# Patient Record
Sex: Female | Born: 1955 | Race: White | Hispanic: No | Marital: Married | Smoking: Current every day smoker
Health system: Southern US, Community
[De-identification: ages and names within clinical notes are randomized; demographics above are authoritative.]

## PROBLEM LIST (undated history)

## (undated) DIAGNOSIS — Z5189 Encounter for other specified aftercare: Secondary | ICD-10-CM

## (undated) DIAGNOSIS — F17200 Nicotine dependence, unspecified, uncomplicated: Secondary | ICD-10-CM

## (undated) DIAGNOSIS — M519 Unspecified thoracic, thoracolumbar and lumbosacral intervertebral disc disorder: Secondary | ICD-10-CM

## (undated) DIAGNOSIS — M5090 Cervical disc disorder, unspecified, unspecified cervical region: Secondary | ICD-10-CM

## (undated) DIAGNOSIS — G43909 Migraine, unspecified, not intractable, without status migrainosus: Secondary | ICD-10-CM

## (undated) DIAGNOSIS — J449 Chronic obstructive pulmonary disease, unspecified: Secondary | ICD-10-CM

## (undated) DIAGNOSIS — I509 Heart failure, unspecified: Secondary | ICD-10-CM

## (undated) DIAGNOSIS — N390 Urinary tract infection, site not specified: Secondary | ICD-10-CM

## (undated) DIAGNOSIS — C801 Malignant (primary) neoplasm, unspecified: Secondary | ICD-10-CM

## (undated) DIAGNOSIS — M1990 Unspecified osteoarthritis, unspecified site: Secondary | ICD-10-CM

## (undated) DIAGNOSIS — D649 Anemia, unspecified: Secondary | ICD-10-CM

## (undated) HISTORY — DX: Cervical disc disorder, unspecified, unspecified cervical region: M50.90

## (undated) HISTORY — DX: Unspecified thoracic, thoracolumbar and lumbosacral intervertebral disc disorder: M51.9

## (undated) HISTORY — DX: Urinary tract infection, site not specified: N39.0

## (undated) HISTORY — DX: Malignant (primary) neoplasm, unspecified: C80.1

## (undated) HISTORY — DX: Nicotine dependence, unspecified, uncomplicated: F17.200

## (undated) HISTORY — PX: TUBAL LIGATION: SHX77

## (undated) HISTORY — DX: Unspecified osteoarthritis, unspecified site: M19.90

## (undated) HISTORY — PX: OTHER SURGICAL HISTORY: SHX169

## (undated) HISTORY — DX: Chronic obstructive pulmonary disease, unspecified: J44.9

## (undated) HISTORY — PX: COLONOSCOPY: SHX174

## (undated) HISTORY — DX: Migraine, unspecified, not intractable, without status migrainosus: G43.909

## (undated) HISTORY — DX: Encounter for other specified aftercare: Z51.89

## (undated) HISTORY — DX: Anemia, unspecified: D64.9

---
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
## 1984-11-26 HISTORY — PX: ABDOMINAL HYSTERECTOMY: SHX81

## 1991-11-27 HISTORY — PX: CERVICAL DISCECTOMY: SHX98

## 2009-09-21 ENCOUNTER — Ambulatory Visit: Payer: Self-pay | Admitting: Physician Assistant

## 2012-01-04 DIAGNOSIS — Z5181 Encounter for therapeutic drug level monitoring: Secondary | ICD-10-CM | POA: Diagnosis not present

## 2012-01-04 DIAGNOSIS — M961 Postlaminectomy syndrome, not elsewhere classified: Secondary | ICD-10-CM | POA: Diagnosis not present

## 2012-01-04 DIAGNOSIS — IMO0002 Reserved for concepts with insufficient information to code with codable children: Secondary | ICD-10-CM | POA: Diagnosis not present

## 2012-01-04 DIAGNOSIS — Z79899 Other long term (current) drug therapy: Secondary | ICD-10-CM | POA: Diagnosis not present

## 2012-02-29 DIAGNOSIS — M961 Postlaminectomy syndrome, not elsewhere classified: Secondary | ICD-10-CM | POA: Diagnosis not present

## 2012-04-30 DIAGNOSIS — M961 Postlaminectomy syndrome, not elsewhere classified: Secondary | ICD-10-CM | POA: Diagnosis not present

## 2012-04-30 DIAGNOSIS — IMO0002 Reserved for concepts with insufficient information to code with codable children: Secondary | ICD-10-CM | POA: Diagnosis not present

## 2012-06-02 DIAGNOSIS — G43719 Chronic migraine without aura, intractable, without status migrainosus: Secondary | ICD-10-CM | POA: Diagnosis not present

## 2012-06-02 DIAGNOSIS — R259 Unspecified abnormal involuntary movements: Secondary | ICD-10-CM | POA: Diagnosis not present

## 2012-06-02 DIAGNOSIS — M5412 Radiculopathy, cervical region: Secondary | ICD-10-CM | POA: Diagnosis not present

## 2012-06-02 DIAGNOSIS — R209 Unspecified disturbances of skin sensation: Secondary | ICD-10-CM | POA: Diagnosis not present

## 2012-06-06 DIAGNOSIS — M5020 Other cervical disc displacement, unspecified cervical region: Secondary | ICD-10-CM | POA: Diagnosis not present

## 2012-06-24 DIAGNOSIS — M961 Postlaminectomy syndrome, not elsewhere classified: Secondary | ICD-10-CM | POA: Diagnosis not present

## 2012-06-24 DIAGNOSIS — Z79899 Other long term (current) drug therapy: Secondary | ICD-10-CM | POA: Diagnosis not present

## 2012-06-24 DIAGNOSIS — Z5181 Encounter for therapeutic drug level monitoring: Secondary | ICD-10-CM | POA: Diagnosis not present

## 2012-06-24 DIAGNOSIS — IMO0002 Reserved for concepts with insufficient information to code with codable children: Secondary | ICD-10-CM | POA: Diagnosis not present

## 2012-07-01 DIAGNOSIS — R209 Unspecified disturbances of skin sensation: Secondary | ICD-10-CM | POA: Diagnosis not present

## 2012-07-01 DIAGNOSIS — M5412 Radiculopathy, cervical region: Secondary | ICD-10-CM | POA: Diagnosis not present

## 2012-07-01 DIAGNOSIS — R259 Unspecified abnormal involuntary movements: Secondary | ICD-10-CM | POA: Diagnosis not present

## 2012-07-01 DIAGNOSIS — G43719 Chronic migraine without aura, intractable, without status migrainosus: Secondary | ICD-10-CM | POA: Diagnosis not present

## 2012-08-22 DIAGNOSIS — M67919 Unspecified disorder of synovium and tendon, unspecified shoulder: Secondary | ICD-10-CM | POA: Diagnosis not present

## 2012-08-22 DIAGNOSIS — R894 Abnormal immunological findings in specimens from other organs, systems and tissues: Secondary | ICD-10-CM | POA: Diagnosis not present

## 2012-08-22 DIAGNOSIS — M719 Bursopathy, unspecified: Secondary | ICD-10-CM | POA: Diagnosis not present

## 2012-08-22 DIAGNOSIS — R209 Unspecified disturbances of skin sensation: Secondary | ICD-10-CM | POA: Diagnosis not present

## 2012-08-22 DIAGNOSIS — Z23 Encounter for immunization: Secondary | ICD-10-CM | POA: Diagnosis not present

## 2012-08-22 DIAGNOSIS — R259 Unspecified abnormal involuntary movements: Secondary | ICD-10-CM | POA: Diagnosis not present

## 2012-08-25 DIAGNOSIS — M961 Postlaminectomy syndrome, not elsewhere classified: Secondary | ICD-10-CM | POA: Diagnosis not present

## 2012-08-25 DIAGNOSIS — IMO0002 Reserved for concepts with insufficient information to code with codable children: Secondary | ICD-10-CM | POA: Diagnosis not present

## 2012-10-01 DIAGNOSIS — G250 Essential tremor: Secondary | ICD-10-CM | POA: Diagnosis not present

## 2012-10-01 DIAGNOSIS — G252 Other specified forms of tremor: Secondary | ICD-10-CM | POA: Diagnosis not present

## 2012-10-20 DIAGNOSIS — M961 Postlaminectomy syndrome, not elsewhere classified: Secondary | ICD-10-CM | POA: Diagnosis not present

## 2012-10-20 DIAGNOSIS — IMO0002 Reserved for concepts with insufficient information to code with codable children: Secondary | ICD-10-CM | POA: Diagnosis not present

## 2012-10-29 DIAGNOSIS — IMO0002 Reserved for concepts with insufficient information to code with codable children: Secondary | ICD-10-CM | POA: Diagnosis not present

## 2012-10-29 DIAGNOSIS — Z885 Allergy status to narcotic agent status: Secondary | ICD-10-CM | POA: Diagnosis not present

## 2012-10-29 DIAGNOSIS — Z79899 Other long term (current) drug therapy: Secondary | ICD-10-CM | POA: Diagnosis not present

## 2012-10-29 DIAGNOSIS — Z888 Allergy status to other drugs, medicaments and biological substances status: Secondary | ICD-10-CM | POA: Diagnosis not present

## 2012-10-29 DIAGNOSIS — I10 Essential (primary) hypertension: Secondary | ICD-10-CM | POA: Diagnosis not present

## 2012-10-29 DIAGNOSIS — Z880 Allergy status to penicillin: Secondary | ICD-10-CM | POA: Diagnosis not present

## 2012-10-29 DIAGNOSIS — M5137 Other intervertebral disc degeneration, lumbosacral region: Secondary | ICD-10-CM | POA: Diagnosis not present

## 2012-12-15 DIAGNOSIS — M545 Low back pain: Secondary | ICD-10-CM | POA: Diagnosis not present

## 2012-12-15 DIAGNOSIS — M961 Postlaminectomy syndrome, not elsewhere classified: Secondary | ICD-10-CM | POA: Diagnosis not present

## 2012-12-15 DIAGNOSIS — IMO0002 Reserved for concepts with insufficient information to code with codable children: Secondary | ICD-10-CM | POA: Diagnosis not present

## 2013-02-09 DIAGNOSIS — Z79899 Other long term (current) drug therapy: Secondary | ICD-10-CM | POA: Diagnosis not present

## 2013-02-09 DIAGNOSIS — M961 Postlaminectomy syndrome, not elsewhere classified: Secondary | ICD-10-CM | POA: Diagnosis not present

## 2013-04-06 DIAGNOSIS — R209 Unspecified disturbances of skin sensation: Secondary | ICD-10-CM | POA: Diagnosis not present

## 2013-04-06 DIAGNOSIS — R259 Unspecified abnormal involuntary movements: Secondary | ICD-10-CM | POA: Diagnosis not present

## 2013-04-06 DIAGNOSIS — M5412 Radiculopathy, cervical region: Secondary | ICD-10-CM | POA: Diagnosis not present

## 2013-04-07 DIAGNOSIS — IMO0002 Reserved for concepts with insufficient information to code with codable children: Secondary | ICD-10-CM | POA: Diagnosis not present

## 2013-04-07 DIAGNOSIS — M5030 Other cervical disc degeneration, unspecified cervical region: Secondary | ICD-10-CM | POA: Diagnosis not present

## 2013-04-07 DIAGNOSIS — M961 Postlaminectomy syndrome, not elsewhere classified: Secondary | ICD-10-CM | POA: Diagnosis not present

## 2013-04-07 DIAGNOSIS — M545 Low back pain: Secondary | ICD-10-CM | POA: Diagnosis not present

## 2013-04-27 DIAGNOSIS — M5412 Radiculopathy, cervical region: Secondary | ICD-10-CM | POA: Diagnosis not present

## 2013-04-30 DIAGNOSIS — Z888 Allergy status to other drugs, medicaments and biological substances status: Secondary | ICD-10-CM | POA: Diagnosis not present

## 2013-04-30 DIAGNOSIS — Z880 Allergy status to penicillin: Secondary | ICD-10-CM | POA: Diagnosis not present

## 2013-04-30 DIAGNOSIS — IMO0002 Reserved for concepts with insufficient information to code with codable children: Secondary | ICD-10-CM | POA: Diagnosis not present

## 2013-04-30 DIAGNOSIS — M5030 Other cervical disc degeneration, unspecified cervical region: Secondary | ICD-10-CM | POA: Diagnosis not present

## 2013-04-30 DIAGNOSIS — M961 Postlaminectomy syndrome, not elsewhere classified: Secondary | ICD-10-CM | POA: Diagnosis not present

## 2013-06-02 DIAGNOSIS — M961 Postlaminectomy syndrome, not elsewhere classified: Secondary | ICD-10-CM | POA: Diagnosis not present

## 2013-06-02 DIAGNOSIS — M545 Low back pain: Secondary | ICD-10-CM | POA: Diagnosis not present

## 2013-06-02 DIAGNOSIS — M5030 Other cervical disc degeneration, unspecified cervical region: Secondary | ICD-10-CM | POA: Diagnosis not present

## 2013-06-02 DIAGNOSIS — E039 Hypothyroidism, unspecified: Secondary | ICD-10-CM | POA: Diagnosis not present

## 2013-06-12 DIAGNOSIS — R259 Unspecified abnormal involuntary movements: Secondary | ICD-10-CM | POA: Diagnosis not present

## 2013-06-12 DIAGNOSIS — E079 Disorder of thyroid, unspecified: Secondary | ICD-10-CM | POA: Diagnosis not present

## 2013-06-12 DIAGNOSIS — M5412 Radiculopathy, cervical region: Secondary | ICD-10-CM | POA: Diagnosis not present

## 2013-06-12 DIAGNOSIS — R209 Unspecified disturbances of skin sensation: Secondary | ICD-10-CM | POA: Diagnosis not present

## 2013-06-12 DIAGNOSIS — G43719 Chronic migraine without aura, intractable, without status migrainosus: Secondary | ICD-10-CM | POA: Diagnosis not present

## 2013-07-28 DIAGNOSIS — M5030 Other cervical disc degeneration, unspecified cervical region: Secondary | ICD-10-CM | POA: Diagnosis not present

## 2013-07-28 DIAGNOSIS — E039 Hypothyroidism, unspecified: Secondary | ICD-10-CM | POA: Diagnosis not present

## 2013-07-28 DIAGNOSIS — M961 Postlaminectomy syndrome, not elsewhere classified: Secondary | ICD-10-CM | POA: Diagnosis not present

## 2013-07-28 DIAGNOSIS — M545 Low back pain: Secondary | ICD-10-CM | POA: Diagnosis not present

## 2013-08-04 ENCOUNTER — Encounter: Payer: Self-pay | Admitting: Internal Medicine

## 2013-08-04 ENCOUNTER — Ambulatory Visit (INDEPENDENT_AMBULATORY_CARE_PROVIDER_SITE_OTHER): Payer: Medicare Other | Admitting: Internal Medicine

## 2013-08-04 VITALS — BP 120/88 | HR 111 | Temp 98.1°F | Ht 63.0 in | Wt 94.0 lb

## 2013-08-04 DIAGNOSIS — Z23 Encounter for immunization: Secondary | ICD-10-CM | POA: Diagnosis not present

## 2013-08-04 DIAGNOSIS — M5090 Cervical disc disorder, unspecified, unspecified cervical region: Secondary | ICD-10-CM

## 2013-08-04 DIAGNOSIS — F17200 Nicotine dependence, unspecified, uncomplicated: Secondary | ICD-10-CM

## 2013-08-04 DIAGNOSIS — G43909 Migraine, unspecified, not intractable, without status migrainosus: Secondary | ICD-10-CM | POA: Insufficient documentation

## 2013-08-04 DIAGNOSIS — M519 Unspecified thoracic, thoracolumbar and lumbosacral intervertebral disc disorder: Secondary | ICD-10-CM | POA: Insufficient documentation

## 2013-08-04 DIAGNOSIS — R634 Abnormal weight loss: Secondary | ICD-10-CM | POA: Diagnosis not present

## 2013-08-04 NOTE — Addendum Note (Signed)
Addended by: Sueanne Margarita on: 08/04/2013 02:23 PM   Modules accepted: Orders

## 2013-08-04 NOTE — Progress Notes (Signed)
Subjective:    Patient ID: Rachel Butler, female    DOB: October 08, 1956, 57 y.o.   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 Does other personal care  Is a smoker Is considering stopping Has feeling of swelling in feet Discussed stopping--husband also smokes  Weight has been going down Had been 115-125# in the past Down 8# in past month Thyroid has been checked 2 months ago Does have some GI symptoms--- history of gluten sensitivity in her family  History of migraines Diminished from the past Neurologist does follow her for this  No current outpatient prescriptions on file prior to visit.   No current facility-administered medications on file prior to visit.    Allergies  Allergen Reactions  . Endoxcin [Lidocaine-Menthol]   . Nubain [Nalbuphine Hcl]   . Penicillins   . Robaxin [Methocarbamol]     Past Medical History  Diagnosis Date  . Nicotine dependence   . Cervical disc disease   . Lumbar disc disease   . Migraines     Past Surgical History  Procedure Laterality Date  . Abdominal hysterectomy  1986    right tube and ovary removed--then laparoscopy after for ?retained cyst?  . Tonsillectomy and adenoidectomy  1970  . Cesarean section  [id number]  . Cervical discectomy  1993    C5-6    Family History  Problem Relation Age of Onset  . Cancer Mother   . COPD Father   . Heart disease Father   . Hypertension Father   . Thyroid disease Other   . Cancer Other     mother's side  . Heart disease Other     Dad's side    History   Social History  . Marital Status: Married    Spouse Name: N/A     Number of Children: 2  . Years of Education: N/A   Occupational History  . Company secretary ---retired   . 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 Smoking status: Current Every Day Smoker  . Smokeless tobacco: Never Used  . Alcohol Use: No  . Drug Use: No  . Sexual Activity: Not on file   Other Topics Concern  . Not on file   Social History Narrative  . No narrative on file   Review of Systems  Constitutional: Positive for unexpected weight change. Negative for fatigue.  HENT: Positive for congestion. Negative for hearing loss.        Full dentures  Respiratory: Negative for cough and shortness of breath.        Gets congested feeling in her chest--uses humidifier  Cardiovascular: Positive for palpitations. Negative for chest pain.       Notes racing heart ~4-5 PM. Brief and resolves on its own  Gastrointestinal: Positive for abdominal pain. Negative for constipation and blood in stool.       Gets bloating at times  Genitourinary: Negative for dysuria and difficulty urinating.       Does do breast exam---doesn't want mammogram Frequent UTIs in the past  Musculoskeletal: Positive for back pain and arthralgias.  Neurological: Positive for dizziness, weakness  and headaches. Negative for syncope.       Leg weakness if stands for a long time  Psychiatric/Behavioral: Negative for sleep disturbance and dysphoric mood. The patient is not nervous/anxious.        Objective:   Physical Exam  Constitutional: She is oriented to person, place, and time. She appears well-developed.  slim  HENT:  Mouth/Throat: Oropharynx is clear and moist. No oropharyngeal exudate.  Eyes: Conjunctivae and EOM are normal. Pupils are equal, round, and reactive to light.  Neck: Normal range of motion. Neck supple. No thyromegaly present.  Cardiovascular: Normal rate, regular rhythm, normal heart sounds and intact distal pulses.  Exam reveals no gallop.   No murmur  heard. Pulmonary/Chest: Effort normal and breath sounds normal. No respiratory distress. She has no wheezes. She has no rales.  Abdominal: Soft. There is no tenderness.  Genitourinary:  No breast masses or tenderness  Musculoskeletal: She exhibits no edema and no tenderness.  Lymphadenopathy:    She has no cervical adenopathy.    She has no axillary adenopathy.       Right: No inguinal adenopathy present.       Left: No inguinal adenopathy present.  Neurological: She is alert and oriented to person, place, and time.  Skin: No rash noted. No erythema.  Psychiatric: She has a normal mood and affect. Her behavior is normal.          Assessment & Plan:

## 2013-08-04 NOTE — Assessment & Plan Note (Signed)
More disabled after recent fall Seeing neurologist

## 2013-08-04 NOTE — Patient Instructions (Addendum)
Please call 1-800-QUIT NOW to get help with stopping smoking. You can try a nicotine patch every day when you stop---start with 21mg  and if you are doing well, you can slowly reduce the dose over 3-4 months. If you get any urges to smoke, DON"T!! Instead try a nicotine lozenge or gum.  Please try daily supplements with boost or ensure. You can add ice cream to improve the taste if needed. I recommend 2 cans a day to see if your weight will go up.  Please get past 1 year of records from Drs [place] and [address]

## 2013-08-04 NOTE — Assessment & Plan Note (Signed)
Counseled about 5 minutes Discussed plan

## 2013-08-04 NOTE — Assessment & Plan Note (Signed)
Feels okay No HSM or nodes Is a smoker but no change ?topiramate affecting appetite  Recent labs by other doctors---will try to get those records Check for sprue since GI symptoms and FH Discussed adding supplements Recheck soon Consider referral for colonoscopy, consider mammo, etc

## 2013-08-04 NOTE — Assessment & Plan Note (Signed)
Reasonable pain control Sees specialist

## 2013-08-05 LAB — CELIAC DISEASE ANTIBODY SCREEN
Antigliadin Abs, IgA: 2 units (ref 0–19)
IgA/Immunoglobulin A, Serum: 144 mg/dL (ref 91–414)

## 2013-08-17 DIAGNOSIS — G43719 Chronic migraine without aura, intractable, without status migrainosus: Secondary | ICD-10-CM | POA: Diagnosis not present

## 2013-08-17 DIAGNOSIS — IMO0002 Reserved for concepts with insufficient information to code with codable children: Secondary | ICD-10-CM | POA: Diagnosis not present

## 2013-08-17 DIAGNOSIS — R209 Unspecified disturbances of skin sensation: Secondary | ICD-10-CM | POA: Diagnosis not present

## 2013-08-17 DIAGNOSIS — E538 Deficiency of other specified B group vitamins: Secondary | ICD-10-CM | POA: Diagnosis not present

## 2013-08-17 DIAGNOSIS — G589 Mononeuropathy, unspecified: Secondary | ICD-10-CM | POA: Diagnosis not present

## 2013-08-17 DIAGNOSIS — R259 Unspecified abnormal involuntary movements: Secondary | ICD-10-CM | POA: Diagnosis not present

## 2013-08-21 ENCOUNTER — Encounter: Payer: Self-pay | Admitting: Internal Medicine

## 2013-09-10 ENCOUNTER — Ambulatory Visit: Payer: Medicare Other | Admitting: Internal Medicine

## 2013-09-11 ENCOUNTER — Ambulatory Visit (INDEPENDENT_AMBULATORY_CARE_PROVIDER_SITE_OTHER): Payer: Medicare Other | Admitting: Internal Medicine

## 2013-09-11 ENCOUNTER — Encounter: Payer: Self-pay | Admitting: Internal Medicine

## 2013-09-11 VITALS — BP 138/80 | HR 91 | Wt 93.0 lb

## 2013-09-11 DIAGNOSIS — R634 Abnormal weight loss: Secondary | ICD-10-CM | POA: Diagnosis not present

## 2013-09-11 MED ORDER — PROMETHAZINE HCL 25 MG PO TABS: 12.5000 mg | ORAL_TABLET | Freq: Three times a day (TID) | ORAL | Status: DC | PRN

## 2013-09-11 NOTE — Patient Instructions (Signed)
Please try chocolate boost or ensure mixed with regular chocolate ice cream---take in between meals or at evening meal if your appetite is down then. Please consider having your main meal at lunchtime

## 2013-09-11 NOTE — Progress Notes (Signed)
  Subjective:    Patient ID: Rachel Butler, female    DOB: Sep 10, 1956, 57 y.o.   MRN: [id number]  HPI Here with husband again  Has lost another pound Has nerve conduction test and low back ultrasound scheduled soon (?coccygeal contusion causing the pain since fall?)  Is eating "normally" Will get nausea 1-2 days per week and gets anorexia or vomiting Not clearly related to exacerbations of her pain--tends to be in evening  Has been on the topiramate for over a year She has cut the dose down Not clear this is the reason for the weight loss  Current Outpatient Prescriptions on File Prior to Visit  Medication Sig Dispense Refill  . fentaNYL (DURAGESIC - DOSED MCG/HR) 75 MCG/HR Place 1 patch onto the skin every 3 (three) days.      Marland Kitchen gabapentin (NEURONTIN) 800 MG tablet Take 800 mg by mouth 4 (four) times daily as needed.      Marland Kitchen HYDROcodone-acetaminophen (NORCO/VICODIN) 5-325 MG per tablet Take 1 tablet by mouth 3 (three) times daily as needed for pain.      Marland Kitchen tiZANidine (ZANAFLEX) 4 MG tablet Take 4 mg by mouth 3 (three) times daily. & 3 tablets at bedtime      . topiramate (TOPAMAX) 25 MG capsule Take 50 mg by mouth 2 (two) times daily.       No current facility-administered medications on file prior to visit.    Allergies  Allergen Reactions  . Endoxcin [Lidocaine-Menthol]   . Nubain [Nalbuphine Hcl]   . Penicillins   . Robaxin [Methocarbamol]     Past Medical History  Diagnosis Date  . Nicotine dependence   . Cervical disc disease   . Lumbar disc disease   . Migraines     Past Surgical History  Procedure Laterality Date  . Abdominal hysterectomy  1986    right tube and ovary removed--then laparoscopy after for ?retained cyst?  . Tonsillectomy and adenoidectomy  1970  . Cesarean section  [id number]  . Cervical discectomy  1993    C5-6    Family History  Problem Relation Age of Onset  . Cancer Mother   . COPD Father   . Heart disease Father   . Hypertension  Father   . Thyroid disease Other   . Cancer Other     mother's side  . Heart disease Other     Dad's side    History   Social History  . Marital Status: Married    Spouse Name: N/A    Number of Children: 2  . Years of Education: N/A   Occupational History  . Company secretary ---retired   . 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 Smoking status: Current Every Day Smoker  . Smokeless tobacco: Never Used  . Alcohol Use: No  . Drug Use: No  . Sexual Activity: Not on file   Other Topics Concern  . Not on file   Social History Narrative   Wendi Maya daughter   Review of Systems Sleeps okay Doesn't feel depressed    Objective:   Physical Exam  Constitutional: She appears well-developed. No distress.  Psychiatric: She has a normal mood and affect. Her behavior is normal.          Assessment & Plan:

## 2013-09-11 NOTE — Assessment & Plan Note (Addendum)
Fairly stable Discussed going out to eat--- she tends to eat better May want to transfer biggest meal to lunch time Not clear that the topiramate is causing anorexia Discussed stopping smoking---she isn't ready Rx for phenergan given to settle stomach prn  Could consider mirtazapine Will hold off since no other indication for use Add ensure--she has but hasn't used them (add with ice cream)

## 2013-09-15 DIAGNOSIS — M79609 Pain in unspecified limb: Secondary | ICD-10-CM | POA: Diagnosis not present

## 2013-09-15 DIAGNOSIS — IMO0002 Reserved for concepts with insufficient information to code with codable children: Secondary | ICD-10-CM | POA: Diagnosis not present

## 2013-09-15 DIAGNOSIS — G589 Mononeuropathy, unspecified: Secondary | ICD-10-CM | POA: Diagnosis not present

## 2013-09-15 DIAGNOSIS — M533 Sacrococcygeal disorders, not elsewhere classified: Secondary | ICD-10-CM | POA: Diagnosis not present

## 2013-09-22 DIAGNOSIS — M5030 Other cervical disc degeneration, unspecified cervical region: Secondary | ICD-10-CM | POA: Diagnosis not present

## 2013-09-22 DIAGNOSIS — Z79899 Other long term (current) drug therapy: Secondary | ICD-10-CM | POA: Diagnosis not present

## 2013-09-22 DIAGNOSIS — M961 Postlaminectomy syndrome, not elsewhere classified: Secondary | ICD-10-CM | POA: Diagnosis not present

## 2013-09-22 DIAGNOSIS — M545 Low back pain: Secondary | ICD-10-CM | POA: Diagnosis not present

## 2013-09-22 DIAGNOSIS — Z5181 Encounter for therapeutic drug level monitoring: Secondary | ICD-10-CM | POA: Diagnosis not present

## 2013-09-22 DIAGNOSIS — E039 Hypothyroidism, unspecified: Secondary | ICD-10-CM | POA: Diagnosis not present

## 2013-09-23 DIAGNOSIS — K8020 Calculus of gallbladder without cholecystitis without obstruction: Secondary | ICD-10-CM | POA: Diagnosis not present

## 2013-09-23 DIAGNOSIS — M5126 Other intervertebral disc displacement, lumbar region: Secondary | ICD-10-CM | POA: Diagnosis not present

## 2013-10-29 DIAGNOSIS — M533 Sacrococcygeal disorders, not elsewhere classified: Secondary | ICD-10-CM | POA: Diagnosis not present

## 2013-10-29 DIAGNOSIS — I700 Atherosclerosis of aorta: Secondary | ICD-10-CM | POA: Diagnosis not present

## 2013-10-29 DIAGNOSIS — M899 Disorder of bone, unspecified: Secondary | ICD-10-CM | POA: Diagnosis not present

## 2013-11-17 DIAGNOSIS — IMO0002 Reserved for concepts with insufficient information to code with codable children: Secondary | ICD-10-CM | POA: Diagnosis not present

## 2013-11-17 DIAGNOSIS — M961 Postlaminectomy syndrome, not elsewhere classified: Secondary | ICD-10-CM | POA: Diagnosis not present

## 2013-11-17 DIAGNOSIS — M5030 Other cervical disc degeneration, unspecified cervical region: Secondary | ICD-10-CM | POA: Diagnosis not present

## 2013-11-17 DIAGNOSIS — M810 Age-related osteoporosis without current pathological fracture: Secondary | ICD-10-CM | POA: Diagnosis not present

## 2013-11-17 DIAGNOSIS — E039 Hypothyroidism, unspecified: Secondary | ICD-10-CM | POA: Diagnosis not present

## 2013-12-11 DIAGNOSIS — M5412 Radiculopathy, cervical region: Secondary | ICD-10-CM | POA: Diagnosis not present

## 2013-12-11 DIAGNOSIS — G43719 Chronic migraine without aura, intractable, without status migrainosus: Secondary | ICD-10-CM | POA: Diagnosis not present

## 2013-12-11 DIAGNOSIS — M533 Sacrococcygeal disorders, not elsewhere classified: Secondary | ICD-10-CM | POA: Diagnosis not present

## 2013-12-11 DIAGNOSIS — G589 Mononeuropathy, unspecified: Secondary | ICD-10-CM | POA: Diagnosis not present

## 2013-12-11 DIAGNOSIS — R259 Unspecified abnormal involuntary movements: Secondary | ICD-10-CM | POA: Diagnosis not present

## 2014-01-14 DIAGNOSIS — IMO0002 Reserved for concepts with insufficient information to code with codable children: Secondary | ICD-10-CM | POA: Diagnosis not present

## 2014-01-14 DIAGNOSIS — E039 Hypothyroidism, unspecified: Secondary | ICD-10-CM | POA: Diagnosis not present

## 2014-01-14 DIAGNOSIS — M5030 Other cervical disc degeneration, unspecified cervical region: Secondary | ICD-10-CM | POA: Diagnosis not present

## 2014-01-14 DIAGNOSIS — M961 Postlaminectomy syndrome, not elsewhere classified: Secondary | ICD-10-CM | POA: Diagnosis not present

## 2014-03-09 ENCOUNTER — Encounter: Payer: Self-pay | Admitting: Internal Medicine

## 2014-03-09 ENCOUNTER — Ambulatory Visit (INDEPENDENT_AMBULATORY_CARE_PROVIDER_SITE_OTHER): Payer: Medicare Other | Admitting: Internal Medicine

## 2014-03-09 ENCOUNTER — Telehealth: Payer: Self-pay | Admitting: Internal Medicine

## 2014-03-09 VITALS — BP 130/80 | HR 96 | Temp 98.2°F | Wt 90.0 lb

## 2014-03-09 DIAGNOSIS — M5090 Cervical disc disorder, unspecified, unspecified cervical region: Secondary | ICD-10-CM

## 2014-03-09 DIAGNOSIS — R634 Abnormal weight loss: Secondary | ICD-10-CM | POA: Diagnosis not present

## 2014-03-09 MED ORDER — AMITRIPTYLINE HCL 25 MG PO TABS: 25.0000 mg | ORAL_TABLET | Freq: Every day | ORAL | Status: DC

## 2014-03-09 NOTE — Assessment & Plan Note (Signed)
Weight down even more Discussed meds for this---has taken amitriptylline in past so will try this instead of mirtazapine Referral to nutritionist

## 2014-03-09 NOTE — Progress Notes (Signed)
Subjective:    Patient ID: Rachel Butler, female    DOB: April 30, 1956, 58 y.o.   MRN: [id number]  HPI Here with husband  [address] twice--hit head Feels her balance is off Has seen the neurologist and pain specialist since then but still no answers Legs are better---able to lift them some better  Eats, feels okay--then will just vomit without warning (only occasionally) Has used a few of the phenergan  No chest pain  No SOB  Still not ready to stop smoking  Current Outpatient Prescriptions on File Prior to Visit  Medication Sig Dispense Refill  . fentaNYL (DURAGESIC - DOSED MCG/HR) 75 MCG/HR Place 1 patch onto the skin every 3 (three) days.      Marland Kitchen gabapentin (NEURONTIN) 800 MG tablet Take 800 mg by mouth 4 (four) times daily as needed.      Marland Kitchen HYDROcodone-acetaminophen (NORCO/VICODIN) 5-325 MG per tablet Take 1 tablet by mouth 3 (three) times daily as needed for pain.      . promethazine (PHENERGAN) 25 MG tablet Take 0.5-1 tablets (12.5-25 mg total) by mouth 3 (three) times daily as needed for nausea.  30 tablet  0  . tiZANidine (ZANAFLEX) 4 MG tablet Take 4 mg by mouth 3 (three) times daily. & 3 tablets at bedtime      . topiramate (TOPAMAX) 25 MG capsule Take 25 mg by mouth at bedtime.        No current facility-administered medications on file prior to visit.    Allergies  Allergen Reactions  . Endoxcin [Lidocaine-Menthol]   . Nubain [Nalbuphine Hcl]   . Penicillins   . Robaxin [Methocarbamol]     Past Medical History  Diagnosis Date  . Nicotine dependence   . Cervical disc disease   . Lumbar disc disease   . Migraines     Past Surgical History  Procedure Laterality Date  . Abdominal hysterectomy  1986    right tube and ovary removed--then laparoscopy after for ?retained cyst?  . Tonsillectomy and adenoidectomy  1970  . Cesarean section  [id number]  . Cervical discectomy  1993    C5-6    Family History  Problem Relation Age of Onset  . Cancer Mother   . COPD  Father   . Heart disease Father   . Hypertension Father   . Thyroid disease Other   . Cancer Other     mother's side  . Heart disease Other     Dad's side    History   Social History  . Marital Status: Married    Spouse Name: N/A    Number of Children: 2  . Years of Education: N/A   Occupational History  . Social research officer, government ---retired   . 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 Smoking status: Current Every Day Smoker  . Smokeless tobacco: Never Used  . Alcohol Use: No  . Drug Use: No  . Sexual Activity: Not on file   Other Topics Concern  . Not on file   Social History Narrative   [name] daughter   Review of Systems Sleeps okay with all the meds    Objective:   Physical Exam  Constitutional: She appears well-developed. No distress.  Neck: Normal range of motion. Neck supple. No thyromegaly present.  Cardiovascular: Normal rate, regular rhythm and normal heart sounds.  Exam reveals no gallop.   No murmur heard. Pulmonary/Chest: Effort normal and breath sounds normal. No respiratory distress.  She has no wheezes. She has no rales.  Abdominal: Soft. Bowel sounds are normal. She exhibits no distension. There is no tenderness. There is no rebound and no guarding.  Musculoskeletal: She exhibits no edema and no tenderness.  Lymphadenopathy:    She has no cervical adenopathy.          Assessment & Plan:

## 2014-03-09 NOTE — Progress Notes (Signed)
Pre visit review using our clinic review tool, if applicable. No additional management support is needed unless otherwise documented below in the visit note. 

## 2014-03-09 NOTE — Patient Instructions (Signed)
Please start the amitriptylline at 25mg  at bedtime. If your appetite isn't better after 1 week, increase to 50mg  (2 tabs). If you are tired in the morning on this, cut back on the tizanidine at night

## 2014-03-09 NOTE — Telephone Encounter (Signed)
Relevant patient education assigned to patient using Emmi. ° °

## 2014-03-09 NOTE — Assessment & Plan Note (Signed)
Ongoing care by neurologist and pain doctor

## 2014-03-12 ENCOUNTER — Ambulatory Visit: Payer: Medicare Other | Admitting: Internal Medicine

## 2014-03-12 DIAGNOSIS — M545 Low back pain, unspecified: Secondary | ICD-10-CM | POA: Diagnosis not present

## 2014-03-12 DIAGNOSIS — Z5181 Encounter for therapeutic drug level monitoring: Secondary | ICD-10-CM | POA: Diagnosis not present

## 2014-03-12 DIAGNOSIS — M961 Postlaminectomy syndrome, not elsewhere classified: Secondary | ICD-10-CM | POA: Diagnosis not present

## 2014-03-12 DIAGNOSIS — M5030 Other cervical disc degeneration, unspecified cervical region: Secondary | ICD-10-CM | POA: Diagnosis not present

## 2014-03-12 DIAGNOSIS — Z79899 Other long term (current) drug therapy: Secondary | ICD-10-CM | POA: Diagnosis not present

## 2014-03-12 DIAGNOSIS — IMO0002 Reserved for concepts with insufficient information to code with codable children: Secondary | ICD-10-CM | POA: Diagnosis not present

## 2014-03-25 ENCOUNTER — Ambulatory Visit: Payer: Self-pay | Admitting: Internal Medicine

## 2014-03-25 DIAGNOSIS — Z713 Dietary counseling and surveillance: Secondary | ICD-10-CM | POA: Diagnosis not present

## 2014-03-25 DIAGNOSIS — R627 Adult failure to thrive: Secondary | ICD-10-CM | POA: Diagnosis not present

## 2014-03-26 ENCOUNTER — Ambulatory Visit: Payer: Self-pay | Admitting: Internal Medicine

## 2014-03-26 DIAGNOSIS — Z713 Dietary counseling and surveillance: Secondary | ICD-10-CM | POA: Diagnosis not present

## 2014-03-26 DIAGNOSIS — R627 Adult failure to thrive: Secondary | ICD-10-CM | POA: Diagnosis not present

## 2014-04-26 ENCOUNTER — Ambulatory Visit: Payer: Self-pay | Admitting: Internal Medicine

## 2014-05-14 DIAGNOSIS — M5030 Other cervical disc degeneration, unspecified cervical region: Secondary | ICD-10-CM | POA: Diagnosis not present

## 2014-05-14 DIAGNOSIS — Z79899 Other long term (current) drug therapy: Secondary | ICD-10-CM | POA: Diagnosis not present

## 2014-05-14 DIAGNOSIS — IMO0002 Reserved for concepts with insufficient information to code with codable children: Secondary | ICD-10-CM | POA: Diagnosis not present

## 2014-05-14 DIAGNOSIS — M810 Age-related osteoporosis without current pathological fracture: Secondary | ICD-10-CM | POA: Diagnosis not present

## 2014-05-14 DIAGNOSIS — M961 Postlaminectomy syndrome, not elsewhere classified: Secondary | ICD-10-CM | POA: Diagnosis not present

## 2014-06-08 ENCOUNTER — Encounter: Payer: Self-pay | Admitting: Internal Medicine

## 2014-06-08 ENCOUNTER — Ambulatory Visit (INDEPENDENT_AMBULATORY_CARE_PROVIDER_SITE_OTHER): Payer: Medicare Other | Admitting: Internal Medicine

## 2014-06-08 VITALS — BP 110/78 | HR 97 | Temp 97.5°F | Ht 63.0 in | Wt 90.0 lb

## 2014-06-08 DIAGNOSIS — R634 Abnormal weight loss: Secondary | ICD-10-CM | POA: Diagnosis not present

## 2014-06-08 MED ORDER — PROMETHAZINE HCL 25 MG PO TABS: 12.5000 mg | ORAL_TABLET | Freq: Three times a day (TID) | ORAL | Status: DC | PRN

## 2014-06-08 MED ORDER — AMITRIPTYLINE HCL 50 MG PO TABS: 50.0000 mg | ORAL_TABLET | Freq: Every day | ORAL | Status: DC

## 2014-06-08 NOTE — Progress Notes (Signed)
Pre visit review using our clinic review tool, if applicable. No additional management support is needed unless otherwise documented below in the visit note. 

## 2014-06-08 NOTE — Progress Notes (Signed)
   Subjective:    Patient ID: Rachel Butler, female    DOB: Nov 26, 1956, 58 y.o.   MRN: [id number]  HPI Here with husband  Still on the amitriptylline Did go for the medical nutrition consultation---got some good tips (change to whole milk, act immediately if hungry to get snack)  Weight is stable---no further weight loss  Current Outpatient Prescriptions on File Prior to Visit  Medication Sig Dispense Refill  . fentaNYL (DURAGESIC - DOSED MCG/HR) 75 MCG/HR Place 1 patch onto the skin every 3 (three) days.      Marland Kitchen gabapentin (NEURONTIN) 800 MG tablet Take 800 mg by mouth 4 (four) times daily as needed.      Marland Kitchen HYDROcodone-acetaminophen (NORCO/VICODIN) 5-325 MG per tablet Take 1 tablet by mouth 3 (three) times daily as needed for pain.      . promethazine (PHENERGAN) 25 MG tablet Take 0.5-1 tablets (12.5-25 mg total) by mouth 3 (three) times daily as needed for nausea.  30 tablet  0  . tiZANidine (ZANAFLEX) 4 MG tablet Take 4 mg by mouth 3 (three) times daily. & 3 tablets at bedtime       No current facility-administered medications on file prior to visit.    Allergies  Allergen Reactions  . Endoxcin [Lidocaine-Menthol]   . Nubain [Nalbuphine Hcl]   . Penicillins   . Robaxin [Methocarbamol]     Past Medical History  Diagnosis Date  . Nicotine dependence   . Cervical disc disease   . Lumbar disc disease   . Migraines     Past Surgical History  Procedure Laterality Date  . Abdominal hysterectomy  1986    right tube and ovary removed--then laparoscopy after for ?retained cyst?  . Tonsillectomy and adenoidectomy  1970  . Cesarean section  [id number]  . Cervical discectomy  1993    C5-6    Family History  Problem Relation Age of Onset  . Cancer Mother   . COPD Father   . Heart disease Father   . Hypertension Father   . Thyroid disease Other   . Cancer Other     mother's side  . Heart disease Other     Dad's side    History   Social History  . Marital Status:  Married    Spouse Name: N/A    Number of Children: 2  . Years of Education: N/A   Occupational History  . Social research officer, government ---retired   . 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 Smoking status: Current Every Day Smoker  . Smokeless tobacco: Never Used  . Alcohol Use: No  . Drug Use: No  . Sexual Activity: Not on file   Other Topics Concern  . Not on file   Social History Narrative   [name] daughter   Review of Systems Still feels a little distant with some of her pain meds Satisfied with the pain control Still some unsteadiness--husband supervises showers, etc    Objective:   Physical Exam  Constitutional: No distress.  Psychiatric: She has a normal mood and affect. Her behavior is normal.          Assessment & Plan:

## 2014-06-08 NOTE — Assessment & Plan Note (Signed)
No further loss Will continue the amitriptylline Consider marinol--would want the pain clinic to approve or prescribe for her Occasionally needs the phenergan--will refill

## 2014-06-09 ENCOUNTER — Telehealth: Payer: Self-pay | Admitting: Internal Medicine

## 2014-06-09 NOTE — Telephone Encounter (Signed)
Relevant patient education assigned to patient using Emmi. ° °

## 2014-06-21 ENCOUNTER — Other Ambulatory Visit: Payer: Self-pay | Admitting: Internal Medicine

## 2014-07-12 DIAGNOSIS — Z79899 Other long term (current) drug therapy: Secondary | ICD-10-CM | POA: Diagnosis not present

## 2014-07-12 DIAGNOSIS — IMO0002 Reserved for concepts with insufficient information to code with codable children: Secondary | ICD-10-CM | POA: Diagnosis not present

## 2014-07-12 DIAGNOSIS — M961 Postlaminectomy syndrome, not elsewhere classified: Secondary | ICD-10-CM | POA: Diagnosis not present

## 2014-07-12 DIAGNOSIS — M5030 Other cervical disc degeneration, unspecified cervical region: Secondary | ICD-10-CM | POA: Diagnosis not present

## 2014-07-28 ENCOUNTER — Telehealth: Payer: Self-pay | Admitting: Internal Medicine

## 2014-07-28 NOTE — Telephone Encounter (Signed)
Pts husband stopped by to drop off form to request a prescription for a chairlift for wife. They are having a chair lift installed and company said that if they can get a rx then they would get a significant discount. Left for in inbox.

## 2014-07-28 NOTE — Telephone Encounter (Signed)
Form on your desk  

## 2014-07-29 ENCOUNTER — Other Ambulatory Visit: Payer: Self-pay | Admitting: Internal Medicine

## 2014-07-29 NOTE — Telephone Encounter (Signed)
I notified patient's husband that prescription is ready and he'll pick it up this afternoon.  Patient's husband was aware it's for the sales tax.

## 2014-07-29 NOTE — Telephone Encounter (Signed)
Rx written They are only eligible for exemption from sales tax

## 2014-07-29 NOTE — Telephone Encounter (Signed)
Okay #30 x 1 

## 2014-07-29 NOTE — Telephone Encounter (Signed)
rx sent to pharmacy by e-script  

## 2014-07-29 NOTE — Telephone Encounter (Signed)
Ok to fill 

## 2014-08-20 ENCOUNTER — Other Ambulatory Visit: Payer: Self-pay | Admitting: Internal Medicine

## 2014-08-25 NOTE — Telephone Encounter (Signed)
Spoke with Rachel Butler.  She states she does not need a refill on the phenergan, that she has plenty.  This was an automatic refill request sent from the pharmacy.  Refill denied.

## 2014-08-25 NOTE — Telephone Encounter (Signed)
Find out what is going on that she is using it so often--it was not expected for her to be taking it multiple times every day

## 2014-08-25 NOTE — Telephone Encounter (Signed)
#  30 x 1 rx last written on 07/29/14, next appt is 12/21/13 for a cpx.

## 2014-08-26 NOTE — Telephone Encounter (Signed)
Okay, thanks

## 2014-10-08 ENCOUNTER — Other Ambulatory Visit: Payer: Self-pay | Admitting: Internal Medicine

## 2014-10-08 DIAGNOSIS — M5417 Radiculopathy, lumbosacral region: Secondary | ICD-10-CM | POA: Diagnosis not present

## 2014-10-08 DIAGNOSIS — M961 Postlaminectomy syndrome, not elsewhere classified: Secondary | ICD-10-CM | POA: Diagnosis not present

## 2014-10-08 DIAGNOSIS — Z79899 Other long term (current) drug therapy: Secondary | ICD-10-CM | POA: Diagnosis not present

## 2014-10-08 DIAGNOSIS — M5030 Other cervical disc degeneration, unspecified cervical region: Secondary | ICD-10-CM | POA: Diagnosis not present

## 2014-10-08 DIAGNOSIS — Z5181 Encounter for therapeutic drug level monitoring: Secondary | ICD-10-CM | POA: Diagnosis not present

## 2014-10-08 DIAGNOSIS — E039 Hypothyroidism, unspecified: Secondary | ICD-10-CM | POA: Diagnosis not present

## 2014-12-05 ENCOUNTER — Encounter: Payer: Self-pay | Admitting: Internal Medicine

## 2014-12-05 ENCOUNTER — Emergency Department (HOSPITAL_COMMUNITY): Payer: Medicare Other

## 2014-12-05 ENCOUNTER — Encounter (HOSPITAL_COMMUNITY): Payer: Self-pay | Admitting: *Deleted

## 2014-12-05 ENCOUNTER — Inpatient Hospital Stay (HOSPITAL_COMMUNITY)
Admission: EM | Admit: 2014-12-05 | Discharge: 2014-12-08 | DRG: 871 | Disposition: A | Discharge status: Home / Self Care | Payer: Medicare Other | Attending: Internal Medicine | Admitting: Internal Medicine

## 2014-12-05 DIAGNOSIS — N390 Urinary tract infection, site not specified: Secondary | ICD-10-CM | POA: Diagnosis present

## 2014-12-05 DIAGNOSIS — R6520 Severe sepsis without septic shock: Secondary | ICD-10-CM | POA: Diagnosis present

## 2014-12-05 DIAGNOSIS — R401 Stupor: Secondary | ICD-10-CM | POA: Diagnosis not present

## 2014-12-05 DIAGNOSIS — E0781 Sick-euthyroid syndrome: Secondary | ICD-10-CM | POA: Diagnosis present

## 2014-12-05 DIAGNOSIS — E162 Hypoglycemia, unspecified: Secondary | ICD-10-CM | POA: Diagnosis present

## 2014-12-05 DIAGNOSIS — R4182 Altered mental status, unspecified: Secondary | ICD-10-CM | POA: Diagnosis not present

## 2014-12-05 DIAGNOSIS — E43 Unspecified severe protein-calorie malnutrition: Secondary | ICD-10-CM | POA: Diagnosis present

## 2014-12-05 DIAGNOSIS — G43909 Migraine, unspecified, not intractable, without status migrainosus: Secondary | ICD-10-CM | POA: Diagnosis present

## 2014-12-05 DIAGNOSIS — M549 Dorsalgia, unspecified: Secondary | ICD-10-CM | POA: Diagnosis present

## 2014-12-05 DIAGNOSIS — E441 Mild protein-calorie malnutrition: Secondary | ICD-10-CM | POA: Insufficient documentation

## 2014-12-05 DIAGNOSIS — Z681 Body mass index (BMI) 19 or less, adult: Secondary | ICD-10-CM

## 2014-12-05 DIAGNOSIS — Z880 Allergy status to penicillin: Secondary | ICD-10-CM

## 2014-12-05 DIAGNOSIS — R7989 Other specified abnormal findings of blood chemistry: Secondary | ICD-10-CM

## 2014-12-05 DIAGNOSIS — R634 Abnormal weight loss: Secondary | ICD-10-CM | POA: Diagnosis not present

## 2014-12-05 DIAGNOSIS — Z79891 Long term (current) use of opiate analgesic: Secondary | ICD-10-CM | POA: Diagnosis not present

## 2014-12-05 DIAGNOSIS — Z886 Allergy status to analgesic agent status: Secondary | ICD-10-CM

## 2014-12-05 DIAGNOSIS — G894 Chronic pain syndrome: Secondary | ICD-10-CM | POA: Diagnosis present

## 2014-12-05 DIAGNOSIS — G9340 Encephalopathy, unspecified: Secondary | ICD-10-CM | POA: Diagnosis present

## 2014-12-05 DIAGNOSIS — R40243 Glasgow coma scale score 3-8: Secondary | ICD-10-CM | POA: Diagnosis not present

## 2014-12-05 DIAGNOSIS — E872 Acidosis: Secondary | ICD-10-CM | POA: Diagnosis present

## 2014-12-05 DIAGNOSIS — R778 Other specified abnormalities of plasma proteins: Secondary | ICD-10-CM

## 2014-12-05 DIAGNOSIS — G8929 Other chronic pain: Secondary | ICD-10-CM | POA: Diagnosis not present

## 2014-12-05 DIAGNOSIS — Z881 Allergy status to other antibiotic agents status: Secondary | ICD-10-CM

## 2014-12-05 DIAGNOSIS — F17210 Nicotine dependence, cigarettes, uncomplicated: Secondary | ICD-10-CM | POA: Diagnosis present

## 2014-12-05 DIAGNOSIS — Z7982 Long term (current) use of aspirin: Secondary | ICD-10-CM

## 2014-12-05 DIAGNOSIS — J9602 Acute respiratory failure with hypercapnia: Secondary | ICD-10-CM | POA: Diagnosis present

## 2014-12-05 DIAGNOSIS — I059 Rheumatic mitral valve disease, unspecified: Secondary | ICD-10-CM | POA: Diagnosis not present

## 2014-12-05 DIAGNOSIS — F17200 Nicotine dependence, unspecified, uncomplicated: Secondary | ICD-10-CM | POA: Diagnosis present

## 2014-12-05 DIAGNOSIS — A419 Sepsis, unspecified organism: Principal | ICD-10-CM | POA: Diagnosis present

## 2014-12-05 LAB — I-STAT TROPONIN, ED: Troponin i, poc: 0.04 ng/mL (ref 0.00–0.08)

## 2014-12-05 LAB — URINE MICROSCOPIC-ADD ON

## 2014-12-05 LAB — GLUCOSE, CAPILLARY
Glucose-Capillary: 60 mg/dL — ABNORMAL LOW (ref 70–99)
Glucose-Capillary: 213 mg/dL — ABNORMAL HIGH (ref 70–99)

## 2014-12-05 LAB — CBC WITH DIFFERENTIAL/PLATELET
Basophils Absolute: 0 10*3/uL (ref 0.0–0.1)
Basophils Relative: 0 % (ref 0–1)
Eosinophils Absolute: 0 10*3/uL (ref 0.0–0.7)
Eosinophils Relative: 0 % (ref 0–5)
HCT: 45 % (ref 36.0–46.0)
Hemoglobin: 14.7 g/dL (ref 12.0–15.0)
LYMPHS ABS: 1.1 10*3/uL (ref 0.7–4.0)
Lymphocytes Relative: 6 % — ABNORMAL LOW (ref 12–46)
MCH: 29.3 pg (ref 26.0–34.0)
MCHC: 32.7 g/dL (ref 30.0–36.0)
MCV: 89.8 fL (ref 78.0–100.0)
Monocytes Absolute: 2 10*3/uL — ABNORMAL HIGH (ref 0.1–1.0)
Monocytes Relative: 11 % (ref 3–12)
NEUTROS ABS: 15.4 10*3/uL — AB (ref 1.7–7.7)
Neutrophils Relative %: 83 % — ABNORMAL HIGH (ref 43–77)
Platelets: 260 10*3/uL (ref 150–400)
RBC: 5.01 MIL/uL (ref 3.87–5.11)
RDW: 13.9 % (ref 11.5–15.5)
WBC: 18.5 10*3/uL — ABNORMAL HIGH (ref 4.0–10.5)

## 2014-12-05 LAB — I-STAT VENOUS BLOOD GAS, ED
ACID-BASE EXCESS: 4 mmol/L — AB (ref 0.0–2.0)
BICARBONATE: 35.2 meq/L — AB (ref 20.0–24.0)
O2 Saturation: 73 %
PH VEN: 7.227 — AB (ref 7.250–7.300)
PO2 VEN: 48 mmHg — AB (ref 30.0–45.0)
TCO2: 38 mmol/L (ref 0–100)
pCO2, Ven: 84.6 mmHg (ref 45.0–50.0)

## 2014-12-05 LAB — COMPREHENSIVE METABOLIC PANEL
ALT: 21 U/L (ref 0–35)
AST: 54 U/L — ABNORMAL HIGH (ref 0–37)
Albumin: 3.7 g/dL (ref 3.5–5.2)
Alkaline Phosphatase: 99 U/L (ref 39–117)
Anion gap: 14 (ref 5–15)
BILIRUBIN TOTAL: 0.6 mg/dL (ref 0.3–1.2)
BUN: 7 mg/dL (ref 6–23)
CHLORIDE: 97 meq/L (ref 96–112)
CO2: 29 mmol/L (ref 19–32)
Calcium: 9.3 mg/dL (ref 8.4–10.5)
Creatinine, Ser: 0.93 mg/dL (ref 0.50–1.10)
GFR calc Af Amer: 78 mL/min — ABNORMAL LOW (ref >90)
GFR calc non Af Amer: 67 mL/min — ABNORMAL LOW (ref >90)
GLUCOSE: 51 mg/dL — AB (ref 70–99)
Potassium: 3.5 mmol/L (ref 3.5–5.1)
Sodium: 140 mmol/L (ref 135–145)
TOTAL PROTEIN: 6.6 g/dL (ref 6.0–8.3)

## 2014-12-05 LAB — CBG MONITORING, ED
GLUCOSE-CAPILLARY: 65 mg/dL — AB (ref 70–99)
GLUCOSE-CAPILLARY: 191 mg/dL — AB (ref 70–99)
Glucose-Capillary: 62 mg/dL — ABNORMAL LOW (ref 70–99)

## 2014-12-05 LAB — URINALYSIS, ROUTINE W REFLEX MICROSCOPIC
GLUCOSE, UA: NEGATIVE mg/dL
Hgb urine dipstick: NEGATIVE
Ketones, ur: 15 mg/dL — AB
Nitrite: POSITIVE — AB
PROTEIN: 30 mg/dL — AB
Specific Gravity, Urine: 1.026 (ref 1.005–1.030)
UROBILINOGEN UA: 1 mg/dL (ref 0.0–1.0)
pH: 5 (ref 5.0–8.0)

## 2014-12-05 LAB — RAPID URINE DRUG SCREEN, HOSP PERFORMED
Amphetamines: NOT DETECTED
Barbiturates: NOT DETECTED
Benzodiazepines: NOT DETECTED
Cocaine: NOT DETECTED
Opiates: POSITIVE — AB
Tetrahydrocannabinol: NOT DETECTED

## 2014-12-05 LAB — I-STAT CG4 LACTIC ACID, ED: LACTIC ACID, VENOUS: 3.85 mmol/L — AB (ref 0.5–2.2)

## 2014-12-05 LAB — TROPONIN I
Troponin I: 0.06 ng/mL — ABNORMAL HIGH (ref <0.031)
Troponin I: 0.29 ng/mL — ABNORMAL HIGH (ref <0.031)

## 2014-12-05 LAB — TSH: TSH: 0.22 u[IU]/mL — ABNORMAL LOW (ref 0.350–4.500)

## 2014-12-05 MED ORDER — DEXTROSE 50 % IV SOLN
50.0000 mL | Freq: Once | INTRAVENOUS | Status: AC
  Administered 2014-12-05: 50 mL via INTRAVENOUS
  Filled 2014-12-05: qty 50

## 2014-12-05 MED ORDER — ENOXAPARIN SODIUM 40 MG/0.4ML ~~LOC~~ SOLN
40.0000 mg | SUBCUTANEOUS | Status: DC
  Administered 2014-12-05 – 2014-12-07 (×3): 40 mg via SUBCUTANEOUS
  Filled 2014-12-05 (×4): qty 0.4

## 2014-12-05 MED ORDER — ACETAMINOPHEN 650 MG RE SUPP: 650.0000 mg | Freq: Four times a day (QID) | RECTAL | Status: DC | PRN

## 2014-12-05 MED ORDER — ACETAMINOPHEN 325 MG PO TABS: 650.0000 mg | ORAL_TABLET | Freq: Four times a day (QID) | ORAL | Status: DC | PRN

## 2014-12-05 MED ORDER — HYDROCODONE-ACETAMINOPHEN 5-325 MG PO TABS
1.0000 | ORAL_TABLET | Freq: Three times a day (TID) | ORAL | Status: DC | PRN
  Administered 2014-12-06 – 2014-12-08 (×5): 1 via ORAL
  Filled 2014-12-05 (×6): qty 1

## 2014-12-05 MED ORDER — ONDANSETRON HCL 4 MG PO TABS: 4.0000 mg | ORAL_TABLET | Freq: Four times a day (QID) | ORAL | Status: DC | PRN

## 2014-12-05 MED ORDER — DEXTROSE 50 % IV SOLN
INTRAVENOUS | Status: AC
  Administered 2014-12-05: 21:00:00
  Filled 2014-12-05: qty 50

## 2014-12-05 MED ORDER — ONDANSETRON HCL 4 MG/2ML IJ SOLN: 4.0000 mg | Freq: Four times a day (QID) | INTRAMUSCULAR | Status: DC | PRN

## 2014-12-05 MED ORDER — CEFTRIAXONE SODIUM IN DEXTROSE 20 MG/ML IV SOLN
1.0000 g | INTRAVENOUS | Status: DC
  Administered 2014-12-06 – 2014-12-07 (×2): 1 g via INTRAVENOUS
  Filled 2014-12-05 (×3): qty 50

## 2014-12-05 MED ORDER — CEFTRIAXONE SODIUM 1 G IJ SOLR
1.0000 g | Freq: Once | INTRAMUSCULAR | Status: AC
  Administered 2014-12-05: 1 g via INTRAVENOUS
  Filled 2014-12-05: qty 10

## 2014-12-05 MED ORDER — KCL IN DEXTROSE-NACL 20-5-0.9 MEQ/L-%-% IV SOLN
INTRAVENOUS | Status: DC
  Administered 2014-12-05 – 2014-12-07 (×4): via INTRAVENOUS
  Filled 2014-12-05 (×7): qty 1000

## 2014-12-05 MED ORDER — POTASSIUM CHLORIDE IN NACL 20-0.9 MEQ/L-% IV SOLN
INTRAVENOUS | Status: DC
  Filled 2014-12-05 (×2): qty 1000

## 2014-12-05 MED ORDER — MULTI-VITAMIN/MINERALS PO TABS: 1.0000 | ORAL_TABLET | Freq: Every day | ORAL | Status: DC

## 2014-12-05 MED ORDER — SODIUM CHLORIDE 0.9 % IV BOLUS (SEPSIS)
1000.0000 mL | Freq: Once | INTRAVENOUS | Status: AC
  Administered 2014-12-05: 1000 mL via INTRAVENOUS

## 2014-12-05 MED ORDER — ADULT MULTIVITAMIN W/MINERALS CH
1.0000 | ORAL_TABLET | Freq: Every day | ORAL | Status: DC
  Administered 2014-12-06 – 2014-12-08 (×3): 1 via ORAL
  Filled 2014-12-05 (×3): qty 1

## 2014-12-05 MED ORDER — TIZANIDINE HCL 4 MG PO TABS
4.0000 mg | ORAL_TABLET | Freq: Three times a day (TID) | ORAL | Status: DC
  Administered 2014-12-05 – 2014-12-08 (×8): 4 mg via ORAL
  Filled 2014-12-05 (×10): qty 1

## 2014-12-05 NOTE — ED Notes (Signed)
CBG 65 

## 2014-12-05 NOTE — Progress Notes (Signed)
Pt taken off Bipap at this time. She is awake and stating she is not short of breath. Much more responsive at this point. Placed on 2L [place]. Family at bedside. RN aware. SATs 98%

## 2014-12-05 NOTE — ED Notes (Signed)
Attempted report 

## 2014-12-05 NOTE — ED Notes (Signed)
Rogue Bussing, NP aware that pt was taken off Bipap. States that it is ok and she can be placed back on Bipap if needed

## 2014-12-05 NOTE — ED Notes (Signed)
fentanyl patch removed from left upper back. MD aware

## 2014-12-05 NOTE — H&P (Signed)
[facility] History and Physical  Rachel Butler OMV:[id number] DOB: 05/29/1956 DOA: 12/05/2014  Referring physician: er PCP: Viviana Simpler, MD   Chief Complaint: found down by husband  HPI: Rachel Butler is a 59 y.o. female  Whose husband was unable to wake her up this morning. Per husband patient was at baseline last night. She is fairly chronically ill with a 50 pound weight loss in the last year. She walks with a cane. She has had multiple falls. Patient follows with a pain doctor.  Wears a fentanyl patch as well as takes Norco when necessary for her chronic pain. Upon arrival to the ER patient was arousable but sleepy. Patient is not up-to-date on colonoscopy were normal mammogram. Patient has had a hysterectomy so she states she does not have a uterus. In the ER patient was found to be in severe sepsis due to UTI. Her lactic acid was elevated at 3.85. UA was positive. ABG was also done that showed a decrease in her pH as well as an increase in her PCO2. Patient  continue to smoke. Patient denies chest pain but was found to have a mildly elevated troponin at 0.04. Head CT and chest x-ray were negative for an acute process. Blood cultures were done as well as urine culture. Patient was started on IV antibiotics and given IV fluid resuscitation. Patient is being admitted to the stepdown unit for close monitoring as well as BiPAP. Patient has shown improvement in the ER  Review of Systems:  All systems reviewed, negative unless stated above   Past Medical History  Diagnosis Date  . Nicotine dependence   . Cervical disc disease   . Lumbar disc disease   . Migraines    Past Surgical History  Procedure Laterality Date  . Abdominal hysterectomy  1986    right tube and ovary removed--then laparoscopy after for ?retained cyst?  . Tonsillectomy and adenoidectomy  1970  . Cesarean section  [id number]  . Cervical discectomy  1993    C5-6   Social History:  reports that she has  been smoking.  She has never used smokeless tobacco. She reports that she does not drink alcohol or use illicit drugs.  Allergies  Allergen Reactions  . Endoxcin [Lidocaine-Menthol]   . Nubain [Nalbuphine Hcl]   . Penicillins   . Robaxin [Methocarbamol]     Family History  Problem Relation Age of Onset  . Cancer Mother   . COPD Father   . Heart disease Father   . Hypertension Father   . Thyroid disease Other   . Cancer Other     mother's side  . Heart disease Other     Dad's side     Prior to Admission medications   Medication Sig Start Date End Date Taking? Authorizing Provider  amitriptyline (ELAVIL) 50 MG tablet Take 1 tablet (50 mg total) by mouth at bedtime. 06/08/14  Yes Venia Carbon, MD  Aspirin-Salicylamide-Caffeine [facility] HEADACHE POWDER PO) Take 1 Package by mouth 2 (two) times daily. Pain   Yes Historical Provider, MD  fentaNYL (DURAGESIC - DOSED MCG/HR) 75 MCG/HR Place 1 patch onto the skin every 3 (three) days.   Yes Historical Provider, MD  gabapentin (NEURONTIN) 800 MG tablet Take 800 mg by mouth 4 (four) times daily as needed.   Yes Historical Provider, MD  HYDROcodone-acetaminophen (NORCO/VICODIN) 5-325 MG per tablet Take 1 tablet by mouth 3 (three) times daily as needed for pain.   Yes Historical Provider, MD  Multiple Vitamins-Minerals (MULTIVITAMIN WITH MINERALS) tablet Take 1 tablet by mouth daily.   Yes Historical Provider, MD  promethazine (PHENERGAN) 25 MG tablet TAKE 0.5-1 TABLETS (12.5-25 MG TOTAL) BY MOUTH 3 (THREE) TIMES DAILY AS NEEDED FOR NAUSEA. 07/29/14  Yes Venia Carbon, MD  tiZANidine (ZANAFLEX) 4 MG tablet Take 4 mg by mouth 3 (three) times daily. & 3 tablets at bedtime   Yes Historical Provider, MD  amitriptyline (ELAVIL) 25 MG tablet TAKE 1 TO 2 TABLETS BY MOUTH AT BEDTIME Patient not taking: Reported on 12/05/2014 06/21/14   Venia Carbon, MD  amitriptyline (ELAVIL) 25 MG tablet TAKE 1 TO 2 TABLETS BY MOUTH AT BEDTIME Patient not taking:  Reported on 12/05/2014 10/08/14   Venia Carbon, MD   Physical Exam: Filed Vitals:   12/05/14 1322 12/05/14 1330 12/05/14 1400 12/05/14 1430  BP: 108/68 114/69 134/82 126/73  Pulse: 100 100 97 104  Temp:      TempSrc:      Resp: 9 11 17 19   SpO2: 100% 100% 100% 100%    Wt Readings from Last 3 Encounters:  06/08/14 40.824 kg (90 lb)  03/09/14 40.824 kg (90 lb)  09/11/13 42.185 kg (93 lb)    General:  Chronically ill, frail appearing white female Eyes: PERRL, normal lids, irises & conjunctiva ENT: grossly normal hearing, lips & tongue Neck: no LAD, masses or thyromegaly Cardiovascular: RRR, no m/r/g. No LE edema. Respiratory: dereased b/l Abdomen: soft, ntnd Skin: no rash or induration seen on limited exam Musculoskeletal: grossly normal tone BUE/BLE Psychiatric: grossly normal mood and affect, speech fluent and appropriate Neurologic: grossly non-focal.          Labs on Admission:  Basic Metabolic Panel:  Recent Labs Lab 12/05/14 1100  NA 140  K 3.5  CL 97  CO2 29  GLUCOSE 51*  BUN 7  CREATININE 0.93  CALCIUM 9.3   Liver Function Tests:  Recent Labs Lab 12/05/14 1100  AST 54*  ALT 21  ALKPHOS 99  BILITOT 0.6  PROT 6.6  ALBUMIN 3.7   No results for input(s): LIPASE, AMYLASE in the last 168 hours. No results for input(s): AMMONIA in the last 168 hours. CBC:  Recent Labs Lab 12/05/14 1100  WBC 18.5*  NEUTROABS 15.4*  HGB 14.7  HCT 45.0  MCV 89.8  PLT 260   Cardiac Enzymes:  Recent Labs Lab 12/05/14 1100  TROPONINI 0.06*    BNP (last 3 results) No results for input(s): PROBNP in the last 8760 hours. CBG:  Recent Labs Lab 12/05/14 1123  GLUCAP 62*    Radiological Exams on Admission: Dg Chest 2 View  12/05/2014   CLINICAL DATA:  Unresponsive, found at home.  EXAM: CHEST  2 VIEW  COMPARISON:  None.  FINDINGS: Normal cardiac and mediastinal contours. The lungs are hyperinflated. No large consolidative pulmonary opacity. No pleural  effusion or pneumothorax. Regional skeleton is unremarkable.  IMPRESSION: No acute cardiopulmonary process.   Electronically Signed   By: Lovey Newcomer M.D.   On: 12/05/2014 14:23   Ct Head Wo Contrast  12/05/2014   CLINICAL DATA:  Unresponsive, found by husband around 10 a.m., left-sided weakness  EXAM: CT HEAD WITHOUT CONTRAST  TECHNIQUE: Contiguous axial images were obtained from the base of the skull through the vertex without intravenous contrast.  COMPARISON:  None.  FINDINGS: There is no evidence of mass effect, midline shift, or extra-axial fluid collections. There is no evidence of a space-occupying lesion or intracranial hemorrhage.  There is no evidence of a cortical-based area of acute infarction.  The ventricles and sulci are appropriate for the patient's age. The basal cisterns are patent.  Visualized portions of the orbits are unremarkable. The visualized portions of the paranasal sinuses and mastoid air cells are unremarkable.  The osseous structures are unremarkable.  IMPRESSION: No acute intracranial pathology.   Electronically Signed   By: Kathreen Devoid   On: 12/05/2014 13:39    EKG: Independently reviewed. Sinus tachy  Assessment/Plan Active Problems:   Nicotine dependence   Abnormal weight loss   Altered mental status   UTI (lower urinary tract infection)   Severe sepsis without septic shock   Chronic pain  AMS- several sources possible: infection, hypoglycemia, CO2 narcosis  Severe sepsis due to urinary tract infection: Blood cultures 2, IV fluids, IV antibiotics, repeat lactic acid.  Patient will be monitored closely overnight in the stepdown unit  Tobacco abuse: Encouraged cessation  UTI: Culture pending, IV antibiotics next line  Acute respiratory failure with hypercapnia: BiPAP  50 pound weight loss: Patient is not up-to-date on colonoscopy nor mammogram. PCP has patient on antidepressants to help with weight gain. Once patient is more stable patient may need CT scan  of chest to look for malignancy.  Mild elevation in troponin will cycle cardiac enzymes  Hypoglycemia- D5 NS for now until eating  Leukocytosis- trend WBCs   Code Status: full DVT Prophylaxis: Family Communication: patient Disposition Plan: SDU overnight  Time spent: 65 min  Eulogio Bear [facility] Pager [phone]

## 2014-12-05 NOTE — ED Notes (Signed)
Patient transported to CT 

## 2014-12-05 NOTE — Progress Notes (Signed)
Pt was not on BIPAP at this time. She is on 2L [place] with sat's of 98%. Pt tolerating well at this time. RT will continue to monitor

## 2014-12-05 NOTE — ED Notes (Signed)
Dr. Vann at bedside 

## 2014-12-05 NOTE — ED Notes (Signed)
Per EMS- pt was found at home to be unresponsive by husband approx an hour ago. Pt was seen normal last night. Pt has possible weakness on the left side. Pt was noted to have bowel incontinence. Pt responses to voice and pain. Pt states "im tired".

## 2014-12-05 NOTE — ED Provider Notes (Signed)
CSN: [id number]     Arrival date & time 12/05/14  1046 History   First MD Initiated Contact with Patient 12/05/14 1050     Chief Complaint  Patient presents with  . Altered Mental Status     (Consider location/radiation/quality/duration/timing/severity/associated sxs/prior Treatment) Patient is a 59 y.o. female presenting with altered mental status.  Altered Mental Status Presenting symptoms: partial responsiveness   Severity:  Severe Most recent episode:  Yesterday Episode history:  Continuous Timing:  Constant Progression:  Unchanged Chronicity:  Recurrent Context comment:  Chronic narcotics for back pain, migraine   Past Medical History  Diagnosis Date  . Nicotine dependence   . Cervical disc disease   . Lumbar disc disease   . Migraines    Past Surgical History  Procedure Laterality Date  . Abdominal hysterectomy  1986    right tube and ovary removed--then laparoscopy after for ?retained cyst?  . Tonsillectomy and adenoidectomy  1970  . Cesarean section  [id number]  . Cervical discectomy  1993    C5-6   Family History  Problem Relation Age of Onset  . Cancer Mother   . COPD Father   . Heart disease Father   . Hypertension Father   . Thyroid disease Other   . Cancer Other     mother's side  . Heart disease Other     Dad's side   History  Substance Use Topics  . Smoking status: Current Every Day Smoker  . Smokeless tobacco: Never Used  . Alcohol Use: No   OB History    No data available     Review of Systems  Unable to perform ROS: Mental status change      Allergies  Endoxcin; Nubain; Penicillins; and Robaxin  Home Medications   Prior to Admission medications   Medication Sig Start Date End Date Taking? Authorizing Provider  amitriptyline (ELAVIL) 50 MG tablet Take 1 tablet (50 mg total) by mouth at bedtime. 06/08/14  Yes Venia Carbon, MD  Aspirin-Salicylamide-Caffeine [facility] HEADACHE POWDER PO) Take 1 Package by mouth 2 (two) times daily.  Pain   Yes Historical Provider, MD  fentaNYL (DURAGESIC - DOSED MCG/HR) 75 MCG/HR Place 1 patch onto the skin every 3 (three) days.   Yes Historical Provider, MD  gabapentin (NEURONTIN) 800 MG tablet Take 800 mg by mouth 4 (four) times daily as needed.   Yes Historical Provider, MD  HYDROcodone-acetaminophen (NORCO/VICODIN) 5-325 MG per tablet Take 1 tablet by mouth 3 (three) times daily as needed for pain.   Yes Historical Provider, MD  Multiple Vitamins-Minerals (MULTIVITAMIN WITH MINERALS) tablet Take 1 tablet by mouth daily.   Yes Historical Provider, MD  promethazine (PHENERGAN) 25 MG tablet TAKE 0.5-1 TABLETS (12.5-25 MG TOTAL) BY MOUTH 3 (THREE) TIMES DAILY AS NEEDED FOR NAUSEA. 07/29/14  Yes Venia Carbon, MD  tiZANidine (ZANAFLEX) 4 MG tablet Take 4 mg by mouth 3 (three) times daily. & 3 tablets at bedtime   Yes Historical Provider, MD  amitriptyline (ELAVIL) 25 MG tablet TAKE 1 TO 2 TABLETS BY MOUTH AT BEDTIME Patient not taking: Reported on 12/05/2014 06/21/14   Venia Carbon, MD  amitriptyline (ELAVIL) 25 MG tablet TAKE 1 TO 2 TABLETS BY MOUTH AT BEDTIME Patient not taking: Reported on 12/05/2014 10/08/14   Venia Carbon, MD   BP 159/88 mmHg  Pulse 94  Temp(Src) 99 F (37.2 C) (Oral)  Resp 15  Ht 5\' 3"  (1.6 m)  Wt 97 lb 10.6 oz (44.3 kg)  BMI 17.30 kg/m2  SpO2 96% Physical Exam  Constitutional: She appears well-developed and well-nourished.  HENT:  Head: Normocephalic and atraumatic.  Right Ear: External ear normal.  Left Ear: External ear normal.  Eyes: Conjunctivae and EOM are normal. Pupils are equal, round, and reactive to light.  Neck: Normal range of motion. Neck supple.  Cardiovascular: Normal rate, regular rhythm, normal heart sounds and intact distal pulses.   Pulmonary/Chest: Effort normal and breath sounds normal.  Abdominal: Soft. Bowel sounds are normal. There is no tenderness.  Musculoskeletal: Normal range of motion.  Neurological: She is alert. GCS eye  subscore is 4. GCS verbal subscore is 4. GCS motor subscore is 6.  MAE, no focal neuro deficits in context of limited exam  Skin: Skin is warm and dry.  Vitals reviewed.   ED Course  Procedures (including critical care time) Labs Review Labs Reviewed  CBC WITH DIFFERENTIAL - Abnormal; Notable for the following:    WBC 18.5 (*)    Neutrophils Relative % 83 (*)    Neutro Abs 15.4 (*)    Lymphocytes Relative 6 (*)    Monocytes Absolute 2.0 (*)    All other components within normal limits  COMPREHENSIVE METABOLIC PANEL - Abnormal; Notable for the following:    Glucose, Bld 51 (*)    AST 54 (*)    GFR calc non Af Amer 67 (*)    GFR calc Af Amer 78 (*)    All other components within normal limits  URINALYSIS, ROUTINE W REFLEX MICROSCOPIC - Abnormal; Notable for the following:    Color, Urine RED (*)    APPearance CLOUDY (*)    Bilirubin Urine MODERATE (*)    Ketones, ur 15 (*)    Protein, ur 30 (*)    Nitrite POSITIVE (*)    Leukocytes, UA SMALL (*)    All other components within normal limits  TROPONIN I - Abnormal; Notable for the following:    Troponin I 0.06 (*)    All other components within normal limits  URINE MICROSCOPIC-ADD ON - Abnormal; Notable for the following:    Bacteria, UA MANY (*)    Casts HYALINE CASTS (*)    All other components within normal limits  URINE RAPID DRUG SCREEN (HOSP PERFORMED) - Abnormal; Notable for the following:    Opiates POSITIVE (*)    All other components within normal limits  GLUCOSE, CAPILLARY - Abnormal; Notable for the following:    Glucose-Capillary 60 (*)    All other components within normal limits  GLUCOSE, CAPILLARY - Abnormal; Notable for the following:    Glucose-Capillary 213 (*)    All other components within normal limits  BASIC METABOLIC PANEL - Abnormal; Notable for the following:    Sodium 134 (*)    Potassium 3.4 (*)    BUN <5 (*)    Creatinine, Ser 0.40 (*)    Calcium 8.1 (*)    All other components within  normal limits  CBC - Abnormal; Notable for the following:    WBC 12.9 (*)    Hemoglobin 11.9 (*)    HCT 35.8 (*)    All other components within normal limits  TROPONIN I - Abnormal; Notable for the following:    Troponin I 0.29 (*)    All other components within normal limits  TROPONIN I - Abnormal; Notable for the following:    Troponin I 0.19 (*)    All other components within normal limits  TSH - Abnormal; Notable  for the following:    TSH 0.220 (*)    All other components within normal limits  GLUCOSE, CAPILLARY - Abnormal; Notable for the following:    Glucose-Capillary 154 (*)    All other components within normal limits  GLUCOSE, CAPILLARY - Abnormal; Notable for the following:    Glucose-Capillary 147 (*)    All other components within normal limits  IRON AND TIBC - Abnormal; Notable for the following:    TIBC 226 (*)    All other components within normal limits  TROPONIN I - Abnormal; Notable for the following:    Troponin I 0.15 (*)    All other components within normal limits  GLUCOSE, CAPILLARY - Abnormal; Notable for the following:    Glucose-Capillary 112 (*)    All other components within normal limits  GLUCOSE, CAPILLARY - Abnormal; Notable for the following:    Glucose-Capillary 102 (*)    All other components within normal limits  COMPREHENSIVE METABOLIC PANEL - Abnormal; Notable for the following:    Sodium 132 (*)    Potassium 3.2 (*)    Glucose, Bld 107 (*)    BUN <5 (*)    Creatinine, Ser 0.40 (*)    Calcium 8.3 (*)    Albumin 3.3 (*)    AST 50 (*)    Anion gap 4 (*)    All other components within normal limits  CBG MONITORING, ED - Abnormal; Notable for the following:    Glucose-Capillary 62 (*)    All other components within normal limits  I-STAT CG4 LACTIC ACID, ED - Abnormal; Notable for the following:    Lactic Acid, Venous 3.85 (*)    All other components within normal limits  I-STAT VENOUS BLOOD GAS, ED - Abnormal; Notable for the  following:    pH, Ven 7.227 (*)    pCO2, Ven 84.6 (*)    pO2, Ven 48.0 (*)    Bicarbonate 35.2 (*)    Acid-Base Excess 4.0 (*)    All other components within normal limits  CBG MONITORING, ED - Abnormal; Notable for the following:    Glucose-Capillary 65 (*)    All other components within normal limits  CBG MONITORING, ED - Abnormal; Notable for the following:    Glucose-Capillary 191 (*)    All other components within normal limits  CULTURE, BLOOD (ROUTINE X 2)  CULTURE, BLOOD (ROUTINE X 2)  MRSA PCR SCREENING  CLOSTRIDIUM DIFFICILE BY PCR  URINE CULTURE  T4, FREE  VITAMIN B12  FOLATE  VITAMIN D 25 HYDROXY  FERRITIN  HIV ANTIBODY (ROUTINE TESTING)  RETICULOCYTES  CBC  VITAMIN D 1,25 DIHYDROXY  CORTISOL  I-STAT TROPOININ, ED    Imaging Review Dg Chest 2 View  12/05/2014   CLINICAL DATA:  Unresponsive, found at home.  EXAM: CHEST  2 VIEW  COMPARISON:  None.  FINDINGS: Normal cardiac and mediastinal contours. The lungs are hyperinflated. No large consolidative pulmonary opacity. No pleural effusion or pneumothorax. Regional skeleton is unremarkable.  IMPRESSION: No acute cardiopulmonary process.   Electronically Signed   By: Lovey Newcomer M.D.   On: 12/05/2014 14:23   Ct Head Wo Contrast  12/05/2014   CLINICAL DATA:  Unresponsive, found by husband around 10 a.m., left-sided weakness  EXAM: CT HEAD WITHOUT CONTRAST  TECHNIQUE: Contiguous axial images were obtained from the base of the skull through the vertex without intravenous contrast.  COMPARISON:  None.  FINDINGS: There is no evidence of mass effect, midline shift, or extra-axial  fluid collections. There is no evidence of a space-occupying lesion or intracranial hemorrhage. There is no evidence of a cortical-based area of acute infarction.  The ventricles and sulci are appropriate for the patient's age. The basal cisterns are patent.  Visualized portions of the orbits are unremarkable. The visualized portions of the paranasal  sinuses and mastoid air cells are unremarkable.  The osseous structures are unremarkable.  IMPRESSION: No acute intracranial pathology.   Electronically Signed   By: Kathreen Devoid   On: 12/05/2014 13:39     EKG Interpretation   Date/Time:  Sunday December 05 2014 10:53:34 EST Ventricular Rate:  103 PR Interval:  123 QRS Duration: 77 QT Interval:  379 QTC Calculation: 496 R Axis:   55 Text Interpretation:  Sinus tachycardia Probable left atrial enlargement  Probable anteroseptal infarct, old Minimal ST depression, inferior leads  No old tracing to compare Confirmed by Debby Freiberg [phone]) on  12/05/2014 2:15:24 PM      CRITICAL CARE Performed by: Debby Freiberg   Total critical care time: 30 min  Critical care time was exclusive of separately billable procedures and treating other patients.  Critical care was necessary to treat or prevent imminent or life-threatening deterioration.  Critical care was time spent personally by me on the following activities: development of treatment plan with patient and/or surrogate as well as nursing, discussions with consultants, evaluation of patient's response to treatment, examination of patient, obtaining history from patient or surrogate, ordering and performing treatments and interventions, ordering and review of laboratory studies, ordering and review of radiographic studies, pulse oximetry and re-evaluation of patient's condition.  MDM   Final diagnoses:  Altered mental status    59 y.o. female with pertinent PMH of migraines, chronic back pain on narcotics presents with altered mental status from home.  No recent illness, per pt husband, pt was in normal state of health yesterday.  On arrival today, physical exam as above.  Pt drowsy, but arousable, with GCS 14.  She follows commands minimally, but moves all extremities.   Wu as above with likely multifactorical etiology.  Pt with SIRS criteria after leukocytosis resulted.  Bolus  given. Treated with rocephin for UTI and blood cultures drawn.  Admitted in stable condition to stepdown.  I have reviewed all laboratory and imaging studies if ordered as above  1. Altered mental status, unspecified altered mental status type   2. Altered mental status         Debby Freiberg, MD 12/07/14 1122

## 2014-12-06 DIAGNOSIS — G8929 Other chronic pain: Secondary | ICD-10-CM

## 2014-12-06 LAB — GLUCOSE, CAPILLARY
GLUCOSE-CAPILLARY: 154 mg/dL — AB (ref 70–99)
Glucose-Capillary: 147 mg/dL — ABNORMAL HIGH (ref 70–99)
Glucose-Capillary: 112 mg/dL — ABNORMAL HIGH (ref 70–99)
Glucose-Capillary: 102 mg/dL — ABNORMAL HIGH (ref 70–99)

## 2014-12-06 LAB — CBC
HCT: 35.8 % — ABNORMAL LOW (ref 36.0–46.0)
Hemoglobin: 11.9 g/dL — ABNORMAL LOW (ref 12.0–15.0)
MCH: 30 pg (ref 26.0–34.0)
MCHC: 33.2 g/dL (ref 30.0–36.0)
MCV: 90.2 fL (ref 78.0–100.0)
Platelets: 178 10*3/uL (ref 150–400)
RBC: 3.97 MIL/uL (ref 3.87–5.11)
RDW: 13.9 % (ref 11.5–15.5)
WBC: 12.9 10*3/uL — ABNORMAL HIGH (ref 4.0–10.5)

## 2014-12-06 LAB — BASIC METABOLIC PANEL
ANION GAP: 7 (ref 5–15)
BUN: <5 mg/dL — ABNORMAL LOW (ref 6–23)
CO2: 26 mmol/L (ref 19–32)
CREATININE: 0.4 mg/dL — AB (ref 0.50–1.10)
Calcium: 8.1 mg/dL — ABNORMAL LOW (ref 8.4–10.5)
Chloride: 101 mEq/L (ref 96–112)
GFR calc non Af Amer: >90 mL/min (ref >90)
Glucose, Bld: 93 mg/dL (ref 70–99)
Potassium: 3.4 mmol/L — ABNORMAL LOW (ref 3.5–5.1)
SODIUM: 134 mmol/L — AB (ref 135–145)

## 2014-12-06 LAB — VITAMIN B12: VITAMIN B 12: 550 pg/mL (ref 211–911)

## 2014-12-06 LAB — RETICULOCYTES
RBC.: 4.08 MIL/uL (ref 3.87–5.11)
RETIC COUNT ABSOLUTE: 73.4 10*3/uL (ref 19.0–186.0)
Retic Ct Pct: 1.8 % (ref 0.4–3.1)

## 2014-12-06 LAB — IRON AND TIBC
IRON: 47 ug/dL (ref 42–145)
Saturation Ratios: 21 % (ref 20–55)
TIBC: 226 ug/dL — ABNORMAL LOW (ref 250–470)
UIBC: 179 ug/dL (ref 125–400)

## 2014-12-06 LAB — CLOSTRIDIUM DIFFICILE BY PCR: Toxigenic C. Difficile by PCR: NEGATIVE

## 2014-12-06 LAB — TROPONIN I
TROPONIN I: 0.19 ng/mL — AB (ref <0.031)
Troponin I: 0.15 ng/mL — ABNORMAL HIGH (ref <0.031)

## 2014-12-06 LAB — FERRITIN: Ferritin: 50 ng/mL (ref 10–291)

## 2014-12-06 LAB — T4, FREE: Free T4: 0.86 ng/dL (ref 0.80–1.80)

## 2014-12-06 LAB — MRSA PCR SCREENING: MRSA BY PCR: NEGATIVE

## 2014-12-06 LAB — FOLATE: Folate: >20 ng/mL

## 2014-12-06 MED ORDER — BOOST / RESOURCE BREEZE PO LIQD
1.0000 | Freq: Three times a day (TID) | ORAL | Status: DC
  Administered 2014-12-06 – 2014-12-07 (×3): 1 via ORAL

## 2014-12-06 NOTE — Progress Notes (Signed)
OT Cancellation Note  Patient Details Name: Rachel Butler MRN: [id number] DOB: 04-09-1956   Cancelled Treatment:    Reason Eval/Treat Not Completed: Other (comment) Pt about to leave to transfer to 5w.    Benito Mccreedy OTR/L [phone] 12/06/2014, 3:35 PM

## 2014-12-06 NOTE — Progress Notes (Signed)
Patient trasfered from 3S to [phone] via wheelchair; alert and oriented x 4; no complaints of pain; IV in RH  running D5 0.5NS and KCl20@60cc /hr; skin intact, with discoloration on sacral area, blanchable. Orient patient to room and unit;  instructed how to use the call bell and  fall risk precautions. Will continue to monitor the patient.

## 2014-12-06 NOTE — Progress Notes (Signed)
Utilization review completed.  

## 2014-12-06 NOTE — Progress Notes (Addendum)
[place] TEAM 1 - Stepdown/ICU TEAM Progress Note  Rachel Butler GGE:[id number] DOB: 1956/10/04 DOA: 12/05/2014 PCP: Viviana Simpler, MD  Admit HPI / Brief Narrative: 59 y.o. female whose husband was unable to wake her up the morning of her presenation. Per husband patient was at baseline the preceding night. She is chronically ill with a 50 pound weight loss in the last year. She walks with a cane. She has had multiple falls. Patient follows with a pain doctor and wears a fentanyl patch as well as takes Norco when necessary for her chronic pain.   Upon arrival to the ER patient was arousable but sleepy.  She was felt to be in severe sepsis due to UTI. Her lactic acid was elevated at 3.85. UA was positive. ABG was also showed a decrease in her pH as well as an increase in her PCO2.  Head CT and chest x-ray were negative for an acute process. Blood cultures were done as well as urine culture. Patient was started on IV antibiotics and given IV fluid resuscitation.   HPI/Subjective: Pt is alert and conversant and walking w/ PT.  She denies focal complaints.  She reports "spine pain" but denies localized pain, stating it is "all up and down my spine."  She reports living a very sessile life since her coccyx fracture ~2years ago.  She denies taking extra doses of meds.    Assessment/Plan:  Acute hypercarbic resp failure Likely due to sedation > hypoventilation - now liberated from BIPAP and alert - no resp distress at this time whatsoever   UTI w/ sepsis  UA equivocal - order urine culture - follow - cont empiric abx  Altered mental status Appears to be due pain meds + TME related to UTI - minimize narcotics - complete metabolic w/u   Mild troponin elevation  No sx to suggests ACS - trend to normalization   FTT - 50 pound weight loss - underweight Body mass index is 17.3 kg/(m^2). not up-to-date on colonoscopy nor mammogram - check B12/folic acid - check adrenal axis - check TSH - PT/OT  consults - nutrition consult   Hypoglycemia  Likely due to infection + narcotics w/ sedation in setting of very poor intake - check adrenal axis  Chronic pain on chronic narcotics - cervical disk disease  As pain is diffuse, likely little utility to MRI imaging - follow and keep in mind   Tobacco abuse  CXR w/o findings - with constitutional sx so concerning for CA, will obtain CT of chest to r/o occult CA if thyroid w/u does not prove to be the etiology of her weight loss   Low TSH FT4 pending   Code Status: FULL Family Communication: no family present at time of exam Disposition Plan: transfer to med bed - PT/OT - complete metabolic w/u - follow mental status   Consultants: none  Procedures: none  Antibiotics: Ceftriaxone 1/10 >  DVT prophylaxis: lovenox  Objective: Blood pressure 153/92, pulse 98, temperature 98.4 F (36.9 C), temperature source Oral, resp. rate 19, height 5\' 3"  (1.6 m), weight 44.3 kg (97 lb 10.6 oz), SpO2 97 %.  Intake/Output Summary (Last 24 hours) at 12/06/14 1350 Last data filed at 12/06/14 1300  Gross per 24 hour  Intake 2241.25 ml  Output    550 ml  Net 1691.25 ml   Exam: General: No acute respiratory distress - cachectic  Lungs: Clear to auscultation bilaterally without wheezes or crackles Cardiovascular: tachycardic but regular - no gallup, rub,  or M Abdomen: Nontender, nondistended, soft, bowel sounds positive, no rebound, no ascites, no appreciable mass Extremities: No significant cyanosis, clubbing, or edema bilateral lower extremities  Data Reviewed: Basic Metabolic Panel:  Recent Labs Lab 12/05/14 1100 12/06/14 0234  NA 140 134*  K 3.5 3.4*  CL 97 101  CO2 29 26  GLUCOSE 51* 93  BUN 7 <5*  CREATININE 0.93 0.40*  CALCIUM 9.3 8.1*    Liver Function Tests:  Recent Labs Lab 12/05/14 1100  AST 54*  ALT 21  ALKPHOS 99  BILITOT 0.6  PROT 6.6  ALBUMIN 3.7   No results for input(s): LIPASE, AMYLASE in the last 168  hours. No results for input(s): AMMONIA in the last 168 hours.  Coags: No results for input(s): INR in the last 168 hours.  Invalid input(s): PT No results for input(s): APTT in the last 168 hours.  CBC:  Recent Labs Lab 12/05/14 1100 12/06/14 0234  WBC 18.5* 12.9*  NEUTROABS 15.4*  --   HGB 14.7 11.9*  HCT 45.0 35.8*  MCV 89.8 90.2  PLT 260 178    Cardiac Enzymes:  Recent Labs Lab 12/05/14 1100 12/05/14 2137 12/06/14 0234 12/06/14 0939  TROPONINI 0.06* 0.29* 0.19* 0.15*   CBG:  Recent Labs Lab 12/05/14 2045 12/06/14 12/06/14 0325 12/06/14 0732 12/06/14 1216  GLUCAP 213* 154* 147* 112* 102*    Recent Results (from the past 240 hour(s))  Culture, blood (routine x 2)     Status: None (Preliminary result)   Collection Time: 12/05/14  3:16 PM  Result Value Ref Range Status   Specimen Description BLOOD LEFT FOREARM  Final   Special Requests BOTTLES DRAWN AEROBIC ONLY 4CC  Final   Culture   Final           BLOOD CULTURE RECEIVED NO GROWTH TO DATE CULTURE WILL BE HELD FOR 5 DAYS BEFORE ISSUING A FINAL NEGATIVE REPORT Performed at [facility]    Report Status PENDING  Incomplete  Culture, blood (routine x 2)     Status: None (Preliminary result)   Collection Time: 12/05/14  3:22 PM  Result Value Ref Range Status   Specimen Description BLOOD LEFT HAND  Final   Special Requests BOTTLES DRAWN AEROBIC ONLY 4 CC  Final   Culture   Final           BLOOD CULTURE RECEIVED NO GROWTH TO DATE CULTURE WILL BE HELD FOR 5 DAYS BEFORE ISSUING A FINAL NEGATIVE REPORT Performed at [facility]    Report Status PENDING  Incomplete  MRSA PCR Screening     Status: None   Collection Time: 12/06/14  5:58 AM  Result Value Ref Range Status   MRSA by PCR NEGATIVE NEGATIVE Final    Comment:        The GeneXpert MRSA Assay (FDA approved for NASAL specimens only), is one component of a comprehensive MRSA colonization surveillance program. It is not intended to  diagnose MRSA infection nor to guide or monitor treatment for MRSA infections.      Studies:  Recent x-ray studies have been reviewed in detail by the Attending Physician  Scheduled Meds:  Scheduled Meds: . cefTRIAXone (ROCEPHIN)  IV  1 g Intravenous Q24H  . enoxaparin (LOVENOX) injection  40 mg Subcutaneous Q24H  . multivitamin with minerals  1 tablet Oral Daily  . tiZANidine  4 mg Oral TID    Time spent on care of this patient: 35 mins   MCCLUNG,JEFFREY T ,  MD   [facility] Office  [phone] Pager - Text Page per Shea Evans as per below:  On-Call/Text Page:      [email or web address]      password [email or web address]  If 7PM-7AM, please contact night-coverage [email or web address] Password [email or web address] 12/06/2014, 1:50 PM   LOS: 1 day

## 2014-12-06 NOTE — Progress Notes (Signed)
INITIAL NUTRITION ASSESSMENT  DOCUMENTATION CODES Per approved criteria  -Severe malnutrition in the context of chronic illness -Underweight   INTERVENTION:  Resource Breeze PO TID, each supplement provides 250 kcal and 9 grams of protein  NUTRITION DIAGNOSIS: Malnutrition related to inadequate oral intake as evidenced by severe depletion of muscle and subcutaneous fat mass.   Goal: Intake to meet >90% of estimated nutrition needs.  Monitor:  PO intake, labs, weight trend.  Reason for Assessment: MD Consult for poor PO intake  59 y.o. female  Admitting Dx: Severe sepsis due to UTI  ASSESSMENT: Patient admitted on 1/10 when husband unable to wake her up that morning. Found to have UTI in the ED.  Patient reports that she has seen a dietitian who taught her how to increase her intake of calories and protein throughout the day. She does not like Ensure or Boost supplements. She likes to drink [employer] if she does not eat well at meal time. Currently on clear liquids; will order Resource Breeze PO TID between meals to increase intake while on clear liquids. Once diet advanced can change supplement to [employer] mixed with whole milk.  Nutrition Focused Physical Exam:  Subcutaneous Fat:  Orbital Region: WNL Upper Arm Region: moderate-severe depletion Thoracic and Lumbar Region: NA  Muscle:  Temple Region: moderate depletion Clavicle Bone Region: severe depletion Clavicle and Acromion Bone Region: severe depletion Scapular Bone Region: NA Dorsal Hand: severe depletion Patellar Region: WNL Anterior Thigh Region: WNL Posterior Calf Region: WNL  Edema: none   Height: Ht Readings from Last 1 Encounters:  12/06/14 5\' 3"  (1.6 m)    Weight: Wt Readings from Last 1 Encounters:  12/06/14 97 lb 10.6 oz (44.3 kg)    Ideal Body Weight: 52.3 kg  % Ideal Body Weight: 85%  Wt Readings from Last 10 Encounters:  12/06/14 97 lb 10.6  oz (44.3 kg)  06/08/14 90 lb (40.824 kg)  03/09/14 90 lb (40.824 kg)  09/11/13 93 lb (42.185 kg)  08/04/13 94 lb (42.638 kg)    Usual Body Weight: 90 lb  % Usual Body Weight: 108%  BMI:  Body mass index is 17.3 kg/(m^2). Underweight  Estimated Nutritional Needs: Kcal: 1350-1550 Protein: 70-85 gm  Fluid: 1.5 L  Skin: intact  Diet Order: Diet clear liquid  EDUCATION NEEDS: -Education needs addressed   Intake/Output Summary (Last 24 hours) at 12/06/14 0939 Last data filed at 12/06/14 0600  Gross per 24 hour  Intake 1716.25 ml  Output    550 ml  Net 1166.25 ml    Last BM: 1/10   Labs:   Recent Labs Lab 12/05/14 1100 12/06/14 0234  NA 140 134*  K 3.5 3.4*  CL 97 101  CO2 29 26  BUN 7 <5*  CREATININE 0.93 0.40*  CALCIUM 9.3 8.1*  GLUCOSE 51* 93    CBG (last 3)   Recent Labs  12/06/14 12/06/14 0325 12/06/14 0732  GLUCAP 154* 147* 112*    Scheduled Meds: . cefTRIAXone (ROCEPHIN)  IV  1 g Intravenous Q24H  . enoxaparin (LOVENOX) injection  40 mg Subcutaneous Q24H  . multivitamin with minerals  1 tablet Oral Daily  . tiZANidine  4 mg Oral TID    Continuous Infusions: . dextrose 5 % and 0.9 % NaCl with KCl 20 mEq/L 75 mL/hr at 12/06/14 0600    Past Medical History  Diagnosis Date  . Nicotine dependence   . Cervical disc disease   . Lumbar disc disease   .  Migraines     Past Surgical History  Procedure Laterality Date  . Abdominal hysterectomy  1986    right tube and ovary removed--then laparoscopy after for ?retained cyst?  . Tonsillectomy and adenoidectomy  1970  . Cesarean section  [id number]  . Cervical discectomy  1993    C5-6    Molli Barrows, [place], LDN, [place] Pager [phone] After Hours Pager [phone]

## 2014-12-06 NOTE — Evaluation (Signed)
Physical Therapy Evaluation Patient Details Name: Rachel Butler MRN: [id number] DOB: 1956/04/30 Today's Date: 12/06/2014   History of Present Illness  59 y.o. female  Whose husband was unable to wake her up this morning. Per husband patient was at baseline last night. She is fairly chronically ill with a 50 pound weight loss in the last year. Pt has chronic pain and was found to be septic with UTI  Clinical Impression  Pt is a pleasant but frail appearing women who reportedly has been declining with all aspects of health for 2 years. Pt goes downstairs in the AM then spends the day in a recliner, bathes 1x/week and relies on spouse for everything else. Pt demonstrates lack of motivation in mobility and function but does state she wants to be able to travel 4 hrs to granddaughter's graduation this summer. Pt educated for need to increase activity, gait and HEP with encouragement to perform daily. Pt will benefit from acute therapy as well as HHPT to maximize strength, function and gait to decrease burden of care and increase quality of life.     Follow Up Recommendations Home health PT;Supervision for mobility/OOB    Equipment Recommendations  3in1 (PT)    Recommendations for Other Services       Precautions / Restrictions Precautions Precautions: Fall Precaution Comments: 2 falls in last year      Mobility  Bed Mobility Overal bed mobility: Modified Independent                Transfers Overall transfer level: Needs assistance   Transfers: Sit to/from Stand Sit to Stand: Supervision         General transfer comment: for safety secondary to frequent falls  Ambulation/Gait Ambulation/Gait assistance: Supervision Ambulation Distance (Feet): 300 Feet Assistive device: Rolling walker (2 wheeled) Gait Pattern/deviations: Step-through pattern;Decreased stride length   Gait velocity interpretation: Below normal speed for age/gender General Gait Details: cues for posture  and RW use. pt intiated gait with hand held assist but reaching out for environmental supports with other hand and switched to RW use  Stairs            Wheelchair Mobility    Modified Rankin (Stroke Patients Only)       Balance Overall balance assessment: Needs assistance   Sitting balance-Leahy Scale: Good       Standing balance-Leahy Scale: Poor Standing balance comment: 5 min static standing with hand held assist                             Pertinent Vitals/Pain Pain Assessment: 0-10 Pain Score: 5  Pain Location: entire back Pain Descriptors / Indicators: Constant Pain Intervention(s): Repositioned  HR 102-114 with gait 92-96% on RA with activity    Home Living Family/patient expects to be discharged to:: Private residence Living Arrangements: Spouse/significant other Available Help at Discharge: Family;Available 24 hours/day Type of Home: House Home Access: Stairs to enter   [employer] of Steps: 3 Home Layout: Two level Home Equipment: Cane - single point;Shower seat      Prior Function Level of Independence: Needs assistance   Gait / Transfers Assistance Needed: supervision for mobility with cane  ADL's / Homemaking Assistance Needed: supervision for bathing standing in shower, mod I with dressing sitting. Spouse and housekeeper due all homemaking        Hand Dominance        Extremity/Trunk Assessment   Upper Extremity Assessment: Generalized  weakness           Lower Extremity Assessment: Generalized weakness;RLE deficits/detail;LLE deficits/detail RLE Deficits / Details: hip flexion 3/5, knee flexion 4/5, knee extension 3/5 LLE Deficits / Details: hip flexion 3/5, knee flexion/extension 3/5  Cervical / Trunk Assessment: Normal (forward head)  Communication   Communication: No difficulties  Cognition Arousal/Alertness: Awake/alert Behavior During Therapy: Flat affect Overall Cognitive Status: Within  Functional Limits for tasks assessed                      General Comments      Exercises General Exercises - Lower Extremity Long Arc Quad: AROM;Seated;Both;15 reps Hip ABduction/ADduction: AROM;Seated;Both;15 reps Hip Flexion/Marching: AROM;Seated;Both;15 reps      Assessment/Plan    PT Assessment Patient needs continued PT services  PT Diagnosis Difficulty walking;Generalized weakness   PT Problem List Decreased strength;Decreased activity tolerance;Decreased balance;Decreased knowledge of use of DME;Pain;Decreased mobility  PT Treatment Interventions Gait training;DME instruction;Stair training;Functional mobility training;Therapeutic activities;Therapeutic exercise;Balance training;Patient/family education   PT Goals (Current goals can be found in the Care Plan section) Acute Rehab PT Goals Patient Stated Goal: make it to my granddgtr's high school graduation PT Goal Formulation: With patient/family Time For Goal Achievement: 12/20/14 Potential to Achieve Goals: Fair    Frequency Min 3X/week   Barriers to discharge        Co-evaluation               End of Session   Activity Tolerance: Patient tolerated treatment well Patient left: in chair;with call bell/phone within reach;with family/visitor present Nurse Communication: Mobility status;Precautions         Time: 9450-3888 PT Time Calculation (min) (ACUTE ONLY): 29 min   Charges:   PT Evaluation $Initial PT Evaluation Tier I: 1 Procedure PT Treatments $Gait Training: 8-22 mins $Therapeutic Exercise: 8-22 mins   PT G Codes:        Melford Aase 12/06/2014, 2:37 PM Elwyn Reach, [place]

## 2014-12-07 ENCOUNTER — Other Ambulatory Visit: Payer: Self-pay

## 2014-12-07 DIAGNOSIS — E441 Mild protein-calorie malnutrition: Secondary | ICD-10-CM | POA: Insufficient documentation

## 2014-12-07 DIAGNOSIS — G9340 Encephalopathy, unspecified: Secondary | ICD-10-CM

## 2014-12-07 DIAGNOSIS — R778 Other specified abnormalities of plasma proteins: Secondary | ICD-10-CM

## 2014-12-07 DIAGNOSIS — I059 Rheumatic mitral valve disease, unspecified: Secondary | ICD-10-CM

## 2014-12-07 DIAGNOSIS — R7989 Other specified abnormal findings of blood chemistry: Secondary | ICD-10-CM

## 2014-12-07 LAB — CBC
HCT: 39.3 % (ref 36.0–46.0)
HEMOGLOBIN: 13.1 g/dL (ref 12.0–15.0)
MCH: 29.6 pg (ref 26.0–34.0)
MCHC: 33.3 g/dL (ref 30.0–36.0)
MCV: 88.7 fL (ref 78.0–100.0)
Platelets: 192 10*3/uL (ref 150–400)
RBC: 4.43 MIL/uL (ref 3.87–5.11)
RDW: 13.7 % (ref 11.5–15.5)
WBC: 9.7 10*3/uL (ref 4.0–10.5)

## 2014-12-07 LAB — COMPREHENSIVE METABOLIC PANEL
ALBUMIN: 3.3 g/dL — AB (ref 3.5–5.2)
ALK PHOS: 76 U/L (ref 39–117)
ALT: 25 U/L (ref 0–35)
AST: 50 U/L — AB (ref 0–37)
Anion gap: 4 — ABNORMAL LOW (ref 5–15)
BILIRUBIN TOTAL: 0.4 mg/dL (ref 0.3–1.2)
CHLORIDE: 100 meq/L (ref 96–112)
CO2: 28 mmol/L (ref 19–32)
Calcium: 8.3 mg/dL — ABNORMAL LOW (ref 8.4–10.5)
Creatinine, Ser: 0.4 mg/dL — ABNORMAL LOW (ref 0.50–1.10)
GFR calc Af Amer: >90 mL/min (ref >90)
Glucose, Bld: 107 mg/dL — ABNORMAL HIGH (ref 70–99)
POTASSIUM: 3.2 mmol/L — AB (ref 3.5–5.1)
Sodium: 132 mmol/L — ABNORMAL LOW (ref 135–145)
Total Protein: 6.1 g/dL (ref 6.0–8.3)

## 2014-12-07 LAB — CK TOTAL AND CKMB (NOT AT ARMC)
CK TOTAL: 384 U/L — AB (ref 7–177)
CK, MB: 4 ng/mL (ref 0.3–4.0)
Relative Index: 1 (ref 0.0–2.5)

## 2014-12-07 LAB — URINE CULTURE
CULTURE: NO GROWTH
Colony Count: NO GROWTH

## 2014-12-07 LAB — HIV ANTIBODY (ROUTINE TESTING W REFLEX)
HIV 1/O/2 Abs-Index Value: <1 (ref <1.00)
HIV-1/HIV-2 Ab: NONREACTIVE

## 2014-12-07 LAB — VITAMIN D 25 HYDROXY (VIT D DEFICIENCY, FRACTURES): Vit D, 25-Hydroxy: 40.5 ng/mL (ref 30.0–100.0)

## 2014-12-07 LAB — CORTISOL: CORTISOL PLASMA: 23.4 ug/dL

## 2014-12-07 MED ORDER — POTASSIUM CHLORIDE CRYS ER 20 MEQ PO TBCR
40.0000 meq | EXTENDED_RELEASE_TABLET | Freq: Once | ORAL | Status: AC
  Administered 2014-12-07: 40 meq via ORAL
  Filled 2014-12-07: qty 2

## 2014-12-07 NOTE — Evaluation (Signed)
Occupational Therapy Evaluation Patient Details Name: Rachel Butler MRN: [id number] DOB: 05-30-1956 Today's Date: 12/07/2014    History of Present Illness 59 y.o. female  Whose husband was unable to wake her up this morning. Per husband patient was at baseline last night. She is fairly chronically ill with a 50 pound weight loss in the last year. Pt has chronic pain and was found to be septic with UTI   Clinical Impression   Pt presents w/ dx as above. She demonstrates generalized weakness, decreased functional mobility as well as h/o falls. She should benefit from acute OT to address deficits as outlined below, rec HHOT and supervision at d/c.     Follow Up Recommendations  Home health OT;Supervision/Assistance - 24 hour    Equipment Recommendations  Other (comment) (cont to assess)    Recommendations for Other Services       Precautions / Restrictions Precautions Precautions: Fall Precaution Comments: 2 falls in last year Restrictions Weight Bearing Restrictions: No      Mobility Bed Mobility Overal bed mobility:  (Pt up in chair )                Transfers Overall transfer level: Needs assistance Equipment used: Rolling walker (2 wheeled) Transfers: Sit to/from [facility] Sit to Stand: Min guard Stand pivot transfers: Min guard       General transfer comment: for safety secondary to frequent falls    Balance Overall balance assessment: Needs assistance Sitting-balance support: Feet supported;No upper extremity supported Sitting balance-Leahy Scale: Good     Standing balance support: Bilateral upper extremity supported Standing balance-Leahy Scale: Fair Standing balance comment: Pt performed static standing then functional mobility in room w/ RW use for increased stability. Pt required consistent vc's for safety and sequencing w/ RW as well as hand placement.                            ADL Overall ADL's : Needs  assistance/impaired Eating/Feeding: Sitting;Modified independent;Set up   Grooming: Wash/dry hands;Wash/dry face;Sitting;Modified independent   Upper Body Bathing: Set up;Sitting   Lower Body Bathing: Supervison/ safety;Sit to/from stand   Upper Body Dressing : Sitting;Set up   Lower Body Dressing: Supervision/safety;Sit to/from stand   Toilet Transfer: Minimal assistance;RW;Comfort height toilet;Ambulation   Toileting- Clothing Manipulation and Hygiene: Sit to/from stand;Min guard       Functional mobility during ADLs: Min guard;Rolling walker General ADL Comments: Pt and her husband were educated in role of OT. She was agreeable to ADL retraining session after assessment today and performed functional mobility w/ RW in room and bathroom today. Followed by gorrming while seated. Discussed HHOT at d/c and pt/husband are agreeable to this.      Vision   Wears glasses, no changes from baseline.                  Perception     Praxis      Pertinent Vitals/Pain Pain Assessment: 0-10 Pain Score: 7  Pain Location: Entire back Pain Descriptors / Indicators: Constant Pain Intervention(s): Limited activity within patient's tolerance;Monitored during session;Repositioned;Patient requesting pain meds-RN notified     Hand Dominance Right   Extremity/Trunk Assessment Upper Extremity Assessment Upper Extremity Assessment: Generalized weakness   Lower Extremity Assessment Lower Extremity Assessment: Defer to PT evaluation   Cervical / Trunk Assessment Cervical / Trunk Assessment: Normal   Communication Communication Communication: No difficulties   Cognition Arousal/Alertness: Awake/alert Behavior During  Therapy: Flat affect Overall Cognitive Status: Within Functional Limits for tasks assessed                     General Comments       Exercises       Shoulder Instructions      Home Living Family/patient expects to be discharged to:: Private  residence Living Arrangements: Spouse/significant other Available Help at Discharge: Family;Available 24 hours/day Type of Home: House Home Access: Stairs to enter [employer] of Steps: 3   Home Layout: Two level Alternate Level Stairs-Number of Steps: stair lift   Bathroom Shower/Tub: Walk-in shower;Door   [employer] Toilet: Standard     Home Equipment: Kasandra Knudsen - single point;Shower seat          Prior Functioning/Environment Level of Independence: Needs assistance  Gait / Transfers Assistance Needed: supervision for mobility with cane ADL's / Homemaking Assistance Needed: supervision for bathing standing in shower, mod I with dressing sitting. Spouse and housekeeper due all homemaking        OT Diagnosis: Generalized weakness;Other (comment) (Back pain)   OT Problem List: Decreased strength;Decreased activity tolerance;Impaired balance (sitting and/or standing);Decreased knowledge of use of DME or AE;Pain;Other (comment) (h/o falls)   OT Treatment/Interventions: Self-care/ADL training;DME and/or AE instruction;Patient/family education;Therapeutic activities;Therapeutic exercise    OT Goals(Current goals can be found in the care plan section) Acute Rehab OT Goals Patient Stated Goal: "I wanted to go home today" Time For Goal Achievement: 12/21/14 Potential to Achieve Goals: Good  OT Frequency: Min 2X/week   Barriers to D/C:            Co-evaluation              End of Session Equipment Utilized During Treatment: Gait belt;Rolling walker Nurse Communication: Mobility status  Activity Tolerance: Patient tolerated treatment well Patient left: in chair;with call bell/phone within reach;with family/visitor present   Time: 1217-1236 OT Time Calculation (min): 19 min Charges:  OT General Charges $OT Visit: 1 Procedure OT Evaluation $Initial OT Evaluation Tier I: 1 Procedure OT Treatments $Self Care/Home Management : 8-22 mins G-Codes:    Josephine Igo Dixon, OTR/L 12/07/2014, 1:15 PM

## 2014-12-07 NOTE — Consult Note (Addendum)
CONSULT NOTE  Date: 12/07/2014               Patient Name:  Rachel Butler MRN: [id number]  DOB: [birth year] Age / Sex: 59 y.o., female        PCP: Viviana Simpler Primary Cardiologist: Reola Calkins Rashaan Wyles            Referring Physician: Tat              Reason for Consult: Elevated Troponin           History of Present Illness: Patient is a 59 y.o. female with a PMHx of chonic pain , , who was admitted to [facility] on 12/05/2014 for evaluation of mental status changes.    huysband found her in the Am 2 days ago in bed.  Was not able to wake her up    Does not work, no regular exercise  Has not had any urinary issues recently   Never have any chest  Pain + 50 lb weight loss over the past year Eats ok accordig to her   She has chronic pain syndrome. She wears been no patch. Her gait is very unsteady if she walks with the assistance of a walker.  Smokes 1-2 ppd.  .    Medications: Outpatient medications: Prescriptions prior to admission  Medication Sig Dispense Refill Last Dose  . amitriptyline (ELAVIL) 50 MG tablet Take 1 tablet (50 mg total) by mouth at bedtime. 30 tablet 11 12/04/2014 at Unknown time  . Aspirin-Salicylamide-Caffeine (BC HEADACHE POWDER PO) Take 1 Package by mouth 2 (two) times daily. Pain   12/04/2014 at Unknown time  . fentaNYL (DURAGESIC - DOSED MCG/HR) 75 MCG/HR Place 1 patch onto the skin every 3 (three) days.   12/03/2014  . gabapentin (NEURONTIN) 800 MG tablet Take 800 mg by mouth 4 (four) times daily as needed.   12/04/2014 at Unknown time  . HYDROcodone-acetaminophen (NORCO/VICODIN) 5-325 MG per tablet Take 1 tablet by mouth 3 (three) times daily as needed for pain.   Past Week at Unknown time  . Multiple Vitamins-Minerals (MULTIVITAMIN WITH MINERALS) tablet Take 1 tablet by mouth daily.   12/04/2014 at Unknown time  . promethazine (PHENERGAN) 25 MG tablet TAKE 0.5-1 TABLETS (12.5-25 MG TOTAL) BY MOUTH 3 (THREE) TIMES DAILY AS NEEDED FOR NAUSEA. 30 tablet 1  12/04/2014 at Unknown time  . tiZANidine (ZANAFLEX) 4 MG tablet Take 4 mg by mouth 3 (three) times daily. & 3 tablets at bedtime   12/04/2014 at Unknown time  . amitriptyline (ELAVIL) 25 MG tablet TAKE 1 TO 2 TABLETS BY MOUTH AT BEDTIME (Patient not taking: Reported on 12/05/2014) 60 tablet 3 Not Taking at Unknown time  . amitriptyline (ELAVIL) 25 MG tablet TAKE 1 TO 2 TABLETS BY MOUTH AT BEDTIME (Patient not taking: Reported on 12/05/2014) 60 tablet 3 Not Taking at Unknown time    Current medications: Current Facility-Administered Medications  Medication Dose Route Frequency Provider Last Rate Last Dose  . acetaminophen (TYLENOL) tablet 650 mg  650 mg Oral Q6H PRN Geradine Girt, DO       Or  . acetaminophen (TYLENOL) suppository 650 mg  650 mg Rectal Q6H PRN Geradine Girt, DO      . cefTRIAXone (ROCEPHIN) 1 g in dextrose 5 % 50 mL IVPB - Premix  1 g Intravenous Q24H Geradine Girt, DO   1 g at 12/06/14 1404  . dextrose 5 % and 0.9 % NaCl with  KCl 20 mEq/L infusion   Intravenous Continuous Cherene Altes, MD 60 mL/hr at 12/07/14 0308    . enoxaparin (LOVENOX) injection 40 mg  40 mg Subcutaneous Q24H Geradine Girt, DO   40 mg at 12/06/14 2151  . feeding supplement (RESOURCE BREEZE) (RESOURCE BREEZE) liquid 1 Container  1 Container Oral TID BM Dalene Carrow, RD   1 Container at 12/07/14 1042  . HYDROcodone-acetaminophen (NORCO/VICODIN) 5-325 MG per tablet 1 tablet  1 tablet Oral TID PRN Geradine Girt, DO   1 tablet at 12/07/14 0508  . multivitamin with minerals tablet 1 tablet  1 tablet Oral Daily Geradine Girt, DO   1 tablet at 12/07/14 1041  . ondansetron (ZOFRAN) tablet 4 mg  4 mg Oral Q6H PRN Geradine Girt, DO       Or  . ondansetron (ZOFRAN) injection 4 mg  4 mg Intravenous Q6H PRN Geradine Girt, DO      . tiZANidine (ZANAFLEX) tablet 4 mg  4 mg Oral TID Geradine Girt, DO   4 mg at 12/07/14 1041     Allergies  Allergen Reactions  . Endoxcin [Lidocaine-Menthol]   . Nubain  [Nalbuphine Hcl]   . Penicillins   . Robaxin [Methocarbamol]      Past Medical History  Diagnosis Date  . Nicotine dependence   . Cervical disc disease   . Lumbar disc disease   . Migraines     Past Surgical History  Procedure Laterality Date  . Abdominal hysterectomy  1986    right tube and ovary removed--then laparoscopy after for ?retained cyst?  . Tonsillectomy and adenoidectomy  1970  . Cesarean section  [id number]  . Cervical discectomy  1993    C5-6    Family History  Problem Relation Age of Onset  . Cancer Mother   . COPD Father   . Heart disease Father   . Hypertension Father   . Thyroid disease Other   . Cancer Other     mother's side  . Heart disease Other     Dad's side    Social History:  reports that she has been smoking.  She has never used smokeless tobacco. She reports that she does not drink alcohol or use illicit drugs.   Review of Systems: Constitutional:  denies fever, chills, diaphoresis, appetite change and fatigue.  HEENT: denies photophobia, eye pain, redness, hearing loss, ear pain, congestion, sore throat, rhinorrhea, sneezing, neck pain, neck stiffness and tinnitus.  Respiratory: denies SOB, DOE, cough, chest tightness, and wheezing.  Does have dyspnea at night when she lies down   Cardiovascular: denies chest pain, palpitations and leg swelling.  Gastrointestinal: admits to nausea, vomiting, abdominal pain,   constipation, blood in stool.  Genitourinary: denies dysuria, urgency, frequency, hematuria, flank pain and difficulty urinating.  Musculoskeletal: admits to  myalgias, back pain, joint swelling, arthralgias and gait problem.   Skin: denies pallor, rash and wound.  Neurological: denies dizziness, seizures, syncope, weakness, light-headedness, numbness and headaches.   Hematological: denies adenopathy, easy bruising, personal or family bleeding history.  Psychiatric/ Behavioral: denies suicidal ideation, mood changes, confusion,  nervousness, sleep disturbance and agitation.    Physical Exam: BP 159/88 mmHg  Pulse 94  Temp(Src) 99 F (37.2 C) (Oral)  Resp 15  Ht 5\' 3"  (1.6 m)  Wt 97 lb 10.6 oz (44.3 kg)  BMI 17.30 kg/m2  SpO2 96%  Wt Readings from Last 3 Encounters:  12/06/14 97 lb 10.6 oz (44.3  kg)  06/08/14 90 lb (40.824 kg)  03/09/14 90 lb (40.824 kg)    General: Vital signs reviewed and noted. She is very thin. She appears to be chronically ill.   Head: Normocephalic, atraumatic, sclera anicteric,   Neck: Supple. Negative for carotid bruits. No JVD   Lungs:  Clear bilaterally, no  wheezes, rales, or rhonchi. Breathing is normal   Heart: RRR with S1 S2. No murmurs, rubs, or gallops   Abdomen:  Soft, non-tender, non-distended with normoactive bowel sounds. No hepatomegaly. No rebound/guarding. No obvious abdominal masses   MSK: Strength and the appear normal for age.   Extremities: No clubbing or cyanosis. No edema.  Distal pedal pulses are 2+ and equal .  Legs are quite thin.   Neurologic: Alert and oriented X 3. Moves all extremities spontaneously.  Psych: Responds to questions appropriately with a normal affect.     Lab results: Basic Metabolic Panel:  Recent Labs Lab 12/05/14 1100 12/06/14 0234 12/07/14 0647  NA 140 134* 132*  K 3.5 3.4* 3.2*  CL 97 101 100  CO2 29 26 28   GLUCOSE 51* 93 107*  BUN 7 <5* <5*  CREATININE 0.93 0.40* 0.40*  CALCIUM 9.3 8.1* 8.3*    Liver Function Tests:  Recent Labs Lab 12/05/14 1100 12/07/14 0647  AST 54* 50*  ALT 21 25  ALKPHOS 99 76  BILITOT 0.6 0.4  PROT 6.6 6.1  ALBUMIN 3.7 3.3*   No results for input(s): LIPASE, AMYLASE in the last 168 hours. No results for input(s): AMMONIA in the last 168 hours.  CBC:  Recent Labs Lab 12/05/14 1100 12/06/14 0234 12/07/14 0647  WBC 18.5* 12.9* 9.7  NEUTROABS 15.4*  --   --   HGB 14.7 11.9* 13.1  HCT 45.0 35.8* 39.3  MCV 89.8 90.2 88.7  PLT 260 178 192    Cardiac Enzymes:  Recent  Labs Lab 12/05/14 1100 12/05/14 2137 12/06/14 0234 12/06/14 0939  TROPONINI 0.06* 0.29* 0.19* 0.15*    BNP: Invalid input(s): POCBNP  CBG:  Recent Labs Lab 12/05/14 2045 12/06/14 12/06/14 0325 12/06/14 0732 12/06/14 1216  GLUCAP 213* 154* 147* 112* 102*    Coagulation Studies: No results for input(s): LABPROT, INR in the last 72 hours.   Other results:  EKG:     Imaging: Dg Chest 2 View  12/05/2014   CLINICAL DATA:  Unresponsive, found at home.  EXAM: CHEST  2 VIEW  COMPARISON:  None.  FINDINGS: Normal cardiac and mediastinal contours. The lungs are hyperinflated. No large consolidative pulmonary opacity. No pleural effusion or pneumothorax. Regional skeleton is unremarkable.  IMPRESSION: No acute cardiopulmonary process.   Electronically Signed   By: Lovey Newcomer M.D.   On: 12/05/2014 14:23        Assessment & Plan:  1. Elevated troponin level: The patient presented with mental status changes and was found to have a urosepsis. She denies ever having any episodes of chest pain although she does have risk factors for coronary artery disease. She's  smokes rather heavily.  An echocardiogram  has been ordered and will be done today.  If her echocardiogram is unremarkable with no segmental wall motion abnormalities then I think that the likelihood of her having coronary artery disease is low .  We frequently find troponin elevations in the setting of sepsis and I suspect that this troponin elevation is due to her urosepsis.   She was admitted with profound sepsis with mental status changes, elevated white count, and an  abnormal urinalysis.  I've ordered a total CPK and CPK-MB to see if there is any evidence of rhabdomyolysis.   She's never had any episodes of chest discomfort.  I'll be happy to see her in the office if needed. She is to follow-up with her general medical doctor.   If the echo is normal , we will sign off tomorrow.  2. Weight loss: This is  probably   a more critical issue in her. She's lost about 50 pounds of the past year to year and a half. So far we do not have an explanation.    Further eval per primary care.    Thayer Headings, Brooke Bonito., MD, [facility] 12/07/2014, 1:08 PM Office - [phone] Pager [phone]

## 2014-12-07 NOTE — Progress Notes (Signed)
PROGRESS NOTE  Rachel Butler BLT:[id number] DOB: 01-09-1956 DOA: 12/05/2014 PCP: Viviana Simpler, MD  Brief history 59 y.o. female whose husband was unable to wake her up the morning of her presenation. Per husband patient was at baseline the preceding night. She is chronically ill with a 50 pound weight loss in the last year. She walks with a cane. She has had multiple falls. Patient follows with a pain doctor and wears a fentanyl patch as well as takes Norco when necessary for her chronic pain. Upon arrival to the emergency department, the patient was felt to be in sepsis due to UTI with elevated lactic acid, tachycardia, and leukocytosis. The patient was started on intravenous ceftriaxone. VBG suggested elevated PCO2. The patient was started on intravenous fluids and IV antibiotics. Since admission, the patient has returned to her baseline mental status.   Assessment/Plan: Acute encephalopathy -Multifactorial including hypercarbia, UTI, and possible cardiac etiology -Back to baseline Bacteriuria--?UTI/Sepsis -present at time of admission -The patient did not have significant pyuria, but in the setting of her encephalopathy, leukocytosis, and lactic acidosis, with continued clinical improvement continue intravenous ceftriaxone Elevated troponin with abnormal EKG -Certainly, patient may have had demand ischemia -Repeat EKG today -However in the setting of abnormal EKG with ST depression and CV risk factors, consult cardiology -Echocardiogram--suspicious the patient may have underlying cardiomyopathy Severe protein calorie malnutrition -Continue nutritional supplementation Euthyroid sick syndrome -Other TSH was mildly suppressed, free T4 was WNL -recheck thyroid functions in 4-6 weeks Tobacco abuse -CXR w/o findings - with constitutional sx so concerning for CA -Tobacco cessation discussed FTT - 50 pound weight loss - underweight Body mass index is 17.3 kg/(m^2). -not  up-to-date on colonoscopy nor mammogram - check B12/folic acid - check adrenal axis - check TSH - PT/OT consults - nutrition consult  Chronic pain on chronic narcotics - cervical disk disease  -As pain is diffuse, likely little utility to MRI imaging -Pain is actually relatively controlled with only norco at this time    Family Communication:   Husband updated at beside Disposition Plan:   Home when medically stable       Procedures/Studies: Dg Chest 2 View  12/05/2014   CLINICAL DATA:  Unresponsive, found at home.  EXAM: CHEST  2 VIEW  COMPARISON:  None.  FINDINGS: Normal cardiac and mediastinal contours. The lungs are hyperinflated. No large consolidative pulmonary opacity. No pleural effusion or pneumothorax. Regional skeleton is unremarkable.  IMPRESSION: No acute cardiopulmonary process.   Electronically Signed   By: Lovey Newcomer M.D.   On: 12/05/2014 14:23   Ct Head Wo Contrast  12/05/2014   CLINICAL DATA:  Unresponsive, found by husband around 10 a.m., left-sided weakness  EXAM: CT HEAD WITHOUT CONTRAST  TECHNIQUE: Contiguous axial images were obtained from the base of the skull through the vertex without intravenous contrast.  COMPARISON:  None.  FINDINGS: There is no evidence of mass effect, midline shift, or extra-axial fluid collections. There is no evidence of a space-occupying lesion or intracranial hemorrhage. There is no evidence of a cortical-based area of acute infarction.  The ventricles and sulci are appropriate for the patient's age. The basal cisterns are patent.  Visualized portions of the orbits are unremarkable. The visualized portions of the paranasal sinuses and mastoid air cells are unremarkable.  The osseous structures are unremarkable.  IMPRESSION: No acute intracranial pathology.   Electronically Signed   By: Kathreen Devoid   On: 12/05/2014 13:39  Subjective: Patient continues to complain of some back pain. She denies any fevers, chills, chest pain,  nausea, vomiting, diarrhea, abdominal pain, dysuria patient has some shortness of breath with exertion.  Objective: Filed Vitals:   12/06/14 2250 12/06/14 2251 12/06/14 2252 12/07/14 0615  BP: 161/84 157/78 152/72 159/88  Pulse: 106 98  94  Temp: 98.5 F (36.9 C)   99 F (37.2 C)  TempSrc: Oral   Oral  Resp: 16   15  Height:      Weight:      SpO2: 97% 97%  96%    Intake/Output Summary (Last 24 hours) at 12/07/14 1202 Last data filed at 12/07/14 1133  Gross per 24 hour  Intake 1889.25 ml  Output   3250 ml  Net -1360.75 ml   Weight change:  Exam:   General:  Pt is alert, follows commands appropriately, not in acute distress  HEENT: No icterus, No thrush,  [place]/AT  Cardiovascular: RRR, S1/S2, no rubs, no gallops  Respiratory: Diminished breath sounds without any wheezing.  Abdomen: Soft/+BS, non tender, non distended, no guarding  Extremities: No edema, No lymphangitis, No petechiae, No rashes, no synovitis  Data Reviewed: Basic Metabolic Panel:  Recent Labs Lab 12/05/14 1100 12/06/14 0234 12/07/14 0647  NA 140 134* 132*  K 3.5 3.4* 3.2*  CL 97 101 100  CO2 29 26 28   GLUCOSE 51* 93 107*  BUN 7 <5* <5*  CREATININE 0.93 0.40* 0.40*  CALCIUM 9.3 8.1* 8.3*   Liver Function Tests:  Recent Labs Lab 12/05/14 1100 12/07/14 0647  AST 54* 50*  ALT 21 25  ALKPHOS 99 76  BILITOT 0.6 0.4  PROT 6.6 6.1  ALBUMIN 3.7 3.3*   No results for input(s): LIPASE, AMYLASE in the last 168 hours. No results for input(s): AMMONIA in the last 168 hours. CBC:  Recent Labs Lab 12/05/14 1100 12/06/14 0234 12/07/14 0647  WBC 18.5* 12.9* 9.7  NEUTROABS 15.4*  --   --   HGB 14.7 11.9* 13.1  HCT 45.0 35.8* 39.3  MCV 89.8 90.2 88.7  PLT 260 178 192   Cardiac Enzymes:  Recent Labs Lab 12/05/14 1100 12/05/14 2137 12/06/14 0234 12/06/14 0939  TROPONINI 0.06* 0.29* 0.19* 0.15*   BNP: Invalid input(s): POCBNP CBG:  Recent Labs Lab 12/05/14 2045 12/06/14  12/06/14 0325 12/06/14 0732 12/06/14 1216  GLUCAP 213* 154* 147* 112* 102*    Recent Results (from the past 240 hour(s))  Culture, blood (routine x 2)     Status: None (Preliminary result)   Collection Time: 12/05/14  3:16 PM  Result Value Ref Range Status   Specimen Description BLOOD LEFT FOREARM  Final   Special Requests BOTTLES DRAWN AEROBIC ONLY 4CC  Final   Culture   Final           BLOOD CULTURE RECEIVED NO GROWTH TO DATE CULTURE WILL BE HELD FOR 5 DAYS BEFORE ISSUING A FINAL NEGATIVE REPORT Performed at [facility]    Report Status PENDING  Incomplete  Culture, blood (routine x 2)     Status: None (Preliminary result)   Collection Time: 12/05/14  3:22 PM  Result Value Ref Range Status   Specimen Description BLOOD LEFT HAND  Final   Special Requests BOTTLES DRAWN AEROBIC ONLY 4 CC  Final   Culture   Final           BLOOD CULTURE RECEIVED NO GROWTH TO DATE CULTURE WILL BE HELD FOR 5 DAYS BEFORE ISSUING A  FINAL NEGATIVE REPORT Performed at [facility]    Report Status PENDING  Incomplete  MRSA PCR Screening     Status: None   Collection Time: 12/06/14  5:58 AM  Result Value Ref Range Status   MRSA by PCR NEGATIVE NEGATIVE Final    Comment:        The GeneXpert MRSA Assay (FDA approved for NASAL specimens only), is one component of a comprehensive MRSA colonization surveillance program. It is not intended to diagnose MRSA infection nor to guide or monitor treatment for MRSA infections.   Clostridium Difficile by PCR     Status: None   Collection Time: 12/06/14  3:40 PM  Result Value Ref Range Status   C difficile by pcr NEGATIVE NEGATIVE Final     Scheduled Meds: . cefTRIAXone (ROCEPHIN)  IV  1 g Intravenous Q24H  . enoxaparin (LOVENOX) injection  40 mg Subcutaneous Q24H  . feeding supplement (RESOURCE BREEZE)  1 Container Oral TID BM  . multivitamin with minerals  1 tablet Oral Daily  . tiZANidine  4 mg Oral TID   Continuous Infusions: .  dextrose 5 % and 0.9 % NaCl with KCl 20 mEq/L 60 mL/hr at 12/07/14 0308     Alayja Armas, DO  [facility] Pager [phone]  If 7PM-7AM, please contact night-coverage [email or web address] Password [email or web address] 12/07/2014, 12:02 PM   LOS: 2 days

## 2014-12-07 NOTE — Progress Notes (Signed)
  Echocardiogram 2D Echocardiogram has been performed.  Rachel Butler 12/07/2014, 4:30 PM

## 2014-12-08 MED ORDER — BOOST / RESOURCE BREEZE PO LIQD: 1.0000 | Freq: Three times a day (TID) | ORAL | Status: DC

## 2014-12-08 MED ORDER — CIPROFLOXACIN HCL 500 MG PO TABS: 500.0000 mg | ORAL_TABLET | Freq: Two times a day (BID) | ORAL | Status: DC

## 2014-12-08 NOTE — Discharge Summary (Signed)
PATIENT DETAILS Name: Rachel Butler Age: 59 y.o. Sex: female Date of Birth: 1956-09-28 MRN: [id number]. Admitting Physician: Geradine Girt, DO SJG:[id number] Silvio Pate, MD  Admit Date: 12/05/2014 Discharge date: 12/08/2014  Recommendations for Outpatient Follow-up:  1. Has significant weight loss, please refer to gastroenterology for colonoscopy, will need mammography and other age-appropriate cancer screening. This is being deferred to the outpatient setting as per patient's request 2. Admitted with encephalopathy-suspected secondary to narcotics. Fentanyl patch discontinued for now. Pain seems to be well controlled with as needed narco. Please reevaluate at next visit  PRIMARY DISCHARGE DIAGNOSIS:  Active Problems:   Nicotine dependence   Abnormal weight loss   Altered mental status   UTI (lower urinary tract infection)   Severe sepsis without septic shock   Chronic pain   Acute encephalopathy   Elevated troponin level   Protein-calorie malnutrition, severe      PAST MEDICAL HISTORY: Past Medical History  Diagnosis Date  . Nicotine dependence   . Cervical disc disease   . Lumbar disc disease   . Migraines     DISCHARGE MEDICATIONS: Current Discharge Medication List    START taking these medications   Details  ciprofloxacin (CIPRO) 500 MG tablet Take 1 tablet (500 mg total) by mouth 2 (two) times daily. Qty: 6 tablet, Refills: 0    feeding supplement, RESOURCE BREEZE, (RESOURCE BREEZE) LIQD Take 1 Container by mouth 3 (three) times daily between meals. Qty: 90 Container, Refills: 0      CONTINUE these medications which have NOT CHANGED   Details  amitriptyline (ELAVIL) 50 MG tablet Take 1 tablet (50 mg total) by mouth at bedtime. Qty: 30 tablet, Refills: 11    Aspirin-Salicylamide-Caffeine (BC HEADACHE POWDER PO) Take 1 Package by mouth 2 (two) times daily. Pain    gabapentin (NEURONTIN) 800 MG tablet Take 800 mg by mouth 4 (four) times daily as needed.      HYDROcodone-acetaminophen (NORCO/VICODIN) 5-325 MG per tablet Take 1 tablet by mouth 3 (three) times daily as needed for pain.    Multiple Vitamins-Minerals (MULTIVITAMIN WITH MINERALS) tablet Take 1 tablet by mouth daily.    promethazine (PHENERGAN) 25 MG tablet TAKE 0.5-1 TABLETS (12.5-25 MG TOTAL) BY MOUTH 3 (THREE) TIMES DAILY AS NEEDED FOR NAUSEA. Qty: 30 tablet, Refills: 1    tiZANidine (ZANAFLEX) 4 MG tablet Take 4 mg by mouth 3 (three) times daily. & 3 tablets at bedtime      STOP taking these medications     fentaNYL (DURAGESIC - DOSED MCG/HR) 75 MCG/HR         ALLERGIES:   Allergies  Allergen Reactions  . Endoxcin [Lidocaine-Menthol]   . Nubain [Nalbuphine Hcl]   . Penicillins   . Robaxin [Methocarbamol]     BRIEF HPI:  See H&P, Labs, Consult and Test reports for all details in brief, patient is a 59 year old white female on chronic narcotics who presented with altered mental status. She was then admitted for further evaluation and treatment  CONSULTATIONS:   cardiology  PERTINENT RADIOLOGIC STUDIES: Dg Chest 2 View  12/05/2014   CLINICAL DATA:  Unresponsive, found at home.  EXAM: CHEST  2 VIEW  COMPARISON:  None.  FINDINGS: Normal cardiac and mediastinal contours. The lungs are hyperinflated. No large consolidative pulmonary opacity. No pleural effusion or pneumothorax. Regional skeleton is unremarkable.  IMPRESSION: No acute cardiopulmonary process.   Electronically Signed   By: Lovey Newcomer M.D.   On: 12/05/2014 14:23   Ct Head Wo  Contrast  12/05/2014   CLINICAL DATA:  Unresponsive, found by husband around 10 a.m., left-sided weakness  EXAM: CT HEAD WITHOUT CONTRAST  TECHNIQUE: Contiguous axial images were obtained from the base of the skull through the vertex without intravenous contrast.  COMPARISON:  None.  FINDINGS: There is no evidence of mass effect, midline shift, or extra-axial fluid collections. There is no evidence of a space-occupying lesion or  intracranial hemorrhage. There is no evidence of a cortical-based area of acute infarction.  The ventricles and sulci are appropriate for the patient's age. The basal cisterns are patent.  Visualized portions of the orbits are unremarkable. The visualized portions of the paranasal sinuses and mastoid air cells are unremarkable.  The osseous structures are unremarkable.  IMPRESSION: No acute intracranial pathology.   Electronically Signed   By: Kathreen Devoid   On: 12/05/2014 13:39     PERTINENT LAB RESULTS: CBC:  Recent Labs  12/06/14 0234 12/07/14 0647  WBC 12.9* 9.7  HGB 11.9* 13.1  HCT 35.8* 39.3  PLT 178 192   CMET CMP     Component Value Date/Time   NA 132* 12/07/2014 0647   K 3.2* 12/07/2014 0647   CL 100 12/07/2014 0647   CO2 28 12/07/2014 0647   GLUCOSE 107* 12/07/2014 0647   BUN <5* 12/07/2014 0647   CREATININE 0.40* 12/07/2014 0647   CALCIUM 8.3* 12/07/2014 0647   PROT 6.1 12/07/2014 0647   ALBUMIN 3.3* 12/07/2014 0647   AST 50* 12/07/2014 0647   ALT 25 12/07/2014 0647   ALKPHOS 76 12/07/2014 0647   BILITOT 0.4 12/07/2014 0647   GFRNONAA >90 12/07/2014 0647   GFRAA >90 12/07/2014 0647    GFR Estimated Creatinine Clearance: 54.9 mL/min (by C-G formula based on Cr of 0.4). No results for input(s): LIPASE, AMYLASE in the last 72 hours.  Recent Labs  12/05/14 2137 12/06/14 0234 12/06/14 0939 12/07/14 1554  CKTOTAL  --   --   --  384*  CKMB  --   --   --  4.0  TROPONINI 0.29* 0.19* 0.15*  --    Invalid input(s): POCBNP No results for input(s): DDIMER in the last 72 hours. No results for input(s): HGBA1C in the last 72 hours. No results for input(s): CHOL, HDL, LDLCALC, TRIG, CHOLHDL, LDLDIRECT in the last 72 hours.  Recent Labs  12/05/14 2137  TSH 0.220*    Recent Labs  12/06/14 0939  VITAMINB12 550  FOLATE >20.0  FERRITIN 50  TIBC 226*  IRON 47  RETICCTPCT 1.8   Coags: No results for input(s): INR in the last 72 hours.  Invalid input(s):  PT Microbiology: Recent Results (from the past 240 hour(s))  Culture, blood (routine x 2)     Status: None (Preliminary result)   Collection Time: 12/05/14  3:16 PM  Result Value Ref Range Status   Specimen Description BLOOD LEFT FOREARM  Final   Special Requests BOTTLES DRAWN AEROBIC ONLY 4CC  Final   Culture   Final           BLOOD CULTURE RECEIVED NO GROWTH TO DATE CULTURE WILL BE HELD FOR 5 DAYS BEFORE ISSUING A FINAL NEGATIVE REPORT Performed at [facility]    Report Status PENDING  Incomplete  Culture, blood (routine x 2)     Status: None (Preliminary result)   Collection Time: 12/05/14  3:22 PM  Result Value Ref Range Status   Specimen Description BLOOD LEFT HAND  Final   Special Requests BOTTLES DRAWN  AEROBIC ONLY 4 CC  Final   Culture   Final           BLOOD CULTURE RECEIVED NO GROWTH TO DATE CULTURE WILL BE HELD FOR 5 DAYS BEFORE ISSUING A FINAL NEGATIVE REPORT Performed at [facility]    Report Status PENDING  Incomplete  MRSA PCR Screening     Status: None   Collection Time: 12/06/14  5:58 AM  Result Value Ref Range Status   MRSA by PCR NEGATIVE NEGATIVE Final    Comment:        The GeneXpert MRSA Assay (FDA approved for NASAL specimens only), is one component of a comprehensive MRSA colonization surveillance program. It is not intended to diagnose MRSA infection nor to guide or monitor treatment for MRSA infections.   Clostridium Difficile by PCR     Status: None   Collection Time: 12/06/14  3:40 PM  Result Value Ref Range Status   C difficile by pcr NEGATIVE NEGATIVE Final  Culture, Urine     Status: None   Collection Time: 12/06/14  5:54 PM  Result Value Ref Range Status   Specimen Description URINE, RANDOM  Final   Special Requests NONE  Final   Colony Count NO GROWTH Performed at [facility]   Final   Culture NO GROWTH Performed at [facility]   Final   Report Status 12/07/2014 FINAL  Final     BRIEF  HOSPITAL COURSE:   Active Problems:   Acute encephalopathy: Multifactorial, likely secondary to narcotic use and resultant hypercarbia, with some contribution from UTI. Completely resolved, patient back to baseline at discharge. CT head on admission was negative for acute intracranial pathology. See below for further details.  Nicotine dependence   UTI with sepsis: UA with bacteriuria, did have leukocytosis and low-grade fever on admission. Empirically started on Rocephin. Patient was admitted on 1/10, however urine cultures were obtained on 1/11. Urine cultures negative however not sure if patient already had antibiotics prior to collection of the sample. Because of low-grade fever, leukocytosis, we will continue to treat for 3 more with ciprofloxacin days to complete a 5 day course.    Chronic pain syndrome: Secondary to back pain and degenerative disc disease. Nonfocal exam. Given her presentation with acute encephalopathy, fentanyl transdermally has been discontinued. Pain seems to be reasonably well controlled with as needed Norco. She is aware that she will need to follow-up with her pain management M.D. in the next few weeks for further continued care. She is also aware, that she is not to continue fentanyl transdermally on discharge till seen by her pain management M.D.    Elevated troponin: Likely secondary to Demand ischemia. Seen by cardiology, recommended that patient undergo a transthoracic echocardiogram, if negative for abnormal wall motion and no further inpatient workup recommended. Echocardiogram on 1/12 showed an EF around 45-50%. Deferred further workup to the outpatient setting at this time. Please note, patient was asymptomatic without chest pain or shortness of breath.     Abnormal weight loss: Apparently has had significant weight loss (more than 50 pounds) over the past few years. Patient claimed to this M.D. that her primary care practitioner is in the process of evaluating her  and is well aware of this issue. Patient prefers further workup to be done at the discretion of her primary care practitioner in the outpatient setting. I have recommended that she undergo age-appropriate cancer screening with at least a colonoscopy and a mammography. She prefers this  to be done in the outpatient setting, as a result of this is being deferred to her primary care practitioner. I have also given the patient number for [facility], and have recommended that she call and make an appointment with an M.D. of her choosing. Both patient and husband were, that undiagnosed malignancy can result in weight loss. Please also note, TSH was slightly low, however free T4 was normal, random plasma cortisol was around 23.4.   Severe protein calorie malnutrition: Continue with supplements. Patient needs age-appropriate cancer screening as recommended above. See above.  TODAY-DAY OF DISCHARGE:  Subjective:   Rachel Butler today has no headache,no chest abdominal pain,no new weakness tingling or numbness, feels much better wants to go home today.   Objective:   Blood pressure 125/109, pulse 101, temperature 98.4 F (36.9 C), temperature source Oral, resp. rate 20, height 5\' 3"  (1.6 m), weight 44.3 kg (97 lb 10.6 oz), SpO2 95 %.  Intake/Output Summary (Last 24 hours) at 12/08/14 1116 Last data filed at 12/08/14 0925  Gross per 24 hour  Intake    720 ml  Output   2430 ml  Net  -1710 ml   Filed Weights   12/06/14 0327  Weight: 44.3 kg (97 lb 10.6 oz)    Exam Awake Alert, Oriented *3, No new F.N deficits, Normal affect [place].AT,PERRAL Supple Neck,No JVD, No cervical lymphadenopathy appriciated.  Symmetrical Chest wall movement, Good air movement bilaterally, CTAB RRR,No Gallops,Rubs or new Murmurs, No Parasternal Heave +ve B.Sounds, Abd Soft, Non tender, No organomegaly appriciated, No rebound -guarding or rigidity. No Cyanosis, Clubbing or edema, No new Rash or  bruise  DISCHARGE CONDITION: Stable  DISPOSITION: Home  DISCHARGE INSTRUCTIONS:    Activity:  As tolerated   Diet recommendation: Regular Diet  Discharge Instructions    Call MD for:  persistant nausea and vomiting    Complete by:  As directed      Call MD for:  severe uncontrolled pain    Complete by:  As directed      Call MD for:  temperature >100.4    Complete by:  As directed      Diet general    Complete by:  As directed      Increase activity slowly    Complete by:  As directed            Follow-up Information    Follow up with Viviana Simpler, MD. Schedule an appointment as soon as possible for a visit in 1 week.   Specialties:  Internal Medicine, Pediatrics   Why:  please ask Dr Silvio Pate for GI referral for Colonoscopy and Mammogram   Contact information:   [place] [address] [phone]       Follow up with Nahser, Wonda Cheng, MD. Schedule an appointment as soon as possible for a visit in 2 weeks.   Specialty:  Cardiology   Contact information:   [address] [place] [address] [phone]       Follow up with [facility].   Specialty:  Gastroenterology   Why:  PLEASE MAKE AN APPOINTMENT IN THE NEXT 2-3 WEEKS WITH A MD OF YOUR CHOOSING FOR A COLONOSCOPY   Contact information:   [address]  [place] [address] [phone]      Schedule an appointment as soon as possible for a visit in 2 weeks to follow up.   Contact information:   Pain Management MD that you  regularly see        Total Time spent on discharge equals 45 minutes.  SignedOren Binet 12/08/2014 11:16 AM

## 2014-12-08 NOTE — Progress Notes (Signed)
Physical Therapy Treatment Patient Details Name: Rachel Butler MRN: [id number] DOB: 12-Jun-1956 Today's Date: 12/08/2014    History of Present Illness 59 y.o. female  Whose husband was unable to wake her up this morning. Per husband patient was at baseline last night. She is fairly chronically ill with a 50 pound weight loss in the last year. Pt has chronic pain and was found to be septic with UTI    PT Comments    Pt progressing well with all mobility.  Pt requesting RW for use at home for increased safety.  PT agrees for increased support.  Provided pt with HEP for continued strengthening at home.   Follow Up Recommendations  Home health PT;Supervision for mobility/OOB     Equipment Recommendations  3in1 (PT);Rolling walker with 5" wheels    Recommendations for Other Services       Precautions / Restrictions Precautions Precautions: Fall Precaution Comments: 2 falls in last year Restrictions Weight Bearing Restrictions: No    Mobility  Bed Mobility Overal bed mobility:  (Pt in recliner when PT arrived. )                Transfers Overall transfer level: Needs assistance Equipment used: Rolling walker (2 wheeled) Transfers: Sit to/from Stand Sit to Stand: Supervision Stand pivot transfers: Supervision       General transfer comment: S for safety with min cues for hand placement.   Ambulation/Gait Ambulation/Gait assistance: Supervision Ambulation Distance (Feet): 300 Feet Assistive device: Rolling walker (2 wheeled) Gait Pattern/deviations: Step-through pattern;Decreased stride length     General Gait Details: Min cues for posture throughout.  Feel she could use either RW or cane at home.  Discussed ordering her a RW for increased safety.    Stairs Stairs: Yes Stairs assistance: Min assist Stair Management: One rail Left;Alternating pattern;Forwards Number of Stairs: 5 General stair comments: Husband provided assist up/down stairs to simulate how  they perform at home.  Did so safely with min cuing for stepping sequence.   Wheelchair Mobility    Modified Rankin (Stroke Patients Only)       Balance Overall balance assessment: Needs assistance Sitting-balance support: Feet supported Sitting balance-Leahy Scale: Good     Standing balance support: Bilateral upper extremity supported;During functional activity Standing balance-Leahy Scale: Good                      Cognition Arousal/Alertness: Awake/alert Behavior During Therapy: Flat affect Overall Cognitive Status: Within Functional Limits for tasks assessed                      Exercises Other Exercises Other Exercises: LAQ's x 10 reps BLE, standing hip abd x 10 reps BLE, standing knee flex x 10 reps BLE, mini squats x 10 reps, heel/toe raises x 10 reps    General Comments General comments (skin integrity, edema, etc.): Provided handout for OTAGO HEP, see below.       Pertinent Vitals/Pain Pain Assessment: No/denies pain    Home Living                      Prior Function            PT Goals (current goals can now be found in the care plan section) Acute Rehab PT Goals Patient Stated Goal: "I wanted to go home today" PT Goal Formulation: With patient/family Time For Goal Achievement: 12/20/14 Potential to Achieve Goals: Good Progress towards PT goals:  Progressing toward goals    Frequency  Min 3X/week    PT Plan Current plan remains appropriate    Co-evaluation             End of Session   Activity Tolerance: Patient tolerated treatment well Patient left: in chair;with call bell/phone within reach;with family/visitor present     Time: 5449-2010 PT Time Calculation (min) (ACUTE ONLY): 30 min  Charges:  $Gait Training: 8-22 mins $Therapeutic Exercise: 8-22 mins                    G Codes:      [name] 12/08/2014, 12:16 PM

## 2014-12-09 LAB — VITAMIN D 1,25 DIHYDROXY
VITAMIN D3 1, 25 (OH): 62 pg/mL
Vitamin D 1, 25 (OH)2 Total: 62 pg/mL (ref 18–72)

## 2014-12-11 LAB — CULTURE, BLOOD (ROUTINE X 2)
CULTURE: NO GROWTH
Culture: NO GROWTH

## 2014-12-16 DIAGNOSIS — M5030 Other cervical disc degeneration, unspecified cervical region: Secondary | ICD-10-CM | POA: Diagnosis not present

## 2014-12-16 DIAGNOSIS — E039 Hypothyroidism, unspecified: Secondary | ICD-10-CM | POA: Diagnosis not present

## 2014-12-16 DIAGNOSIS — M961 Postlaminectomy syndrome, not elsewhere classified: Secondary | ICD-10-CM | POA: Diagnosis not present

## 2014-12-16 DIAGNOSIS — M5417 Radiculopathy, lumbosacral region: Secondary | ICD-10-CM | POA: Diagnosis not present

## 2014-12-20 ENCOUNTER — Encounter: Payer: Self-pay | Admitting: Internal Medicine

## 2014-12-20 ENCOUNTER — Ambulatory Visit (INDEPENDENT_AMBULATORY_CARE_PROVIDER_SITE_OTHER): Payer: Medicare Other | Admitting: Internal Medicine

## 2014-12-20 VITALS — BP 140/80 | HR 102 | Temp 97.8°F | Ht 63.0 in | Wt 95.0 lb

## 2014-12-20 DIAGNOSIS — Z7189 Other specified counseling: Secondary | ICD-10-CM

## 2014-12-20 DIAGNOSIS — Z0000 Encounter for general adult medical examination without abnormal findings: Secondary | ICD-10-CM

## 2014-12-20 DIAGNOSIS — G8929 Other chronic pain: Secondary | ICD-10-CM | POA: Diagnosis not present

## 2014-12-20 DIAGNOSIS — Z23 Encounter for immunization: Secondary | ICD-10-CM | POA: Diagnosis not present

## 2014-12-20 DIAGNOSIS — Z1211 Encounter for screening for malignant neoplasm of colon: Secondary | ICD-10-CM

## 2014-12-20 DIAGNOSIS — F17200 Nicotine dependence, unspecified, uncomplicated: Secondary | ICD-10-CM

## 2014-12-20 NOTE — Assessment & Plan Note (Signed)
I have personally reviewed the Medicare Annual Wellness questionnaire and have noted 1. The patient's medical and social history 2. Their use of alcohol, tobacco or illicit drugs 3. Their current medications and supplements 4. The patient's functional ability including ADL's, fall risks, home safety risks and hearing or visual             impairment. 5. Diet and physical activities 6. Evidence for depression or mood disorders  The patients weight, height, BMI and visual acuity have been recorded in the chart I have made referrals, counseling and provided education to the patient based review of the above and I have provided the pt with a written personalized care plan for preventive services.  I have provided you with a copy of your personalized plan for preventive services. Please take the time to review along with your updated medication list.  No pap due to hyster Asked her to schedule mammogram Will set up screening colonoscopy Will take flu shot
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
## 2014-12-20 NOTE — Assessment & Plan Note (Signed)
Counseled  Has been cutting down

## 2014-12-20 NOTE — Patient Instructions (Addendum)
Please set up your screening mammogram. Please call [phone] QUIT NOW for help in stopping smoking.

## 2014-12-20 NOTE — Assessment & Plan Note (Signed)
See social history 

## 2014-12-20 NOTE — Addendum Note (Signed)
Addended by: Despina Hidden on: 12/20/2014 03:55 PM   Modules accepted: Orders

## 2014-12-20 NOTE — Assessment & Plan Note (Signed)
Continues to see the pain specialists Narcotics were decreased Added senna S for constipation

## 2014-12-20 NOTE — Progress Notes (Signed)
Pre visit review using our clinic review tool, if applicable. No additional management support is needed unless otherwise documented below in the visit note. 

## 2015-01-14 DIAGNOSIS — R202 Paresthesia of skin: Secondary | ICD-10-CM | POA: Diagnosis not present

## 2015-01-14 DIAGNOSIS — R251 Tremor, unspecified: Secondary | ICD-10-CM | POA: Diagnosis not present

## 2015-01-14 DIAGNOSIS — G43719 Chronic migraine without aura, intractable, without status migrainosus: Secondary | ICD-10-CM | POA: Diagnosis not present

## 2015-01-14 DIAGNOSIS — G629 Polyneuropathy, unspecified: Secondary | ICD-10-CM | POA: Diagnosis not present

## 2015-01-14 DIAGNOSIS — R51 Headache: Secondary | ICD-10-CM | POA: Diagnosis not present

## 2015-02-02 ENCOUNTER — Ambulatory Visit (AMBULATORY_SURGERY_CENTER): Payer: Self-pay

## 2015-02-02 VITALS — Ht 63.0 in | Wt 95.0 lb

## 2015-02-02 DIAGNOSIS — Z1211 Encounter for screening for malignant neoplasm of colon: Secondary | ICD-10-CM

## 2015-02-02 MED ORDER — MOVIPREP 100 G PO SOLR: 1.0000 | Freq: Once | ORAL | Status: DC

## 2015-02-02 NOTE — Progress Notes (Signed)
No allergies to eggs or soy No home oxygen No diet/weight loss meds No past problems with anesthesia  Has email  Emmi instructions given for colonoscopy 

## 2015-02-04 DIAGNOSIS — M545 Low back pain: Secondary | ICD-10-CM | POA: Diagnosis not present

## 2015-02-04 DIAGNOSIS — M961 Postlaminectomy syndrome, not elsewhere classified: Secondary | ICD-10-CM | POA: Diagnosis not present

## 2015-02-04 DIAGNOSIS — M5030 Other cervical disc degeneration, unspecified cervical region: Secondary | ICD-10-CM | POA: Diagnosis not present

## 2015-02-04 DIAGNOSIS — M5417 Radiculopathy, lumbosacral region: Secondary | ICD-10-CM | POA: Diagnosis not present

## 2015-02-14 ENCOUNTER — Telehealth: Payer: Self-pay | Admitting: Internal Medicine

## 2015-02-14 NOTE — Telephone Encounter (Signed)
Advised pt to put prep in refrigerator in air tight container (which came with prep). Levada Dy PV

## 2015-02-16 ENCOUNTER — Encounter: Payer: Self-pay | Admitting: Internal Medicine

## 2015-02-16 ENCOUNTER — Ambulatory Visit (AMBULATORY_SURGERY_CENTER): Payer: Medicare Other | Admitting: Internal Medicine

## 2015-02-16 VITALS — BP 174/101 | HR 86 | Temp 96.8°F | Resp 24 | Ht 63.0 in | Wt 95.0 lb

## 2015-02-16 DIAGNOSIS — Z1211 Encounter for screening for malignant neoplasm of colon: Secondary | ICD-10-CM

## 2015-02-16 DIAGNOSIS — G8929 Other chronic pain: Secondary | ICD-10-CM | POA: Diagnosis not present

## 2015-02-16 MED ORDER — SODIUM CHLORIDE 0.9 % IV SOLN: 500.0000 mL | INTRAVENOUS | Status: DC

## 2015-02-16 NOTE — Patient Instructions (Signed)
YOU HAD AN ENDOSCOPIC PROCEDURE TODAY AT [facility]:   Refer to the procedure report that was given to you for any specific questions about what was found during the examination.  If the procedure report does not answer your questions, please call your gastroenterologist to clarify.  If you requested that your care partner not be given the details of your procedure findings, then the procedure report has been included in a sealed envelope for you to review at your convenience later.  YOU SHOULD EXPECT: Some feelings of bloating in the abdomen. Passage of more gas than usual.  Walking can help get rid of the air that was put into your GI tract during the procedure and reduce the bloating. If you had a lower endoscopy (such as a colonoscopy or flexible sigmoidoscopy) you may notice spotting of blood in your stool or on the toilet paper. If you underwent a bowel prep for your procedure, you may not have a normal bowel movement for a few days.  Please Note:  You might notice some irritation and congestion in your nose or some drainage.  This is from the oxygen used during your procedure.  There is no need for concern and it should clear up in a day or so.  SYMPTOMS TO REPORT IMMEDIATELY:   Following lower endoscopy (colonoscopy or flexible sigmoidoscopy):  Excessive amounts of blood in the stool  Significant tenderness or worsening of abdominal pains  Swelling of the abdomen that is new, acute  Fever of 100F or higher   For urgent or emergent issues, a gastroenterologist can be reached at any hour by calling [phone].   DIET: Your first meal following the procedure should be a small meal and then it is ok to progress to your normal diet. Heavy or fried foods are harder to digest and may make you feel nauseous or bloated.  Likewise, meals heavy in dairy and vegetables can increase bloating.  Drink plenty of fluids but you should avoid alcoholic beverages for 24  hours.  ACTIVITY:  You should plan to take it easy for the rest of today and you should NOT DRIVE or use heavy machinery until tomorrow (because of the sedation medicines used during the test).    FOLLOW UP: Our staff will call the number listed on your records the next business day following your procedure to check on you and address any questions or concerns that you may have regarding the information given to you following your procedure. If we do not reach you, we will leave a message.  However, if you are feeling well and you are not experiencing any problems, there is no need to return our call.  We will assume that you have returned to your regular daily activities without incident.  If any biopsies were taken you will be contacted by phone or by letter within the next 1-3 weeks.  Please call us at [phone] if you have not heard about the biopsies in 3 weeks.    SIGNATURES/CONFIDENTIALITY: You and/or your care partner have signed paperwork which will be entered into your electronic medical record.  These signatures attest to the fact that that the information above on your After Visit Summary has been reviewed and is understood.  Full responsibility of the confidentiality of this discharge information lies with you and/or your care-partner.  Recommendations Discharge instructions given to patient and/or care partner. Next colonoscopy in 10 years.

## 2015-02-16 NOTE — Progress Notes (Signed)
A/ox3 pleased with MAC, report to Wendy RN 

## 2015-02-16 NOTE — Op Note (Signed)
[place]  [address] [place], [address]   COLONOSCOPY PROCEDURE REPORT  PATIENT: Rachel Butler, Rachel Butler  MR#: [id number] BIRTHDATE: [birth year] , 79  yrs. old GENDER: female ENDOSCOPIST: Jerene Bears, MD REFERRED IH:WTUUEKC Silvio Pate, M.D. PROCEDURE DATE:  02/16/2015 PROCEDURE:   Colonoscopy, screening First Screening Colonoscopy - Avg.  risk and is 50 yrs.  old or older Yes.  Prior Negative Screening - Now for repeat screening. N/A  History of Adenoma - Now for follow-up colonoscopy & has been > or = to 3 yrs.  N/A ASA CLASS:   Class II INDICATIONS:Screening for colonic neoplasia and Colorectal Neoplasm Risk Assessment for this procedure is average risk. MEDICATIONS: Monitored anesthesia care and Propofol 200 mg IV  DESCRIPTION OF PROCEDURE:   After the risks benefits and alternatives of the procedure were thoroughly explained, informed consent was obtained.  The digital rectal exam revealed no abnormalities of the rectum.   The LB [id number]  endoscope was introduced through the anus and advanced to the cecum, which was identified by both the appendix and ileocecal valve. No adverse events experienced.   The quality of the prep was good, using MoviPrep  The instrument was then slowly withdrawn as the colon was fully examined.   COLON FINDINGS: A normal appearing cecum, ileocecal valve, and appendiceal orifice were identified.  the ascending, transverse, descending, sigmoid colon, and rectum appeared unremarkable. Retroflexed views revealed internal hemorrhoids. The time to cecum = 5.7 Withdrawal time = 12.5   The scope was withdrawn and the procedure completed.  COMPLICATIONS: There were no complications.  ENDOSCOPIC IMPRESSION: Normal colonoscopy  RECOMMENDATIONS: You should continue to follow colorectal cancer screening guidelines for "routine risk" patients with a repeat colonoscopy in 10 years. There is no need for FOBT (stool) testing for at least 5  years.  eSigned:  Jerene Bears, MD 02/16/2015 11:38 AM   cc: Venia Carbon, MD and The Patient

## 2015-02-17 ENCOUNTER — Telehealth: Payer: Self-pay | Admitting: *Deleted

## 2015-02-17 NOTE — Telephone Encounter (Signed)
  Follow up Call-  Call back number 02/16/2015  Post procedure Call Back phone  # [phone]  Permission to leave phone message Yes     Patient questions:  Do you have a fever, pain , or abdominal swelling? No. Pain Score  0 *  Have you tolerated food without any problems? Yes.    Have you been able to return to your normal activities? Yes.    Do you have any questions about your discharge instructions: Diet   No. Medications  No. Follow up visit  No.  Do you have questions or concerns about your Care? No.  Actions: * If pain score is 4 or above: No action needed, pain <4.

## 2015-03-31 DIAGNOSIS — M961 Postlaminectomy syndrome, not elsewhere classified: Secondary | ICD-10-CM | POA: Diagnosis not present

## 2015-03-31 DIAGNOSIS — M5417 Radiculopathy, lumbosacral region: Secondary | ICD-10-CM | POA: Diagnosis not present

## 2015-03-31 DIAGNOSIS — Z79899 Other long term (current) drug therapy: Secondary | ICD-10-CM | POA: Diagnosis not present

## 2015-03-31 DIAGNOSIS — Z5181 Encounter for therapeutic drug level monitoring: Secondary | ICD-10-CM | POA: Diagnosis not present

## 2015-03-31 DIAGNOSIS — M5030 Other cervical disc degeneration, unspecified cervical region: Secondary | ICD-10-CM | POA: Diagnosis not present

## 2015-04-20 ENCOUNTER — Other Ambulatory Visit: Payer: Self-pay | Admitting: Internal Medicine

## 2015-04-28 ENCOUNTER — Other Ambulatory Visit: Payer: Self-pay | Admitting: Internal Medicine

## 2015-05-31 DIAGNOSIS — M5440 Lumbago with sciatica, unspecified side: Secondary | ICD-10-CM | POA: Diagnosis not present

## 2015-05-31 DIAGNOSIS — M961 Postlaminectomy syndrome, not elsewhere classified: Secondary | ICD-10-CM | POA: Diagnosis not present

## 2015-05-31 DIAGNOSIS — M5030 Other cervical disc degeneration, unspecified cervical region: Secondary | ICD-10-CM | POA: Diagnosis not present

## 2015-05-31 DIAGNOSIS — M5417 Radiculopathy, lumbosacral region: Secondary | ICD-10-CM | POA: Diagnosis not present

## 2015-06-16 ENCOUNTER — Encounter: Payer: Self-pay | Admitting: Internal Medicine

## 2015-06-16 ENCOUNTER — Telehealth: Payer: Self-pay

## 2015-06-16 NOTE — Telephone Encounter (Signed)
Called patient to notify her of being due for a mammogram. Patient declined any help scheduling one at the moment. 

## 2015-06-21 DIAGNOSIS — Z886 Allergy status to analgesic agent status: Secondary | ICD-10-CM | POA: Diagnosis not present

## 2015-06-21 DIAGNOSIS — Z880 Allergy status to penicillin: Secondary | ICD-10-CM | POA: Diagnosis not present

## 2015-06-21 DIAGNOSIS — Z91048 Other nonmedicinal substance allergy status: Secondary | ICD-10-CM | POA: Diagnosis not present

## 2015-06-21 DIAGNOSIS — M5030 Other cervical disc degeneration, unspecified cervical region: Secondary | ICD-10-CM | POA: Diagnosis not present

## 2015-06-21 DIAGNOSIS — M5417 Radiculopathy, lumbosacral region: Secondary | ICD-10-CM | POA: Diagnosis not present

## 2015-06-21 DIAGNOSIS — Z888 Allergy status to other drugs, medicaments and biological substances status: Secondary | ICD-10-CM | POA: Diagnosis not present

## 2015-06-21 DIAGNOSIS — M961 Postlaminectomy syndrome, not elsewhere classified: Secondary | ICD-10-CM | POA: Diagnosis not present

## 2015-06-21 DIAGNOSIS — F17200 Nicotine dependence, unspecified, uncomplicated: Secondary | ICD-10-CM | POA: Diagnosis not present

## 2015-07-22 DIAGNOSIS — G252 Other specified forms of tremor: Secondary | ICD-10-CM | POA: Diagnosis not present

## 2015-07-22 DIAGNOSIS — G44209 Tension-type headache, unspecified, not intractable: Secondary | ICD-10-CM | POA: Diagnosis not present

## 2015-07-22 DIAGNOSIS — R208 Other disturbances of skin sensation: Secondary | ICD-10-CM | POA: Diagnosis not present

## 2015-07-22 DIAGNOSIS — R269 Unspecified abnormalities of gait and mobility: Secondary | ICD-10-CM | POA: Diagnosis not present

## 2015-07-22 DIAGNOSIS — R200 Anesthesia of skin: Secondary | ICD-10-CM | POA: Diagnosis not present

## 2015-07-22 DIAGNOSIS — R5383 Other fatigue: Secondary | ICD-10-CM | POA: Diagnosis not present

## 2015-07-22 DIAGNOSIS — R251 Tremor, unspecified: Secondary | ICD-10-CM | POA: Diagnosis not present

## 2015-07-26 DIAGNOSIS — M545 Low back pain: Secondary | ICD-10-CM | POA: Diagnosis not present

## 2015-07-26 DIAGNOSIS — M5440 Lumbago with sciatica, unspecified side: Secondary | ICD-10-CM | POA: Diagnosis not present

## 2015-07-26 DIAGNOSIS — M5417 Radiculopathy, lumbosacral region: Secondary | ICD-10-CM | POA: Diagnosis not present

## 2015-07-26 DIAGNOSIS — M5030 Other cervical disc degeneration, unspecified cervical region: Secondary | ICD-10-CM | POA: Diagnosis not present

## 2015-08-17 ENCOUNTER — Ambulatory Visit (INDEPENDENT_AMBULATORY_CARE_PROVIDER_SITE_OTHER): Payer: Medicare Other | Admitting: Internal Medicine

## 2015-08-17 ENCOUNTER — Telehealth: Payer: Self-pay | Admitting: Internal Medicine

## 2015-08-17 ENCOUNTER — Ambulatory Visit (INDEPENDENT_AMBULATORY_CARE_PROVIDER_SITE_OTHER)
Admission: RE | Admit: 2015-08-17 | Discharge: 2015-08-17 | Disposition: A | Discharge status: Home / Self Care | Payer: Medicare Other | Source: Ambulatory Visit | Attending: Internal Medicine | Admitting: Internal Medicine

## 2015-08-17 ENCOUNTER — Encounter: Payer: Self-pay | Admitting: Internal Medicine

## 2015-08-17 VITALS — BP 120/80 | HR 107 | Temp 98.3°F | Wt 128.0 lb

## 2015-08-17 DIAGNOSIS — R0609 Other forms of dyspnea: Secondary | ICD-10-CM

## 2015-08-17 DIAGNOSIS — J90 Pleural effusion, not elsewhere classified: Secondary | ICD-10-CM | POA: Diagnosis not present

## 2015-08-17 DIAGNOSIS — R601 Generalized edema: Secondary | ICD-10-CM | POA: Diagnosis not present

## 2015-08-17 LAB — COMPREHENSIVE METABOLIC PANEL
ALBUMIN: 3.2 g/dL — AB (ref 3.5–5.2)
ALK PHOS: 143 U/L — AB (ref 39–117)
ALT: 11 U/L (ref 0–35)
AST: 31 U/L (ref 0–37)
BUN: 5 mg/dL — AB (ref 6–23)
CALCIUM: 8.9 mg/dL (ref 8.4–10.5)
CO2: 35 mEq/L — ABNORMAL HIGH (ref 19–32)
Chloride: 100 mEq/L (ref 96–112)
Creatinine, Ser: 0.91 mg/dL (ref 0.40–1.20)
GFR: 67.67 mL/min (ref >60.00)
Glucose, Bld: 84 mg/dL (ref 70–99)
POTASSIUM: 4.2 meq/L (ref 3.5–5.1)
SODIUM: 142 meq/L (ref 135–145)
TOTAL PROTEIN: 6.8 g/dL (ref 6.0–8.3)
Total Bilirubin: 0.2 mg/dL (ref 0.2–1.2)

## 2015-08-17 LAB — POCT URINALYSIS DIPSTICK
BILIRUBIN UA: NEGATIVE
GLUCOSE UA: NEGATIVE
KETONES UA: NEGATIVE
Leukocytes, UA: NEGATIVE
Nitrite, UA: NEGATIVE
RBC UA: NEGATIVE
Spec Grav, UA: >=1.03
Urobilinogen, UA: NEGATIVE
pH, UA: 6

## 2015-08-17 LAB — CBC WITH DIFFERENTIAL/PLATELET
Basophils Absolute: 0.1 10*3/uL (ref 0.0–0.1)
Basophils Relative: 0.4 % (ref 0.0–3.0)
EOS PCT: 0.1 % (ref 0.0–5.0)
Eosinophils Absolute: 0 10*3/uL (ref 0.0–0.7)
HEMATOCRIT: 41.7 % (ref 36.0–46.0)
HEMOGLOBIN: 13.3 g/dL (ref 12.0–15.0)
LYMPHS PCT: 12.8 % (ref 12.0–46.0)
Lymphs Abs: 2 10*3/uL (ref 0.7–4.0)
MCHC: 32 g/dL (ref 30.0–36.0)
MCV: 91.7 fl (ref 78.0–100.0)
MONO ABS: 0.8 10*3/uL (ref 0.1–1.0)
Monocytes Relative: 5.1 % (ref 3.0–12.0)
Neutro Abs: 12.6 10*3/uL — ABNORMAL HIGH (ref 1.4–7.7)
Neutrophils Relative %: 81.6 % — ABNORMAL HIGH (ref 43.0–77.0)
Platelets: 451 10*3/uL — ABNORMAL HIGH (ref 150.0–400.0)
RBC: 4.55 Mil/uL (ref 3.87–5.11)
RDW: 18.5 % — ABNORMAL HIGH (ref 11.5–15.5)
WBC: 15.4 10*3/uL — AB (ref 4.0–10.5)

## 2015-08-17 LAB — T4, FREE: FREE T4: 0.84 ng/dL (ref 0.60–1.60)

## 2015-08-17 LAB — BRAIN NATRIURETIC PEPTIDE: Pro B Natriuretic peptide (BNP): 944 pg/mL — ABNORMAL HIGH (ref 0.0–100.0)

## 2015-08-17 MED ORDER — FUROSEMIDE 40 MG PO TABS: 40.0000 mg | ORAL_TABLET | Freq: Every day | ORAL | Status: DC | PRN

## 2015-08-17 NOTE — Assessment & Plan Note (Addendum)
May have both pleural fluid and ascites Rapid weight gain must be fluid Urinalysis doesn't show significant proteinuria --so not nephrotic. But still could have renal etiology ??liver---?cirrhosis Certainly could have some CHF  Will check CXR to confirm effusions Check labs Consider echo/abdominal ultrasound depending on lab work Trial of furosemide See back next week Stop the amitriptyline for now

## 2015-08-17 NOTE — Telephone Encounter (Signed)
See below my chart message ok to wait   Appointment For: Rachel Butler, Rachel Butler ([id number])   Visit Type: MYCHART OFFICE VISIT (1064)      08/19/2015  8:00 AM 15 mins. Venia Carbon, MD   [facility]      Patient Comments:   Office Visit   Denni has a mismatch in her legs. Her right leg has    become swollen and it's making it hard to get around.

## 2015-08-17 NOTE — Progress Notes (Signed)
Pre visit review using our clinic review tool, if applicable. No additional management support is needed unless otherwise documented below in the visit note. 

## 2015-08-17 NOTE — Patient Instructions (Signed)
Please stop the amitriptyline for now. Start the furosemide daily till you come back--but stop if you lose at least 10# from where you are now.

## 2015-08-17 NOTE — Telephone Encounter (Signed)
Patient scheduled appointment on 08/17/15.

## 2015-08-17 NOTE — Progress Notes (Signed)
Subjective:    Patient ID: Rachel Butler, female    DOB: December 31, 1955, 59 y.o.   MRN: [id number]  HPI Here with husband  Having bad swelling-- mostly in legs, but now in face also Started about 2 weeks ago but worsening Weight has gone up 15# in the past short time  Has seen neurologist due to weakness (burns on arms from dropping cigarettes) Also the tremor Reviewed his note from last month (Dr Dione Booze) Only checked thyroid Elavil increased to 75mg  nightly--but then she went back to 50 This was mostly for her appetite though  Gets tightness in chest --- breathing is affected when just sitting on couch (?thoracic compression) No palpitations Sleeps flat on 2 pillows--no PND  No pain in legs --but notices it more as swelling in arms and face also No ulcers  Current Outpatient Prescriptions on File Prior to Visit  Medication Sig Dispense Refill  . amitriptyline (ELAVIL) 50 MG tablet TAKE 1 TABLET (50 MG TOTAL) BY MOUTH AT BEDTIME. 30 tablet 11  . Aspirin-Salicylamide-Caffeine (BC HEADACHE POWDER PO) Take 1 Package by mouth 2 (two) times daily. Pain    . EVZIO 0.4 MG/0.4ML SOAJ   0  . feeding supplement, RESOURCE BREEZE, (RESOURCE BREEZE) LIQD Take 1 Container by mouth 3 (three) times daily between meals. 90 Container 0  . fentaNYL (DURAGESIC - DOSED MCG/HR) 25 MCG/HR patch Place 25 mcg onto the skin every 3 (three) days.   0  . gabapentin (NEURONTIN) 800 MG tablet Take 800 mg by mouth 4 (four) times daily as needed.    Marland Kitchen HYDROcodone-acetaminophen (NORCO/VICODIN) 5-325 MG per tablet Take 1 tablet by mouth 3 (three) times daily as needed for pain.    . Multiple Vitamins-Minerals (MULTIVITAMIN WITH MINERALS) tablet Take 1 tablet by mouth daily.    . sennosides-docusate sodium (SENOKOT-S) 8.6-50 MG tablet Take 2 tablets by mouth daily.    Marland Kitchen tiZANidine (ZANAFLEX) 4 MG tablet Take 4 mg by mouth 3 (three) times daily. & 3 tablets at bedtime     No current facility-administered  medications on file prior to visit.    Allergies  Allergen Reactions  . Endoxcin [Lidocaine-Menthol]   . Nubain [Nalbuphine Hcl]   . Penicillins   . Robaxin [Methocarbamol]     Past Medical History  Diagnosis Date  . Nicotine dependence   . Cervical disc disease   . Lumbar disc disease   . Migraines     Past Surgical History  Procedure Laterality Date  . Abdominal hysterectomy  1986    right tube and ovary removed--then laparoscopy after for ?retained cyst?  . Tonsillectomy and adenoidectomy  1970  . Cesarean section  [id number]  . Cervical discectomy  1993    C5-6  . Tubal ligation      Family History  Problem Relation Age of Onset  . Cancer Mother   . COPD Father   . Heart disease Father   . Hypertension Father   . Thyroid disease Other   . Cancer Other     mother's side  . Heart disease Other     Dad's side  . Colon cancer Neg Hx     Social History   Social History  . Marital Status: Married    Spouse Name: N/A  . Number of Children: 2  . Years of Education: N/A   Occupational History  . Social research officer, government ---retired   . 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  Smoking status: Current Every Day Smoker  . Smokeless tobacco: Never Used  . Alcohol Use: No  . Drug Use: No  . Sexual Activity: Not on file   Other Topics Concern  . Not on file   Social History Narrative   [name] daughter      Has living will   Husband is health care POA   Would accept resuscitation but no prolonged ventilation   No tube feeds if cognitively unaware   Review of Systems Not eating more than usual Sleeping "a lot" Seems to be voiding normally--has slow urination in AM    Objective:   Physical Exam  Constitutional: No distress.  Puffy face  Pulmonary/Chest: Effort normal. She has no wheezes.  Decreased breath sounds at bases, dullness on left especially. Slight right basilar crackles  Abdominal: Soft. She exhibits distension. There is no  tenderness. There is no rebound.  May have ascites  Musculoskeletal:  1+ edema in legs---symmetric  Skin:  Hyperpigmented scars on arms  Psychiatric:  Somewhat flat          Assessment & Plan:

## 2015-08-17 NOTE — Telephone Encounter (Signed)
Please offer her an appointment today--doesn't sound like it should wait 2 more days

## 2015-08-19 ENCOUNTER — Ambulatory Visit: Payer: Self-pay | Admitting: Internal Medicine

## 2015-08-22 ENCOUNTER — Ambulatory Visit (INDEPENDENT_AMBULATORY_CARE_PROVIDER_SITE_OTHER): Payer: Medicare Other | Admitting: Internal Medicine

## 2015-08-22 ENCOUNTER — Encounter: Payer: Self-pay | Admitting: Internal Medicine

## 2015-08-22 VITALS — BP 138/80 | HR 97 | Temp 97.8°F | Wt 115.0 lb

## 2015-08-22 DIAGNOSIS — I509 Heart failure, unspecified: Secondary | ICD-10-CM | POA: Diagnosis not present

## 2015-08-22 DIAGNOSIS — I5032 Chronic diastolic (congestive) heart failure: Secondary | ICD-10-CM | POA: Insufficient documentation

## 2015-08-22 LAB — RENAL FUNCTION PANEL
Albumin: 3.3 g/dL — ABNORMAL LOW (ref 3.5–5.2)
BUN: 6 mg/dL (ref 6–23)
CO2: 40 mEq/L — ABNORMAL HIGH (ref 19–32)
Calcium: 9.6 mg/dL (ref 8.4–10.5)
Chloride: 94 mEq/L — ABNORMAL LOW (ref 96–112)
Creatinine, Ser: 0.73 mg/dL (ref 0.40–1.20)
GFR: 87.26 mL/min (ref >60.00)
Glucose, Bld: 83 mg/dL (ref 70–99)
PHOSPHORUS: 3.3 mg/dL (ref 2.3–4.6)
POTASSIUM: 3.5 meq/L (ref 3.5–5.1)
SODIUM: 144 meq/L (ref 135–145)

## 2015-08-22 NOTE — Progress Notes (Signed)
Pre visit review using our clinic review tool, if applicable. No additional management support is needed unless otherwise documented below in the visit note. 

## 2015-08-22 NOTE — Assessment & Plan Note (Signed)
Etiology of her acute edema does seem to be cardiac. Might have some cirrhosis--but only if secondary (I think) Modestly elevated BNP Not sure if systolic or diastolic--could determine if cath needed, etc Discussed that I could start some of the work up--but probably best to go right to cardiology--she is okay with this Will continue the furosemide but may need to cut back if she loses more weight Will check renal profile again on the diuretic

## 2015-08-22 NOTE — Progress Notes (Signed)
Subjective:    Patient ID: Rachel Butler, female    DOB: [birth year], 59 y.o.   MRN: [id number]  HPI Here with husband for follow up of generalized edema  Doing better Right leg still puffy Breathing is fine now Stomach is much better---clothes fit right again!  No chest pain No palpitations  Current Outpatient Prescriptions on File Prior to Visit  Medication Sig Dispense Refill  . Aspirin-Salicylamide-Caffeine (BC HEADACHE POWDER PO) Take 1 Package by mouth 2 (two) times daily. Pain    . EVZIO 0.4 MG/0.4ML SOAJ   0  . feeding supplement, RESOURCE BREEZE, (RESOURCE BREEZE) LIQD Take 1 Container by mouth 3 (three) times daily between meals. 90 Container 0  . fentaNYL (DURAGESIC - DOSED MCG/HR) 25 MCG/HR patch Place 25 mcg onto the skin every 3 (three) days.   0  . furosemide (LASIX) 40 MG tablet Take 1 tablet (40 mg total) by mouth daily as needed. 30 tablet 1  . gabapentin (NEURONTIN) 800 MG tablet Take 800 mg by mouth 4 (four) times daily as needed.    Marland Kitchen HYDROcodone-acetaminophen (NORCO/VICODIN) 5-325 MG per tablet Take 1 tablet by mouth 3 (three) times daily as needed for pain.    . Multiple Vitamins-Minerals (MULTIVITAMIN WITH MINERALS) tablet Take 1 tablet by mouth daily.    . sennosides-docusate sodium (SENOKOT-S) 8.6-50 MG tablet Take 2 tablets by mouth daily.    Marland Kitchen tiZANidine (ZANAFLEX) 4 MG tablet Take 4 mg by mouth 3 (three) times daily. & 3 tablets at bedtime     No current facility-administered medications on file prior to visit.    Allergies  Allergen Reactions  . Endoxcin [Lidocaine-Menthol]   . Nubain [Nalbuphine Hcl]   . Penicillins   . Robaxin [Methocarbamol]     Past Medical History  Diagnosis Date  . Nicotine dependence   . Cervical disc disease   . Lumbar disc disease   . Migraines     Past Surgical History  Procedure Laterality Date  . Abdominal hysterectomy  1986    right tube and ovary removed--then laparoscopy after for ?retained cyst?  .  Tonsillectomy and adenoidectomy  1970  . Cesarean section  [id number]  . Cervical discectomy  1993    C5-6  . Tubal ligation      Family History  Problem Relation Age of Onset  . Cancer Mother   . COPD Father   . Heart disease Father   . Hypertension Father   . Thyroid disease Other   . Cancer Other     mother's side  . Heart disease Other     Dad's side  . Colon cancer Neg Hx     Social History   Social History  . Marital Status: Married    Spouse Name: N/A  . Number of Children: 2  . Years of Education: N/A   Occupational History  . Social research officer, government ---retired   . 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 Smoking status: Current Every Day Smoker  . Smokeless tobacco: Never Used  . Alcohol Use: No  . Drug Use: No  . Sexual Activity: Not on file   Other Topics Concern  . Not on file   Social History Narrative   [name] daughter      Has living will   Husband is health care POA   Would accept resuscitation but no prolonged ventilation   No tube feeds if cognitively unaware  Review of Systems  Still smoking--husband also. Discussed the importance of stopping.  They are "working on it" Appetite is fine     Objective:   Physical Exam  Constitutional: She appears well-developed and well-nourished. No distress.  Neck: Normal range of motion. Neck supple.  Cardiovascular: Normal rate, regular rhythm and normal heart sounds.  Exam reveals no gallop.   No murmur heard. Pulmonary/Chest: Effort normal. No respiratory distress. She has no wheezes. She has no rales.  Still dull and decreased breath sounds at right base--left is clear  Abdominal: Soft. She exhibits no distension. There is no tenderness. There is no rebound.  Pretty much back to normal  Musculoskeletal:  At most trace edema  Lymphadenopathy:    She has no cervical adenopathy.          Assessment & Plan:

## 2015-08-30 ENCOUNTER — Encounter: Payer: Self-pay | Admitting: Internal Medicine

## 2015-08-30 MED ORDER — FUROSEMIDE 40 MG PO TABS: 40.0000 mg | ORAL_TABLET | Freq: Every day | ORAL | Status: DC | PRN

## 2015-09-23 DIAGNOSIS — M5440 Lumbago with sciatica, unspecified side: Secondary | ICD-10-CM | POA: Diagnosis not present

## 2015-09-23 DIAGNOSIS — M545 Low back pain: Secondary | ICD-10-CM | POA: Diagnosis not present

## 2015-09-23 DIAGNOSIS — M5030 Other cervical disc degeneration, unspecified cervical region: Secondary | ICD-10-CM | POA: Diagnosis not present

## 2015-09-23 DIAGNOSIS — M5417 Radiculopathy, lumbosacral region: Secondary | ICD-10-CM | POA: Diagnosis not present

## 2015-09-29 ENCOUNTER — Encounter: Payer: Self-pay | Admitting: Internal Medicine

## 2015-09-29 ENCOUNTER — Ambulatory Visit (INDEPENDENT_AMBULATORY_CARE_PROVIDER_SITE_OTHER): Payer: Medicare Other | Admitting: Internal Medicine

## 2015-09-29 VITALS — BP 144/92 | HR 98 | Ht 63.0 in | Wt 109.5 lb

## 2015-09-29 DIAGNOSIS — R55 Syncope and collapse: Secondary | ICD-10-CM | POA: Diagnosis not present

## 2015-09-29 DIAGNOSIS — R0789 Other chest pain: Secondary | ICD-10-CM | POA: Diagnosis not present

## 2015-09-29 DIAGNOSIS — I509 Heart failure, unspecified: Secondary | ICD-10-CM

## 2015-09-29 NOTE — Patient Instructions (Addendum)
Medication Instructions: - no changes  Labwork: - Your physician recommends that you return for a FASTING lipid profile: the day of your stress test - Monday 10/03/15- please take the order given to you today.  Procedures/Testing: - Your physician has requested that you have a lexiscan myoview. For further information please visit [email or web address]. Please follow instruction sheet, as given.  - Your physician has recommended that you wear an event monitor. Event monitors are medical devices that record the heart's electrical activity. Doctors most often [place] these monitors to diagnose arrhythmias. Arrhythmias are problems with the speed or rhythm of the heartbeat. The monitor is a small, portable device. You can wear one while you do your normal daily activities. This is usually used to diagnose what is causing palpitations/syncope (passing out).  Follow-Up: - Pending the results of your tests  Any Additional Special Instructions Will Be Listed Below (If Applicable).    [place]  Your caregiver has ordered a Stress Test with nuclear imaging. The purpose of this test is to evaluate the blood supply to your heart muscle. This procedure is referred to as a "Non-Invasive Stress Test." This is because other than having an IV started in your vein, nothing is inserted or "invades" your body. Cardiac stress tests are done to find areas of poor blood flow to the heart by determining the extent of coronary artery disease (CAD). Some patients exercise on a treadmill, which naturally increases the blood flow to your heart, while others who are  unable to walk on a treadmill due to physical limitations have a pharmacologic/chemical stress agent called Lexiscan . This medicine will mimic walking on a treadmill by temporarily increasing your coronary blood flow.   Please note: these test may take anywhere between 2-4 hours to complete  PLEASE REPORT TO [place] AT THE  FIRST DESK WILL DIRECT YOU WHERE TO GO  Date of Procedure:______Monday 11/7/16_______________________________  Arrival Time for Procedure:______7:45 am (test to start at 8:00 am)________________________  Instructions regarding medication:   ____ : Hold diabetes medication morning of procedure  ____:  Hold betablocker(s) night before procedure and morning of procedure  ____:  Hold other medications as follows:_________________________________________________________________________________________________________________________________________________________________________________________________________________________________________________________________________________________  PLEASE NOTIFY THE OFFICE AT LEAST 24 HOURS IN ADVANCE IF YOU ARE UNABLE TO KEEP YOUR APPOINTMENT.  [phone] AND  PLEASE NOTIFY NUCLEAR MEDICINE AT [facility] AT LEAST 24 HOURS IN ADVANCE IF YOU ARE UNABLE TO KEEP YOUR APPOINTMENT. [phone]  How to prepare for your Myoview test:  1. Do not eat or drink after midnight 2. No caffeine for 24 hours prior to test 3. No smoking 24 hours prior to test. 4. Your medication may be taken with water.  If your doctor stopped a medication because of this test, do not take that medication. 5. Ladies, please do not wear dresses.  Skirts or pants are appropriate. Please wear a short sleeve shirt. 6. No perfume, cologne or lotion. 7. Wear comfortable walking shoes. No heels!

## 2015-09-29 NOTE — Progress Notes (Signed)
ELECTROPHYSIOLOGY CONSULT NOTE  Patient ID: Rachel Butler, MRN: [id number], DOB/AGE: 1955-12-21 59 y.o. Admit date: (Not on file) Date of Consult: 09/29/2015  Primary Physician: Viviana Simpler, MD Primary Cardiologist: new  Chief Complaint: acute haert failure   HPI Rachel Butler is a 59 y.o. female  Referred because of an episode of acute volume overload. This was treated by her PCP with diuretics. She notes no specific association  at that time she is having orthopnea nocturnal dyspnea and peripheral edema.  She has a history of mild exercise intolerance. She smokes significantly. She has some atypical chest pains lasting seconds and described as pinching. She does not have lower extremity claudication; she does have lower extremity leg numbness and back issues for which she is on chronic narcotics  Per family history is notable for coronary disease. She also has hypertension. No lipids are available in the medical record either here or in care everywhere  Hx of + Tn in 1/16 during episode of urosepsis.  Echo EF 45-50%   ECG nml.   Past Medical History  Diagnosis Date  . Nicotine dependence   . Cervical disc disease   . Lumbar disc disease   . Migraines   . UTI (urinary tract infection)       Surgical History:  Past Surgical History  Procedure Laterality Date  . Abdominal hysterectomy  1986    right tube and ovary removed--then laparoscopy after for ?retained cyst?  . Tonsillectomy and adenoidectomy  1970  . Cesarean section  [id number]  . Cervical discectomy  1993    C5-6  . Tubal ligation    . Colonoscopy       Home Meds: Prior to Admission medications   Medication Sig Start Date End Date Taking? Authorizing Provider  Aspirin-Salicylamide-Caffeine (BC HEADACHE POWDER PO) Take 1 Package by mouth 2 (two) times daily. Pain    Historical Provider, MD  EVZIO 0.4 MG/0.4ML SOAJ  12/14/14   Historical Provider, MD  feeding supplement, RESOURCE BREEZE, (RESOURCE  BREEZE) LIQD Take 1 Container by mouth 3 (three) times daily between meals. 12/08/14   Shanker Kristeen Mans, MD  fentaNYL (DURAGESIC - DOSED MCG/HR) 25 MCG/HR patch Place 25 mcg onto the skin every 3 (three) days.  12/13/14   Historical Provider, MD  furosemide (LASIX) 40 MG tablet Take 1 tablet (40 mg total) by mouth daily as needed. 08/30/15   Venia Carbon, MD  gabapentin (NEURONTIN) 800 MG tablet Take 800 mg by mouth 4 (four) times daily as needed.    Historical Provider, MD  HYDROcodone-acetaminophen (NORCO/VICODIN) 5-325 MG per tablet Take 1 tablet by mouth 3 (three) times daily as needed for pain.    Historical Provider, MD  Multiple Vitamins-Minerals (MULTIVITAMIN WITH MINERALS) tablet Take 1 tablet by mouth daily.    Historical Provider, MD  sennosides-docusate sodium (SENOKOT-S) 8.6-50 MG tablet Take 2 tablets by mouth daily.    Historical Provider, MD  tiZANidine (ZANAFLEX) 4 MG tablet Take 4 mg by mouth 3 (three) times daily. & 3 tablets at bedtime    Historical Provider, MD    Allergies:  Allergies  Allergen Reactions  . Endoxcin [Lidocaine-Menthol]   . Nubain [Nalbuphine Hcl]   . Penicillins   . Robaxin [Methocarbamol]     Social History   Social History  . Marital Status: Married    Spouse Name: N/A  . Number of Children: 2  . Years of Education: N/A   Occupational History  .  Social research officer, government ---retired   . 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 Smoking status: Current Every Day Smoker -- 0 years    Types: Cigarettes  . Smokeless tobacco: Never Used  . Alcohol Use: No  . Drug Use: No  . Sexual Activity: Not on file   Other Topics Concern  . Not on file   Social History Narrative   [name] daughter      Has living will   Husband is health care POA   Would accept resuscitation but no prolonged ventilation   No tube feeds if cognitively unaware     Family History  Problem Relation Age of Onset  . Cancer Mother   . COPD Father   .  Heart disease Father   . Hypertension Father   . Thyroid disease Other   . Cancer Other     mother's side  . Heart disease Other     Dad's side  . Colon cancer Neg Hx      ROS:  Please see the history of present illness.     All other systems reviewed and negative.    Physical Exam: Blood pressure 144/92, pulse 98, height 5\' 3"  (1.6 m), weight 109 lb 8 oz (49.669 kg). General: Thin and somewhat cachectic Caucasian older than age appearing female in no acute distress.  the room smells like an ashtray Head: Normocephalic, atraumatic, sclera non-icteric, no xanthomas, nares are without discharge. EENT: normal  Lymph Nodes:  none Neck: Negative for carotid bruits. JVD not elevated. Back:without scoliosis kyphosis  Lungs: Clear bilaterally to auscultation without wheezes, rales, or rhonchi. Breathing is unlabored. Heart: RRR with S1 S2. No *  murmur . No rubs, or gallops appreciated. Abdomen: Soft, non-tender, non-distended with normoactive bowel sounds. No hepatomegaly. No rebound/guarding. No obvious abdominal masses. Msk:  Strength and tone appear normal for age. Extremities: No clubbing or cyanosis. No edema.  Distal pedal pulses are 2+ and equal bilaterally. Skin: Warm and Dry Neuro: Alert and oriented X 3. CN III-XII intact Grossly normal sensory and motor function . Psych:  Responds to questions appropriately with a normal affect.      Labs: Cardiac Enzymes No results for input(s): CKTOTAL, CKMB, TROPONINI in the last 72 hours. CBC Lab Results  Component Value Date   WBC 15.4* 08/17/2015   HGB 13.3 08/17/2015   HCT 41.7 08/17/2015   MCV 91.7 08/17/2015   PLT 451.0* 08/17/2015   PROTIME: No results for input(s): LABPROT, INR in the last 72 hours. Chemistry No results for input(s): NA, K, CL, CO2, BUN, CREATININE, CALCIUM, PROT, BILITOT, ALKPHOS, ALT, AST, GLUCOSE in the last 168 hours.  Invalid input(s): LABALBU Lipids No results found for: CHOL, HDL, LDLCALC,  TRIG BNP PRO B NATRIURETIC PEPTIDE (BNP)  Date/Time Value Ref Range Status  08/17/2015 12:35 PM 944.0* 0.0 - 100.0 pg/mL Final   Thyroid Function Tests: No results for input(s): TSH, T4TOTAL, T3FREE, THYROIDAB in the last 72 hours.  Invalid input(s): FREET3 Miscellaneous No results found for: DDIMER  Radiology/Studies:  No results found.  EKG: NSR 98 13/08/33 Ow normal   Assessment and Plan:  Chest pain atypical  CHF acute now resolved  Cardiomyopathy ( context of urosepsis 1/16)  Tobacco abuse  Syncope and presyncope  The patient had an episode of acute volume overload with apparently a 20 pound weight gain (20% body weight). This was treated with diuretics. Her cardiac evaluation prior to that  had demonstrated a mild cardiomyopathy in the context of urosepsis. Prior to this event she also lost a significant amount of weight reasons that were not clear.  She is now back to baseline. She is at high risk for coronary disease with smoking and blood pressure; hence, we will evaluate her LV function with concomitant perfusion imaging.  She is further advised to stop smoking.   her episodes of syncope and presyncope are abrupt. They have been relatively frequently so event recording should be helpful in elucidating mechanism. There is residual orthostatic intolerance suggesting a neurally mediated mechanism. Her ECG being normal is also a supportive of a non-arrhythmic cause      Virl Axe

## 2015-09-30 ENCOUNTER — Telehealth: Payer: Self-pay

## 2015-09-30 NOTE — Telephone Encounter (Signed)
Reviewed lexi instructions w/pt. States pre-registration called her and reviewed as well. Pt had no questions.

## 2015-10-01 ENCOUNTER — Ambulatory Visit (INDEPENDENT_AMBULATORY_CARE_PROVIDER_SITE_OTHER): Payer: Medicare Other

## 2015-10-01 DIAGNOSIS — R55 Syncope and collapse: Secondary | ICD-10-CM | POA: Diagnosis not present

## 2015-10-03 ENCOUNTER — Ambulatory Visit
Admission: RE | Admit: 2015-10-03 | Discharge: 2015-10-03 | Disposition: A | Discharge status: Home / Self Care | Payer: Medicare Other | Source: Ambulatory Visit | Attending: Internal Medicine | Admitting: Internal Medicine

## 2015-10-03 DIAGNOSIS — R0789 Other chest pain: Secondary | ICD-10-CM

## 2015-10-03 DIAGNOSIS — I509 Heart failure, unspecified: Secondary | ICD-10-CM

## 2015-10-04 ENCOUNTER — Ambulatory Visit
Admission: RE | Admit: 2015-10-04 | Discharge: 2015-10-04 | Disposition: A | Discharge status: Home / Self Care | Payer: Medicare Other | Source: Ambulatory Visit | Attending: Internal Medicine | Admitting: Internal Medicine

## 2015-10-04 DIAGNOSIS — R0789 Other chest pain: Secondary | ICD-10-CM | POA: Diagnosis not present

## 2015-10-04 DIAGNOSIS — I509 Heart failure, unspecified: Secondary | ICD-10-CM

## 2015-10-04 MED ORDER — TECHNETIUM TC 99M SESTAMIBI - CARDIOLITE
30.0000 | Freq: Once | INTRAVENOUS | Status: AC | PRN
  Administered 2015-10-04: 09:00:00 32.409 via INTRAVENOUS

## 2015-10-04 MED ORDER — TECHNETIUM TC 99M SESTAMIBI - CARDIOLITE
13.0000 | Freq: Once | INTRAVENOUS | Status: AC | PRN
  Administered 2015-10-04: 13.86 via INTRAVENOUS

## 2015-10-04 MED ORDER — REGADENOSON 0.4 MG/5ML IV SOLN
0.4000 mg | Freq: Once | INTRAVENOUS | Status: AC
  Administered 2015-10-04: 0.4 mg via INTRAVENOUS
  Filled 2015-10-04: qty 5

## 2015-10-05 LAB — NM MYOCAR MULTI W/SPECT W/WALL MOTION / EF
CHL CUP RESTING HR STRESS: 75 {beats}/min
CSEPPHR: 114 {beats}/min
LV dias vol: 74 mL
LVSYSVOL: 55 mL
Percent HR: 69 %
SDS: 0
SRS: 9
SSS: 3
TID: 1.03

## 2015-10-10 ENCOUNTER — Telehealth: Payer: Self-pay | Admitting: Internal Medicine

## 2015-10-10 MED ORDER — FUROSEMIDE 40 MG PO TABS: 40.0000 mg | ORAL_TABLET | Freq: Every day | ORAL | Status: DC

## 2015-10-10 NOTE — Telephone Encounter (Signed)
Patient went online to [email or web address] and entered this note to Dr. Silvio Pate.  Please respond and have Dee call patient.  Thanks!!  As you know, I am currently doing Heart Monitor Testing with Dr. Caryl Comes. I will be testing until 5 Dec 16. The Lasix has controlled the swelling in my legs and I would like to continue with it if possible. Dr. Caryl Comes would also like to continue (as would I) with all my current meds during the testing. Please let me know what you think. Sincerely, Colletta [place]

## 2015-10-10 NOTE — Telephone Encounter (Signed)
Spoke with patient and advised results rx sent to pharmacy by e-script  

## 2015-10-10 NOTE — Telephone Encounter (Signed)
Yes--- you should continue the lasix (send a 1 year supply)

## 2015-10-24 DIAGNOSIS — G44209 Tension-type headache, unspecified, not intractable: Secondary | ICD-10-CM | POA: Diagnosis not present

## 2015-10-24 DIAGNOSIS — R258 Other abnormal involuntary movements: Secondary | ICD-10-CM | POA: Diagnosis not present

## 2015-10-24 DIAGNOSIS — R208 Other disturbances of skin sensation: Secondary | ICD-10-CM | POA: Diagnosis not present

## 2015-11-02 ENCOUNTER — Encounter: Payer: Self-pay | Admitting: Internal Medicine

## 2015-12-09 DIAGNOSIS — M5030 Other cervical disc degeneration, unspecified cervical region: Secondary | ICD-10-CM | POA: Diagnosis not present

## 2015-12-09 DIAGNOSIS — M5417 Radiculopathy, lumbosacral region: Secondary | ICD-10-CM | POA: Diagnosis not present

## 2015-12-09 DIAGNOSIS — Z5181 Encounter for therapeutic drug level monitoring: Secondary | ICD-10-CM | POA: Diagnosis not present

## 2015-12-09 DIAGNOSIS — Z79899 Other long term (current) drug therapy: Secondary | ICD-10-CM | POA: Diagnosis not present

## 2015-12-09 DIAGNOSIS — M5440 Lumbago with sciatica, unspecified side: Secondary | ICD-10-CM | POA: Diagnosis not present

## 2015-12-09 DIAGNOSIS — M5441 Lumbago with sciatica, right side: Secondary | ICD-10-CM | POA: Diagnosis not present

## 2015-12-15 DIAGNOSIS — G5603 Carpal tunnel syndrome, bilateral upper limbs: Secondary | ICD-10-CM | POA: Diagnosis not present

## 2015-12-29 ENCOUNTER — Ambulatory Visit (INDEPENDENT_AMBULATORY_CARE_PROVIDER_SITE_OTHER): Payer: Medicare Other | Admitting: Internal Medicine

## 2015-12-29 ENCOUNTER — Encounter: Payer: Self-pay | Admitting: Internal Medicine

## 2015-12-29 VITALS — BP 110/60 | HR 116 | Temp 98.3°F | Ht 63.0 in | Wt 116.0 lb

## 2015-12-29 DIAGNOSIS — E441 Mild protein-calorie malnutrition: Secondary | ICD-10-CM

## 2015-12-29 DIAGNOSIS — I5032 Chronic diastolic (congestive) heart failure: Secondary | ICD-10-CM | POA: Diagnosis not present

## 2015-12-29 DIAGNOSIS — G629 Polyneuropathy, unspecified: Secondary | ICD-10-CM | POA: Diagnosis not present

## 2015-12-29 DIAGNOSIS — R208 Other disturbances of skin sensation: Secondary | ICD-10-CM

## 2015-12-29 DIAGNOSIS — Z0000 Encounter for general adult medical examination without abnormal findings: Secondary | ICD-10-CM

## 2015-12-29 DIAGNOSIS — G8929 Other chronic pain: Secondary | ICD-10-CM

## 2015-12-29 DIAGNOSIS — Z7189 Other specified counseling: Secondary | ICD-10-CM

## 2015-12-29 DIAGNOSIS — Z23 Encounter for immunization: Secondary | ICD-10-CM

## 2015-12-29 DIAGNOSIS — R200 Anesthesia of skin: Secondary | ICD-10-CM

## 2015-12-29 NOTE — Assessment & Plan Note (Signed)
Fluid status better with furosemide Urged her to at least cut down on cigarettes

## 2015-12-29 NOTE — Progress Notes (Signed)
Pre visit review using our clinic review tool, if applicable. No additional management support is needed unless otherwise documented below in the visit note. 

## 2015-12-29 NOTE — Assessment & Plan Note (Signed)
Eating better now but chronic low albumin

## 2015-12-29 NOTE — Progress Notes (Signed)
Subjective:    Patient ID: Rachel Butler, female    DOB: 1956-04-08, 60 y.o.   MRN: [id number]  HPI Here for Medicare wellness and follow up of chronic medical conditions Reviewed form and advanced directives Reviewed other doctors Still smokes--not willing to stop  No alcohol Tries to do some exercise 1 fall--yesterday Vision is okay. Hearing is fair Doesn't drive---husband does heavy instrumental ADLs No depression or anhedonia No apparent cognitive problems  Concern for another ruptured disc in her neck Right arm problems and NCV abnormal Has MRI scheduled by Dr Robley Fries yesterday "charley horse" type feeling in left foot when she stepped down---wound up on floor Bad stabbing pain in the foot No injury in the fall--foot slowly improving Chronic pain in feet from nerve issues. Hypersensitive in both feet Only fall this year  Seems better with the heart Weight is up some here--monitors herself at [employer] more now--more healthy eating but has been using kosher salt Will only get occasional pinching sensation in chest No SOB --just bothered by dry nasal cavities Sleeps propped on 2 pillows--- no PND No sig edema No DOE--very limited due to pain (has to sit while cooking sometimes due to the back pain) Taking furosemide  Current Outpatient Prescriptions on File Prior to Visit  Medication Sig Dispense Refill  . Aspirin-Salicylamide-Caffeine (BC HEADACHE POWDER PO) Take 1 Package by mouth 2 (two) times daily. Pain    . BIOTIN 5000 PO Take by mouth daily.    Marland Kitchen EVZIO 0.4 MG/0.4ML SOAJ   0  . feeding supplement, RESOURCE BREEZE, (RESOURCE BREEZE) LIQD Take 1 Container by mouth 3 (three) times daily between meals. 90 Container 0  . fentaNYL (DURAGESIC - DOSED MCG/HR) 25 MCG/HR patch Place 25 mcg onto the skin every 3 (three) days.   0  . furosemide (LASIX) 40 MG tablet Take 1 tablet (40 mg total) by mouth daily. 30 tablet 11  . gabapentin (NEURONTIN) 800 MG tablet  Take 800 mg by mouth 4 (four) times daily as needed.    Marland Kitchen HYDROcodone-acetaminophen (NORCO/VICODIN) 5-325 MG per tablet Take 1 tablet by mouth 3 (three) times daily as needed for pain.    . Multiple Vitamins-Minerals (MULTIVITAMIN WITH MINERALS) tablet Take 1 tablet by mouth daily.    . sennosides-docusate sodium (SENOKOT-S) 8.6-50 MG tablet Take 2 tablets by mouth daily.    Marland Kitchen tiZANidine (ZANAFLEX) 4 MG tablet Take 4 mg by mouth 3 (three) times daily. & 3 tablets at bedtime     No current facility-administered medications on file prior to visit.    Allergies  Allergen Reactions  . Endoxcin [Lidocaine-Menthol]   . Nubain [Nalbuphine Hcl]   . Penicillins   . Robaxin [Methocarbamol]     Past Medical History  Diagnosis Date  . Nicotine dependence   . Cervical disc disease   . Lumbar disc disease   . Migraines   . UTI (urinary tract infection)     Past Surgical History  Procedure Laterality Date  . Abdominal hysterectomy  1986    right tube and ovary removed--then laparoscopy after for ?retained cyst?  . Tonsillectomy and adenoidectomy  1970  . Cesarean section  [id number]  . Cervical discectomy  1993    C5-6  . Tubal ligation    . Colonoscopy      Family History  Problem Relation Age of Onset  . Cancer Mother   . COPD Father   . Heart disease Father   . Hypertension Father   .  Thyroid disease Other   . Cancer Other     mother's side  . Heart disease Other     Dad's side  . Colon cancer Neg Hx     Social History   Social History  . Marital Status: Married    Spouse Name: N/A  . Number of Children: 2  . Years of Education: N/A   Occupational History  . Social research officer, government ---retired   . 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 Smoking status: Current Every Day Smoker -- 0 years    Types: Cigarettes  . Smokeless tobacco: Never Used  . Alcohol Use: No  . Drug Use: No  . Sexual Activity: Not on file   Other Topics Concern  . Not on file    Social History Narrative   [name] daughter      Has living will   Husband is health care POA   Would accept resuscitation but no prolonged ventilation   No tube feeds if cognitively unaware    Review of Systems Sleep is not great--- chronic pain related Doesn't drive. Uses seat belts Full dentures--only wears tops Dry skin but no rash or suspicious lesions Bowels okay with senekot.  Never had mammogram.-- she does her own exam and not sure she wants mammo Some trouble with right knee--- feels stiff at times. Doesn't swell    Objective:   Physical Exam  Constitutional: She is oriented to person, place, and time. She appears well-developed. No distress.  HENT:  Mouth/Throat: Oropharynx is clear and moist. No oropharyngeal exudate.  Neck: Normal range of motion. Neck supple. No thyromegaly present.  Cardiovascular: Normal rate, regular rhythm, normal heart sounds and intact distal pulses.  Exam reveals no gallop.   No murmur heard. Pulmonary/Chest: Effort normal. No respiratory distress. She has no wheezes. She has no rales.  Decreased breath sounds but clear  Abdominal: Soft. There is no tenderness.  Musculoskeletal: She exhibits no edema.  Lymphadenopathy:    She has no cervical adenopathy.  Neurological: She is alert and oriented to person, place, and time.  President-- "Lynnea Maizes Abbe [place]" [id number] Recall 3/3  Skin: No rash noted. No erythema.  No foot lesions Mycotic toenails  Psychiatric: She has a normal mood and affect. Her behavior is normal.          Assessment & Plan:

## 2015-12-29 NOTE — Assessment & Plan Note (Signed)
In feet Likely from disc disease Will check B12, etc

## 2015-12-29 NOTE — Addendum Note (Signed)
Addended by: Despina Hidden on: 12/29/2015 04:06 PM   Modules accepted: Orders

## 2015-12-29 NOTE — Assessment & Plan Note (Signed)
I have personally reviewed the Medicare Annual Wellness questionnaire and have noted 1. The patient's medical and social history 2. Their use of alcohol, tobacco or illicit drugs 3. Their current medications and supplements 4. The patient's functional ability including ADL's, fall risks, home safety risks and hearing or visual             impairment. 5. Diet and physical activities 6. Evidence for depression or mood disorders  The patients weight, height, BMI and visual acuity have been recorded in the chart I have made referrals, counseling and provided education to the patient based review of the above and I have provided the pt with a written personalized care plan for preventive services.  I have provided you with a copy of your personalized plan for preventive services. Please take the time to review along with your updated medication list.  She is not sure about mammogram---she will set one up if she decides to go ahead Colon due 2026 No pap due to hyster Will give prevnar due to CHF

## 2015-12-29 NOTE — Assessment & Plan Note (Signed)
See social history 

## 2015-12-29 NOTE — Assessment & Plan Note (Signed)
Widespread disc disease Going back for more studies due to new arm symptoms

## 2015-12-30 LAB — RENAL FUNCTION PANEL
ALBUMIN: 4.2 g/dL (ref 3.5–5.2)
BUN: 7 mg/dL (ref 6–23)
CALCIUM: 9.6 mg/dL (ref 8.4–10.5)
CO2: 32 mEq/L (ref 19–32)
Chloride: 100 mEq/L (ref 96–112)
Creatinine, Ser: 0.76 mg/dL (ref 0.40–1.20)
GFR: 83.2 mL/min (ref >60.00)
GLUCOSE: 97 mg/dL (ref 70–99)
POTASSIUM: 3.9 meq/L (ref 3.5–5.1)
Phosphorus: 4.6 mg/dL (ref 2.3–4.6)
SODIUM: 142 meq/L (ref 135–145)

## 2015-12-30 LAB — LIPID PANEL
Cholesterol: 232 mg/dL — ABNORMAL HIGH (ref 0–200)
HDL: 33.4 mg/dL — ABNORMAL LOW (ref >39.00)
NonHDL: 198.14
Total CHOL/HDL Ratio: 7
Triglycerides: 229 mg/dL — ABNORMAL HIGH (ref 0.0–149.0)
VLDL: 45.8 mg/dL — AB (ref 0.0–40.0)

## 2015-12-30 LAB — LDL CHOLESTEROL, DIRECT: LDL DIRECT: 175 mg/dL

## 2015-12-30 LAB — VITAMIN B12: VITAMIN B 12: 572 pg/mL (ref 211–911)

## 2016-01-09 DIAGNOSIS — M5412 Radiculopathy, cervical region: Secondary | ICD-10-CM | POA: Diagnosis not present

## 2016-01-18 DIAGNOSIS — R209 Unspecified disturbances of skin sensation: Secondary | ICD-10-CM | POA: Diagnosis not present

## 2016-01-18 DIAGNOSIS — R937 Abnormal findings on diagnostic imaging of other parts of musculoskeletal system: Secondary | ICD-10-CM | POA: Diagnosis not present

## 2016-01-18 DIAGNOSIS — G44209 Tension-type headache, unspecified, not intractable: Secondary | ICD-10-CM | POA: Diagnosis not present

## 2016-01-18 DIAGNOSIS — R51 Headache: Secondary | ICD-10-CM | POA: Diagnosis not present

## 2016-01-18 DIAGNOSIS — R251 Tremor, unspecified: Secondary | ICD-10-CM | POA: Diagnosis not present

## 2016-01-18 DIAGNOSIS — M5030 Other cervical disc degeneration, unspecified cervical region: Secondary | ICD-10-CM | POA: Diagnosis not present

## 2016-01-26 DIAGNOSIS — R220 Localized swelling, mass and lump, head: Secondary | ICD-10-CM | POA: Diagnosis not present

## 2016-01-26 DIAGNOSIS — R937 Abnormal findings on diagnostic imaging of other parts of musculoskeletal system: Secondary | ICD-10-CM | POA: Diagnosis not present

## 2016-02-03 DIAGNOSIS — M5441 Lumbago with sciatica, right side: Secondary | ICD-10-CM | POA: Diagnosis not present

## 2016-02-03 DIAGNOSIS — M5030 Other cervical disc degeneration, unspecified cervical region: Secondary | ICD-10-CM | POA: Diagnosis not present

## 2016-02-03 DIAGNOSIS — M5417 Radiculopathy, lumbosacral region: Secondary | ICD-10-CM | POA: Diagnosis not present

## 2016-02-03 DIAGNOSIS — M5440 Lumbago with sciatica, unspecified side: Secondary | ICD-10-CM | POA: Diagnosis not present

## 2016-02-06 ENCOUNTER — Telehealth: Payer: Self-pay | Admitting: Internal Medicine

## 2016-02-06 DIAGNOSIS — K148 Other diseases of tongue: Secondary | ICD-10-CM

## 2016-02-06 DIAGNOSIS — R220 Localized swelling, mass and lump, head: Principal | ICD-10-CM

## 2016-02-06 NOTE — Telephone Encounter (Signed)
CT Reports placed in Dr Alla German box to see when he returns

## 2016-02-06 NOTE — Telephone Encounter (Signed)
Husband dropped off records from neck/thyroid  cb number if needed is [phone] Thanks  Placing in rx tower

## 2016-02-13 NOTE — Telephone Encounter (Signed)
ENT referral placed to evaluate area on tongue seen on CT and MRI

## 2016-02-13 NOTE — Telephone Encounter (Signed)
Appt made with Dr Richardson Landry, patient aware.

## 2016-02-21 DIAGNOSIS — Z90710 Acquired absence of both cervix and uterus: Secondary | ICD-10-CM | POA: Diagnosis not present

## 2016-02-21 DIAGNOSIS — Z79891 Long term (current) use of opiate analgesic: Secondary | ICD-10-CM | POA: Diagnosis not present

## 2016-02-21 DIAGNOSIS — Z880 Allergy status to penicillin: Secondary | ICD-10-CM | POA: Diagnosis not present

## 2016-02-21 DIAGNOSIS — F17200 Nicotine dependence, unspecified, uncomplicated: Secondary | ICD-10-CM | POA: Diagnosis not present

## 2016-02-21 DIAGNOSIS — M961 Postlaminectomy syndrome, not elsewhere classified: Secondary | ICD-10-CM | POA: Diagnosis not present

## 2016-02-21 DIAGNOSIS — Z888 Allergy status to other drugs, medicaments and biological substances status: Secondary | ICD-10-CM | POA: Diagnosis not present

## 2016-02-21 DIAGNOSIS — M5010 Cervical disc disorder with radiculopathy, unspecified cervical region: Secondary | ICD-10-CM | POA: Diagnosis not present

## 2016-02-21 DIAGNOSIS — M5412 Radiculopathy, cervical region: Secondary | ICD-10-CM | POA: Diagnosis not present

## 2016-02-21 DIAGNOSIS — M545 Low back pain: Secondary | ICD-10-CM | POA: Diagnosis not present

## 2016-02-21 DIAGNOSIS — Z79899 Other long term (current) drug therapy: Secondary | ICD-10-CM | POA: Diagnosis not present

## 2016-03-02 DIAGNOSIS — E041 Nontoxic single thyroid nodule: Secondary | ICD-10-CM | POA: Diagnosis not present

## 2016-03-02 DIAGNOSIS — R490 Dysphonia: Secondary | ICD-10-CM | POA: Diagnosis not present

## 2016-03-02 DIAGNOSIS — J392 Other diseases of pharynx: Secondary | ICD-10-CM | POA: Diagnosis not present

## 2016-06-25 DIAGNOSIS — Z885 Allergy status to narcotic agent status: Secondary | ICD-10-CM | POA: Diagnosis not present

## 2016-06-25 DIAGNOSIS — Z888 Allergy status to other drugs, medicaments and biological substances status: Secondary | ICD-10-CM | POA: Diagnosis not present

## 2016-06-25 DIAGNOSIS — M961 Postlaminectomy syndrome, not elsewhere classified: Secondary | ICD-10-CM | POA: Diagnosis not present

## 2016-06-25 DIAGNOSIS — M5030 Other cervical disc degeneration, unspecified cervical region: Secondary | ICD-10-CM | POA: Diagnosis not present

## 2016-06-25 DIAGNOSIS — J449 Chronic obstructive pulmonary disease, unspecified: Secondary | ICD-10-CM | POA: Diagnosis not present

## 2016-06-25 DIAGNOSIS — Z880 Allergy status to penicillin: Secondary | ICD-10-CM | POA: Diagnosis not present

## 2016-06-25 DIAGNOSIS — M47812 Spondylosis without myelopathy or radiculopathy, cervical region: Secondary | ICD-10-CM | POA: Diagnosis not present

## 2016-06-25 DIAGNOSIS — Z886 Allergy status to analgesic agent status: Secondary | ICD-10-CM | POA: Diagnosis not present

## 2016-06-25 DIAGNOSIS — F17200 Nicotine dependence, unspecified, uncomplicated: Secondary | ICD-10-CM | POA: Diagnosis not present

## 2016-06-28 ENCOUNTER — Ambulatory Visit (INDEPENDENT_AMBULATORY_CARE_PROVIDER_SITE_OTHER): Payer: Medicare Other | Admitting: Internal Medicine

## 2016-06-28 ENCOUNTER — Encounter: Payer: Self-pay | Admitting: Internal Medicine

## 2016-06-28 DIAGNOSIS — G8929 Other chronic pain: Secondary | ICD-10-CM | POA: Diagnosis not present

## 2016-06-28 DIAGNOSIS — E441 Mild protein-calorie malnutrition: Secondary | ICD-10-CM

## 2016-06-28 DIAGNOSIS — I5032 Chronic diastolic (congestive) heart failure: Secondary | ICD-10-CM

## 2016-06-28 DIAGNOSIS — G629 Polyneuropathy, unspecified: Secondary | ICD-10-CM

## 2016-06-28 MED ORDER — PROMETHAZINE HCL 25 MG PO TABS: ORAL_TABLET | ORAL | 1 refills | Status: DC

## 2016-06-28 NOTE — Assessment & Plan Note (Signed)
Has gained a lot of weight--but it doesn't seem to be fluid Continue the furosemide

## 2016-06-28 NOTE — Progress Notes (Signed)
Pre visit review using our clinic review tool, if applicable. No additional management support is needed unless otherwise documented below in the visit note. 

## 2016-06-28 NOTE — Progress Notes (Signed)
Subjective:    Patient ID: Rachel Butler, female    DOB: 09-Jan-1956, 60 y.o.   MRN: [id number]  HPI Here for follow up of chronic health conditions  Having right neck problems--has procedure planned there due to ongoing pain issues Just had another injection in her neck a couple of days ago--only lasted a few hours Still has pain and can hear popping when she turns her back She isn't sure exactly what is planned--?cauterizing nerves? Still with pain in low back also [place]  Weight is up May have some fluid retention--but she doesn't think it is much She is eating better No SOB Sleeps on 2 pillows in bed--but often is flat No PND Mild edema at most No chest pain  [place] about a week ago Hit upper left arm Still has some bruising and lumps in arm Current Outpatient Prescriptions on File Prior to Visit  Medication Sig Dispense Refill  . Aspirin-Salicylamide-Caffeine (BC HEADACHE POWDER PO) Take 1 Package by mouth 2 (two) times daily. Pain    . BIOTIN 5000 PO Take by mouth daily.    Marland Kitchen EVZIO 0.4 MG/0.4ML SOAJ   0  . fentaNYL (DURAGESIC - DOSED MCG/HR) 25 MCG/HR patch Place 25 mcg onto the skin every 3 (three) days.   0  . furosemide (LASIX) 40 MG tablet Take 1 tablet (40 mg total) by mouth daily. 30 tablet 11  . gabapentin (NEURONTIN) 800 MG tablet Take 800 mg by mouth 4 (four) times daily as needed.    Marland Kitchen HYDROcodone-acetaminophen (NORCO/VICODIN) 5-325 MG per tablet Take 1 tablet by mouth 3 (three) times daily as needed for pain.    . Multiple Vitamins-Minerals (MULTIVITAMIN WITH MINERALS) tablet Take 1 tablet by mouth daily.    . sennosides-docusate sodium (SENOKOT-S) 8.6-50 MG tablet Take 2 tablets by mouth daily.    Marland Kitchen tiZANidine (ZANAFLEX) 4 MG tablet Take 4 mg by mouth 3 (three) times daily. & 3 tablets at bedtime     No current facility-administered medications on file prior to visit.     Allergies  Allergen Reactions  . Endoxcin  [Lidocaine-Menthol]   . Nubain [Nalbuphine Hcl]   . Penicillins   . Robaxin [Methocarbamol]     Past Medical History:  Diagnosis Date  . Cervical disc disease   . Lumbar disc disease   . Migraines   . Nicotine dependence   . UTI (urinary tract infection)     Past Surgical History:  Procedure Laterality Date  . ABDOMINAL HYSTERECTOMY  1986   right tube and ovary removed--then laparoscopy after for ?retained cyst?  . CERVICAL DISCECTOMY  1993   C5-6  . CESAREAN SECTION  [id number]  . COLONOSCOPY    . TONSILLECTOMY AND ADENOIDECTOMY  1970  . TUBAL LIGATION      Family History  Problem Relation Age of Onset  . Cancer Mother   . COPD Father   . Heart disease Father   . Hypertension Father   . Thyroid disease Other   . Cancer Other     mother's side  . Heart disease Other     Dad's side  . Colon cancer Neg Hx     Social History   Social History  . Marital status: Married    Spouse name: N/A  . Number of children: 2  . Years of education: N/A   Occupational History  . Social research officer, government ---retired   . 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 Smoking status: Current Every Day Smoker    Years: 0.00    Types: Cigarettes  . Smokeless tobacco: Never Used  . Alcohol use No  . Drug use: No  . Sexual activity: Not on file   Other Topics Concern  . Not on file   Social History Narrative   [name] daughter      Has living will   Husband is health care POA   Would accept resuscitation but no prolonged ventilation   No tube feeds if cognitively unaware   Review of Systems Bowels are more consistent with the senna (formerly would have days ogf diarrhea after having to clear herself out) Still has occasional nausea with food--only once in a while Sleeps okay-- using melatonin OTC and it is helping Gets occasional burning in stomach--usually before eating Has cut back on cigarettes --but not ready to stop altogether    Objective:   Physical  Exam  Constitutional: She appears well-developed and well-nourished. No distress.  Neck: Normal range of motion. Neck supple.  Cardiovascular: Normal rate, regular rhythm and normal heart sounds.  Exam reveals no gallop.   No murmur heard. Pulmonary/Chest: Effort normal and breath sounds normal. No respiratory distress. She has no wheezes. She has no rales.  Musculoskeletal: She exhibits no edema.  Ecchymoses around left elbow but no tenderness and normal ROM   Lymphadenopathy:    She has no cervical adenopathy.  Skin:  Ecchymosis and small hematomas under skin in lateral left arm (discussed time course for resolution)  Psychiatric: She has a normal mood and affect. Her behavior is normal.          Assessment & Plan:

## 2016-06-28 NOTE — Assessment & Plan Note (Signed)
Ongoing pain and radiculitis from neck and back Continues with pain clinic

## 2016-06-28 NOTE — Assessment & Plan Note (Signed)
Continues with pain clinic

## 2016-06-28 NOTE — Assessment & Plan Note (Addendum)
This now seems better Okay to stop the resource

## 2016-08-23 DIAGNOSIS — M47812 Spondylosis without myelopathy or radiculopathy, cervical region: Secondary | ICD-10-CM | POA: Diagnosis not present

## 2016-08-23 DIAGNOSIS — F17200 Nicotine dependence, unspecified, uncomplicated: Secondary | ICD-10-CM | POA: Diagnosis not present

## 2016-08-23 DIAGNOSIS — Z888 Allergy status to other drugs, medicaments and biological substances status: Secondary | ICD-10-CM | POA: Diagnosis not present

## 2016-08-23 DIAGNOSIS — M5030 Other cervical disc degeneration, unspecified cervical region: Secondary | ICD-10-CM | POA: Diagnosis not present

## 2016-08-23 DIAGNOSIS — Z79899 Other long term (current) drug therapy: Secondary | ICD-10-CM | POA: Diagnosis not present

## 2016-08-23 DIAGNOSIS — Z880 Allergy status to penicillin: Secondary | ICD-10-CM | POA: Diagnosis not present

## 2016-08-23 DIAGNOSIS — J449 Chronic obstructive pulmonary disease, unspecified: Secondary | ICD-10-CM | POA: Diagnosis not present

## 2016-08-23 DIAGNOSIS — M4722 Other spondylosis with radiculopathy, cervical region: Secondary | ICD-10-CM | POA: Diagnosis not present

## 2016-09-03 DIAGNOSIS — J392 Other diseases of pharynx: Secondary | ICD-10-CM | POA: Diagnosis not present

## 2016-09-03 DIAGNOSIS — F17210 Nicotine dependence, cigarettes, uncomplicated: Secondary | ICD-10-CM | POA: Diagnosis not present

## 2016-09-03 DIAGNOSIS — E041 Nontoxic single thyroid nodule: Secondary | ICD-10-CM | POA: Diagnosis not present

## 2016-09-27 DIAGNOSIS — M5030 Other cervical disc degeneration, unspecified cervical region: Secondary | ICD-10-CM | POA: Diagnosis not present

## 2016-09-27 DIAGNOSIS — Z886 Allergy status to analgesic agent status: Secondary | ICD-10-CM | POA: Diagnosis not present

## 2016-09-27 DIAGNOSIS — M47812 Spondylosis without myelopathy or radiculopathy, cervical region: Secondary | ICD-10-CM | POA: Diagnosis not present

## 2016-09-27 DIAGNOSIS — Z885 Allergy status to narcotic agent status: Secondary | ICD-10-CM | POA: Diagnosis not present

## 2016-09-27 DIAGNOSIS — Z888 Allergy status to other drugs, medicaments and biological substances status: Secondary | ICD-10-CM | POA: Diagnosis not present

## 2016-09-27 DIAGNOSIS — Z880 Allergy status to penicillin: Secondary | ICD-10-CM | POA: Diagnosis not present

## 2016-09-27 DIAGNOSIS — Z79899 Other long term (current) drug therapy: Secondary | ICD-10-CM | POA: Diagnosis not present

## 2016-09-27 DIAGNOSIS — J449 Chronic obstructive pulmonary disease, unspecified: Secondary | ICD-10-CM | POA: Diagnosis not present

## 2016-09-27 DIAGNOSIS — M961 Postlaminectomy syndrome, not elsewhere classified: Secondary | ICD-10-CM | POA: Diagnosis not present

## 2016-09-27 DIAGNOSIS — F17200 Nicotine dependence, unspecified, uncomplicated: Secondary | ICD-10-CM | POA: Diagnosis not present

## 2016-10-16 DIAGNOSIS — M47812 Spondylosis without myelopathy or radiculopathy, cervical region: Secondary | ICD-10-CM | POA: Diagnosis not present

## 2016-10-16 DIAGNOSIS — M549 Dorsalgia, unspecified: Secondary | ICD-10-CM | POA: Diagnosis not present

## 2016-10-16 DIAGNOSIS — M545 Low back pain: Secondary | ICD-10-CM | POA: Diagnosis not present

## 2016-10-16 DIAGNOSIS — M5030 Other cervical disc degeneration, unspecified cervical region: Secondary | ICD-10-CM | POA: Diagnosis not present

## 2016-10-25 ENCOUNTER — Other Ambulatory Visit: Payer: Self-pay | Admitting: Internal Medicine

## 2016-11-08 DIAGNOSIS — M47812 Spondylosis without myelopathy or radiculopathy, cervical region: Secondary | ICD-10-CM | POA: Diagnosis not present

## 2016-11-08 DIAGNOSIS — M5030 Other cervical disc degeneration, unspecified cervical region: Secondary | ICD-10-CM | POA: Diagnosis not present

## 2016-11-08 DIAGNOSIS — Z79899 Other long term (current) drug therapy: Secondary | ICD-10-CM | POA: Diagnosis not present

## 2016-11-08 DIAGNOSIS — M961 Postlaminectomy syndrome, not elsewhere classified: Secondary | ICD-10-CM | POA: Diagnosis not present

## 2016-11-08 DIAGNOSIS — F17200 Nicotine dependence, unspecified, uncomplicated: Secondary | ICD-10-CM | POA: Diagnosis not present

## 2016-11-08 DIAGNOSIS — J449 Chronic obstructive pulmonary disease, unspecified: Secondary | ICD-10-CM | POA: Diagnosis not present

## 2016-12-20 ENCOUNTER — Other Ambulatory Visit: Payer: Self-pay | Admitting: Internal Medicine

## 2017-01-04 ENCOUNTER — Encounter: Payer: Medicare Other | Admitting: Internal Medicine

## 2017-01-09 ENCOUNTER — Encounter: Payer: Self-pay | Admitting: Internal Medicine

## 2017-01-09 ENCOUNTER — Ambulatory Visit (INDEPENDENT_AMBULATORY_CARE_PROVIDER_SITE_OTHER): Payer: Medicare Other | Admitting: Internal Medicine

## 2017-01-09 VITALS — BP 114/80 | HR 112 | Temp 98.3°F | Ht 63.0 in | Wt 101.0 lb

## 2017-01-09 DIAGNOSIS — I5032 Chronic diastolic (congestive) heart failure: Secondary | ICD-10-CM

## 2017-01-09 DIAGNOSIS — R1011 Right upper quadrant pain: Secondary | ICD-10-CM | POA: Diagnosis not present

## 2017-01-09 DIAGNOSIS — G629 Polyneuropathy, unspecified: Secondary | ICD-10-CM | POA: Diagnosis not present

## 2017-01-09 DIAGNOSIS — Z0000 Encounter for general adult medical examination without abnormal findings: Secondary | ICD-10-CM | POA: Diagnosis not present

## 2017-01-09 DIAGNOSIS — E441 Mild protein-calorie malnutrition: Secondary | ICD-10-CM | POA: Diagnosis not present

## 2017-01-09 DIAGNOSIS — Z23 Encounter for immunization: Secondary | ICD-10-CM | POA: Diagnosis not present

## 2017-01-09 DIAGNOSIS — I739 Peripheral vascular disease, unspecified: Secondary | ICD-10-CM | POA: Diagnosis not present

## 2017-01-09 DIAGNOSIS — Z7189 Other specified counseling: Secondary | ICD-10-CM

## 2017-01-09 MED ORDER — GABAPENTIN 800 MG PO TABS: 800.0000 mg | ORAL_TABLET | Freq: Three times a day (TID) | ORAL | 11 refills | Status: DC

## 2017-01-09 NOTE — Assessment & Plan Note (Signed)
See social history 

## 2017-01-09 NOTE — Assessment & Plan Note (Signed)
Will check ABIs Could be neurogenic but high risk for vascular given her smoking

## 2017-01-09 NOTE — Progress Notes (Signed)
Pre visit review using our clinic review tool, if applicable. No additional management support is needed unless otherwise documented below in the visit note. 

## 2017-01-09 NOTE — Assessment & Plan Note (Signed)
Has lost weight with her abd symptoms Hopefully will improve if we get an answer for what the problem is

## 2017-01-09 NOTE — Assessment & Plan Note (Signed)
Compensated now No changes needed 

## 2017-01-09 NOTE — Patient Instructions (Addendum)
You can add a capful of miralax (over the counter) in a full glass of water daily if your bowels stay slow. Please try an over the counter acid blocker---like omeprazole 20mg  daily on an empty stomach.

## 2017-01-09 NOTE — Assessment & Plan Note (Signed)
I have personally reviewed the Medicare Annual Wellness questionnaire and have noted 1. The patient's medical and social history 2. Their use of alcohol, tobacco or illicit drugs 3. Their current medications and supplements 4. The patient's functional ability including ADL's, fall risks, home safety risks and hearing or visual             impairment. 5. Diet and physical activities 6. Evidence for depression or mood disorders  The patients weight, height, BMI and visual acuity have been recorded in the chart I have made referrals, counseling and provided education to the patient based review of the above and I have provided the pt with a written personalized care plan for preventive services.  I have provided you with a copy of your personalized plan for preventive services. Please take the time to review along with your updated medication list.  Will give pneumovax Colon due 2026 No pap (no cervix) Prefers no mammogram Not willing to stop smoking

## 2017-01-09 NOTE — Progress Notes (Signed)
Subjective:    Patient ID: Rachel Butler, female    DOB: Apr 24, 1956, 61 y.o.   MRN: [id number]  HPI Here for Medicare wellness and follow up of chronic health conditions With husband Reviewed form and advanced directives Reviewed other doctors No alcohol Still smokes and not ready to stop  Vision is okay Some problems with hearing Did fall when backing up---sprained left wrist. No depression or anhedonia Needs help with ADLs No apparent memory problems  Having RUQ discomfort--- feels swelling and a "knot" Has lost more weight--down to 101# She eats but fills up quickly--that is not typical Then bloated after eating No regular nausea Was told some years ago that she had gallstones (at [place])  Ongoing neuropathic pain Neurologist didn't want to refill her gabapentin without a visit Has radicular pain related to neck and back--as well as spinal stenosis Has pins and needles in both feet  Ongoing follow up at the pain clinic Did have ablation of nerve in neck--this has helped Continues on the hydrocodone--mostly for low back now Ongoing disability due to pain--needs help with transfers, bathing (anything that requires standing for any time). Tries to help with cooking, etc--but limited Hasn't driven in year  No heart trouble Not holding onto fluid--CHF seems okay No SOB No chest pain or palpitations Sleeps in bed--2 pillows. No PND No syncope. Some mild dizziness  Current Outpatient Prescriptions on File Prior to Visit  Medication Sig Dispense Refill  . Aspirin-Salicylamide-Caffeine (BC HEADACHE POWDER PO) Take 1 Package by mouth 2 (two) times daily. Pain    . BIOTIN 5000 PO Take by mouth daily.    . fentaNYL (DURAGESIC - DOSED MCG/HR) 25 MCG/HR patch Place 25 mcg onto the skin every 3 (three) days.   0  . furosemide (LASIX) 40 MG tablet TAKE 1 TABLET BY MOUTH ONCE A DAY 30 tablet 0  . gabapentin (NEURONTIN) 800 MG tablet Take 800 mg by mouth 3 (three) times daily.      Marland Kitchen HYDROcodone-acetaminophen (NORCO/VICODIN) 5-325 MG per tablet Take 1 tablet by mouth 3 (three) times daily as needed for pain.    . Multiple Vitamins-Minerals (MULTIVITAMIN WITH MINERALS) tablet Take 1 tablet by mouth daily.    . promethazine (PHENERGAN) 25 MG tablet TAKE 0.5-1 TABLETS (12.5-25 MG TOTAL) BY MOUTH 3 (THREE) TIMES DAILY AS NEEDED FOR NAUSEA. 30 tablet 1  . sennosides-docusate sodium (SENOKOT-S) 8.6-50 MG tablet Take 2 tablets by mouth daily.    Marland Kitchen tiZANidine (ZANAFLEX) 4 MG tablet Take 4 mg by mouth 3 (three) times daily. & 3 tablets at bedtime     No current facility-administered medications on file prior to visit.     Allergies  Allergen Reactions  . Endoxcin [Lidocaine-Menthol]   . Nubain [Nalbuphine Hcl]   . Penicillins   . Robaxin [Methocarbamol]     Past Medical History:  Diagnosis Date  . Cervical disc disease   . Lumbar disc disease   . Migraines   . Nicotine dependence   . UTI (urinary tract infection)     Past Surgical History:  Procedure Laterality Date  . ABDOMINAL HYSTERECTOMY  1986   right tube and ovary removed--then laparoscopy after for ?retained cyst?  . CERVICAL DISCECTOMY  1993   C5-6  . CESAREAN SECTION  [id number]  . COLONOSCOPY    . TONSILLECTOMY AND ADENOIDECTOMY  1970  . TUBAL LIGATION      Family History  Problem Relation Age of Onset  . Cancer Mother   .  COPD Father   . Heart disease Father   . Hypertension Father   . Thyroid disease Other   . Cancer Other     mother's side  . Heart disease Other     Dad's side  . Colon cancer Neg Hx     Social History   Social History  . Marital status: Married    Spouse name: N/A  . Number of children: 2  . Years of education: N/A   Occupational History  . Social research officer, government ---retired   . 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 Smoking status: Current Every Day Smoker    Years: 0.00    Types: Cigarettes  . Smokeless tobacco: Never Used  . Alcohol use No  .  Drug use: No  . Sexual activity: Not on file   Other Topics Concern  . Not on file   Social History Narrative   [name] daughter      Has living will   Husband is health care POA   Would accept resuscitation but no prolonged ventilation   No tube feeds if cognitively unaware   Review of Systems Toenails are thick and long--she can't cut them. Discussed podiatrist. Some pain at times Weight down--- relates to her early satiety Doesn't wear seat belt--- "I feel suffocated" Upper dentures only---can't wear the lower ones Bowels are okay--takes the senna nightly. Now some constipation Voids well No skin rash or suspicious lesions No heartburn or dysphagia Does have more pain in right leg with walking    Objective:   Physical Exam  Constitutional: She is oriented to person, place, and time. No distress.  HENT:  Mouth/Throat: Oropharynx is clear and moist. No oropharyngeal exudate.  Edentulous with upper plate  Neck: No thyromegaly present.  Cardiovascular: Normal rate, regular rhythm and normal heart sounds.  Exam reveals no gallop.   No murmur heard. Normal pulse in left foot but absent in right  Pulmonary/Chest: Effort normal and breath sounds normal. No respiratory distress. She has no wheezes. She has no rales.  Abdominal: She exhibits no distension. There is no rebound and no guarding.  Moderate RUQ tenderness without apparent mass  Musculoskeletal: She exhibits no edema.  Lymphadenopathy:    She has no cervical adenopathy.  Neurological: She is alert and oriented to person, place, and time.  President--- "Gerarda Fraction Trump, Obama, Bush" [phone] D-l-r-[id number]-w Recall 3/3  Skin: No rash noted. No erythema.  Mycotic toenails  Psychiatric: She has a normal mood and affect. Her behavior is normal.          Assessment & Plan:

## 2017-01-09 NOTE — Addendum Note (Signed)
Addended by: Ellamae Sia on: 01/09/2017 12:46 PM   Modules accepted: Orders

## 2017-01-09 NOTE — Assessment & Plan Note (Signed)
Cervical and lumbar radiculopathy Will refill her gabapentin

## 2017-01-09 NOTE — Assessment & Plan Note (Signed)
With early satiety Will check RUQ ultrasound If ongoing problems, consider GI (if no answer from ultrasound)

## 2017-01-09 NOTE — Addendum Note (Signed)
Addended by: Pilar Grammes on: 01/09/2017 01:02 PM   Modules accepted: Orders

## 2017-01-10 ENCOUNTER — Other Ambulatory Visit (INDEPENDENT_AMBULATORY_CARE_PROVIDER_SITE_OTHER): Payer: Medicare Other

## 2017-01-10 ENCOUNTER — Telehealth: Payer: Self-pay | Admitting: Radiology

## 2017-01-10 DIAGNOSIS — E441 Mild protein-calorie malnutrition: Secondary | ICD-10-CM | POA: Diagnosis not present

## 2017-01-10 DIAGNOSIS — R1011 Right upper quadrant pain: Secondary | ICD-10-CM

## 2017-01-10 DIAGNOSIS — G629 Polyneuropathy, unspecified: Secondary | ICD-10-CM | POA: Diagnosis not present

## 2017-01-10 DIAGNOSIS — I5032 Chronic diastolic (congestive) heart failure: Secondary | ICD-10-CM

## 2017-01-10 DIAGNOSIS — D75839 Thrombocytosis, unspecified: Secondary | ICD-10-CM

## 2017-01-10 DIAGNOSIS — D473 Essential (hemorrhagic) thrombocythemia: Secondary | ICD-10-CM

## 2017-01-10 DIAGNOSIS — R946 Abnormal results of thyroid function studies: Secondary | ICD-10-CM

## 2017-01-10 DIAGNOSIS — R200 Anesthesia of skin: Secondary | ICD-10-CM

## 2017-01-10 DIAGNOSIS — D649 Anemia, unspecified: Secondary | ICD-10-CM

## 2017-01-10 LAB — COMPREHENSIVE METABOLIC PANEL
ALBUMIN: 2.7 g/dL — AB (ref 3.5–5.2)
ALT: 10 U/L (ref 0–35)
AST: 22 U/L (ref 0–37)
Alkaline Phosphatase: 149 U/L — ABNORMAL HIGH (ref 39–117)
BUN: 8 mg/dL (ref 6–23)
CALCIUM: 8.7 mg/dL (ref 8.4–10.5)
CO2: 32 mEq/L (ref 19–32)
Chloride: 93 mEq/L — ABNORMAL LOW (ref 96–112)
Creatinine, Ser: 0.69 mg/dL (ref 0.40–1.20)
GFR: 92.67 mL/min (ref >60.00)
Glucose, Bld: 91 mg/dL (ref 70–99)
Potassium: 3.8 mEq/L (ref 3.5–5.1)
Sodium: 133 mEq/L — ABNORMAL LOW (ref 135–145)
Total Bilirubin: 0.2 mg/dL (ref 0.2–1.2)
Total Protein: 6.9 g/dL (ref 6.0–8.3)

## 2017-01-10 LAB — LIPID PANEL
CHOL/HDL RATIO: 6
CHOLESTEROL: 141 mg/dL (ref 0–200)
HDL: 23.6 mg/dL — AB (ref >39.00)
LDL CALC: 95 mg/dL (ref 0–99)
NonHDL: 117.09
TRIGLYCERIDES: 108 mg/dL (ref 0.0–149.0)
VLDL: 21.6 mg/dL (ref 0.0–40.0)

## 2017-01-10 LAB — CBC WITH DIFFERENTIAL/PLATELET
BASOS ABS: 0.1 10*3/uL (ref 0.0–0.1)
Basophils Relative: 0.5 % (ref 0.0–3.0)
EOS ABS: 0 10*3/uL (ref 0.0–0.7)
Eosinophils Relative: 0 % (ref 0.0–5.0)
HEMATOCRIT: 27 % — AB (ref 36.0–46.0)
LYMPHS PCT: 9.6 % — AB (ref 12.0–46.0)
Lymphs Abs: 2 10*3/uL (ref 0.7–4.0)
MCHC: 30.6 g/dL (ref 30.0–36.0)
MCV: 79.6 fl (ref 78.0–100.0)
MONO ABS: 1.8 10*3/uL — AB (ref 0.1–1.0)
Monocytes Relative: 8.9 % (ref 3.0–12.0)
Neutro Abs: 16.4 10*3/uL — ABNORMAL HIGH (ref 1.4–7.7)
Neutrophils Relative %: 81 % — ABNORMAL HIGH (ref 43.0–77.0)
Platelets: 617 10*3/uL — ABNORMAL HIGH (ref 150.0–400.0)
RBC: 3.39 Mil/uL — AB (ref 3.87–5.11)
RDW: 17.7 % — AB (ref 11.5–15.5)

## 2017-01-10 LAB — T4, FREE: FREE T4: 2.43 ng/dL — AB (ref 0.60–1.60)

## 2017-01-10 NOTE — Telephone Encounter (Signed)
Will await full report With her abdominal pain and weight loss, will probably switch imaging to CT scan

## 2017-01-10 NOTE — Telephone Encounter (Signed)
[facility] lab called critical results, WBC - 20.3, Non critical HGB - 8.3, results given to Dr Silvio Pate

## 2017-01-11 ENCOUNTER — Other Ambulatory Visit: Payer: Self-pay | Admitting: Internal Medicine

## 2017-01-11 ENCOUNTER — Ambulatory Visit
Admission: RE | Admit: 2017-01-11 | Discharge: 2017-01-11 | Disposition: A | Discharge status: Home / Self Care | Payer: Medicare Other | Source: Ambulatory Visit | Attending: Internal Medicine | Admitting: Internal Medicine

## 2017-01-11 ENCOUNTER — Other Ambulatory Visit (INDEPENDENT_AMBULATORY_CARE_PROVIDER_SITE_OTHER): Payer: Medicare Other

## 2017-01-11 DIAGNOSIS — R1011 Right upper quadrant pain: Secondary | ICD-10-CM | POA: Insufficient documentation

## 2017-01-11 DIAGNOSIS — R200 Anesthesia of skin: Secondary | ICD-10-CM | POA: Diagnosis not present

## 2017-01-11 DIAGNOSIS — K769 Liver disease, unspecified: Secondary | ICD-10-CM | POA: Insufficient documentation

## 2017-01-11 DIAGNOSIS — R946 Abnormal results of thyroid function studies: Secondary | ICD-10-CM | POA: Diagnosis not present

## 2017-01-11 DIAGNOSIS — K869 Disease of pancreas, unspecified: Secondary | ICD-10-CM | POA: Insufficient documentation

## 2017-01-11 DIAGNOSIS — D473 Essential (hemorrhagic) thrombocythemia: Secondary | ICD-10-CM

## 2017-01-11 DIAGNOSIS — D649 Anemia, unspecified: Secondary | ICD-10-CM | POA: Diagnosis not present

## 2017-01-11 DIAGNOSIS — K8020 Calculus of gallbladder without cholecystitis without obstruction: Secondary | ICD-10-CM | POA: Insufficient documentation

## 2017-01-11 DIAGNOSIS — D75839 Thrombocytosis, unspecified: Secondary | ICD-10-CM

## 2017-01-11 LAB — IRON AND TIBC
%SAT: 3 % — ABNORMAL LOW (ref 11–50)
Iron: <10 ug/dL — ABNORMAL LOW (ref 45–160)
TIBC: 172 ug/dL — ABNORMAL LOW (ref 250–450)
UIBC: 167 ug/dL (ref 125–400)

## 2017-01-11 LAB — FERRITIN: Ferritin: 446.2 ng/mL — ABNORMAL HIGH (ref 10.0–291.0)

## 2017-01-11 LAB — VITAMIN B12: Vitamin B-12: 1039 pg/mL — ABNORMAL HIGH (ref 211–911)

## 2017-01-11 LAB — FOLATE: FOLATE: 11 ng/mL (ref >5.9)

## 2017-01-11 NOTE — Progress Notes (Unsigned)
Please get this done ASAP--I forgot to order it stat Monday or Tuesday at the latest Likely pancreatic or other cancer with liver mets (will be new diagnosis)

## 2017-01-11 NOTE — Telephone Encounter (Signed)
Spoke to the patient this morning Not only is WBC high and significant new anemia, the free T4 is very high-----suggestive of Grave's disease (which could cause the weight loss and probably elevated WBC but I am not so sure about the anemia)  Asked her to come in for additional blood work after her ultrasound this morning. Will decide on further action based on these results

## 2017-01-12 ENCOUNTER — Ambulatory Visit (HOSPITAL_COMMUNITY)
Admission: RE | Admit: 2017-01-12 | Discharge: 2017-01-12 | Disposition: A | Discharge status: Home / Self Care | Payer: Medicare Other | Source: Ambulatory Visit | Attending: Internal Medicine | Admitting: Internal Medicine

## 2017-01-12 DIAGNOSIS — K769 Liver disease, unspecified: Secondary | ICD-10-CM

## 2017-01-12 LAB — TSH+T4F+T3FREE
Free T4: 1.4 ng/dL (ref 0.82–1.77)
T3, Free: 1.8 pg/mL — ABNORMAL LOW (ref 2.0–4.4)
TSH: 0.859 u[IU]/mL (ref 0.450–4.500)

## 2017-01-12 LAB — THYROGLOBULIN ANTIBODY: Thyroglobulin Ab: <1 IU/mL (ref <2)

## 2017-01-12 MED ORDER — GADOBENATE DIMEGLUMINE 529 MG/ML IV SOLN: 10.0000 mL | Freq: Once | INTRAVENOUS | Status: DC | PRN

## 2017-01-14 ENCOUNTER — Telehealth: Payer: Self-pay | Admitting: Internal Medicine

## 2017-01-14 MED ORDER — LORAZEPAM 0.5 MG PO TABS: ORAL_TABLET | ORAL | 0 refills | Status: DC

## 2017-01-14 NOTE — Telephone Encounter (Signed)
Verified with pt which pharmacy. Left refill on voice mail at pharmacy

## 2017-01-14 NOTE — Telephone Encounter (Signed)
I had patient scheduled for MRI on Saturday at [facility] patient said she was not claustrophobic but when she tried to lay there she coukdnt breath so they had to cancel the MRI. Please call in medicine for the patient such as Zanax or valium. I will call [facility] and place as a Stat order and try to get her in ASAP.

## 2017-01-14 NOTE — Telephone Encounter (Signed)
Please send lorazepam 0.5mg   #4 x 0 1-2 before MRI and repeat prn

## 2017-01-15 ENCOUNTER — Ambulatory Visit (HOSPITAL_COMMUNITY)
Admission: RE | Admit: 2017-01-15 | Discharge: 2017-01-15 | Disposition: A | Discharge status: Home / Self Care | Payer: Medicare Other | Source: Ambulatory Visit | Attending: Internal Medicine | Admitting: Internal Medicine

## 2017-01-15 ENCOUNTER — Other Ambulatory Visit: Payer: Self-pay | Admitting: Internal Medicine

## 2017-01-15 ENCOUNTER — Encounter (HOSPITAL_COMMUNITY): Payer: Self-pay

## 2017-01-15 ENCOUNTER — Ambulatory Visit (HOSPITAL_COMMUNITY): Admission: RE | Admit: 2017-01-15 | Payer: Medicare Other | Source: Ambulatory Visit

## 2017-01-15 DIAGNOSIS — R590 Localized enlarged lymph nodes: Secondary | ICD-10-CM | POA: Insufficient documentation

## 2017-01-15 DIAGNOSIS — K769 Liver disease, unspecified: Secondary | ICD-10-CM

## 2017-01-15 DIAGNOSIS — K91841 Postprocedural hemorrhage and hematoma of a digestive system organ or structure following other procedure: Secondary | ICD-10-CM | POA: Diagnosis not present

## 2017-01-15 DIAGNOSIS — R509 Fever, unspecified: Secondary | ICD-10-CM | POA: Diagnosis not present

## 2017-01-15 MED ORDER — GADOBENATE DIMEGLUMINE 529 MG/ML IV SOLN
10.0000 mL | Freq: Once | INTRAVENOUS | Status: AC
  Administered 2017-01-15: 10 mL via INTRAVENOUS

## 2017-01-16 ENCOUNTER — Ambulatory Visit (INDEPENDENT_AMBULATORY_CARE_PROVIDER_SITE_OTHER): Payer: Medicare Other | Admitting: Internal Medicine

## 2017-01-16 ENCOUNTER — Ambulatory Visit (INDEPENDENT_AMBULATORY_CARE_PROVIDER_SITE_OTHER)
Admission: RE | Admit: 2017-01-16 | Discharge: 2017-01-16 | Disposition: A | Discharge status: Home / Self Care | Payer: Medicare Other | Source: Ambulatory Visit | Attending: Internal Medicine | Admitting: Internal Medicine

## 2017-01-16 ENCOUNTER — Encounter: Payer: Self-pay | Admitting: Internal Medicine

## 2017-01-16 VITALS — BP 100/60 | HR 105 | Temp 100.7°F | Wt 97.0 lb

## 2017-01-16 DIAGNOSIS — C787 Secondary malignant neoplasm of liver and intrahepatic bile duct: Secondary | ICD-10-CM | POA: Insufficient documentation

## 2017-01-16 NOTE — Progress Notes (Signed)
Subjective:    Patient ID: Rachel Butler, female    DOB: 1956/08/30, 61 y.o.   MRN: [id number]  HPI Here with husband to review MRI report  Has several hepatic lesions and adenopathy Discussed that this is probably solid organ advanced cancer Colon normal in 2016 Pancreas was normal Has left ovary--no comment on MRI but would probably have ascites also Pattern not really consistent with lymphoma No recent CXR but no specific lung symptoms  ??biliary tract  Current Outpatient Prescriptions on File Prior to Visit  Medication Sig Dispense Refill  . Aspirin-Salicylamide-Caffeine (BC HEADACHE POWDER PO) Take 1 Package by mouth 2 (two) times daily. Pain    . BIOTIN 5000 PO Take by mouth daily.    . fentaNYL (DURAGESIC - DOSED MCG/HR) 25 MCG/HR patch Place 25 mcg onto the skin every 3 (three) days.   0  . furosemide (LASIX) 40 MG tablet TAKE 1 TABLET BY MOUTH ONCE A DAY 30 tablet 0  . gabapentin (NEURONTIN) 800 MG tablet Take 1 tablet (800 mg total) by mouth 3 (three) times daily. 90 tablet 11  . HYDROcodone-acetaminophen (NORCO/VICODIN) 5-325 MG per tablet Take 1 tablet by mouth 3 (three) times daily as needed for pain.    Marland Kitchen LORazepam (ATIVAN) 0.5 MG tablet 1-2 tablets 1 hour before MRI. May repeat as needed. 4 tablet 0  . Multiple Vitamins-Minerals (MULTIVITAMIN WITH MINERALS) tablet Take 1 tablet by mouth daily.    . promethazine (PHENERGAN) 25 MG tablet TAKE 0.5-1 TABLETS (12.5-25 MG TOTAL) BY MOUTH 3 (THREE) TIMES DAILY AS NEEDED FOR NAUSEA. 30 tablet 1  . sennosides-docusate sodium (SENOKOT-S) 8.6-50 MG tablet Take 2 tablets by mouth daily.    Marland Kitchen tiZANidine (ZANAFLEX) 4 MG tablet Take 4 mg by mouth 3 (three) times daily. & 3 tablets at bedtime     No current facility-administered medications on file prior to visit.     Allergies  Allergen Reactions  . Endoxcin [Lidocaine-Menthol]   . Nubain [Nalbuphine Hcl]   . Penicillins   . Robaxin [Methocarbamol]     Past Medical  History:  Diagnosis Date  . Cervical disc disease   . Lumbar disc disease   . Migraines   . Nicotine dependence   . UTI (urinary tract infection)     Past Surgical History:  Procedure Laterality Date  . ABDOMINAL HYSTERECTOMY  1986   right tube and ovary removed--then laparoscopy after for ?retained cyst?  . CERVICAL DISCECTOMY  1993   C5-6  . CESAREAN SECTION  [id number]  . COLONOSCOPY    . TONSILLECTOMY AND ADENOIDECTOMY  1970  . TUBAL LIGATION      Family History  Problem Relation Age of Onset  . Cancer Mother   . COPD Father   . Heart disease Father   . Hypertension Father   . Thyroid disease Other   . Cancer Other     mother's side  . Heart disease Other     Dad's side  . Colon cancer Neg Hx     Social History   Social History  . Marital status: Married    Spouse name: N/A  . Number of children: 2  . Years of education: N/A   Occupational History  . Social research officer, government ---retired   . 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 Smoking status: Current Every Day Smoker    Years: 0.00    Types: Cigarettes  . Smokeless tobacco: Never Used  .  Alcohol use No  . Drug use: No  . Sexual activity: Not on file   Other Topics Concern  . Not on file   Social History Narrative   [name] daughter      Has living will   Husband is health care POA   Would accept resuscitation but no prolonged ventilation   No tube feeds if cognitively unaware    Review of Systems Has lost more weight Ongoing pain as discussed before    Objective:   Physical Exam  Psychiatric:  Upset but seems to understand Wants more information---she has always wanted to know everything she could about medical conditions          Assessment & Plan:

## 2017-01-16 NOTE — Progress Notes (Signed)
Pre visit review using our clinic review tool, if applicable. No additional management support is needed unless otherwise documented below in the visit note. 

## 2017-01-16 NOTE — Assessment & Plan Note (Signed)
Discussed options--- hospice and supportive care or more information and deciding if there are treatment options She wants more info Will set up biopsy

## 2017-01-17 ENCOUNTER — Other Ambulatory Visit: Payer: Self-pay | Admitting: Internal Medicine

## 2017-01-17 ENCOUNTER — Other Ambulatory Visit: Payer: Self-pay | Admitting: Radiology

## 2017-01-17 NOTE — Addendum Note (Signed)
Addended by: Pilar Grammes on: 01/17/2017 08:30 AM   Modules accepted: Orders

## 2017-01-18 ENCOUNTER — Other Ambulatory Visit: Payer: Self-pay | Admitting: Internal Medicine

## 2017-01-18 ENCOUNTER — Inpatient Hospital Stay (HOSPITAL_COMMUNITY)
Admission: RE | Admit: 2017-01-18 | Discharge: 2017-01-24 | DRG: 908 | Disposition: A | Discharge status: Home Health | Payer: Medicare Other | Source: Ambulatory Visit | Attending: Internal Medicine | Admitting: Internal Medicine

## 2017-01-18 ENCOUNTER — Ambulatory Visit (HOSPITAL_COMMUNITY)
Admission: RE | Admit: 2017-01-18 | Discharge: 2017-01-18 | Disposition: A | Discharge status: Home / Self Care | Payer: Medicare Other | Source: Ambulatory Visit | Attending: Internal Medicine | Admitting: Internal Medicine

## 2017-01-18 ENCOUNTER — Ambulatory Visit (HOSPITAL_COMMUNITY)
Admission: RE | Admit: 2017-01-18 | Discharge: 2017-01-18 | Disposition: A | Discharge status: Home / Self Care | Payer: Medicare Other | Source: Ambulatory Visit | Attending: General Surgery | Admitting: General Surgery

## 2017-01-18 ENCOUNTER — Encounter (HOSPITAL_COMMUNITY): Payer: Self-pay

## 2017-01-18 DIAGNOSIS — D649 Anemia, unspecified: Secondary | ICD-10-CM | POA: Diagnosis not present

## 2017-01-18 DIAGNOSIS — K91840 Postprocedural hemorrhage and hematoma of a digestive system organ or structure following a digestive system procedure: Secondary | ICD-10-CM

## 2017-01-18 DIAGNOSIS — C801 Malignant (primary) neoplasm, unspecified: Secondary | ICD-10-CM | POA: Diagnosis not present

## 2017-01-18 DIAGNOSIS — D72823 Leukemoid reaction: Secondary | ICD-10-CM | POA: Diagnosis present

## 2017-01-18 DIAGNOSIS — Z66 Do not resuscitate: Secondary | ICD-10-CM | POA: Diagnosis present

## 2017-01-18 DIAGNOSIS — A419 Sepsis, unspecified organism: Secondary | ICD-10-CM

## 2017-01-18 DIAGNOSIS — R109 Unspecified abdominal pain: Secondary | ICD-10-CM

## 2017-01-18 DIAGNOSIS — E876 Hypokalemia: Secondary | ICD-10-CM | POA: Diagnosis present

## 2017-01-18 DIAGNOSIS — G8929 Other chronic pain: Secondary | ICD-10-CM | POA: Diagnosis not present

## 2017-01-18 DIAGNOSIS — K769 Liver disease, unspecified: Secondary | ICD-10-CM | POA: Diagnosis not present

## 2017-01-18 DIAGNOSIS — R58 Hemorrhage, not elsewhere classified: Secondary | ICD-10-CM

## 2017-01-18 DIAGNOSIS — R0902 Hypoxemia: Secondary | ICD-10-CM | POA: Diagnosis not present

## 2017-01-18 DIAGNOSIS — K91841 Postprocedural hemorrhage and hematoma of a digestive system organ or structure following other procedure: Principal | ICD-10-CM | POA: Diagnosis present

## 2017-01-18 DIAGNOSIS — Z8249 Family history of ischemic heart disease and other diseases of the circulatory system: Secondary | ICD-10-CM

## 2017-01-18 DIAGNOSIS — F17200 Nicotine dependence, unspecified, uncomplicated: Secondary | ICD-10-CM | POA: Diagnosis present

## 2017-01-18 DIAGNOSIS — I081 Rheumatic disorders of both mitral and tricuspid valves: Secondary | ICD-10-CM | POA: Diagnosis not present

## 2017-01-18 DIAGNOSIS — K7689 Other specified diseases of liver: Secondary | ICD-10-CM | POA: Diagnosis not present

## 2017-01-18 DIAGNOSIS — R1013 Epigastric pain: Secondary | ICD-10-CM | POA: Diagnosis not present

## 2017-01-18 DIAGNOSIS — F17210 Nicotine dependence, cigarettes, uncomplicated: Secondary | ICD-10-CM | POA: Diagnosis present

## 2017-01-18 DIAGNOSIS — I959 Hypotension, unspecified: Secondary | ICD-10-CM | POA: Diagnosis not present

## 2017-01-18 DIAGNOSIS — F1120 Opioid dependence, uncomplicated: Secondary | ICD-10-CM | POA: Diagnosis present

## 2017-01-18 DIAGNOSIS — D72829 Elevated white blood cell count, unspecified: Secondary | ICD-10-CM | POA: Diagnosis not present

## 2017-01-18 DIAGNOSIS — R1110 Vomiting, unspecified: Secondary | ICD-10-CM

## 2017-01-18 DIAGNOSIS — I059 Rheumatic mitral valve disease, unspecified: Secondary | ICD-10-CM | POA: Diagnosis not present

## 2017-01-18 DIAGNOSIS — Y848 Other medical procedures as the cause of abnormal reaction of the patient, or of later complication, without mention of misadventure at the time of the procedure: Secondary | ICD-10-CM | POA: Diagnosis present

## 2017-01-18 DIAGNOSIS — Z90710 Acquired absence of both cervix and uterus: Secondary | ICD-10-CM

## 2017-01-18 DIAGNOSIS — I361 Nonrheumatic tricuspid (valve) insufficiency: Secondary | ICD-10-CM | POA: Diagnosis not present

## 2017-01-18 DIAGNOSIS — I272 Pulmonary hypertension, unspecified: Secondary | ICD-10-CM | POA: Diagnosis present

## 2017-01-18 DIAGNOSIS — I339 Acute and subacute endocarditis, unspecified: Secondary | ICD-10-CM | POA: Diagnosis not present

## 2017-01-18 DIAGNOSIS — C50919 Malignant neoplasm of unspecified site of unspecified female breast: Secondary | ICD-10-CM | POA: Diagnosis present

## 2017-01-18 DIAGNOSIS — Z825 Family history of asthma and other chronic lower respiratory diseases: Secondary | ICD-10-CM

## 2017-01-18 DIAGNOSIS — J9601 Acute respiratory failure with hypoxia: Secondary | ICD-10-CM | POA: Diagnosis not present

## 2017-01-18 DIAGNOSIS — C787 Secondary malignant neoplasm of liver and intrahepatic bile duct: Secondary | ICD-10-CM | POA: Diagnosis not present

## 2017-01-18 DIAGNOSIS — M3211 Endocarditis in systemic lupus erythematosus: Secondary | ICD-10-CM | POA: Diagnosis present

## 2017-01-18 DIAGNOSIS — D62 Acute posthemorrhagic anemia: Secondary | ICD-10-CM | POA: Diagnosis not present

## 2017-01-18 DIAGNOSIS — E872 Acidosis: Secondary | ICD-10-CM | POA: Diagnosis present

## 2017-01-18 DIAGNOSIS — R9431 Abnormal electrocardiogram [ECG] [EKG]: Secondary | ICD-10-CM | POA: Diagnosis not present

## 2017-01-18 DIAGNOSIS — I058 Other rheumatic mitral valve diseases: Secondary | ICD-10-CM

## 2017-01-18 DIAGNOSIS — R7881 Bacteremia: Secondary | ICD-10-CM

## 2017-01-18 DIAGNOSIS — M519 Unspecified thoracic, thoracolumbar and lumbosacral intervertebral disc disorder: Secondary | ICD-10-CM | POA: Diagnosis present

## 2017-01-18 DIAGNOSIS — K91872 Postprocedural seroma of a digestive system organ or structure following a digestive system procedure: Secondary | ICD-10-CM | POA: Diagnosis not present

## 2017-01-18 DIAGNOSIS — S3692XA Contusion of unspecified intra-abdominal organ, initial encounter: Secondary | ICD-10-CM | POA: Diagnosis not present

## 2017-01-18 DIAGNOSIS — I739 Peripheral vascular disease, unspecified: Secondary | ICD-10-CM

## 2017-01-18 DIAGNOSIS — G894 Chronic pain syndrome: Secondary | ICD-10-CM | POA: Diagnosis present

## 2017-01-18 DIAGNOSIS — T148XXA Other injury of unspecified body region, initial encounter: Secondary | ICD-10-CM | POA: Diagnosis not present

## 2017-01-18 DIAGNOSIS — R0489 Hemorrhage from other sites in respiratory passages: Secondary | ICD-10-CM | POA: Diagnosis not present

## 2017-01-18 DIAGNOSIS — R0602 Shortness of breath: Secondary | ICD-10-CM | POA: Diagnosis not present

## 2017-01-18 DIAGNOSIS — I340 Nonrheumatic mitral (valve) insufficiency: Secondary | ICD-10-CM | POA: Diagnosis present

## 2017-01-18 DIAGNOSIS — I38 Endocarditis, valve unspecified: Secondary | ICD-10-CM | POA: Diagnosis not present

## 2017-01-18 DIAGNOSIS — D729 Disorder of white blood cells, unspecified: Secondary | ICD-10-CM | POA: Diagnosis not present

## 2017-01-18 DIAGNOSIS — D473 Essential (hemorrhagic) thrombocythemia: Secondary | ICD-10-CM | POA: Diagnosis not present

## 2017-01-18 DIAGNOSIS — R509 Fever, unspecified: Secondary | ICD-10-CM | POA: Diagnosis present

## 2017-01-18 DIAGNOSIS — G43909 Migraine, unspecified, not intractable, without status migrainosus: Secondary | ICD-10-CM | POA: Diagnosis not present

## 2017-01-18 HISTORY — PX: IR GENERIC HISTORICAL: IMG1180011

## 2017-01-18 LAB — COMPREHENSIVE METABOLIC PANEL
ALBUMIN: 2 g/dL — AB (ref 3.5–5.0)
ALK PHOS: 186 U/L — AB (ref 38–126)
ALT: 12 U/L — AB (ref 14–54)
AST: 31 U/L (ref 15–41)
Anion gap: 16 — ABNORMAL HIGH (ref 5–15)
BUN: 5 mg/dL — AB (ref 6–20)
CO2: 34 mmol/L — AB (ref 22–32)
CREATININE: 0.86 mg/dL (ref 0.44–1.00)
Calcium: 8.7 mg/dL — ABNORMAL LOW (ref 8.9–10.3)
Chloride: 87 mmol/L — ABNORMAL LOW (ref 101–111)
GFR calc non Af Amer: >60 mL/min (ref >60)
GLUCOSE: 146 mg/dL — AB (ref 65–99)
Potassium: 2.7 mmol/L — CL (ref 3.5–5.1)
SODIUM: 137 mmol/L (ref 135–145)
Total Bilirubin: 0.7 mg/dL (ref 0.3–1.2)
Total Protein: 7.3 g/dL (ref 6.5–8.1)

## 2017-01-18 LAB — PREPARE RBC (CROSSMATCH)

## 2017-01-18 LAB — PROTIME-INR
INR: 1.11
Prothrombin Time: 14.3 seconds (ref 11.4–15.2)

## 2017-01-18 LAB — CBC
HEMATOCRIT: 35.2 % — AB (ref 36.0–46.0)
HEMATOCRIT: 29.7 % — AB (ref 36.0–46.0)
HEMOGLOBIN: 10.6 g/dL — AB (ref 12.0–15.0)
HEMOGLOBIN: 9 g/dL — AB (ref 12.0–15.0)
MCH: 24.5 pg — AB (ref 26.0–34.0)
MCH: 24.5 pg — ABNORMAL LOW (ref 26.0–34.0)
MCHC: 30.1 g/dL (ref 30.0–36.0)
MCHC: 30.3 g/dL (ref 30.0–36.0)
MCV: 81.5 fL (ref 78.0–100.0)
MCV: 80.9 fL (ref 78.0–100.0)
Platelets: 886 10*3/uL — ABNORMAL HIGH (ref 150–400)
Platelets: 1047 10*3/uL (ref 150–400)
RBC: 4.32 MIL/uL (ref 3.87–5.11)
RBC: 3.67 MIL/uL — ABNORMAL LOW (ref 3.87–5.11)
RDW: 16.8 % — AB (ref 11.5–15.5)
RDW: 17 % — ABNORMAL HIGH (ref 11.5–15.5)
WBC: 20.7 10*3/uL — ABNORMAL HIGH (ref 4.0–10.5)
WBC: 26.3 10*3/uL — ABNORMAL HIGH (ref 4.0–10.5)

## 2017-01-18 LAB — LACTIC ACID, PLASMA: LACTIC ACID, VENOUS: 2.7 mmol/L — AB (ref 0.5–1.9)

## 2017-01-18 LAB — TROPONIN I: Troponin I: 0.03 ng/mL (ref <0.03)

## 2017-01-18 LAB — ABO/RH: ABO/RH(D): A POS

## 2017-01-18 LAB — MRSA PCR SCREENING: MRSA by PCR: NEGATIVE

## 2017-01-18 LAB — APTT: APTT: 36 s (ref 24–36)

## 2017-01-18 MED ORDER — MIDAZOLAM HCL 2 MG/2ML IJ SOLN
INTRAMUSCULAR | Status: AC
Start: 2017-01-18 — End: 2017-01-19
  Filled 2017-01-18: qty 2

## 2017-01-18 MED ORDER — SODIUM CHLORIDE 0.9 % IV SOLN: Freq: Once | INTRAVENOUS | Status: DC

## 2017-01-18 MED ORDER — ACETAMINOPHEN 325 MG PO TABS: 650.0000 mg | ORAL_TABLET | Freq: Four times a day (QID) | ORAL | Status: DC | PRN

## 2017-01-18 MED ORDER — SODIUM CHLORIDE 0.9 % IV SOLN
INTRAVENOUS | Status: DC
  Administered 2017-01-18: 10 mL/h via INTRAVENOUS

## 2017-01-18 MED ORDER — FENTANYL CITRATE (PF) 100 MCG/2ML IJ SOLN
INTRAMUSCULAR | Status: AC
  Filled 2017-01-18: qty 2

## 2017-01-18 MED ORDER — SODIUM CHLORIDE 0.9 % IV SOLN
INTRAVENOUS | Status: DC
  Administered 2017-01-18 – 2017-01-19 (×2): via INTRAVENOUS

## 2017-01-18 MED ORDER — MIDAZOLAM HCL 2 MG/2ML IJ SOLN
INTRAMUSCULAR | Status: AC | PRN
  Administered 2017-01-18: 1 mg via INTRAVENOUS

## 2017-01-18 MED ORDER — FENTANYL CITRATE (PF) 100 MCG/2ML IJ SOLN
25.0000 ug | Freq: Once | INTRAMUSCULAR | Status: AC
  Administered 2017-01-18: 25 ug via INTRAVENOUS

## 2017-01-18 MED ORDER — FENTANYL CITRATE (PF) 100 MCG/2ML IJ SOLN
INTRAMUSCULAR | Status: AC | PRN
  Administered 2017-01-18: 25 ug via INTRAVENOUS

## 2017-01-18 MED ORDER — SODIUM CHLORIDE 0.9 % IV SOLN: INTRAVENOUS | Status: DC

## 2017-01-18 MED ORDER — MIDAZOLAM HCL 2 MG/2ML IJ SOLN
INTRAMUSCULAR | Status: AC
  Filled 2017-01-18: qty 2

## 2017-01-18 MED ORDER — HYDROCODONE-ACETAMINOPHEN 5-325 MG PO TABS
1.0000 | ORAL_TABLET | Freq: Four times a day (QID) | ORAL | Status: DC | PRN
  Administered 2017-01-18 – 2017-01-23 (×17): 2 via ORAL
  Administered 2017-01-23: 1 via ORAL
  Administered 2017-01-23 – 2017-01-24 (×4): 2 via ORAL
  Filled 2017-01-18 (×23): qty 2

## 2017-01-18 MED ORDER — ALBUTEROL SULFATE (2.5 MG/3ML) 0.083% IN NEBU: 2.5000 mg | INHALATION_SOLUTION | RESPIRATORY_TRACT | Status: DC | PRN

## 2017-01-18 MED ORDER — ONDANSETRON HCL 4 MG PO TABS
4.0000 mg | ORAL_TABLET | Freq: Four times a day (QID) | ORAL | Status: DC | PRN
  Administered 2017-01-21: 4 mg via ORAL
  Filled 2017-01-18: qty 1

## 2017-01-18 MED ORDER — VANCOMYCIN HCL IN DEXTROSE 1-5 GM/200ML-% IV SOLN
1000.0000 mg | INTRAVENOUS | Status: DC
  Administered 2017-01-19 (×2): 1000 mg via INTRAVENOUS
  Filled 2017-01-18 (×2): qty 200

## 2017-01-18 MED ORDER — IOPAMIDOL (ISOVUE-300) INJECTION 61%
INTRAVENOUS | Status: AC
Start: 2017-01-18 — End: 2017-01-18
  Administered 2017-01-18: 50 mL
  Filled 2017-01-18: qty 100

## 2017-01-18 MED ORDER — FENTANYL CITRATE (PF) 100 MCG/2ML IJ SOLN
25.0000 ug | INTRAMUSCULAR | Status: DC | PRN
  Administered 2017-01-18 – 2017-01-19 (×3): 25 ug via INTRAVENOUS
  Filled 2017-01-18 (×3): qty 2

## 2017-01-18 MED ORDER — GELATIN ABSORBABLE 12-7 MM EX MISC
CUTANEOUS | Status: AC
  Filled 2017-01-18: qty 1

## 2017-01-18 MED ORDER — LIDOCAINE HCL (PF) 1 % IJ SOLN
INTRAMUSCULAR | Status: AC
  Filled 2017-01-18: qty 30

## 2017-01-18 MED ORDER — SODIUM CHLORIDE 0.9 % IV SOLN
30.0000 meq | Freq: Once | INTRAVENOUS | Status: AC
  Administered 2017-01-18: 30 meq via INTRAVENOUS
  Filled 2017-01-18: qty 15

## 2017-01-18 MED ORDER — SODIUM CHLORIDE 0.9% FLUSH
3.0000 mL | Freq: Two times a day (BID) | INTRAVENOUS | Status: DC
  Administered 2017-01-21 – 2017-01-23 (×5): 3 mL via INTRAVENOUS

## 2017-01-18 MED ORDER — CHLOROPROCAINE HCL 1 % IJ SOLN
30.0000 mL | INTRAMUSCULAR | Status: AC
  Filled 2017-01-18: qty 30

## 2017-01-18 MED ORDER — SODIUM CHLORIDE 0.9% FLUSH
3.0000 mL | Freq: Two times a day (BID) | INTRAVENOUS | Status: DC
  Administered 2017-01-19 – 2017-01-23 (×7): 3 mL via INTRAVENOUS

## 2017-01-18 MED ORDER — SODIUM CHLORIDE 0.9 % IV BOLUS (SEPSIS): 1000.0000 mL | Freq: Once | INTRAVENOUS | Status: DC

## 2017-01-18 MED ORDER — GELATIN ABSORBABLE 12-7 MM EX MISC
CUTANEOUS | Status: AC
  Administered 2017-01-18: 20:00:00 via INTRA_ARTERIAL
  Filled 2017-01-18: qty 1

## 2017-01-18 MED ORDER — ONDANSETRON HCL 4 MG PO TABS: 4.0000 mg | ORAL_TABLET | Freq: Four times a day (QID) | ORAL | Status: DC | PRN

## 2017-01-18 MED ORDER — MIDAZOLAM HCL 2 MG/2ML IJ SOLN
INTRAMUSCULAR | Status: AC | PRN
  Administered 2017-01-18: 0.5 mg via INTRAVENOUS

## 2017-01-18 MED ORDER — DIPHENHYDRAMINE HCL 50 MG/ML IJ SOLN: 25.0000 mg | Freq: Once | INTRAMUSCULAR | Status: DC

## 2017-01-18 MED ORDER — ONDANSETRON HCL 4 MG/2ML IJ SOLN
4.0000 mg | Freq: Four times a day (QID) | INTRAMUSCULAR | Status: DC | PRN
  Administered 2017-01-19: 4 mg via INTRAVENOUS
  Filled 2017-01-18: qty 2

## 2017-01-18 MED ORDER — SENNOSIDES-DOCUSATE SODIUM 8.6-50 MG PO TABS: 1.0000 | ORAL_TABLET | Freq: Every evening | ORAL | Status: DC | PRN

## 2017-01-18 MED ORDER — CHLOROPROCAINE HCL 1 % IJ SOLN
1.0000 mL | INTRAMUSCULAR | Status: AC
  Administered 2017-01-18: 30 mL
  Filled 2017-01-18: qty 30

## 2017-01-18 MED ORDER — ONDANSETRON HCL 4 MG/2ML IJ SOLN: 4.0000 mg | Freq: Four times a day (QID) | INTRAMUSCULAR | Status: DC | PRN

## 2017-01-18 MED ORDER — SENNOSIDES-DOCUSATE SODIUM 8.6-50 MG PO TABS
2.0000 | ORAL_TABLET | Freq: Every day | ORAL | Status: DC
  Administered 2017-01-19 – 2017-01-23 (×5): 2 via ORAL
  Filled 2017-01-18 (×5): qty 2

## 2017-01-18 MED ORDER — ACETAMINOPHEN 325 MG PO TABS: 650.0000 mg | ORAL_TABLET | Freq: Once | ORAL | Status: DC

## 2017-01-18 MED ORDER — FENTANYL CITRATE (PF) 100 MCG/2ML IJ SOLN
INTRAMUSCULAR | Status: AC | PRN
  Administered 2017-01-18: 50 ug via INTRAVENOUS

## 2017-01-18 MED ORDER — IOPAMIDOL (ISOVUE-300) INJECTION 61%
INTRAVENOUS | Status: AC
Start: 2017-01-18 — End: 2017-01-18
  Administered 2017-01-18: 75 mL
  Filled 2017-01-18: qty 150

## 2017-01-18 MED ORDER — FUROSEMIDE 40 MG PO TABS: 40.0000 mg | ORAL_TABLET | Freq: Every day | ORAL | Status: DC

## 2017-01-18 MED ORDER — DEXTROSE 5 % IV SOLN
1.0000 g | Freq: Three times a day (TID) | INTRAVENOUS | Status: DC
  Administered 2017-01-18 – 2017-01-21 (×9): 1 g via INTRAVENOUS
  Filled 2017-01-18 (×11): qty 1

## 2017-01-18 NOTE — Progress Notes (Signed)
Patient complaining of 11/10 same acute pain patient described earlier.  RN spoke with Dr. Hal Hope and per MD ok to give a dose of 12mcg of Fentanyl now.

## 2017-01-18 NOTE — Procedures (Signed)
Liver mets, unknown primary  S/p [place] LEFT LIVER MASS CORE BX  No comp Stable EBL <5CC PATH PENDING Full report in PACS

## 2017-01-18 NOTE — Sedation Documentation (Signed)
Patient denies pain and is resting comfortably.  

## 2017-01-18 NOTE — Significant Event (Signed)
Rapid Response Event Note   Called by [place], RN for bleeding in right groin s/p right groin visceral angios and left hepatic artery gelfoam embolization for acute perihepatic hemorrhage after left hepatic mass bx.  Overview: Time Called: 2200 Arrival Time: 2205 Event Type: Other (Comment) (Right groin bleeding)  Initial Focused Assessment: Pt lying in bed with pressure being held to right groin site.  Pt awake, oriented x 4 c/o abdominal pain 10/10.  VS:  142/86  ST 115 13 99%  On 2l [place]. Palpable 2+  pedal pulses bilaterally.  Husband at bedside.    Interventions: Pressure held to right groin times 30 minutes until bleeding ceased. Reinforced dressing changed with pressure dressing applied over initial  2 x 2 dressing.  Dr Hal Hope notified of bleeding and came to bedside. CBC pending after blood transfusion. Husband and pt updated on POC.  Hand off report to [place], RN  Plan of Care (if not transferred): Continue to monitor right groin for reoccurrence of bleeding  q 15 minutes  X 4, q 30 minutes  X 4, then hourly.  Maintain strict bed rest for  four hours. Monitor BP and HR with groin checks.  Medicate  Abdominal pain per order. Call for reoccurrence of bleeding or any concerns.  Event Summary: Name of Physician Notified: Dr Hal Hope at 2215    at    Outcome: Stayed in room and stabalized  Event End Time: 1900  Ester Rink

## 2017-01-18 NOTE — Progress Notes (Signed)
Pharmacy Antibiotic Note  Rachel Butler is a 61 y.o. female with possible sepsis . Pharmacy consulted to dose vancomycin and azactam (PCN allergy noted; no details are available) -WBC= 36.3, tmax= 101.1, SCr= 0.69, CrCl ~ 50 (due to weight)  Plan: -Azactam 1gm IV q8h -Vancomycin 1000mg  IV x1 Q24hr -Will follow renal function, cultures and clinical progress   Height: 5\' 3"  (160 cm) Weight: 97 lb (44 kg) IBW/kg (Calculated) : 52.4  Temp (24hrs), Avg:100.2 F (37.9 C), Min:99 F (37.2 C), Max:101.1 F (38.4 C)   Recent Labs Lab 01/18/17 1134 01/18/17 1558  WBC 20.7* 26.3*    Estimated Creatinine Clearance: 53.2 mL/min (by C-G formula based on SCr of 0.69 mg/dL).    Allergies  Allergen Reactions  . Endoxcin [Lidocaine-Menthol]   . Nubain [Nalbuphine Hcl]   . Penicillins   . Robaxin [Methocarbamol]     Antimicrobials this admission: 2/23 azactam 2/23 vancomycin   Dose adjustments this admission:   Microbiology results: 2/23 blood x2 2/23 urine   Thank you for allowing pharmacy to be a part of this patient's care.  Hildred Laser, Pharm D 01/18/2017 5:33 PM

## 2017-01-18 NOTE — H&P (Signed)
Chief Complaint: liver lesion  Referring Physician:Dr. Viviana Simpler  Supervising Physician: Daryll Brod  Patient Status: [facility] - Out-pt  HPI: Rachel Butler is an 61 y.o. female who saw her PCP as she has been having abdominal pain for the last couple of months.  She has just recently starting having belching and nausea.  She was seen on 2/14 for her routine wellness and complained of some RUQ discomfort.  She started out with a RUQ [place] which revealed multiple hepatic lesions.  She was also noted to possibly have a pancreatic mass.  She then had a MR of her abdomen which reveals:  1. Examination is positive for multifocal heterogeneous enhancing liver lesions worrisome for diffuse hepatic metastasis. Correlation with tissue sampling is advised. The dominant mass appears contiguous with the gallbladder fundus. Cannot rule out gallbladder carcinoma or other GI primary. 2. Enlarged portacaval and porta hepatic lymph nodes worrisome for metastatic adenopathy.  She presents today for a biopsy to determine etiology.  Past Medical History:  Past Medical History:  Diagnosis Date  . Cervical disc disease   . Lumbar disc disease   . Migraines   . Nicotine dependence   . UTI (urinary tract infection)     Past Surgical History:  Past Surgical History:  Procedure Laterality Date  . ABDOMINAL HYSTERECTOMY  1986   right tube and ovary removed--then laparoscopy after for ?retained cyst?  . CERVICAL DISCECTOMY  1993   C5-6  . CESAREAN SECTION  [id number]  . COLONOSCOPY    . TONSILLECTOMY AND ADENOIDECTOMY  1970  . TUBAL LIGATION      Family History:  Family History  Problem Relation Age of Onset  . Cancer Mother   . COPD Father   . Heart disease Father   . Hypertension Father   . Thyroid disease Other   . Cancer Other     mother's side  . Heart disease Other     Dad's side  . Colon cancer Neg Hx     Social History:  reports that she has been smoking Cigarettes.  She  has smoked for the past 0.00 years. She has never used smokeless tobacco. She reports that she does not drink alcohol or use drugs.  Allergies:  Allergies  Allergen Reactions  . Endoxcin [Lidocaine-Menthol]   . Nubain [Nalbuphine Hcl]   . Penicillins   . Robaxin [Methocarbamol]     Medications: Medications reviewed in epic  Please HPI for pertinent positives, otherwise complete 10 system ROS negative.  Mallampati Score: MD Evaluation Airway: WNL Heart: WNL Abdomen: Other (comments) Abdomen comments: hepatomegaly Chest/ Lungs: WNL ASA  Classification: 2 Mallampati/Airway Score: Two  Physical Exam: BP 138/86   Pulse (!) 113   Temp 99 F (37.2 C)   Resp 16   Ht 5\' 3"  (1.6 m)   Wt 97 lb (44 kg)   SpO2 94%   BMI 17.18 kg/m  Body mass index is 17.18 kg/m. General: pleasant, frail appearing white female who is laying in bed in NAD HEENT: head is normocephalic, atraumatic.  Sclera are noninjected.  PERRL.  Ears and nose without any masses or lesions.  Mouth is pink and moist Heart: regular, rate, and rhythm.  Normal s1,s2. No obvious murmurs, gallops, or rubs noted.  Palpable radial and pedal pulses bilaterally Lungs: CTAB, no wheezes, rhonchi, or rales noted.  Respiratory effort nonlabored Abd: soft, tender in RUQ, ND, +BS, no masses, hernias.  Hepatomegaly present Psych: A&Ox3 with an appropriate affect.  Labs: Results for orders placed or performed during the hospital encounter of 01/18/17 (from the past 48 hour(s))  APTT upon arrival     Status: None   Collection Time: 01/18/17 11:34 AM  Result Value Ref Range   aPTT 36 24 - 36 seconds  CBC upon arrival     Status: Abnormal   Collection Time: 01/18/17 11:34 AM  Result Value Ref Range   WBC 20.7 (H) 4.0 - 10.5 K/uL   RBC 4.32 3.87 - 5.11 MIL/uL   Hemoglobin 10.6 (L) 12.0 - 15.0 g/dL   HCT 35.2 (L) 36.0 - 46.0 %   MCV 81.5 78.0 - 100.0 fL   MCH 24.5 (L) 26.0 - 34.0 pg   MCHC 30.1 30.0 - 36.0 g/dL   RDW 16.8  (H) 11.5 - 15.5 %   Platelets 886 (H) 150 - 400 K/uL  Protime-INR upon arrival     Status: None   Collection Time: 01/18/17 11:34 AM  Result Value Ref Range   Prothrombin Time 14.3 11.4 - 15.2 seconds   INR 1.11     Imaging: Dg Chest 2 View  Result Date: 01/16/2017 CLINICAL DATA:  Liver metastases.  Tobacco use. EXAM: CHEST  2 VIEW COMPARISON:  August 25, 2015 FINDINGS: There are nipple shadows bilaterally. There is no evident edema or consolidation. No pulmonary nodular lesion is demonstrable by radiography. Heart size and pulmonary vascularity are normal. No adenopathy. There is atherosclerotic calcification in the aorta. No blastic or lytic bone lesions. IMPRESSION: Aortic atherosclerosis. No edema or consolidation. No evident mass or adenopathy. Electronically Signed   By: Lowella Grip III M.D.   On: 01/16/2017 14:34    Assessment/Plan 1. Liver lesions -we will plan to proceed with a liver lesion biopsy -WBC is up at 20K , but this appears to be chronic for her and not new.  Likely related to oncologic process -Risks and Benefits discussed with the patient including, but not limited to bleeding, infection, damage to adjacent structures or low yield requiring additional tests. All of the patient's questions were answered, patient is agreeable to proceed. Consent signed and in chart.   Thank you for this interesting consult.  I greatly enjoyed meeting SHANAH TULLOCH and look forward to participating in their care.  A copy of this report was sent to the requesting provider on this date.  Electronically Signed: Henreitta Cea 01/18/2017, 12:24 PM   I spent a total of  30 Minutes   in face to face in clinical consultation, greater than 50% of which was counseling/coordinating care for liver lesions

## 2017-01-18 NOTE — Sedation Documentation (Signed)
Patient is resting comfortably. 

## 2017-01-18 NOTE — Progress Notes (Signed)
Came to see patient s/p liver lesion bx.  She is having abdominal pain and required more fentanyl.  She is currently febrile at 101.4.  She is tachy in the 130-140s which is new for her.  She is on  at 2L as per the RN she desaturated to 84% on room air, but denies wearing O2 at home.  She denies being SOB here.  She complaints of centralized abdominal pain and on the left side.  Belching or taking a deep breath hurts.  She also states she feels the need to void and has attempted twice with no success.  Heart: tachy, sounds regular Lungs: CTAB Abd: soft, tender central and LUQ, hypoactive BS, bx site is dry  A/P: S/p liver lesion bx -given new onset tachycardia, I will obtain an EKG to rule out new arrhythmia. -She is likely having desaturation because it hurts to breath.  She is sating 100% currently on 2L. -she does take lasix and feels the need to void, but is currently unable.  If this persists, she may need to be bladder scanned. -BP is stable.   -no overt evidence of bleeding s/p procedure, but will follow HR, BP, and may need a CBC is pain persists to rule out a drop in hgb. -will continue to follow and await results from EKG, etc.   ADDENDUM:  EKG, sinus tach with unspecific ST abnormality which is new from last EKG I have which is 1.59 yrs old.   Given fevers, tachycardia, some hypotension, inability to void, etc. We will admit the patient to tele.  I am going to have her bladder scanned to see how much urine she is currently retaining. Glendoris Nodarse E 3:48 PM 01/18/2017

## 2017-01-18 NOTE — Progress Notes (Signed)
CRITICAL VALUE ALERT  Critical value received:  [id number]    Date of notification: 01/18/17   Time of notification:  1805  Critical value read back:Yes.    Nurse who received alert:  Nechama Guard, RN  MD notified (1st page):  Dr. Sloan Leiter  Time of first page:  MD at nurses station  Time MD responded:  1805

## 2017-01-18 NOTE — Progress Notes (Addendum)
Bladder scan 400cc and Carol Ada notified

## 2017-01-18 NOTE — Progress Notes (Signed)
Dr Annamaria Boots notified of client c/o abd pain and heart rate and temp and orders noted

## 2017-01-18 NOTE — H&P (Addendum)
HISTORY AND PHYSICAL       PATIENT DETAILS Name: Rachel Butler Age: 61 y.o. Sex: female Date of Birth: [birth year] Admit Date: (Not on file) [place]:[id number] Silvio Pate, MD   Patient coming from: Short stay-came for a liver bx (lives at home with husband)   CHIEF COMPLAINT:  Fever, Tachycardia, worsening RUQ pain after liver biopsy  HPI: Rachel Butler is a 61 y.o. female with medical history significant of degenerative disc disease, chronic pain synd-followed by pain management-long standing hx of tobacco use-referred for admission-following liver biopsy-as she developed fever, tachycardia, hypotension and worsening RUQ pain.  Per patient approximately a month back-she noted a "knot" in her right mid abdominal area-she initially attributed this to gallstones. However its slowly started causing her to have pain in the RUQ area, since the pain worsened she saw her PCP earlier this month. A RUQ ultrasound showed multiple liver masses, this was followed by a MRI which confirmed above. She was then referred to IR for a biopsy-which was done today. She claims that she did not have fever, vomiting or diarrhea. She also denied cough.  Because post biopsy patient developed fever, tachycardia, hypotension and worsening RUQ pain-TRH was consulted for admission  +approximately 20 lb weight loss over past 6 months-unintentional +early satiety +loss of appetite.   ED Course:  During my evaluation at short stay-initially with stable BP but tachy upto 120-130's-Bolus IV NS 1 Litre started-subsequently after a large BM/Urination-patient became pale, diaphoretic and hypotensive.   Note: Lives at: Home Mobility: Independent Chronic Indwelling Foley:no   REVIEW OF SYSTEMS:  Constitutional:   No Fevers, chills  HEENT:    No headaches, Dysphagia,Tooth/dental problems,Sore throat,  No sneezing, itching, ear ache, nasal congestion, post nasal drip  Cardio-vascular: No chest  pain,Orthopnea, PND,lower extremity edema, anasarca, palpitations  GI:  No heartburn, indigestion,  diarrhea, melena or hematochezia  Resp: No shortness of breath, cough, hemoptysis,plueritic chest pain.   Skin:  No rash or lesions.  GU:  No dysuria, change in color of urine, no urgency or frequency.   Musculoskeletal: No joint pain or swelling.  No decreased range of motion.  Endocrine: No heat intolerance, no cold intolerance, no polyuria, no polydipsia  Psych: No change in mood or affect.    ALLERGIES:   Allergies  Allergen Reactions  . Endoxcin [Lidocaine-Menthol]   . Nubain [Nalbuphine Hcl]   . Penicillins   . Robaxin [Methocarbamol]     PAST MEDICAL HISTORY: Past Medical History:  Diagnosis Date  . Cervical disc disease   . Lumbar disc disease   . Migraines   . Nicotine dependence   . UTI (urinary tract infection)     PAST SURGICAL HISTORY: Past Surgical History:  Procedure Laterality Date  . ABDOMINAL HYSTERECTOMY  1986   right tube and ovary removed--then laparoscopy after for ?retained cyst?  . CERVICAL DISCECTOMY  1993   C5-6  . CESAREAN SECTION  [id number]  . COLONOSCOPY    . TONSILLECTOMY AND ADENOIDECTOMY  1970  . TUBAL LIGATION      MEDICATIONS AT HOME: Prior to Admission medications   Medication Sig Start Date End Date Taking? Authorizing Provider  Aspirin-Salicylamide-Caffeine (BC HEADACHE POWDER PO) Take 1 Package by mouth 2 (two) times daily. Pain   Yes Historical Provider, MD  BIOTIN 5000 PO Take by mouth daily.   Yes Historical Provider, MD  fentaNYL (DURAGESIC - DOSED MCG/HR) 25 MCG/HR patch Place 25 mcg onto the skin every  3 (three) days.  12/13/14  Yes Historical Provider, MD  furosemide (LASIX) 40 MG tablet TAKE 1 TABLET BY MOUTH ONCE A DAY 12/20/16  Yes Venia Carbon, MD  gabapentin (NEURONTIN) 800 MG tablet Take 1 tablet (800 mg total) by mouth 3 (three) times daily. 01/09/17  Yes Venia Carbon, MD  HYDROcodone-acetaminophen  (NORCO/VICODIN) 5-325 MG per tablet Take 1 tablet by mouth 3 (three) times daily as needed for pain.   Yes Historical Provider, MD  LORazepam (ATIVAN) 0.5 MG tablet 1-2 tablets 1 hour before MRI. May repeat as needed. 01/14/17  Yes Venia Carbon, MD  Multiple Vitamins-Minerals (MULTIVITAMIN WITH MINERALS) tablet Take 1 tablet by mouth daily.   Yes Historical Provider, MD  promethazine (PHENERGAN) 25 MG tablet TAKE 0.5-1 TABLETS (12.5-25 MG TOTAL) BY MOUTH 3 (THREE) TIMES DAILY AS NEEDED FOR NAUSEA. 06/28/16  Yes Venia Carbon, MD  sennosides-docusate sodium (SENOKOT-S) 8.6-50 MG tablet Take 2 tablets by mouth daily.   Yes Historical Provider, MD    FAMILY HISTORY: Family History  Problem Relation Age of Onset  . Cancer Mother   . COPD Father   . Heart disease Father   . Hypertension Father   . Thyroid disease Other   . Cancer Other     mother's side  . Heart disease Other     Dad's side  . Colon cancer Neg Hx     SOCIAL HISTORY:  reports that she has been smoking Cigarettes.  She has smoked for the past 0.00 years. She has never used smokeless tobacco. She reports that she does not drink alcohol or use drugs.  PHYSICAL EXAM: Blood pressure 113/74, pulse (!) 129, temperature (!) 101.1 F (38.4 C), resp. rate 16, height 5\' 3"  (1.6 m), weight 44 kg (97 lb), SpO2 98 %.  General appearance :Awake, alert, not in any distress. Speech Clear. Looks pale Eyes:, pupils equally reactive to light and accomodation,no scleral icterus. HEENT: Atraumatic and Normocephalic Neck: supple, no JVD. No cervical lymphadenopathy. Resp:Good air entry bilaterally, no added sounds  CVS: S1 S2 regular, tachy GI: Bowel sounds present,Tender RUQ area-but rest of the abdomen is soft and non tender without any peritoneal signs  Extremities: B/L Lower Ext shows no edema, both legs are warm to touch Neurology:  speech clear,Non focal, sensation is grossly intact. Psychiatric: Normal judgment and insight. Alert  and oriented x 3.  Musculoskeletal:gait appears to be normal.No digital cyanosis Skin:No Rash, warm and dry Wounds:N/A  LABS ON ADMISSION:  I have personally reviewed following labs and imaging studies  CBC:  Recent Labs Lab 01/18/17 1134  WBC 20.7*  HGB 10.6*  HCT 35.2*  MCV 81.5  PLT 886*    Basic Metabolic Panel: No results for input(s): NA, K, CL, CO2, GLUCOSE, BUN, CREATININE, CALCIUM, MG, PHOS in the last 168 hours.  GFR: Estimated Creatinine Clearance: 53.2 mL/min (by C-G formula based on SCr of 0.69 mg/dL).  Liver Function Tests: No results for input(s): AST, ALT, ALKPHOS, BILITOT, PROT, ALBUMIN in the last 168 hours. No results for input(s): LIPASE, AMYLASE in the last 168 hours. No results for input(s): AMMONIA in the last 168 hours.  Coagulation Profile:  Recent Labs Lab 01/18/17 1134  INR 1.11    Cardiac Enzymes: No results for input(s): CKTOTAL, CKMB, CKMBINDEX, TROPONINI in the last 168 hours.  BNP (last 3 results) No results for input(s): PROBNP in the last 8760 hours.  HbA1C: No results for input(s): HGBA1C in the last 72 hours.  CBG: No results for input(s): GLUCAP in the last 168 hours.  Lipid Profile: No results for input(s): CHOL, HDL, LDLCALC, TRIG, CHOLHDL, LDLDIRECT in the last 72 hours.  Thyroid Function Tests: No results for input(s): TSH, T4TOTAL, FREET4, T3FREE, THYROIDAB in the last 72 hours.  Anemia Panel: No results for input(s): VITAMINB12, FOLATE, FERRITIN, TIBC, IRON, RETICCTPCT in the last 72 hours.  Urine analysis:    Component Value Date/Time   COLORURINE RED (A) 12/05/2014 1112   APPEARANCEUR CLOUDY (A) 12/05/2014 1112   LABSPEC 1.026 12/05/2014 1112   PHURINE 5.0 12/05/2014 1112   GLUCOSEU NEGATIVE 12/05/2014 1112   HGBUR NEGATIVE 12/05/2014 1112   BILIRUBINUR neg 08/17/2015 1211   KETONESUR 15 (A) 12/05/2014 1112   PROTEINUR 1+ 08/17/2015 1211   PROTEINUR 30 (A) 12/05/2014 1112   UROBILINOGEN negative  08/17/2015 1211   UROBILINOGEN 1.0 12/05/2014 1112   NITRITE neg 08/17/2015 1211   NITRITE POSITIVE (A) 12/05/2014 1112   LEUKOCYTESUR Negative 08/17/2015 1211    Sepsis Labs: Lactic Acid, Venous    Component Value Date/Time   LATICACIDVEN 3.85 (H) 12/05/2014 1133     Microbiology: No results found for this or any previous visit (from the past 240 hour(s)).    RADIOLOGIC STUDIES ON ADMISSION: US Biopsy  Result Date: 01/18/2017 INDICATION: LIVER METASTASES, UNKNOWN PRIMARY. EXAM: ULTRASOUND BIOPSY CORE LIVER MEDICATIONS: 1% LIDOCAINE ANESTHESIA/SEDATION: Moderate (conscious) sedation was employed during this procedure. A total of Versed 1.0 mg and Fentanyl 50 mcg was administered intravenously. Moderate Sedation Time: 10 minutes. The patient's level of consciousness and vital signs were monitored continuously by radiology nursing throughout the procedure under my direct supervision. FLUOROSCOPY TIME:  Fluoroscopy Time: NONE. COMPLICATIONS: None immediate. PROCEDURE: Informed written consent was obtained from the patient after a thorough discussion of the procedural risks, benefits and alternatives. All questions were addressed. Maximal Sterile Barrier Technique was utilized including caps, mask, sterile gowns, sterile gloves, sterile drape, hand hygiene and skin antiseptic. A timeout was performed prior to the initiation of the procedure. Previous imaging reviewed. Preliminary ultrasound performed. A solid left hepatic lobe lesion was localized from a subxiphoid window. This was correlated with the abdominal MRI. Overlying skin was marked. Under sterile conditions and local anesthesia, a 17 gauge 6.8 cm access needle was advanced from an anterior subxiphoid approach into the left hepatic mass. Needle position confirmed with ultrasound. Three 1 cm 18 gauge core biopsies obtained. Samples placed in formalin. These were intact and non fragmented. Needle tract embolized with Gel-Foam. Postprocedure  imaging demonstrates no hemorrhage or hematoma. Patient tolerated the biopsy well. IMPRESSION: Successful ultrasound left hepatic mass 18 gauge core biopsy Electronically Signed   By: Jerilynn Mages.  Shick M.D.   On: 01/18/2017 14:53    I have personally reviewed images of chest xray-no obvious infiltrate  EKG:  Personally reviewed. Sinus tachycardia  ASSESSMENT AND PLAN: Hypotension either 2/2 Sepsis vs bleeding post liver biopsy: started IVF- 1 litre bolus-BP improving. Have ordered stat Labs-including Lactate, CBC/CMET, blood/urine cultures, CXR. Empiric Vanco/Azactam has also been ordered. Since she is s/p liver biopsy-post biopsy bleeding is also in the differential-hence a repeat RUQ USG, type/screen and CBC have been ordered and are currently pending. Since patient's BP seems to be stabilizing-will for now plan to admit to SDU. Depending on her pending labs and clinical course we can escalate or de-escalate care. IR will follow as well. Liver biopsy results will need to be followed.  Addendum 5:555 pm Spoke with Dr Kris Hartmann [facility] +for hematoma-IR  will take urgently for embolization.Repeat CBC with Hb decrease to 9.0-given hypotension-will go ahead and transfuse 1 unit of PRBC.K also low-will start supplementation  Multiple Liver Lesions: high suspicion for a malignancy-await bx results. Long hx of tobacco use-did have colonoscopy (2016-Dr Pyrtle)-this was negative.   Chronic Back Pain with chronic narcotic dependence: follows with pain medicine at [facility] hold narcotics given hypotension-resume when able  Nicotine dependence:will need counseling when more stable.   Further plan will depend as patient's clinical course evolves and further radiologic and laboratory data become available. Patient will be monitored closely.  Above noted plan was discussed with patient/spouse face to face at bedside, they were in agreement.   CONSULTS: IR  DVT Prophylaxis: SCD's  Code Status: Full  Code  Disposition Plan:  Discharge back home vs SNF possibly in 3-4 days, depending on clinical course  Admission status:  Inpatient going to  SDU  The patient is critically ill with multiple organ system failure and requires high complexity decision making for assessment and support, frequent evaluation and titration of therapies, advanced monitoring, review of radiographic studies and interpretation of complex data.   Total critical care time spent  75 minutes.Greater than 50% of this time was spent in counseling, explanation of diagnosis, planning of further management, and coordination of care.  [facility] Pager [phone]  If 7PM-7AM, please contact night-coverage [email or web address] Password [email or web address] 01/18/2017, 5:15 PM

## 2017-01-18 NOTE — Significant Event (Signed)
Rapid Response Event Note  Overview:  Called to assist with hypotensive patient Time Called: 1722 Arrival Time: 1735 Event Type: Hypotension  Initial Focused Assessment:  Patient supine in bed - pale - cool to touch = alert and oriented - complaining of some abd pain - abd flat - slightly firm - tender - biopsy site DDI - NS infusing at 999 cc/hr bolus.  Husband at bedside.  Dr. Laurena Bering at bedside.  RN Melannie reports patient became diaphoretic BP dropped to 60/ and desaturated  - placed on 2 liters.  No LOC.  BP now 115/78 with HR 122 with NS running wide open.     Interventions:  Stat labs with Type and cross match.  Second IV line placed by [facility] RN.  Abd ultrasound report back - Dr. Annamaria Boots to bedside speaking with patient and husband - patient needs IR for embolization.  Consents signed for procedure and blood.  BP 112/76  HR 112 RR 20 O2 sats 98%.  Bil BS remain clear - patient alert.   Second NS bolus hung - patient transported to IR with myself and IR staff.  Handoff to [employer].    Plan of Care (if not transferred):  Event Summary: Name of Physician Notified: Dr. Laurena Bering   Dr. Annamaria Boots at  (PTA RRT)    at    Outcome: Other (Comment) (admitted -was out patient)  Event End Time: 1900  Quin Hoop

## 2017-01-18 NOTE — Procedures (Signed)
Acute perihepatic hemorrhage after left hepatic mass core bx  S/p visceral angios and left hepatic artery gelfoam embo  No comp Stable No active arterial bleeding Full report in PACS

## 2017-01-19 ENCOUNTER — Other Ambulatory Visit (HOSPITAL_COMMUNITY): Payer: Medicare Other

## 2017-01-19 ENCOUNTER — Encounter (HOSPITAL_COMMUNITY): Payer: Self-pay | Admitting: Interventional Radiology

## 2017-01-19 DIAGNOSIS — M519 Unspecified thoracic, thoracolumbar and lumbosacral intervertebral disc disorder: Secondary | ICD-10-CM

## 2017-01-19 DIAGNOSIS — D62 Acute posthemorrhagic anemia: Secondary | ICD-10-CM

## 2017-01-19 DIAGNOSIS — E876 Hypokalemia: Secondary | ICD-10-CM

## 2017-01-19 DIAGNOSIS — C787 Secondary malignant neoplasm of liver and intrahepatic bile duct: Secondary | ICD-10-CM

## 2017-01-19 DIAGNOSIS — G894 Chronic pain syndrome: Secondary | ICD-10-CM

## 2017-01-19 LAB — URINALYSIS, ROUTINE W REFLEX MICROSCOPIC
Bilirubin Urine: NEGATIVE
GLUCOSE, UA: NEGATIVE mg/dL
Hgb urine dipstick: NEGATIVE
KETONES UR: NEGATIVE mg/dL
LEUKOCYTES UA: NEGATIVE
NITRITE: NEGATIVE
PH: 5 (ref 5.0–8.0)
PROTEIN: NEGATIVE mg/dL
Specific Gravity, Urine: >1.046 — ABNORMAL HIGH (ref 1.005–1.030)

## 2017-01-19 LAB — CBC
HCT: 26.7 % — ABNORMAL LOW (ref 36.0–46.0)
HEMATOCRIT: 27.8 % — AB (ref 36.0–46.0)
HEMATOCRIT: 25.6 % — AB (ref 36.0–46.0)
HEMATOCRIT: 29.2 % — AB (ref 36.0–46.0)
HEMOGLOBIN: 7.6 g/dL — AB (ref 12.0–15.0)
HEMOGLOBIN: 8.1 g/dL — AB (ref 12.0–15.0)
HEMOGLOBIN: 8.9 g/dL — AB (ref 12.0–15.0)
Hemoglobin: 8.5 g/dL — ABNORMAL LOW (ref 12.0–15.0)
MCH: 25.5 pg — AB (ref 26.0–34.0)
MCH: 25.6 pg — AB (ref 26.0–34.0)
MCH: 25.2 pg — ABNORMAL LOW (ref 26.0–34.0)
MCH: 25.6 pg — AB (ref 26.0–34.0)
MCHC: 29.7 g/dL — ABNORMAL LOW (ref 30.0–36.0)
MCHC: 30.3 g/dL (ref 30.0–36.0)
MCHC: 30.5 g/dL (ref 30.0–36.0)
MCHC: 30.6 g/dL (ref 30.0–36.0)
MCV: 83.5 fL (ref 78.0–100.0)
MCV: 84.8 fL (ref 78.0–100.0)
MCV: 84.2 fL (ref 78.0–100.0)
MCV: 84.1 fL (ref 78.0–100.0)
PLATELETS: 712 10*3/uL — AB (ref 150–400)
Platelets: 683 10*3/uL — ABNORMAL HIGH (ref 150–400)
Platelets: 774 10*3/uL — ABNORMAL HIGH (ref 150–400)
Platelets: 785 10*3/uL — ABNORMAL HIGH (ref 150–400)
RBC: 3.33 MIL/uL — ABNORMAL LOW (ref 3.87–5.11)
RBC: 3.17 MIL/uL — AB (ref 3.87–5.11)
RBC: 3.02 MIL/uL — ABNORMAL LOW (ref 3.87–5.11)
RBC: 3.47 MIL/uL — AB (ref 3.87–5.11)
RDW: 16.4 % — AB (ref 11.5–15.5)
RDW: 16.4 % — ABNORMAL HIGH (ref 11.5–15.5)
RDW: 16.6 % — AB (ref 11.5–15.5)
RDW: 17.1 % — ABNORMAL HIGH (ref 11.5–15.5)
WBC: 31 10*3/uL — ABNORMAL HIGH (ref 4.0–10.5)
WBC: 33.7 10*3/uL — AB (ref 4.0–10.5)
WBC: 33.7 10*3/uL — AB (ref 4.0–10.5)
WBC: 33.9 10*3/uL — AB (ref 4.0–10.5)

## 2017-01-19 LAB — CBC WITH DIFFERENTIAL/PLATELET
Basophils Absolute: 0 10*3/uL (ref 0.0–0.1)
Basophils Relative: 0 %
EOS ABS: 0 10*3/uL (ref 0.0–0.7)
Eosinophils Relative: 0 %
HEMATOCRIT: 27 % — AB (ref 36.0–46.0)
HEMOGLOBIN: 8.2 g/dL — AB (ref 12.0–15.0)
LYMPHS ABS: 2.3 10*3/uL (ref 0.7–4.0)
LYMPHS PCT: 7 %
MCH: 25.5 pg — ABNORMAL LOW (ref 26.0–34.0)
MCHC: 30.4 g/dL (ref 30.0–36.0)
MCV: 84.1 fL (ref 78.0–100.0)
MONOS PCT: 7 %
Monocytes Absolute: 2.3 10*3/uL — ABNORMAL HIGH (ref 0.1–1.0)
Neutro Abs: 27.6 10*3/uL — ABNORMAL HIGH (ref 1.7–7.7)
Neutrophils Relative %: 86 %
Platelets: 770 10*3/uL — ABNORMAL HIGH (ref 150–400)
RBC: 3.21 MIL/uL — AB (ref 3.87–5.11)
RDW: 16.5 % — ABNORMAL HIGH (ref 11.5–15.5)
WBC: 32.2 10*3/uL — AB (ref 4.0–10.5)

## 2017-01-19 LAB — COMPREHENSIVE METABOLIC PANEL
ALBUMIN: 1.9 g/dL — AB (ref 3.5–5.0)
ALK PHOS: 148 U/L — AB (ref 38–126)
ALT: 15 U/L (ref 14–54)
AST: 50 U/L — AB (ref 15–41)
Anion gap: 13 (ref 5–15)
BILIRUBIN TOTAL: 0.4 mg/dL (ref 0.3–1.2)
BUN: 7 mg/dL (ref 6–20)
CALCIUM: 8.3 mg/dL — AB (ref 8.9–10.3)
CO2: 31 mmol/L (ref 22–32)
CREATININE: 0.7 mg/dL (ref 0.44–1.00)
Chloride: 94 mmol/L — ABNORMAL LOW (ref 101–111)
GFR calc Af Amer: >60 mL/min (ref >60)
GLUCOSE: 144 mg/dL — AB (ref 65–99)
Potassium: 3.5 mmol/L (ref 3.5–5.1)
Sodium: 138 mmol/L (ref 135–145)
TOTAL PROTEIN: 6.7 g/dL (ref 6.5–8.1)

## 2017-01-19 LAB — PROTIME-INR
INR: 1.27
Prothrombin Time: 16 seconds — ABNORMAL HIGH (ref 11.4–15.2)

## 2017-01-19 LAB — LACTIC ACID, PLASMA: Lactic Acid, Venous: 1.8 mmol/L (ref 0.5–1.9)

## 2017-01-19 LAB — APTT: aPTT: 32 seconds (ref 24–36)

## 2017-01-19 LAB — LACTATE DEHYDROGENASE: LDH: 276 U/L — ABNORMAL HIGH (ref 98–192)

## 2017-01-19 LAB — TROPONIN I: Troponin I: 0.03 ng/mL (ref <0.03)

## 2017-01-19 LAB — PROCALCITONIN: Procalcitonin: 0.55 ng/mL

## 2017-01-19 MED ORDER — FENTANYL 25 MCG/HR TD PT72: 25.0000 ug | MEDICATED_PATCH | TRANSDERMAL | Status: DC

## 2017-01-19 MED ORDER — FENTANYL CITRATE (PF) 100 MCG/2ML IJ SOLN
25.0000 ug | INTRAMUSCULAR | Status: DC | PRN
  Administered 2017-01-19 (×6): 25 ug via INTRAVENOUS
  Filled 2017-01-19 (×5): qty 2

## 2017-01-19 MED ORDER — GABAPENTIN 400 MG PO CAPS
400.0000 mg | ORAL_CAPSULE | Freq: Every day | ORAL | Status: DC
  Administered 2017-01-19: 400 mg via ORAL
  Filled 2017-01-19: qty 1

## 2017-01-19 NOTE — Progress Notes (Signed)
Pt H/H as follows 7.6/25.6, pt has NS at 75 ml/hr.  BP 139/69, heart rate 102 sinus tach, oxygen saturation 98% on 2L per [place].  Pt c/o back pain 7.5/10.  NP text paged H/H results.  Will administer prn pain medication per orders and cont to monitor closely.

## 2017-01-19 NOTE — Progress Notes (Signed)
PROGRESS NOTE  Rachel Butler [id number] DOB: 21-Dec-1955 DOA: 01/18/2017 PCP: Viviana Simpler, MD  HPI/Recap of past 24 hours:  Laying in bed, pale, c/o abdominal pain Husband at bedside  Assessment/Plan: Principal Problem:   Sepsis ([place]) Active Problems:   Nicotine dependence   Lumbar disc disease   Chronic pain   Liver lesion  Sepsis presented on admission with hypotension, fever 101, tachycardia, leukocytosis, lactic acidosis She is admitted to stepdown unit.  cxr + copd but no acute findings, blood culture in progress, ua with concentrated urine but no infection, source of infection likely hepatobiliary She is started on vanc/aztrenam ( h/o pcn allergy) ,she received ivf,  Improving  Acute perihepatic hemorrhage after left hepatic mass core biopsy: s/p prbc transfusion, s/p S/p visceral angios and left hepatic artery gelfoam embo Monitor cbc q6hr, transfuse prn IR following  Bleeding in right groin s/p angio Pressure held to right groin times 30 minutes , dressing reinforced Close monitor  Acute blood loss anemia: s/p prbc transfusion, monitor cbc  Leukocytosis/thrombocytosis: likely reactive.  Hypokalemia: replace k, check mag  Multiple Liver Lesions: high suspicion for a malignancy ([place] vs cholangiocarcinoma)-await bx results. Long hx of tobacco use-did have colonoscopy (2016-Dr Pyrtle)-this was negative.  Will check AEP/cea/hepatitis /ldh  Chronic Back Pain with chronic narcotic dependence: follows with pain medicine at [facility] hold narcotics given hypotension-resume when able  COPD: stable, no wheezing, no cough  Nicotine dependence:will need counseling when more stable.    Diet : she is currently npo except ice chips, if she remain stable, plan to start diet around lunch time  Code Status: FULL  Family Communication: patient and husband at bedside  Disposition Plan: remain in stepdown for another 24hrs   Consultants:  IR  Procedures:  left hepatic mass core biopsy on 2/23  isceral angios and left hepatic artery gelfoam embo on 2/23 prbc transfusion on 2/23   Antibiotics:  vanc from admission  aztreonam from admission   Objective: BP (!) 147/81 (BP Location: Right Arm)   Pulse (!) 101   Temp 97.9 F (36.6 C) (Oral)   Resp 13   Ht 5\' 3"  (1.6 m)   Wt 48.2 kg (106 lb 4.8 oz)   SpO2 100%   BMI 18.83 kg/m   Intake/Output Summary (Last 24 hours) at 01/19/17 0817 Last data filed at 01/19/17 0636  Gross per 24 hour  Intake          1346.25 ml  Output              400 ml  Net           946.25 ml   Filed Weights   01/18/17 1133 01/18/17 2043 01/19/17 0500  Weight: 44 kg (97 lb) 47.6 kg (104 lb 15 oz) 48.2 kg (106 lb 4.8 oz)    Exam:   General:  Pale, weak, not in acute distress  Cardiovascular: tachycardia has improved  Respiratory: CTABL  Abdomen: tender right upper quadrant, mild guarding, no rebound,positive BS  Musculoskeletal: No Edema  Neuro: aaox3, flat affect   Data Reviewed: Basic Metabolic Panel:  Recent Labs Lab 01/18/17 1614 01/19/17 0639  NA 137 138  K 2.7* 3.5  CL 87* 94*  CO2 34* 31  GLUCOSE 146* 144*  BUN 5* 7  CREATININE 0.86 0.70  CALCIUM 8.7* 8.3*   Liver Function Tests:  Recent Labs Lab 01/18/17 1614 01/19/17 0639  AST 31 50*  ALT 12* 15  ALKPHOS 186* 148*  BILITOT  0.7 0.4  PROT 7.3 6.7  ALBUMIN 2.0* 1.9*   No results for input(s): LIPASE, AMYLASE in the last 168 hours. No results for input(s): AMMONIA in the last 168 hours. CBC:  Recent Labs Lab 01-31-2017 1134 2017-01-31 1558 January 31, 2017 2351 01/19/17 0231 01/19/17 0631  WBC 20.7* 26.3* 31.0* 33.7* 32.2*  NEUTROABS  --   --   --   --  PENDING  HGB 10.6* 9.0* 8.9* 8.5* 8.2*  HCT 35.2* 29.7* 29.2* 27.8* 27.0*  MCV 81.5 80.9 84.1 83.5 84.1  PLT 886* 1,047* 785* 774* 770*   Cardiac Enzymes:    Recent Labs Lab 01/31/2017 1558 01/19/17 0639  TROPONINI 0.03* 0.03*   BNP (last 3 results) No  results for input(s): BNP in the last 8760 hours.  ProBNP (last 3 results) No results for input(s): PROBNP in the last 8760 hours.  CBG: No results for input(s): GLUCAP in the last 168 hours.  Recent Results (from the past 240 hour(s))  MRSA PCR Screening     Status: None   Collection Time: 31-Jan-2017  8:50 PM  Result Value Ref Range Status   MRSA by PCR NEGATIVE NEGATIVE Final    Comment:        The GeneXpert MRSA Assay (FDA approved for NASAL specimens only), is one component of a comprehensive MRSA colonization surveillance program. It is not intended to diagnose MRSA infection nor to guide or monitor treatment for MRSA infections.      Studies: US Biopsy  Result Date: 2017/01/31 INDICATION: LIVER METASTASES, UNKNOWN PRIMARY. EXAM: ULTRASOUND BIOPSY CORE LIVER MEDICATIONS: 1% LIDOCAINE ANESTHESIA/SEDATION: Moderate (conscious) sedation was employed during this procedure. A total of Versed 1.0 mg and Fentanyl 50 mcg was administered intravenously. Moderate Sedation Time: 10 minutes. The patient's level of consciousness and vital signs were monitored continuously by radiology nursing throughout the procedure under my direct supervision. FLUOROSCOPY TIME:  Fluoroscopy Time: NONE. COMPLICATIONS: None immediate. PROCEDURE: Informed written consent was obtained from the patient after a thorough discussion of the procedural risks, benefits and alternatives. All questions were addressed. Maximal Sterile Barrier Technique was utilized including caps, mask, sterile gowns, sterile gloves, sterile drape, hand hygiene and skin antiseptic. A timeout was performed prior to the initiation of the procedure. Previous imaging reviewed. Preliminary ultrasound performed. A solid left hepatic lobe lesion was localized from a subxiphoid window. This was correlated with the abdominal MRI. Overlying skin was marked. Under sterile conditions and local anesthesia, a 17 gauge 6.8 cm access needle was advanced from  an anterior subxiphoid approach into the left hepatic mass. Needle position confirmed with ultrasound. Three 1 cm 18 gauge core biopsies obtained. Samples placed in formalin. These were intact and non fragmented. Needle tract embolized with Gel-Foam. Postprocedure imaging demonstrates no hemorrhage or hematoma. Patient tolerated the biopsy well. IMPRESSION: Successful ultrasound left hepatic mass 18 gauge core biopsy Electronically Signed   By: Jerilynn Mages.  Shick M.D.   On: 31-Jan-2017 14:53   Dg Chest Port 1v Same Day  Result Date: 01-31-2017 CLINICAL DATA:  Acute onset of shortness of breath, vomiting and epigastric abdominal pain. Fever. Initial encounter. EXAM: PORTABLE CHEST 1 VIEW COMPARISON:  Chest radiograph performed 01/16/2017 FINDINGS: The lungs are hyperexpanded, with flattening of the hemidiaphragms, compatible with COPD. There is no evidence of focal opacification, pleural effusion or pneumothorax. The cardiomediastinal silhouette is within normal limits. No acute osseous abnormalities are seen. IMPRESSION: Findings of COPD.  Lungs remain otherwise clear. Electronically Signed   By: Francoise Schaumann.D.  On: 01/18/2017 23:48   Dg Abd Portable 1v  Result Date: 01/18/2017 CLINICAL DATA:  Acute onset of shortness of breath, vomiting and epigastric abdominal pain. Recent liver biopsy. Fever. Initial encounter. EXAM: PORTABLE ABDOMEN - 1 VIEW COMPARISON:  None. FINDINGS: The visualized bowel gas pattern is unremarkable. Scattered air and stool filled loops of colon are seen; no abnormal dilatation of small bowel loops is seen to suggest small bowel obstruction. No free intra-abdominal air is identified, though evaluation for free air is limited on a single supine view. The stomach is largely filled with air. The visualized osseous structures are within normal limits; the sacroiliac joints are unremarkable in appearance. IMPRESSION: Unremarkable bowel gas pattern; no free intra-abdominal air seen. Moderate  amount of stool noted in the colon. Electronically Signed   By: Garald Balding M.D.   On: 01/18/2017 23:50   US Abdomen Limited Ruq  Result Date: 01/18/2017 CLINICAL DATA:  61 year old female with a history of multiple liver masses, status post percutaneous biopsy today's date. Interval development of abdominal pain EXAM: US ABDOMEN LIMITED - RIGHT UPPER QUADRANT COMPARISON:  Ultrasound 01/18/2017, MRI 01/15/2017 FINDINGS: Gallbladder: Contracted gallbladder with echogenic focus in the lumen compatible with cholelithiasis. Common bile duct: Diameter: 4 mm -5 mm Liver: Re- demonstration of mass within right liver lobe 7 point far cm by 5.4 cm x 7.4 cm. The biopsy of the left liver lobe on the comparison ultrasound today's date demonstrates small echogenic focus, compatible with hemostatic material. There is hyperechoic free fluid adjacent to the liver margin, both anterior and posterior to the left liver lobe. IMPRESSION: New hyperechoic free fluid adjacent to the left liver lobe, status post percutaneous biopsy, compatible with hemorrhage. Re- demonstration of liver masses, better characterized on prior MRI. These results were called by telephone at the time of interpretation on 01/18/2017 at 5:42 pm to Dr. Annamaria Boots who verbally acknowledged these results. Electronically Signed   By: Corrie Mckusick D.O.   On: 01/18/2017 17:43    Scheduled Meds: . sodium chloride   Intravenous Once  . acetaminophen  650 mg Oral Once  . aztreonam  1 g Intravenous Q8H  . chloroprocaine  30 mL Infiltration to XRAY  . diphenhydrAMINE  25 mg Intravenous Once  . senna-docusate  2 tablet Oral QHS  . sodium chloride flush  3 mL Intravenous Q12H  . sodium chloride flush  3 mL Intravenous Q12H  . vancomycin  1,000 mg Intravenous Q24H    Continuous Infusions: . sodium chloride 75 mL/hr at 01/18/17 2147     Time spent: 26mins  Cheyann Blecha MD, PhD  [facility] Pager [phone]. If 7PM-7AM, please contact night-coverage at  [email or web address], password [facility] 01/19/2017, 8:17 AM  LOS: 1 day

## 2017-01-19 NOTE — Progress Notes (Signed)
Pt c/o 7/10 same acute abdominal pain as earlier. RN spoke with Dr. Hal Hope and per MD order to give another dose of fentanyl 9mcg now. Will continue to monitor.

## 2017-01-19 NOTE — Progress Notes (Signed)
Referring Physician(s): Letvak,Richard I  Supervising Physician: Daryll Brod  Patient Status:  [facility] - In-pt  Chief Complaint:  Liver mass S/P biopsy with subsequent bleeding then S/P embolization by Dr.  Annamaria Boots yesterday  Subjective:  Ms Zerangue states she doesn't feel well and is c/o RUQ pain 7/10.   Allergies: Endoxcin [lidocaine-menthol]; Nubain [nalbuphine hcl]; Penicillins; and Robaxin [methocarbamol]  Medications: Prior to Admission medications   Medication Sig Start Date End Date Taking? Authorizing Provider  Aspirin-Salicylamide-Caffeine (BC HEADACHE POWDER PO) Take 1 Package by mouth 2 (two) times daily. Pain   Yes Historical Provider, MD  BIOTIN 5000 PO Take by mouth daily.   Yes Historical Provider, MD  fentaNYL (DURAGESIC - DOSED MCG/HR) 25 MCG/HR patch Place 25 mcg onto the skin every 3 (three) days.  12/13/14  Yes Historical Provider, MD  furosemide (LASIX) 40 MG tablet TAKE 1 TABLET BY MOUTH ONCE A DAY 12/20/16  Yes Venia Carbon, MD  gabapentin (NEURONTIN) 800 MG tablet Take 1 tablet (800 mg total) by mouth 3 (three) times daily. 01/09/17  Yes Venia Carbon, MD  HYDROcodone-acetaminophen (NORCO/VICODIN) 5-325 MG per tablet Take 1 tablet by mouth 3 (three) times daily as needed for pain.   Yes Historical Provider, MD  LORazepam (ATIVAN) 0.5 MG tablet 1-2 tablets 1 hour before MRI. May repeat as needed. 01/14/17  Yes Venia Carbon, MD  Multiple Vitamins-Minerals (MULTIVITAMIN WITH MINERALS) tablet Take 1 tablet by mouth daily.   Yes Historical Provider, MD  promethazine (PHENERGAN) 25 MG tablet TAKE 0.5-1 TABLETS (12.5-25 MG TOTAL) BY MOUTH 3 (THREE) TIMES DAILY AS NEEDED FOR NAUSEA. 06/28/16  Yes Venia Carbon, MD  sennosides-docusate sodium (SENOKOT-S) 8.6-50 MG tablet Take 2 tablets by mouth daily.   Yes Historical Provider, MD     Vital Signs: BP 121/63 (BP Location: Right Arm)   Pulse 88   Temp 98.9 F (37.2 C) (Oral)   Resp 15   Ht 5\' 3"  (1.6  m)   Wt 106 lb 4.8 oz (48.2 kg)   SpO2 95%   BMI 18.83 kg/m   Physical Exam Awake and alert NAD Lying in bed RUQ tender with firmness c/w mass Right groin dressing removed, no bleeding seen, no hematoma, no pseudoaneurysm  Imaging: Dg Chest 2 View  Result Date: 01/16/2017 CLINICAL DATA:  Liver metastases.  Tobacco use. EXAM: CHEST  2 VIEW COMPARISON:  August 25, 2015 FINDINGS: There are nipple shadows bilaterally. There is no evident edema or consolidation. No pulmonary nodular lesion is demonstrable by radiography. Heart size and pulmonary vascularity are normal. No adenopathy. There is atherosclerotic calcification in the aorta. No blastic or lytic bone lesions. IMPRESSION: Aortic atherosclerosis. No edema or consolidation. No evident mass or adenopathy. Electronically Signed   By: Lowella Grip III M.D.   On: 01/16/2017 14:34   Mr Abdomen Wwo Contrast  Result Date: 01/15/2017 CLINICAL DATA:  Evaluate liver lesions noted on ultrasound. EXAM: MRI ABDOMEN WITHOUT AND WITH CONTRAST TECHNIQUE: Multiplanar multisequence MR imaging of the abdomen was performed both before and after the administration of intravenous contrast. CONTRAST:  23mL MULTIHANCE GADOBENATE DIMEGLUMINE 529 MG/ML IV SOLN COMPARISON:  None. FINDINGS: Lower chest: No acute findings. Hepatobiliary: The liver is enlarged containing multiple ill defined heterogeneous and peripheral enhancing lesions worrisome for multifocal metastatic disease. The largest lesion is in the medial segment of left lobe measuring 6 cm, image number 26 of series 6. Lesion within segment 8 measures 3.2 cm, image number 8 of  series 6. Segment 5 lesion measures 3.1 cm, image 31 of series 6. The gallbladder fundus extends into the large mass within the medial segment of left lobe, image number 65 of series 1401. No biliary dilatation. Pancreas:  No inflammation or mass. Spleen:  Within normal limits in size and appearance. Adrenals/Urinary Tract: No  masses identified. No evidence of hydronephrosis. Stomach/Bowel: The stomach is normal. The small bowel loops have a normal course and caliber. Visualized portions of the colon are unremarkable. Vascular/Lymphatic: Normal appearance of the abdominal aorta. Aortic atherosclerosis noted. Periaortic node is prominent measuring 8 mm, image number 50 of series 1401. Necrotic appearing portacaval node measures 1.5 cm, image 55 of series 1405. Porta hepatis node measures 1.3 cm, image 49 of series 1404 and has mass effect upon the extrahepatic portal vein. Other:  None. Musculoskeletal: No suspicious bone lesions identified. IMPRESSION: 1. Examination is positive for multifocal heterogeneous enhancing liver lesions worrisome for diffuse hepatic metastasis. Correlation with tissue sampling is advised. The dominant mass appears contiguous with the gallbladder fundus. Cannot rule out gallbladder carcinoma or other GI primary. 2. Enlarged portacaval and porta hepatic lymph nodes worrisome for metastatic adenopathy. Electronically Signed   By: Kerby Moors M.D.   On: 01/15/2017 16:09   Ir Angiogram Visceral Selective  Result Date: 01/19/2017 INDICATION: Acute abdominal pain with acute perihepatic hemorrhage 2 hours status post percutaneous left hepatic metastasis biopsy. EXAM: SELECTIVE VISCERAL ARTERIOGRAPHY; IR EMBO ART VEN HEMORR LYMPH EXTRAV INC GUIDE ROADMAPPING; ADDITIONAL ARTERIOGRAPHY; IR ULTRASOUND GUIDANCE VASC ACCESS RIGHT MEDICATIONS: 1% lidocaine locally. ANESTHESIA/SEDATION: Moderate (conscious) sedation was employed during this procedure. A total of Versed 1.0 mg and Fentanyl 25 mcg was administered intravenously. Moderate Sedation Time: 50 minutes. The patient's level of consciousness and vital signs were monitored continuously by radiology nursing throughout the procedure under my direct supervision. CONTRAST:  1 ISOVUE-300 IOPAMIDOL (ISOVUE-300) INJECTION 61%, 1 ISOVUE-300 IOPAMIDOL (ISOVUE-300)  INJECTION 61% FLUOROSCOPY TIME:  Fluoroscopy Time: 14 minutes 12 seconds (144 mGy). COMPLICATIONS: None immediate. PROCEDURE: Informed consent was obtained from the patient following explanation of the procedure, risks, benefits and alternatives. The patient understands, agrees and consents for the procedure. All questions were addressed. A time out was performed prior to the initiation of the procedure. Maximal barrier sterile technique utilized including caps, mask, sterile gowns, sterile gloves, large sterile drape, hand hygiene, and Betadine prep. Under sterile conditions and local anesthesia, ultrasound micropuncture access performed of the right common femoral artery. Five French sheath inserted. SOS catheter was utilized to select the celiac origin. Celiac angiogram performed. Celiac: Celiac origin is widely patent. Splenic, common hepatic, gastroduodenal artery, proper hepatic, right and left hepatic arteries are all patent. Irregularity of the hepatic vasculature is secondary to hepatomegaly and diffuse hepatic metastases. No active bleeding demonstrated. On the venous phase, the splenic vein and portal vein are all patent. Through the [place] a 5 French catheter, a Renegade STC microcatheter was advanced over a micro glidewire into the proper hepatic artery. Proper hepatic angiogram: Proper hepatic, right and left hepatic arteries are patent. Vasospasm present in the proximal right hepatic artery related to wire manipulation. Limited assessment of the vasculature because of the contrast injection and poor opacification of the left hepatic artery. Over a double angled Glidewire, the microcatheter was advanced into the left hepatic artery. Left hepatic angiogram performed. Left hepatic: Left hepatic artery is patent. This is the dominant supply to the left hepatic lobe lateral segment were the percutaneous biopsy was performed. Peripheral irregularity of the  arterial vasculature related to hepatic metastases. No  active extravasation, acute bleeding, or other acute vascular abnormality including AV fistula or pseudoaneurysm. Because of the significant recent acute perihepatic hemorrhage and a biopsy performed in the left hepatic lobe lateral segment, Gel-Foam embolization of left hepatic artery will be performed. Left hepatic artery Gel-Foam embolization: Through the microcatheter, approximately 5 cc of Gel-Foam slurry was instilled into left hepatic artery. Following embolization, there is significant reduction in the left hepatic arterial peripheral vasculature and sluggish flow but the left hepatic main artery remains patent. Catheter was retracted and advanced into a left hepatic branch supplying the medial segment. Additional left hepatic angiogram performed. Additional left hepatic angiogram: Irregularity of the parenchymal staining related to diffuse hepatic metastases. Scattered peripheral occlusions present in the additional left hepatic branch related to the Gel-Foam embolization. Vasospasm noted at the catheter site. No active bleeding demonstrated. Microcatheter was removed. A final angiogram was performed through the SOS 5 French catheter of the celiac origin. Celiac: Following left hepatic Gel-Foam embolization, there is significant reduction in the arterial vascularity to the the left hepatic lobe where the biopsy was performed. Residual vasospasm present in the proximal left hepatic artery. No other significant acute vascular process. Catheter was retracted and advanced into the SMA. SMA angiogram performed. SMA angiogram: SMA is widely patent. No variant SMA supply demonstrated to the liver. On the venous phase, the superior mesenteric vein and portal vein all remain patent. Access removed. Hemostasis obtained with manual compression of the right common femoral artery puncture site. Patient tolerated the procedure well. No immediate complication. IMPRESSION: Successful celiac, hepatic, and SMA angiograms as  above. Successful micro catheterization, angiograms, and Gel-Foam embolization of the left hepatic artery resulting in significant reduction in arterial flow to the area the liver were the percutaneous biopsy was performed. Electronically Signed   By: Jerilynn Mages.  Shick M.D.   On: 01/19/2017 10:01   Ir Angiogram Visceral Selective  Result Date: 01/19/2017 INDICATION: Acute abdominal pain with acute perihepatic hemorrhage 2 hours status post percutaneous left hepatic metastasis biopsy. EXAM: SELECTIVE VISCERAL ARTERIOGRAPHY; IR EMBO ART VEN HEMORR LYMPH EXTRAV INC GUIDE ROADMAPPING; ADDITIONAL ARTERIOGRAPHY; IR ULTRASOUND GUIDANCE VASC ACCESS RIGHT MEDICATIONS: 1% lidocaine locally. ANESTHESIA/SEDATION: Moderate (conscious) sedation was employed during this procedure. A total of Versed 1.0 mg and Fentanyl 25 mcg was administered intravenously. Moderate Sedation Time: 50 minutes. The patient's level of consciousness and vital signs were monitored continuously by radiology nursing throughout the procedure under my direct supervision. CONTRAST:  1 ISOVUE-300 IOPAMIDOL (ISOVUE-300) INJECTION 61%, 1 ISOVUE-300 IOPAMIDOL (ISOVUE-300) INJECTION 61% FLUOROSCOPY TIME:  Fluoroscopy Time: 14 minutes 12 seconds (144 mGy). COMPLICATIONS: None immediate. PROCEDURE: Informed consent was obtained from the patient following explanation of the procedure, risks, benefits and alternatives. The patient understands, agrees and consents for the procedure. All questions were addressed. A time out was performed prior to the initiation of the procedure. Maximal barrier sterile technique utilized including caps, mask, sterile gowns, sterile gloves, large sterile drape, hand hygiene, and Betadine prep. Under sterile conditions and local anesthesia, ultrasound micropuncture access performed of the right common femoral artery. Five French sheath inserted. SOS catheter was utilized to select the celiac origin. Celiac angiogram performed. Celiac: Celiac  origin is widely patent. Splenic, common hepatic, gastroduodenal artery, proper hepatic, right and left hepatic arteries are all patent. Irregularity of the hepatic vasculature is secondary to hepatomegaly and diffuse hepatic metastases. No active bleeding demonstrated. On the venous phase, the splenic vein and portal vein are all  patent. Through the [place] a 5 French catheter, a Renegade STC microcatheter was advanced over a micro glidewire into the proper hepatic artery. Proper hepatic angiogram: Proper hepatic, right and left hepatic arteries are patent. Vasospasm present in the proximal right hepatic artery related to wire manipulation. Limited assessment of the vasculature because of the contrast injection and poor opacification of the left hepatic artery. Over a double angled Glidewire, the microcatheter was advanced into the left hepatic artery. Left hepatic angiogram performed. Left hepatic: Left hepatic artery is patent. This is the dominant supply to the left hepatic lobe lateral segment were the percutaneous biopsy was performed. Peripheral irregularity of the arterial vasculature related to hepatic metastases. No active extravasation, acute bleeding, or other acute vascular abnormality including AV fistula or pseudoaneurysm. Because of the significant recent acute perihepatic hemorrhage and a biopsy performed in the left hepatic lobe lateral segment, Gel-Foam embolization of left hepatic artery will be performed. Left hepatic artery Gel-Foam embolization: Through the microcatheter, approximately 5 cc of Gel-Foam slurry was instilled into left hepatic artery. Following embolization, there is significant reduction in the left hepatic arterial peripheral vasculature and sluggish flow but the left hepatic main artery remains patent. Catheter was retracted and advanced into a left hepatic branch supplying the medial segment. Additional left hepatic angiogram performed. Additional left hepatic angiogram:  Irregularity of the parenchymal staining related to diffuse hepatic metastases. Scattered peripheral occlusions present in the additional left hepatic branch related to the Gel-Foam embolization. Vasospasm noted at the catheter site. No active bleeding demonstrated. Microcatheter was removed. A final angiogram was performed through the SOS 5 French catheter of the celiac origin. Celiac: Following left hepatic Gel-Foam embolization, there is significant reduction in the arterial vascularity to the the left hepatic lobe where the biopsy was performed. Residual vasospasm present in the proximal left hepatic artery. No other significant acute vascular process. Catheter was retracted and advanced into the SMA. SMA angiogram performed. SMA angiogram: SMA is widely patent. No variant SMA supply demonstrated to the liver. On the venous phase, the superior mesenteric vein and portal vein all remain patent. Access removed. Hemostasis obtained with manual compression of the right common femoral artery puncture site. Patient tolerated the procedure well. No immediate complication. IMPRESSION: Successful celiac, hepatic, and SMA angiograms as above. Successful micro catheterization, angiograms, and Gel-Foam embolization of the left hepatic artery resulting in significant reduction in arterial flow to the area the liver were the percutaneous biopsy was performed. Electronically Signed   By: Jerilynn Mages.  Shick M.D.   On: 01/19/2017 10:01   Ir Angiogram Selective Each Additional Vessel  Result Date: 01/19/2017 INDICATION: Acute abdominal pain with acute perihepatic hemorrhage 2 hours status post percutaneous left hepatic metastasis biopsy. EXAM: SELECTIVE VISCERAL ARTERIOGRAPHY; IR EMBO ART VEN HEMORR LYMPH EXTRAV INC GUIDE ROADMAPPING; ADDITIONAL ARTERIOGRAPHY; IR ULTRASOUND GUIDANCE VASC ACCESS RIGHT MEDICATIONS: 1% lidocaine locally. ANESTHESIA/SEDATION: Moderate (conscious) sedation was employed during this procedure. A total of  Versed 1.0 mg and Fentanyl 25 mcg was administered intravenously. Moderate Sedation Time: 50 minutes. The patient's level of consciousness and vital signs were monitored continuously by radiology nursing throughout the procedure under my direct supervision. CONTRAST:  1 ISOVUE-300 IOPAMIDOL (ISOVUE-300) INJECTION 61%, 1 ISOVUE-300 IOPAMIDOL (ISOVUE-300) INJECTION 61% FLUOROSCOPY TIME:  Fluoroscopy Time: 14 minutes 12 seconds (144 mGy). COMPLICATIONS: None immediate. PROCEDURE: Informed consent was obtained from the patient following explanation of the procedure, risks, benefits and alternatives. The patient understands, agrees and consents for the procedure. All questions were addressed. A  time out was performed prior to the initiation of the procedure. Maximal barrier sterile technique utilized including caps, mask, sterile gowns, sterile gloves, large sterile drape, hand hygiene, and Betadine prep. Under sterile conditions and local anesthesia, ultrasound micropuncture access performed of the right common femoral artery. Five French sheath inserted. SOS catheter was utilized to select the celiac origin. Celiac angiogram performed. Celiac: Celiac origin is widely patent. Splenic, common hepatic, gastroduodenal artery, proper hepatic, right and left hepatic arteries are all patent. Irregularity of the hepatic vasculature is secondary to hepatomegaly and diffuse hepatic metastases. No active bleeding demonstrated. On the venous phase, the splenic vein and portal vein are all patent. Through the [place] a 5 French catheter, a Renegade STC microcatheter was advanced over a micro glidewire into the proper hepatic artery. Proper hepatic angiogram: Proper hepatic, right and left hepatic arteries are patent. Vasospasm present in the proximal right hepatic artery related to wire manipulation. Limited assessment of the vasculature because of the contrast injection and poor opacification of the left hepatic artery. Over a double  angled Glidewire, the microcatheter was advanced into the left hepatic artery. Left hepatic angiogram performed. Left hepatic: Left hepatic artery is patent. This is the dominant supply to the left hepatic lobe lateral segment were the percutaneous biopsy was performed. Peripheral irregularity of the arterial vasculature related to hepatic metastases. No active extravasation, acute bleeding, or other acute vascular abnormality including AV fistula or pseudoaneurysm. Because of the significant recent acute perihepatic hemorrhage and a biopsy performed in the left hepatic lobe lateral segment, Gel-Foam embolization of left hepatic artery will be performed. Left hepatic artery Gel-Foam embolization: Through the microcatheter, approximately 5 cc of Gel-Foam slurry was instilled into left hepatic artery. Following embolization, there is significant reduction in the left hepatic arterial peripheral vasculature and sluggish flow but the left hepatic main artery remains patent. Catheter was retracted and advanced into a left hepatic branch supplying the medial segment. Additional left hepatic angiogram performed. Additional left hepatic angiogram: Irregularity of the parenchymal staining related to diffuse hepatic metastases. Scattered peripheral occlusions present in the additional left hepatic branch related to the Gel-Foam embolization. Vasospasm noted at the catheter site. No active bleeding demonstrated. Microcatheter was removed. A final angiogram was performed through the SOS 5 French catheter of the celiac origin. Celiac: Following left hepatic Gel-Foam embolization, there is significant reduction in the arterial vascularity to the the left hepatic lobe where the biopsy was performed. Residual vasospasm present in the proximal left hepatic artery. No other significant acute vascular process. Catheter was retracted and advanced into the SMA. SMA angiogram performed. SMA angiogram: SMA is widely patent. No variant SMA  supply demonstrated to the liver. On the venous phase, the superior mesenteric vein and portal vein all remain patent. Access removed. Hemostasis obtained with manual compression of the right common femoral artery puncture site. Patient tolerated the procedure well. No immediate complication. IMPRESSION: Successful celiac, hepatic, and SMA angiograms as above. Successful micro catheterization, angiograms, and Gel-Foam embolization of the left hepatic artery resulting in significant reduction in arterial flow to the area the liver were the percutaneous biopsy was performed. Electronically Signed   By: Jerilynn Mages.  Shick M.D.   On: 01/19/2017 10:01   Ir Angiogram Selective Each Additional Vessel  Result Date: 01/19/2017 INDICATION: Acute abdominal pain with acute perihepatic hemorrhage 2 hours status post percutaneous left hepatic metastasis biopsy. EXAM: SELECTIVE VISCERAL ARTERIOGRAPHY; IR EMBO ART VEN HEMORR LYMPH EXTRAV INC GUIDE ROADMAPPING; ADDITIONAL ARTERIOGRAPHY; IR ULTRASOUND GUIDANCE VASC  ACCESS RIGHT MEDICATIONS: 1% lidocaine locally. ANESTHESIA/SEDATION: Moderate (conscious) sedation was employed during this procedure. A total of Versed 1.0 mg and Fentanyl 25 mcg was administered intravenously. Moderate Sedation Time: 50 minutes. The patient's level of consciousness and vital signs were monitored continuously by radiology nursing throughout the procedure under my direct supervision. CONTRAST:  1 ISOVUE-300 IOPAMIDOL (ISOVUE-300) INJECTION 61%, 1 ISOVUE-300 IOPAMIDOL (ISOVUE-300) INJECTION 61% FLUOROSCOPY TIME:  Fluoroscopy Time: 14 minutes 12 seconds (144 mGy). COMPLICATIONS: None immediate. PROCEDURE: Informed consent was obtained from the patient following explanation of the procedure, risks, benefits and alternatives. The patient understands, agrees and consents for the procedure. All questions were addressed. A time out was performed prior to the initiation of the procedure. Maximal barrier sterile  technique utilized including caps, mask, sterile gowns, sterile gloves, large sterile drape, hand hygiene, and Betadine prep. Under sterile conditions and local anesthesia, ultrasound micropuncture access performed of the right common femoral artery. Five French sheath inserted. SOS catheter was utilized to select the celiac origin. Celiac angiogram performed. Celiac: Celiac origin is widely patent. Splenic, common hepatic, gastroduodenal artery, proper hepatic, right and left hepatic arteries are all patent. Irregularity of the hepatic vasculature is secondary to hepatomegaly and diffuse hepatic metastases. No active bleeding demonstrated. On the venous phase, the splenic vein and portal vein are all patent. Through the [place] a 5 French catheter, a Renegade STC microcatheter was advanced over a micro glidewire into the proper hepatic artery. Proper hepatic angiogram: Proper hepatic, right and left hepatic arteries are patent. Vasospasm present in the proximal right hepatic artery related to wire manipulation. Limited assessment of the vasculature because of the contrast injection and poor opacification of the left hepatic artery. Over a double angled Glidewire, the microcatheter was advanced into the left hepatic artery. Left hepatic angiogram performed. Left hepatic: Left hepatic artery is patent. This is the dominant supply to the left hepatic lobe lateral segment were the percutaneous biopsy was performed. Peripheral irregularity of the arterial vasculature related to hepatic metastases. No active extravasation, acute bleeding, or other acute vascular abnormality including AV fistula or pseudoaneurysm. Because of the significant recent acute perihepatic hemorrhage and a biopsy performed in the left hepatic lobe lateral segment, Gel-Foam embolization of left hepatic artery will be performed. Left hepatic artery Gel-Foam embolization: Through the microcatheter, approximately 5 cc of Gel-Foam slurry was instilled  into left hepatic artery. Following embolization, there is significant reduction in the left hepatic arterial peripheral vasculature and sluggish flow but the left hepatic main artery remains patent. Catheter was retracted and advanced into a left hepatic branch supplying the medial segment. Additional left hepatic angiogram performed. Additional left hepatic angiogram: Irregularity of the parenchymal staining related to diffuse hepatic metastases. Scattered peripheral occlusions present in the additional left hepatic branch related to the Gel-Foam embolization. Vasospasm noted at the catheter site. No active bleeding demonstrated. Microcatheter was removed. A final angiogram was performed through the SOS 5 French catheter of the celiac origin. Celiac: Following left hepatic Gel-Foam embolization, there is significant reduction in the arterial vascularity to the the left hepatic lobe where the biopsy was performed. Residual vasospasm present in the proximal left hepatic artery. No other significant acute vascular process. Catheter was retracted and advanced into the SMA. SMA angiogram performed. SMA angiogram: SMA is widely patent. No variant SMA supply demonstrated to the liver. On the venous phase, the superior mesenteric vein and portal vein all remain patent. Access removed. Hemostasis obtained with manual compression of the right common femoral  artery puncture site. Patient tolerated the procedure well. No immediate complication. IMPRESSION: Successful celiac, hepatic, and SMA angiograms as above. Successful micro catheterization, angiograms, and Gel-Foam embolization of the left hepatic artery resulting in significant reduction in arterial flow to the area the liver were the percutaneous biopsy was performed. Electronically Signed   By: Jerilynn Mages.  Shick M.D.   On: 01/19/2017 10:01   Ir Angiogram Selective Each Additional Vessel  Result Date: 01/19/2017 INDICATION: Acute abdominal pain with acute perihepatic  hemorrhage 2 hours status post percutaneous left hepatic metastasis biopsy. EXAM: SELECTIVE VISCERAL ARTERIOGRAPHY; IR EMBO ART VEN HEMORR LYMPH EXTRAV INC GUIDE ROADMAPPING; ADDITIONAL ARTERIOGRAPHY; IR ULTRASOUND GUIDANCE VASC ACCESS RIGHT MEDICATIONS: 1% lidocaine locally. ANESTHESIA/SEDATION: Moderate (conscious) sedation was employed during this procedure. A total of Versed 1.0 mg and Fentanyl 25 mcg was administered intravenously. Moderate Sedation Time: 50 minutes. The patient's level of consciousness and vital signs were monitored continuously by radiology nursing throughout the procedure under my direct supervision. CONTRAST:  1 ISOVUE-300 IOPAMIDOL (ISOVUE-300) INJECTION 61%, 1 ISOVUE-300 IOPAMIDOL (ISOVUE-300) INJECTION 61% FLUOROSCOPY TIME:  Fluoroscopy Time: 14 minutes 12 seconds (144 mGy). COMPLICATIONS: None immediate. PROCEDURE: Informed consent was obtained from the patient following explanation of the procedure, risks, benefits and alternatives. The patient understands, agrees and consents for the procedure. All questions were addressed. A time out was performed prior to the initiation of the procedure. Maximal barrier sterile technique utilized including caps, mask, sterile gowns, sterile gloves, large sterile drape, hand hygiene, and Betadine prep. Under sterile conditions and local anesthesia, ultrasound micropuncture access performed of the right common femoral artery. Five French sheath inserted. SOS catheter was utilized to select the celiac origin. Celiac angiogram performed. Celiac: Celiac origin is widely patent. Splenic, common hepatic, gastroduodenal artery, proper hepatic, right and left hepatic arteries are all patent. Irregularity of the hepatic vasculature is secondary to hepatomegaly and diffuse hepatic metastases. No active bleeding demonstrated. On the venous phase, the splenic vein and portal vein are all patent. Through the [place] a 5 French catheter, a Renegade STC microcatheter  was advanced over a micro glidewire into the proper hepatic artery. Proper hepatic angiogram: Proper hepatic, right and left hepatic arteries are patent. Vasospasm present in the proximal right hepatic artery related to wire manipulation. Limited assessment of the vasculature because of the contrast injection and poor opacification of the left hepatic artery. Over a double angled Glidewire, the microcatheter was advanced into the left hepatic artery. Left hepatic angiogram performed. Left hepatic: Left hepatic artery is patent. This is the dominant supply to the left hepatic lobe lateral segment were the percutaneous biopsy was performed. Peripheral irregularity of the arterial vasculature related to hepatic metastases. No active extravasation, acute bleeding, or other acute vascular abnormality including AV fistula or pseudoaneurysm. Because of the significant recent acute perihepatic hemorrhage and a biopsy performed in the left hepatic lobe lateral segment, Gel-Foam embolization of left hepatic artery will be performed. Left hepatic artery Gel-Foam embolization: Through the microcatheter, approximately 5 cc of Gel-Foam slurry was instilled into left hepatic artery. Following embolization, there is significant reduction in the left hepatic arterial peripheral vasculature and sluggish flow but the left hepatic main artery remains patent. Catheter was retracted and advanced into a left hepatic branch supplying the medial segment. Additional left hepatic angiogram performed. Additional left hepatic angiogram: Irregularity of the parenchymal staining related to diffuse hepatic metastases. Scattered peripheral occlusions present in the additional left hepatic branch related to the Gel-Foam embolization. Vasospasm noted at the catheter site.  No active bleeding demonstrated. Microcatheter was removed. A final angiogram was performed through the SOS 5 French catheter of the celiac origin. Celiac: Following left hepatic  Gel-Foam embolization, there is significant reduction in the arterial vascularity to the the left hepatic lobe where the biopsy was performed. Residual vasospasm present in the proximal left hepatic artery. No other significant acute vascular process. Catheter was retracted and advanced into the SMA. SMA angiogram performed. SMA angiogram: SMA is widely patent. No variant SMA supply demonstrated to the liver. On the venous phase, the superior mesenteric vein and portal vein all remain patent. Access removed. Hemostasis obtained with manual compression of the right common femoral artery puncture site. Patient tolerated the procedure well. No immediate complication. IMPRESSION: Successful celiac, hepatic, and SMA angiograms as above. Successful micro catheterization, angiograms, and Gel-Foam embolization of the left hepatic artery resulting in significant reduction in arterial flow to the area the liver were the percutaneous biopsy was performed. Electronically Signed   By: Jerilynn Mages.  Shick M.D.   On: 01/19/2017 10:01   US Biopsy  Result Date: 01/18/2017 INDICATION: LIVER METASTASES, UNKNOWN PRIMARY. EXAM: ULTRASOUND BIOPSY CORE LIVER MEDICATIONS: 1% LIDOCAINE ANESTHESIA/SEDATION: Moderate (conscious) sedation was employed during this procedure. A total of Versed 1.0 mg and Fentanyl 50 mcg was administered intravenously. Moderate Sedation Time: 10 minutes. The patient's level of consciousness and vital signs were monitored continuously by radiology nursing throughout the procedure under my direct supervision. FLUOROSCOPY TIME:  Fluoroscopy Time: NONE. COMPLICATIONS: None immediate. PROCEDURE: Informed written consent was obtained from the patient after a thorough discussion of the procedural risks, benefits and alternatives. All questions were addressed. Maximal Sterile Barrier Technique was utilized including caps, mask, sterile gowns, sterile gloves, sterile drape, hand hygiene and skin antiseptic. A timeout was  performed prior to the initiation of the procedure. Previous imaging reviewed. Preliminary ultrasound performed. A solid left hepatic lobe lesion was localized from a subxiphoid window. This was correlated with the abdominal MRI. Overlying skin was marked. Under sterile conditions and local anesthesia, a 17 gauge 6.8 cm access needle was advanced from an anterior subxiphoid approach into the left hepatic mass. Needle position confirmed with ultrasound. Three 1 cm 18 gauge core biopsies obtained. Samples placed in formalin. These were intact and non fragmented. Needle tract embolized with Gel-Foam. Postprocedure imaging demonstrates no hemorrhage or hematoma. Patient tolerated the biopsy well. IMPRESSION: Successful ultrasound left hepatic mass 18 gauge core biopsy Electronically Signed   By: Jerilynn Mages.  Shick M.D.   On: 01/18/2017 14:53   Ir US Guide Vasc Access Right  Result Date: 01/19/2017 INDICATION: Acute abdominal pain with acute perihepatic hemorrhage 2 hours status post percutaneous left hepatic metastasis biopsy. EXAM: SELECTIVE VISCERAL ARTERIOGRAPHY; IR EMBO ART VEN HEMORR LYMPH EXTRAV INC GUIDE ROADMAPPING; ADDITIONAL ARTERIOGRAPHY; IR ULTRASOUND GUIDANCE VASC ACCESS RIGHT MEDICATIONS: 1% lidocaine locally. ANESTHESIA/SEDATION: Moderate (conscious) sedation was employed during this procedure. A total of Versed 1.0 mg and Fentanyl 25 mcg was administered intravenously. Moderate Sedation Time: 50 minutes. The patient's level of consciousness and vital signs were monitored continuously by radiology nursing throughout the procedure under my direct supervision. CONTRAST:  1 ISOVUE-300 IOPAMIDOL (ISOVUE-300) INJECTION 61%, 1 ISOVUE-300 IOPAMIDOL (ISOVUE-300) INJECTION 61% FLUOROSCOPY TIME:  Fluoroscopy Time: 14 minutes 12 seconds (144 mGy). COMPLICATIONS: None immediate. PROCEDURE: Informed consent was obtained from the patient following explanation of the procedure, risks, benefits and alternatives. The patient  understands, agrees and consents for the procedure. All questions were addressed. A time out was performed prior to the  initiation of the procedure. Maximal barrier sterile technique utilized including caps, mask, sterile gowns, sterile gloves, large sterile drape, hand hygiene, and Betadine prep. Under sterile conditions and local anesthesia, ultrasound micropuncture access performed of the right common femoral artery. Five French sheath inserted. SOS catheter was utilized to select the celiac origin. Celiac angiogram performed. Celiac: Celiac origin is widely patent. Splenic, common hepatic, gastroduodenal artery, proper hepatic, right and left hepatic arteries are all patent. Irregularity of the hepatic vasculature is secondary to hepatomegaly and diffuse hepatic metastases. No active bleeding demonstrated. On the venous phase, the splenic vein and portal vein are all patent. Through the [place] a 5 French catheter, a Renegade STC microcatheter was advanced over a micro glidewire into the proper hepatic artery. Proper hepatic angiogram: Proper hepatic, right and left hepatic arteries are patent. Vasospasm present in the proximal right hepatic artery related to wire manipulation. Limited assessment of the vasculature because of the contrast injection and poor opacification of the left hepatic artery. Over a double angled Glidewire, the microcatheter was advanced into the left hepatic artery. Left hepatic angiogram performed. Left hepatic: Left hepatic artery is patent. This is the dominant supply to the left hepatic lobe lateral segment were the percutaneous biopsy was performed. Peripheral irregularity of the arterial vasculature related to hepatic metastases. No active extravasation, acute bleeding, or other acute vascular abnormality including AV fistula or pseudoaneurysm. Because of the significant recent acute perihepatic hemorrhage and a biopsy performed in the left hepatic lobe lateral segment, Gel-Foam  embolization of left hepatic artery will be performed. Left hepatic artery Gel-Foam embolization: Through the microcatheter, approximately 5 cc of Gel-Foam slurry was instilled into left hepatic artery. Following embolization, there is significant reduction in the left hepatic arterial peripheral vasculature and sluggish flow but the left hepatic main artery remains patent. Catheter was retracted and advanced into a left hepatic branch supplying the medial segment. Additional left hepatic angiogram performed. Additional left hepatic angiogram: Irregularity of the parenchymal staining related to diffuse hepatic metastases. Scattered peripheral occlusions present in the additional left hepatic branch related to the Gel-Foam embolization. Vasospasm noted at the catheter site. No active bleeding demonstrated. Microcatheter was removed. A final angiogram was performed through the SOS 5 French catheter of the celiac origin. Celiac: Following left hepatic Gel-Foam embolization, there is significant reduction in the arterial vascularity to the the left hepatic lobe where the biopsy was performed. Residual vasospasm present in the proximal left hepatic artery. No other significant acute vascular process. Catheter was retracted and advanced into the SMA. SMA angiogram performed. SMA angiogram: SMA is widely patent. No variant SMA supply demonstrated to the liver. On the venous phase, the superior mesenteric vein and portal vein all remain patent. Access removed. Hemostasis obtained with manual compression of the right common femoral artery puncture site. Patient tolerated the procedure well. No immediate complication. IMPRESSION: Successful celiac, hepatic, and SMA angiograms as above. Successful micro catheterization, angiograms, and Gel-Foam embolization of the left hepatic artery resulting in significant reduction in arterial flow to the area the liver were the percutaneous biopsy was performed. Electronically Signed   By:  Jerilynn Mages.  Shick M.D.   On: 01/19/2017 10:01   Dg Chest Port 1v Same Day  Result Date: 01/18/2017 CLINICAL DATA:  Acute onset of shortness of breath, vomiting and epigastric abdominal pain. Fever. Initial encounter. EXAM: PORTABLE CHEST 1 VIEW COMPARISON:  Chest radiograph performed 01/16/2017 FINDINGS: The lungs are hyperexpanded, with flattening of the hemidiaphragms, compatible with COPD. There is no  evidence of focal opacification, pleural effusion or pneumothorax. The cardiomediastinal silhouette is within normal limits. No acute osseous abnormalities are seen. IMPRESSION: Findings of COPD.  Lungs remain otherwise clear. Electronically Signed   By: Garald Balding M.D.   On: 01/18/2017 23:48   Dg Abd Portable 1v  Result Date: 01/18/2017 CLINICAL DATA:  Acute onset of shortness of breath, vomiting and epigastric abdominal pain. Recent liver biopsy. Fever. Initial encounter. EXAM: PORTABLE ABDOMEN - 1 VIEW COMPARISON:  None. FINDINGS: The visualized bowel gas pattern is unremarkable. Scattered air and stool filled loops of colon are seen; no abnormal dilatation of small bowel loops is seen to suggest small bowel obstruction. No free intra-abdominal air is identified, though evaluation for free air is limited on a single supine view. The stomach is largely filled with air. The visualized osseous structures are within normal limits; the sacroiliac joints are unremarkable in appearance. IMPRESSION: Unremarkable bowel gas pattern; no free intra-abdominal air seen. Moderate amount of stool noted in the colon. Electronically Signed   By: Garald Balding M.D.   On: 01/18/2017 23:50   [place] Guide Roadmapping  Result Date: 01/19/2017 INDICATION: Acute abdominal pain with acute perihepatic hemorrhage 2 hours status post percutaneous left hepatic metastasis biopsy. EXAM: SELECTIVE VISCERAL ARTERIOGRAPHY; IR EMBO ART VEN HEMORR LYMPH EXTRAV INC GUIDE ROADMAPPING; ADDITIONAL  ARTERIOGRAPHY; IR ULTRASOUND GUIDANCE VASC ACCESS RIGHT MEDICATIONS: 1% lidocaine locally. ANESTHESIA/SEDATION: Moderate (conscious) sedation was employed during this procedure. A total of Versed 1.0 mg and Fentanyl 25 mcg was administered intravenously. Moderate Sedation Time: 50 minutes. The patient's level of consciousness and vital signs were monitored continuously by radiology nursing throughout the procedure under my direct supervision. CONTRAST:  1 ISOVUE-300 IOPAMIDOL (ISOVUE-300) INJECTION 61%, 1 ISOVUE-300 IOPAMIDOL (ISOVUE-300) INJECTION 61% FLUOROSCOPY TIME:  Fluoroscopy Time: 14 minutes 12 seconds (144 mGy). COMPLICATIONS: None immediate. PROCEDURE: Informed consent was obtained from the patient following explanation of the procedure, risks, benefits and alternatives. The patient understands, agrees and consents for the procedure. All questions were addressed. A time out was performed prior to the initiation of the procedure. Maximal barrier sterile technique utilized including caps, mask, sterile gowns, sterile gloves, large sterile drape, hand hygiene, and Betadine prep. Under sterile conditions and local anesthesia, ultrasound micropuncture access performed of the right common femoral artery. Five French sheath inserted. SOS catheter was utilized to select the celiac origin. Celiac angiogram performed. Celiac: Celiac origin is widely patent. Splenic, common hepatic, gastroduodenal artery, proper hepatic, right and left hepatic arteries are all patent. Irregularity of the hepatic vasculature is secondary to hepatomegaly and diffuse hepatic metastases. No active bleeding demonstrated. On the venous phase, the splenic vein and portal vein are all patent. Through the [place] a 5 French catheter, a Renegade STC microcatheter was advanced over a micro glidewire into the proper hepatic artery. Proper hepatic angiogram: Proper hepatic, right and left hepatic arteries are patent. Vasospasm present in the proximal  right hepatic artery related to wire manipulation. Limited assessment of the vasculature because of the contrast injection and poor opacification of the left hepatic artery. Over a double angled Glidewire, the microcatheter was advanced into the left hepatic artery. Left hepatic angiogram performed. Left hepatic: Left hepatic artery is patent. This is the dominant supply to the left hepatic lobe lateral segment were the percutaneous biopsy was performed. Peripheral irregularity of the arterial vasculature related to hepatic metastases. No active extravasation, acute bleeding, or other acute vascular abnormality including AV  fistula or pseudoaneurysm. Because of the significant recent acute perihepatic hemorrhage and a biopsy performed in the left hepatic lobe lateral segment, Gel-Foam embolization of left hepatic artery will be performed. Left hepatic artery Gel-Foam embolization: Through the microcatheter, approximately 5 cc of Gel-Foam slurry was instilled into left hepatic artery. Following embolization, there is significant reduction in the left hepatic arterial peripheral vasculature and sluggish flow but the left hepatic main artery remains patent. Catheter was retracted and advanced into a left hepatic branch supplying the medial segment. Additional left hepatic angiogram performed. Additional left hepatic angiogram: Irregularity of the parenchymal staining related to diffuse hepatic metastases. Scattered peripheral occlusions present in the additional left hepatic branch related to the Gel-Foam embolization. Vasospasm noted at the catheter site. No active bleeding demonstrated. Microcatheter was removed. A final angiogram was performed through the SOS 5 French catheter of the celiac origin. Celiac: Following left hepatic Gel-Foam embolization, there is significant reduction in the arterial vascularity to the the left hepatic lobe where the biopsy was performed. Residual vasospasm present in the proximal left  hepatic artery. No other significant acute vascular process. Catheter was retracted and advanced into the SMA. SMA angiogram performed. SMA angiogram: SMA is widely patent. No variant SMA supply demonstrated to the liver. On the venous phase, the superior mesenteric vein and portal vein all remain patent. Access removed. Hemostasis obtained with manual compression of the right common femoral artery puncture site. Patient tolerated the procedure well. No immediate complication. IMPRESSION: Successful celiac, hepatic, and SMA angiograms as above. Successful micro catheterization, angiograms, and Gel-Foam embolization of the left hepatic artery resulting in significant reduction in arterial flow to the area the liver were the percutaneous biopsy was performed. Electronically Signed   By: Jerilynn Mages.  Shick M.D.   On: 01/19/2017 10:01   US Abdomen Limited Ruq  Result Date: 01/18/2017 CLINICAL DATA:  61 year old female with a history of multiple liver masses, status post percutaneous biopsy today's date. Interval development of abdominal pain EXAM: US ABDOMEN LIMITED - RIGHT UPPER QUADRANT COMPARISON:  Ultrasound 01/18/2017, MRI 01/15/2017 FINDINGS: Gallbladder: Contracted gallbladder with echogenic focus in the lumen compatible with cholelithiasis. Common bile duct: Diameter: 4 mm -5 mm Liver: Re- demonstration of mass within right liver lobe 7 point far cm by 5.4 cm x 7.4 cm. The biopsy of the left liver lobe on the comparison ultrasound today's date demonstrates small echogenic focus, compatible with hemostatic material. There is hyperechoic free fluid adjacent to the liver margin, both anterior and posterior to the left liver lobe. IMPRESSION: New hyperechoic free fluid adjacent to the left liver lobe, status post percutaneous biopsy, compatible with hemorrhage. Re- demonstration of liver masses, better characterized on prior MRI. These results were called by telephone at the time of interpretation on 01/18/2017 at 5:42 pm  to Dr. Annamaria Boots who verbally acknowledged these results. Electronically Signed   By: Corrie Mckusick D.O.   On: 01/18/2017 17:43    Labs:  CBC:  Recent Labs  01/18/17 1558 01/18/17 2351 01/19/17 0231 01/19/17 0631  WBC 26.3* 31.0* 33.7* 32.2*  HGB 9.0* 8.9* 8.5* 8.2*  HCT 29.7* 29.2* 27.8* 27.0*  PLT 1,047* 785* 774* 770*    COAGS:  Recent Labs  01/18/17 0005 01/18/17 1134  INR 1.27 1.11  APTT 32 36    BMP:  Recent Labs  01/10/17 1358 01/18/17 1614 01/19/17 0639  NA 133* 137 138  K 3.8 2.7* 3.5  CL 93* 87* 94*  CO2 32 34* 31  GLUCOSE 91  146* 144*  BUN 8 5* 7  CALCIUM 8.7 8.7* 8.3*  CREATININE 0.69 0.86 0.70  GFRNONAA  --  >60 >60  GFRAA  --  >60 >60    LIVER FUNCTION TESTS:  Recent Labs  01/10/17 1358 01/18/17 1614 01/19/17 0639  BILITOT 0.2 0.7 0.4  AST 22 31 50*  ALT 10 12* 15  ALKPHOS 149* 186* 148*  PROT 6.9 7.3 6.7  ALBUMIN 2.7* 2.0* 1.9*    Assessment and Plan:  Liver Mass  S/P Biopsy with subsequent bleed, then S/P Embolization by Dr. Annamaria Boots 01/18/2017  Await pathology results.  Continue current care.  Electronically Signed: Murrell Redden PA-C 01/19/2017, 12:02 PM   I spent a total of 15 Minutes at the the patient's bedside AND on the patient's hospital floor or unit, greater than 50% of which was counseling/coordinating care for s/p liver biopsy then [facility]

## 2017-01-20 ENCOUNTER — Encounter (HOSPITAL_COMMUNITY): Payer: Self-pay

## 2017-01-20 ENCOUNTER — Inpatient Hospital Stay (HOSPITAL_COMMUNITY): Payer: Medicare Other

## 2017-01-20 DIAGNOSIS — S3692XA Contusion of unspecified intra-abdominal organ, initial encounter: Secondary | ICD-10-CM

## 2017-01-20 LAB — AFP TUMOR MARKER: AFP TUMOR MARKER: 4.2 ng/mL (ref 0.0–8.3)

## 2017-01-20 LAB — CBC WITH DIFFERENTIAL/PLATELET
BASOS PCT: 0 %
Basophils Absolute: 0 10*3/uL (ref 0.0–0.1)
EOS ABS: 0 10*3/uL (ref 0.0–0.7)
Eosinophils Relative: 0 %
HCT: 22.8 % — ABNORMAL LOW (ref 36.0–46.0)
HEMOGLOBIN: 6.9 g/dL — AB (ref 12.0–15.0)
LYMPHS ABS: 1.6 10*3/uL (ref 0.7–4.0)
LYMPHS PCT: 5 %
MCH: 25.9 pg — AB (ref 26.0–34.0)
MCHC: 30.3 g/dL (ref 30.0–36.0)
MCV: 85.7 fL (ref 78.0–100.0)
Monocytes Absolute: 2.3 10*3/uL — ABNORMAL HIGH (ref 0.1–1.0)
Monocytes Relative: 7 %
NEUTROS ABS: 28.9 10*3/uL — AB (ref 1.7–7.7)
Neutrophils Relative %: 88 %
Platelets: 584 10*3/uL — ABNORMAL HIGH (ref 150–400)
RBC: 2.66 MIL/uL — ABNORMAL LOW (ref 3.87–5.11)
RDW: 16.9 % — ABNORMAL HIGH (ref 11.5–15.5)
WBC: 32.8 10*3/uL — ABNORMAL HIGH (ref 4.0–10.5)

## 2017-01-20 LAB — COMPREHENSIVE METABOLIC PANEL
ALK PHOS: 138 U/L — AB (ref 38–126)
ALT: 20 U/L (ref 14–54)
ANION GAP: 9 (ref 5–15)
AST: 56 U/L — ABNORMAL HIGH (ref 15–41)
Albumin: 1.9 g/dL — ABNORMAL LOW (ref 3.5–5.0)
BUN: 6 mg/dL (ref 6–20)
CALCIUM: 8.8 mg/dL — AB (ref 8.9–10.3)
CO2: 34 mmol/L — AB (ref 22–32)
Chloride: 95 mmol/L — ABNORMAL LOW (ref 101–111)
Creatinine, Ser: 0.35 mg/dL — ABNORMAL LOW (ref 0.44–1.00)
GFR calc non Af Amer: >60 mL/min (ref >60)
Glucose, Bld: 111 mg/dL — ABNORMAL HIGH (ref 65–99)
Potassium: 3.8 mmol/L (ref 3.5–5.1)
SODIUM: 138 mmol/L (ref 135–145)
Total Bilirubin: 0.3 mg/dL (ref 0.3–1.2)
Total Protein: 6.3 g/dL — ABNORMAL LOW (ref 6.5–8.1)

## 2017-01-20 LAB — PREPARE RBC (CROSSMATCH)

## 2017-01-20 LAB — HEPATITIS PANEL, ACUTE
HCV Ab: 0.2 s/co ratio (ref 0.0–0.9)
HEP A IGM: NEGATIVE
HEP B C IGM: NEGATIVE
HEP B S AG: NEGATIVE

## 2017-01-20 LAB — MAGNESIUM: Magnesium: 1.9 mg/dL (ref 1.7–2.4)

## 2017-01-20 LAB — CEA: CEA: 6.9 ng/mL — ABNORMAL HIGH (ref 0.0–4.7)

## 2017-01-20 LAB — HEMOGLOBIN AND HEMATOCRIT, BLOOD
HEMATOCRIT: 27.5 % — AB (ref 36.0–46.0)
Hemoglobin: 8.4 g/dL — ABNORMAL LOW (ref 12.0–15.0)

## 2017-01-20 LAB — URINE CULTURE: CULTURE: NO GROWTH

## 2017-01-20 MED ORDER — FENTANYL 25 MCG/HR TD PT72
25.0000 ug | MEDICATED_PATCH | TRANSDERMAL | Status: AC
  Administered 2017-01-20: 25 ug via TRANSDERMAL
  Filled 2017-01-20: qty 1

## 2017-01-20 MED ORDER — GABAPENTIN 400 MG PO CAPS
400.0000 mg | ORAL_CAPSULE | Freq: Three times a day (TID) | ORAL | Status: DC
  Administered 2017-01-20 – 2017-01-24 (×14): 400 mg via ORAL
  Filled 2017-01-20 (×15): qty 1

## 2017-01-20 MED ORDER — GABAPENTIN 800 MG PO TABS
400.0000 mg | ORAL_TABLET | Freq: Three times a day (TID) | ORAL | Status: DC
  Filled 2017-01-20: qty 0.5

## 2017-01-20 MED ORDER — IOPAMIDOL (ISOVUE-370) INJECTION 76%
INTRAVENOUS | Status: AC
  Administered 2017-01-20: 100 mL
  Filled 2017-01-20: qty 100

## 2017-01-20 MED ORDER — SODIUM CHLORIDE 0.9 % IV SOLN
Freq: Once | INTRAVENOUS | Status: AC
  Administered 2017-01-20: 07:00:00 via INTRAVENOUS

## 2017-01-20 NOTE — Progress Notes (Signed)
Referring Physician(s): Letvak,Richard I  Supervising Physician: Daryll Brod  Patient Status:  [facility] - In-pt  Chief Complaint:  Liver mass S/P biopsy with subsequent bleeding then S/P embolization by Dr.  Annamaria Boots 2/24  Subjective:  She looks a little better today. Family member at bedside. No new complaints.  Allergies: Endoxcin [lidocaine-menthol]; Nubain [nalbuphine hcl]; Penicillins; and Robaxin [methocarbamol]  Medications: Prior to Admission medications   Medication Sig Start Date End Date Taking? Authorizing Provider  Aspirin-Salicylamide-Caffeine (BC HEADACHE POWDER PO) Take 1 Package by mouth 2 (two) times daily. Pain   Yes Historical Provider, MD  BIOTIN 5000 PO Take by mouth daily.   Yes Historical Provider, MD  fentaNYL (DURAGESIC - DOSED MCG/HR) 25 MCG/HR patch Place 25 mcg onto the skin every 3 (three) days.  12/13/14  Yes Historical Provider, MD  furosemide (LASIX) 40 MG tablet TAKE 1 TABLET BY MOUTH ONCE A DAY 12/20/16  Yes Venia Carbon, MD  gabapentin (NEURONTIN) 800 MG tablet Take 1 tablet (800 mg total) by mouth 3 (three) times daily. 01/09/17  Yes Venia Carbon, MD  HYDROcodone-acetaminophen (NORCO/VICODIN) 5-325 MG per tablet Take 1 tablet by mouth 3 (three) times daily as needed for pain.   Yes Historical Provider, MD  LORazepam (ATIVAN) 0.5 MG tablet 1-2 tablets 1 hour before MRI. May repeat as needed. 01/14/17  Yes Venia Carbon, MD  Multiple Vitamins-Minerals (MULTIVITAMIN WITH MINERALS) tablet Take 1 tablet by mouth daily.   Yes Historical Provider, MD  promethazine (PHENERGAN) 25 MG tablet TAKE 0.5-1 TABLETS (12.5-25 MG TOTAL) BY MOUTH 3 (THREE) TIMES DAILY AS NEEDED FOR NAUSEA. 06/28/16  Yes Venia Carbon, MD  sennosides-docusate sodium (SENOKOT-S) 8.6-50 MG tablet Take 2 tablets by mouth daily.   Yes Historical Provider, MD     Vital Signs: BP 126/82   Pulse 89   Temp 98.2 F (36.8 C) (Oral)   Resp 15   Ht 5\' 3"  (1.6 m)   Wt 101 lb 4.8  oz (45.9 kg)   SpO2 100%   BMI 17.94 kg/m   Physical Exam Awake and alert NAD Lying in bed RUQ tender with firmness c/w mass Hgb dropped again to 6.9  Imaging: Dg Chest 2 View  Result Date: 01/16/2017 CLINICAL DATA:  Liver metastases.  Tobacco use. EXAM: CHEST  2 VIEW COMPARISON:  August 25, 2015 FINDINGS: There are nipple shadows bilaterally. There is no evident edema or consolidation. No pulmonary nodular lesion is demonstrable by radiography. Heart size and pulmonary vascularity are normal. No adenopathy. There is atherosclerotic calcification in the aorta. No blastic or lytic bone lesions. IMPRESSION: Aortic atherosclerosis. No edema or consolidation. No evident mass or adenopathy. Electronically Signed   By: Lowella Grip III M.D.   On: 01/16/2017 14:34   Ir Angiogram Visceral Selective  Result Date: 01/19/2017 INDICATION: Acute abdominal pain with acute perihepatic hemorrhage 2 hours status post percutaneous left hepatic metastasis biopsy. EXAM: SELECTIVE VISCERAL ARTERIOGRAPHY; IR EMBO ART VEN HEMORR LYMPH EXTRAV INC GUIDE ROADMAPPING; ADDITIONAL ARTERIOGRAPHY; IR ULTRASOUND GUIDANCE VASC ACCESS RIGHT MEDICATIONS: 1% lidocaine locally. ANESTHESIA/SEDATION: Moderate (conscious) sedation was employed during this procedure. A total of Versed 1.0 mg and Fentanyl 25 mcg was administered intravenously. Moderate Sedation Time: 50 minutes. The patient's level of consciousness and vital signs were monitored continuously by radiology nursing throughout the procedure under my direct supervision. CONTRAST:  1 ISOVUE-300 IOPAMIDOL (ISOVUE-300) INJECTION 61%, 1 ISOVUE-300 IOPAMIDOL (ISOVUE-300) INJECTION 61% FLUOROSCOPY TIME:  Fluoroscopy Time: 14 minutes 12 seconds (144  mGy). COMPLICATIONS: None immediate. PROCEDURE: Informed consent was obtained from the patient following explanation of the procedure, risks, benefits and alternatives. The patient understands, agrees and consents for the  procedure. All questions were addressed. A time out was performed prior to the initiation of the procedure. Maximal barrier sterile technique utilized including caps, mask, sterile gowns, sterile gloves, large sterile drape, hand hygiene, and Betadine prep. Under sterile conditions and local anesthesia, ultrasound micropuncture access performed of the right common femoral artery. Five French sheath inserted. SOS catheter was utilized to select the celiac origin. Celiac angiogram performed. Celiac: Celiac origin is widely patent. Splenic, common hepatic, gastroduodenal artery, proper hepatic, right and left hepatic arteries are all patent. Irregularity of the hepatic vasculature is secondary to hepatomegaly and diffuse hepatic metastases. No active bleeding demonstrated. On the venous phase, the splenic vein and portal vein are all patent. Through the [place] a 5 French catheter, a Renegade STC microcatheter was advanced over a micro glidewire into the proper hepatic artery. Proper hepatic angiogram: Proper hepatic, right and left hepatic arteries are patent. Vasospasm present in the proximal right hepatic artery related to wire manipulation. Limited assessment of the vasculature because of the contrast injection and poor opacification of the left hepatic artery. Over a double angled Glidewire, the microcatheter was advanced into the left hepatic artery. Left hepatic angiogram performed. Left hepatic: Left hepatic artery is patent. This is the dominant supply to the left hepatic lobe lateral segment were the percutaneous biopsy was performed. Peripheral irregularity of the arterial vasculature related to hepatic metastases. No active extravasation, acute bleeding, or other acute vascular abnormality including AV fistula or pseudoaneurysm. Because of the significant recent acute perihepatic hemorrhage and a biopsy performed in the left hepatic lobe lateral segment, Gel-Foam embolization of left hepatic artery will be  performed. Left hepatic artery Gel-Foam embolization: Through the microcatheter, approximately 5 cc of Gel-Foam slurry was instilled into left hepatic artery. Following embolization, there is significant reduction in the left hepatic arterial peripheral vasculature and sluggish flow but the left hepatic main artery remains patent. Catheter was retracted and advanced into a left hepatic branch supplying the medial segment. Additional left hepatic angiogram performed. Additional left hepatic angiogram: Irregularity of the parenchymal staining related to diffuse hepatic metastases. Scattered peripheral occlusions present in the additional left hepatic branch related to the Gel-Foam embolization. Vasospasm noted at the catheter site. No active bleeding demonstrated. Microcatheter was removed. A final angiogram was performed through the SOS 5 French catheter of the celiac origin. Celiac: Following left hepatic Gel-Foam embolization, there is significant reduction in the arterial vascularity to the the left hepatic lobe where the biopsy was performed. Residual vasospasm present in the proximal left hepatic artery. No other significant acute vascular process. Catheter was retracted and advanced into the SMA. SMA angiogram performed. SMA angiogram: SMA is widely patent. No variant SMA supply demonstrated to the liver. On the venous phase, the superior mesenteric vein and portal vein all remain patent. Access removed. Hemostasis obtained with manual compression of the right common femoral artery puncture site. Patient tolerated the procedure well. No immediate complication. IMPRESSION: Successful celiac, hepatic, and SMA angiograms as above. Successful micro catheterization, angiograms, and Gel-Foam embolization of the left hepatic artery resulting in significant reduction in arterial flow to the area the liver were the percutaneous biopsy was performed. Electronically Signed   By: Jerilynn Mages.  Shick M.D.   On: 01/19/2017 10:01    Ir Angiogram Visceral Selective  Result Date: 01/19/2017 INDICATION: Acute abdominal  pain with acute perihepatic hemorrhage 2 hours status post percutaneous left hepatic metastasis biopsy. EXAM: SELECTIVE VISCERAL ARTERIOGRAPHY; IR EMBO ART VEN HEMORR LYMPH EXTRAV INC GUIDE ROADMAPPING; ADDITIONAL ARTERIOGRAPHY; IR ULTRASOUND GUIDANCE VASC ACCESS RIGHT MEDICATIONS: 1% lidocaine locally. ANESTHESIA/SEDATION: Moderate (conscious) sedation was employed during this procedure. A total of Versed 1.0 mg and Fentanyl 25 mcg was administered intravenously. Moderate Sedation Time: 50 minutes. The patient's level of consciousness and vital signs were monitored continuously by radiology nursing throughout the procedure under my direct supervision. CONTRAST:  1 ISOVUE-300 IOPAMIDOL (ISOVUE-300) INJECTION 61%, 1 ISOVUE-300 IOPAMIDOL (ISOVUE-300) INJECTION 61% FLUOROSCOPY TIME:  Fluoroscopy Time: 14 minutes 12 seconds (144 mGy). COMPLICATIONS: None immediate. PROCEDURE: Informed consent was obtained from the patient following explanation of the procedure, risks, benefits and alternatives. The patient understands, agrees and consents for the procedure. All questions were addressed. A time out was performed prior to the initiation of the procedure. Maximal barrier sterile technique utilized including caps, mask, sterile gowns, sterile gloves, large sterile drape, hand hygiene, and Betadine prep. Under sterile conditions and local anesthesia, ultrasound micropuncture access performed of the right common femoral artery. Five French sheath inserted. SOS catheter was utilized to select the celiac origin. Celiac angiogram performed. Celiac: Celiac origin is widely patent. Splenic, common hepatic, gastroduodenal artery, proper hepatic, right and left hepatic arteries are all patent. Irregularity of the hepatic vasculature is secondary to hepatomegaly and diffuse hepatic metastases. No active bleeding demonstrated. On the venous  phase, the splenic vein and portal vein are all patent. Through the [place] a 5 French catheter, a Renegade STC microcatheter was advanced over a micro glidewire into the proper hepatic artery. Proper hepatic angiogram: Proper hepatic, right and left hepatic arteries are patent. Vasospasm present in the proximal right hepatic artery related to wire manipulation. Limited assessment of the vasculature because of the contrast injection and poor opacification of the left hepatic artery. Over a double angled Glidewire, the microcatheter was advanced into the left hepatic artery. Left hepatic angiogram performed. Left hepatic: Left hepatic artery is patent. This is the dominant supply to the left hepatic lobe lateral segment were the percutaneous biopsy was performed. Peripheral irregularity of the arterial vasculature related to hepatic metastases. No active extravasation, acute bleeding, or other acute vascular abnormality including AV fistula or pseudoaneurysm. Because of the significant recent acute perihepatic hemorrhage and a biopsy performed in the left hepatic lobe lateral segment, Gel-Foam embolization of left hepatic artery will be performed. Left hepatic artery Gel-Foam embolization: Through the microcatheter, approximately 5 cc of Gel-Foam slurry was instilled into left hepatic artery. Following embolization, there is significant reduction in the left hepatic arterial peripheral vasculature and sluggish flow but the left hepatic main artery remains patent. Catheter was retracted and advanced into a left hepatic branch supplying the medial segment. Additional left hepatic angiogram performed. Additional left hepatic angiogram: Irregularity of the parenchymal staining related to diffuse hepatic metastases. Scattered peripheral occlusions present in the additional left hepatic branch related to the Gel-Foam embolization. Vasospasm noted at the catheter site. No active bleeding demonstrated. Microcatheter was removed.  A final angiogram was performed through the SOS 5 French catheter of the celiac origin. Celiac: Following left hepatic Gel-Foam embolization, there is significant reduction in the arterial vascularity to the the left hepatic lobe where the biopsy was performed. Residual vasospasm present in the proximal left hepatic artery. No other significant acute vascular process. Catheter was retracted and advanced into the SMA. SMA angiogram performed. SMA angiogram: SMA is widely patent.  No variant SMA supply demonstrated to the liver. On the venous phase, the superior mesenteric vein and portal vein all remain patent. Access removed. Hemostasis obtained with manual compression of the right common femoral artery puncture site. Patient tolerated the procedure well. No immediate complication. IMPRESSION: Successful celiac, hepatic, and SMA angiograms as above. Successful micro catheterization, angiograms, and Gel-Foam embolization of the left hepatic artery resulting in significant reduction in arterial flow to the area the liver were the percutaneous biopsy was performed. Electronically Signed   By: Jerilynn Mages.  Shick M.D.   On: 01/19/2017 10:01   Ir Angiogram Selective Each Additional Vessel  Result Date: 01/19/2017 INDICATION: Acute abdominal pain with acute perihepatic hemorrhage 2 hours status post percutaneous left hepatic metastasis biopsy. EXAM: SELECTIVE VISCERAL ARTERIOGRAPHY; IR EMBO ART VEN HEMORR LYMPH EXTRAV INC GUIDE ROADMAPPING; ADDITIONAL ARTERIOGRAPHY; IR ULTRASOUND GUIDANCE VASC ACCESS RIGHT MEDICATIONS: 1% lidocaine locally. ANESTHESIA/SEDATION: Moderate (conscious) sedation was employed during this procedure. A total of Versed 1.0 mg and Fentanyl 25 mcg was administered intravenously. Moderate Sedation Time: 50 minutes. The patient's level of consciousness and vital signs were monitored continuously by radiology nursing throughout the procedure under my direct supervision. CONTRAST:  1 ISOVUE-300 IOPAMIDOL  (ISOVUE-300) INJECTION 61%, 1 ISOVUE-300 IOPAMIDOL (ISOVUE-300) INJECTION 61% FLUOROSCOPY TIME:  Fluoroscopy Time: 14 minutes 12 seconds (144 mGy). COMPLICATIONS: None immediate. PROCEDURE: Informed consent was obtained from the patient following explanation of the procedure, risks, benefits and alternatives. The patient understands, agrees and consents for the procedure. All questions were addressed. A time out was performed prior to the initiation of the procedure. Maximal barrier sterile technique utilized including caps, mask, sterile gowns, sterile gloves, large sterile drape, hand hygiene, and Betadine prep. Under sterile conditions and local anesthesia, ultrasound micropuncture access performed of the right common femoral artery. Five French sheath inserted. SOS catheter was utilized to select the celiac origin. Celiac angiogram performed. Celiac: Celiac origin is widely patent. Splenic, common hepatic, gastroduodenal artery, proper hepatic, right and left hepatic arteries are all patent. Irregularity of the hepatic vasculature is secondary to hepatomegaly and diffuse hepatic metastases. No active bleeding demonstrated. On the venous phase, the splenic vein and portal vein are all patent. Through the [place] a 5 French catheter, a Renegade STC microcatheter was advanced over a micro glidewire into the proper hepatic artery. Proper hepatic angiogram: Proper hepatic, right and left hepatic arteries are patent. Vasospasm present in the proximal right hepatic artery related to wire manipulation. Limited assessment of the vasculature because of the contrast injection and poor opacification of the left hepatic artery. Over a double angled Glidewire, the microcatheter was advanced into the left hepatic artery. Left hepatic angiogram performed. Left hepatic: Left hepatic artery is patent. This is the dominant supply to the left hepatic lobe lateral segment were the percutaneous biopsy was performed. Peripheral  irregularity of the arterial vasculature related to hepatic metastases. No active extravasation, acute bleeding, or other acute vascular abnormality including AV fistula or pseudoaneurysm. Because of the significant recent acute perihepatic hemorrhage and a biopsy performed in the left hepatic lobe lateral segment, Gel-Foam embolization of left hepatic artery will be performed. Left hepatic artery Gel-Foam embolization: Through the microcatheter, approximately 5 cc of Gel-Foam slurry was instilled into left hepatic artery. Following embolization, there is significant reduction in the left hepatic arterial peripheral vasculature and sluggish flow but the left hepatic main artery remains patent. Catheter was retracted and advanced into a left hepatic branch supplying the medial segment. Additional left hepatic angiogram performed. Additional  left hepatic angiogram: Irregularity of the parenchymal staining related to diffuse hepatic metastases. Scattered peripheral occlusions present in the additional left hepatic branch related to the Gel-Foam embolization. Vasospasm noted at the catheter site. No active bleeding demonstrated. Microcatheter was removed. A final angiogram was performed through the SOS 5 French catheter of the celiac origin. Celiac: Following left hepatic Gel-Foam embolization, there is significant reduction in the arterial vascularity to the the left hepatic lobe where the biopsy was performed. Residual vasospasm present in the proximal left hepatic artery. No other significant acute vascular process. Catheter was retracted and advanced into the SMA. SMA angiogram performed. SMA angiogram: SMA is widely patent. No variant SMA supply demonstrated to the liver. On the venous phase, the superior mesenteric vein and portal vein all remain patent. Access removed. Hemostasis obtained with manual compression of the right common femoral artery puncture site. Patient tolerated the procedure well. No immediate  complication. IMPRESSION: Successful celiac, hepatic, and SMA angiograms as above. Successful micro catheterization, angiograms, and Gel-Foam embolization of the left hepatic artery resulting in significant reduction in arterial flow to the area the liver were the percutaneous biopsy was performed. Electronically Signed   By: Jerilynn Mages.  Shick M.D.   On: 01/19/2017 10:01   Ir Angiogram Selective Each Additional Vessel  Result Date: 01/19/2017 INDICATION: Acute abdominal pain with acute perihepatic hemorrhage 2 hours status post percutaneous left hepatic metastasis biopsy. EXAM: SELECTIVE VISCERAL ARTERIOGRAPHY; IR EMBO ART VEN HEMORR LYMPH EXTRAV INC GUIDE ROADMAPPING; ADDITIONAL ARTERIOGRAPHY; IR ULTRASOUND GUIDANCE VASC ACCESS RIGHT MEDICATIONS: 1% lidocaine locally. ANESTHESIA/SEDATION: Moderate (conscious) sedation was employed during this procedure. A total of Versed 1.0 mg and Fentanyl 25 mcg was administered intravenously. Moderate Sedation Time: 50 minutes. The patient's level of consciousness and vital signs were monitored continuously by radiology nursing throughout the procedure under my direct supervision. CONTRAST:  1 ISOVUE-300 IOPAMIDOL (ISOVUE-300) INJECTION 61%, 1 ISOVUE-300 IOPAMIDOL (ISOVUE-300) INJECTION 61% FLUOROSCOPY TIME:  Fluoroscopy Time: 14 minutes 12 seconds (144 mGy). COMPLICATIONS: None immediate. PROCEDURE: Informed consent was obtained from the patient following explanation of the procedure, risks, benefits and alternatives. The patient understands, agrees and consents for the procedure. All questions were addressed. A time out was performed prior to the initiation of the procedure. Maximal barrier sterile technique utilized including caps, mask, sterile gowns, sterile gloves, large sterile drape, hand hygiene, and Betadine prep. Under sterile conditions and local anesthesia, ultrasound micropuncture access performed of the right common femoral artery. Five French sheath inserted. SOS  catheter was utilized to select the celiac origin. Celiac angiogram performed. Celiac: Celiac origin is widely patent. Splenic, common hepatic, gastroduodenal artery, proper hepatic, right and left hepatic arteries are all patent. Irregularity of the hepatic vasculature is secondary to hepatomegaly and diffuse hepatic metastases. No active bleeding demonstrated. On the venous phase, the splenic vein and portal vein are all patent. Through the [place] a 5 French catheter, a Renegade STC microcatheter was advanced over a micro glidewire into the proper hepatic artery. Proper hepatic angiogram: Proper hepatic, right and left hepatic arteries are patent. Vasospasm present in the proximal right hepatic artery related to wire manipulation. Limited assessment of the vasculature because of the contrast injection and poor opacification of the left hepatic artery. Over a double angled Glidewire, the microcatheter was advanced into the left hepatic artery. Left hepatic angiogram performed. Left hepatic: Left hepatic artery is patent. This is the dominant supply to the left hepatic lobe lateral segment were the percutaneous biopsy was performed. Peripheral irregularity of the  arterial vasculature related to hepatic metastases. No active extravasation, acute bleeding, or other acute vascular abnormality including AV fistula or pseudoaneurysm. Because of the significant recent acute perihepatic hemorrhage and a biopsy performed in the left hepatic lobe lateral segment, Gel-Foam embolization of left hepatic artery will be performed. Left hepatic artery Gel-Foam embolization: Through the microcatheter, approximately 5 cc of Gel-Foam slurry was instilled into left hepatic artery. Following embolization, there is significant reduction in the left hepatic arterial peripheral vasculature and sluggish flow but the left hepatic main artery remains patent. Catheter was retracted and advanced into a left hepatic branch supplying the medial  segment. Additional left hepatic angiogram performed. Additional left hepatic angiogram: Irregularity of the parenchymal staining related to diffuse hepatic metastases. Scattered peripheral occlusions present in the additional left hepatic branch related to the Gel-Foam embolization. Vasospasm noted at the catheter site. No active bleeding demonstrated. Microcatheter was removed. A final angiogram was performed through the SOS 5 French catheter of the celiac origin. Celiac: Following left hepatic Gel-Foam embolization, there is significant reduction in the arterial vascularity to the the left hepatic lobe where the biopsy was performed. Residual vasospasm present in the proximal left hepatic artery. No other significant acute vascular process. Catheter was retracted and advanced into the SMA. SMA angiogram performed. SMA angiogram: SMA is widely patent. No variant SMA supply demonstrated to the liver. On the venous phase, the superior mesenteric vein and portal vein all remain patent. Access removed. Hemostasis obtained with manual compression of the right common femoral artery puncture site. Patient tolerated the procedure well. No immediate complication. IMPRESSION: Successful celiac, hepatic, and SMA angiograms as above. Successful micro catheterization, angiograms, and Gel-Foam embolization of the left hepatic artery resulting in significant reduction in arterial flow to the area the liver were the percutaneous biopsy was performed. Electronically Signed   By: Jerilynn Mages.  Shick M.D.   On: 01/19/2017 10:01   Ir Angiogram Selective Each Additional Vessel  Result Date: 01/19/2017 INDICATION: Acute abdominal pain with acute perihepatic hemorrhage 2 hours status post percutaneous left hepatic metastasis biopsy. EXAM: SELECTIVE VISCERAL ARTERIOGRAPHY; IR EMBO ART VEN HEMORR LYMPH EXTRAV INC GUIDE ROADMAPPING; ADDITIONAL ARTERIOGRAPHY; IR ULTRASOUND GUIDANCE VASC ACCESS RIGHT MEDICATIONS: 1% lidocaine locally.  ANESTHESIA/SEDATION: Moderate (conscious) sedation was employed during this procedure. A total of Versed 1.0 mg and Fentanyl 25 mcg was administered intravenously. Moderate Sedation Time: 50 minutes. The patient's level of consciousness and vital signs were monitored continuously by radiology nursing throughout the procedure under my direct supervision. CONTRAST:  1 ISOVUE-300 IOPAMIDOL (ISOVUE-300) INJECTION 61%, 1 ISOVUE-300 IOPAMIDOL (ISOVUE-300) INJECTION 61% FLUOROSCOPY TIME:  Fluoroscopy Time: 14 minutes 12 seconds (144 mGy). COMPLICATIONS: None immediate. PROCEDURE: Informed consent was obtained from the patient following explanation of the procedure, risks, benefits and alternatives. The patient understands, agrees and consents for the procedure. All questions were addressed. A time out was performed prior to the initiation of the procedure. Maximal barrier sterile technique utilized including caps, mask, sterile gowns, sterile gloves, large sterile drape, hand hygiene, and Betadine prep. Under sterile conditions and local anesthesia, ultrasound micropuncture access performed of the right common femoral artery. Five French sheath inserted. SOS catheter was utilized to select the celiac origin. Celiac angiogram performed. Celiac: Celiac origin is widely patent. Splenic, common hepatic, gastroduodenal artery, proper hepatic, right and left hepatic arteries are all patent. Irregularity of the hepatic vasculature is secondary to hepatomegaly and diffuse hepatic metastases. No active bleeding demonstrated. On the venous phase, the splenic vein and portal vein are  all patent. Through the [place] a 5 French catheter, a Renegade STC microcatheter was advanced over a micro glidewire into the proper hepatic artery. Proper hepatic angiogram: Proper hepatic, right and left hepatic arteries are patent. Vasospasm present in the proximal right hepatic artery related to wire manipulation. Limited assessment of the vasculature  because of the contrast injection and poor opacification of the left hepatic artery. Over a double angled Glidewire, the microcatheter was advanced into the left hepatic artery. Left hepatic angiogram performed. Left hepatic: Left hepatic artery is patent. This is the dominant supply to the left hepatic lobe lateral segment were the percutaneous biopsy was performed. Peripheral irregularity of the arterial vasculature related to hepatic metastases. No active extravasation, acute bleeding, or other acute vascular abnormality including AV fistula or pseudoaneurysm. Because of the significant recent acute perihepatic hemorrhage and a biopsy performed in the left hepatic lobe lateral segment, Gel-Foam embolization of left hepatic artery will be performed. Left hepatic artery Gel-Foam embolization: Through the microcatheter, approximately 5 cc of Gel-Foam slurry was instilled into left hepatic artery. Following embolization, there is significant reduction in the left hepatic arterial peripheral vasculature and sluggish flow but the left hepatic main artery remains patent. Catheter was retracted and advanced into a left hepatic branch supplying the medial segment. Additional left hepatic angiogram performed. Additional left hepatic angiogram: Irregularity of the parenchymal staining related to diffuse hepatic metastases. Scattered peripheral occlusions present in the additional left hepatic branch related to the Gel-Foam embolization. Vasospasm noted at the catheter site. No active bleeding demonstrated. Microcatheter was removed. A final angiogram was performed through the SOS 5 French catheter of the celiac origin. Celiac: Following left hepatic Gel-Foam embolization, there is significant reduction in the arterial vascularity to the the left hepatic lobe where the biopsy was performed. Residual vasospasm present in the proximal left hepatic artery. No other significant acute vascular process. Catheter was retracted and  advanced into the SMA. SMA angiogram performed. SMA angiogram: SMA is widely patent. No variant SMA supply demonstrated to the liver. On the venous phase, the superior mesenteric vein and portal vein all remain patent. Access removed. Hemostasis obtained with manual compression of the right common femoral artery puncture site. Patient tolerated the procedure well. No immediate complication. IMPRESSION: Successful celiac, hepatic, and SMA angiograms as above. Successful micro catheterization, angiograms, and Gel-Foam embolization of the left hepatic artery resulting in significant reduction in arterial flow to the area the liver were the percutaneous biopsy was performed. Electronically Signed   By: Jerilynn Mages.  Shick M.D.   On: 01/19/2017 10:01   US Biopsy  Result Date: 01/18/2017 INDICATION: LIVER METASTASES, UNKNOWN PRIMARY. EXAM: ULTRASOUND BIOPSY CORE LIVER MEDICATIONS: 1% LIDOCAINE ANESTHESIA/SEDATION: Moderate (conscious) sedation was employed during this procedure. A total of Versed 1.0 mg and Fentanyl 50 mcg was administered intravenously. Moderate Sedation Time: 10 minutes. The patient's level of consciousness and vital signs were monitored continuously by radiology nursing throughout the procedure under my direct supervision. FLUOROSCOPY TIME:  Fluoroscopy Time: NONE. COMPLICATIONS: None immediate. PROCEDURE: Informed written consent was obtained from the patient after a thorough discussion of the procedural risks, benefits and alternatives. All questions were addressed. Maximal Sterile Barrier Technique was utilized including caps, mask, sterile gowns, sterile gloves, sterile drape, hand hygiene and skin antiseptic. A timeout was performed prior to the initiation of the procedure. Previous imaging reviewed. Preliminary ultrasound performed. A solid left hepatic lobe lesion was localized from a subxiphoid window. This was correlated with the abdominal MRI. Overlying skin was  marked. Under sterile conditions and  local anesthesia, a 17 gauge 6.8 cm access needle was advanced from an anterior subxiphoid approach into the left hepatic mass. Needle position confirmed with ultrasound. Three 1 cm 18 gauge core biopsies obtained. Samples placed in formalin. These were intact and non fragmented. Needle tract embolized with Gel-Foam. Postprocedure imaging demonstrates no hemorrhage or hematoma. Patient tolerated the biopsy well. IMPRESSION: Successful ultrasound left hepatic mass 18 gauge core biopsy Electronically Signed   By: Jerilynn Mages.  Shick M.D.   On: 01/18/2017 14:53   Ir US Guide Vasc Access Right  Result Date: 01/19/2017 INDICATION: Acute abdominal pain with acute perihepatic hemorrhage 2 hours status post percutaneous left hepatic metastasis biopsy. EXAM: SELECTIVE VISCERAL ARTERIOGRAPHY; IR EMBO ART VEN HEMORR LYMPH EXTRAV INC GUIDE ROADMAPPING; ADDITIONAL ARTERIOGRAPHY; IR ULTRASOUND GUIDANCE VASC ACCESS RIGHT MEDICATIONS: 1% lidocaine locally. ANESTHESIA/SEDATION: Moderate (conscious) sedation was employed during this procedure. A total of Versed 1.0 mg and Fentanyl 25 mcg was administered intravenously. Moderate Sedation Time: 50 minutes. The patient's level of consciousness and vital signs were monitored continuously by radiology nursing throughout the procedure under my direct supervision. CONTRAST:  1 ISOVUE-300 IOPAMIDOL (ISOVUE-300) INJECTION 61%, 1 ISOVUE-300 IOPAMIDOL (ISOVUE-300) INJECTION 61% FLUOROSCOPY TIME:  Fluoroscopy Time: 14 minutes 12 seconds (144 mGy). COMPLICATIONS: None immediate. PROCEDURE: Informed consent was obtained from the patient following explanation of the procedure, risks, benefits and alternatives. The patient understands, agrees and consents for the procedure. All questions were addressed. A time out was performed prior to the initiation of the procedure. Maximal barrier sterile technique utilized including caps, mask, sterile gowns, sterile gloves, large sterile drape, hand hygiene, and  Betadine prep. Under sterile conditions and local anesthesia, ultrasound micropuncture access performed of the right common femoral artery. Five French sheath inserted. SOS catheter was utilized to select the celiac origin. Celiac angiogram performed. Celiac: Celiac origin is widely patent. Splenic, common hepatic, gastroduodenal artery, proper hepatic, right and left hepatic arteries are all patent. Irregularity of the hepatic vasculature is secondary to hepatomegaly and diffuse hepatic metastases. No active bleeding demonstrated. On the venous phase, the splenic vein and portal vein are all patent. Through the [place] a 5 French catheter, a Renegade STC microcatheter was advanced over a micro glidewire into the proper hepatic artery. Proper hepatic angiogram: Proper hepatic, right and left hepatic arteries are patent. Vasospasm present in the proximal right hepatic artery related to wire manipulation. Limited assessment of the vasculature because of the contrast injection and poor opacification of the left hepatic artery. Over a double angled Glidewire, the microcatheter was advanced into the left hepatic artery. Left hepatic angiogram performed. Left hepatic: Left hepatic artery is patent. This is the dominant supply to the left hepatic lobe lateral segment were the percutaneous biopsy was performed. Peripheral irregularity of the arterial vasculature related to hepatic metastases. No active extravasation, acute bleeding, or other acute vascular abnormality including AV fistula or pseudoaneurysm. Because of the significant recent acute perihepatic hemorrhage and a biopsy performed in the left hepatic lobe lateral segment, Gel-Foam embolization of left hepatic artery will be performed. Left hepatic artery Gel-Foam embolization: Through the microcatheter, approximately 5 cc of Gel-Foam slurry was instilled into left hepatic artery. Following embolization, there is significant reduction in the left hepatic arterial  peripheral vasculature and sluggish flow but the left hepatic main artery remains patent. Catheter was retracted and advanced into a left hepatic branch supplying the medial segment. Additional left hepatic angiogram performed. Additional left hepatic angiogram: Irregularity of the parenchymal  staining related to diffuse hepatic metastases. Scattered peripheral occlusions present in the additional left hepatic branch related to the Gel-Foam embolization. Vasospasm noted at the catheter site. No active bleeding demonstrated. Microcatheter was removed. A final angiogram was performed through the SOS 5 French catheter of the celiac origin. Celiac: Following left hepatic Gel-Foam embolization, there is significant reduction in the arterial vascularity to the the left hepatic lobe where the biopsy was performed. Residual vasospasm present in the proximal left hepatic artery. No other significant acute vascular process. Catheter was retracted and advanced into the SMA. SMA angiogram performed. SMA angiogram: SMA is widely patent. No variant SMA supply demonstrated to the liver. On the venous phase, the superior mesenteric vein and portal vein all remain patent. Access removed. Hemostasis obtained with manual compression of the right common femoral artery puncture site. Patient tolerated the procedure well. No immediate complication. IMPRESSION: Successful celiac, hepatic, and SMA angiograms as above. Successful micro catheterization, angiograms, and Gel-Foam embolization of the left hepatic artery resulting in significant reduction in arterial flow to the area the liver were the percutaneous biopsy was performed. Electronically Signed   By: Jerilynn Mages.  Shick M.D.   On: 01/19/2017 10:01   Dg Chest Port 1v Same Day  Result Date: 01/18/2017 CLINICAL DATA:  Acute onset of shortness of breath, vomiting and epigastric abdominal pain. Fever. Initial encounter. EXAM: PORTABLE CHEST 1 VIEW COMPARISON:  Chest radiograph performed  01/16/2017 FINDINGS: The lungs are hyperexpanded, with flattening of the hemidiaphragms, compatible with COPD. There is no evidence of focal opacification, pleural effusion or pneumothorax. The cardiomediastinal silhouette is within normal limits. No acute osseous abnormalities are seen. IMPRESSION: Findings of COPD.  Lungs remain otherwise clear. Electronically Signed   By: Garald Balding M.D.   On: 01/18/2017 23:48   Dg Abd Portable 1v  Result Date: 01/18/2017 CLINICAL DATA:  Acute onset of shortness of breath, vomiting and epigastric abdominal pain. Recent liver biopsy. Fever. Initial encounter. EXAM: PORTABLE ABDOMEN - 1 VIEW COMPARISON:  None. FINDINGS: The visualized bowel gas pattern is unremarkable. Scattered air and stool filled loops of colon are seen; no abnormal dilatation of small bowel loops is seen to suggest small bowel obstruction. No free intra-abdominal air is identified, though evaluation for free air is limited on a single supine view. The stomach is largely filled with air. The visualized osseous structures are within normal limits; the sacroiliac joints are unremarkable in appearance. IMPRESSION: Unremarkable bowel gas pattern; no free intra-abdominal air seen. Moderate amount of stool noted in the colon. Electronically Signed   By: Garald Balding M.D.   On: 01/18/2017 23:50   [place] Guide Roadmapping  Result Date: 01/19/2017 INDICATION: Acute abdominal pain with acute perihepatic hemorrhage 2 hours status post percutaneous left hepatic metastasis biopsy. EXAM: SELECTIVE VISCERAL ARTERIOGRAPHY; IR EMBO ART VEN HEMORR LYMPH EXTRAV INC GUIDE ROADMAPPING; ADDITIONAL ARTERIOGRAPHY; IR ULTRASOUND GUIDANCE VASC ACCESS RIGHT MEDICATIONS: 1% lidocaine locally. ANESTHESIA/SEDATION: Moderate (conscious) sedation was employed during this procedure. A total of Versed 1.0 mg and Fentanyl 25 mcg was administered intravenously. Moderate Sedation Time: 50 minutes.  The patient's level of consciousness and vital signs were monitored continuously by radiology nursing throughout the procedure under my direct supervision. CONTRAST:  1 ISOVUE-300 IOPAMIDOL (ISOVUE-300) INJECTION 61%, 1 ISOVUE-300 IOPAMIDOL (ISOVUE-300) INJECTION 61% FLUOROSCOPY TIME:  Fluoroscopy Time: 14 minutes 12 seconds (144 mGy). COMPLICATIONS: None immediate. PROCEDURE: Informed consent was obtained from the patient following explanation of the procedure, risks,  benefits and alternatives. The patient understands, agrees and consents for the procedure. All questions were addressed. A time out was performed prior to the initiation of the procedure. Maximal barrier sterile technique utilized including caps, mask, sterile gowns, sterile gloves, large sterile drape, hand hygiene, and Betadine prep. Under sterile conditions and local anesthesia, ultrasound micropuncture access performed of the right common femoral artery. Five French sheath inserted. SOS catheter was utilized to select the celiac origin. Celiac angiogram performed. Celiac: Celiac origin is widely patent. Splenic, common hepatic, gastroduodenal artery, proper hepatic, right and left hepatic arteries are all patent. Irregularity of the hepatic vasculature is secondary to hepatomegaly and diffuse hepatic metastases. No active bleeding demonstrated. On the venous phase, the splenic vein and portal vein are all patent. Through the [place] a 5 French catheter, a Renegade STC microcatheter was advanced over a micro glidewire into the proper hepatic artery. Proper hepatic angiogram: Proper hepatic, right and left hepatic arteries are patent. Vasospasm present in the proximal right hepatic artery related to wire manipulation. Limited assessment of the vasculature because of the contrast injection and poor opacification of the left hepatic artery. Over a double angled Glidewire, the microcatheter was advanced into the left hepatic artery. Left hepatic angiogram  performed. Left hepatic: Left hepatic artery is patent. This is the dominant supply to the left hepatic lobe lateral segment were the percutaneous biopsy was performed. Peripheral irregularity of the arterial vasculature related to hepatic metastases. No active extravasation, acute bleeding, or other acute vascular abnormality including AV fistula or pseudoaneurysm. Because of the significant recent acute perihepatic hemorrhage and a biopsy performed in the left hepatic lobe lateral segment, Gel-Foam embolization of left hepatic artery will be performed. Left hepatic artery Gel-Foam embolization: Through the microcatheter, approximately 5 cc of Gel-Foam slurry was instilled into left hepatic artery. Following embolization, there is significant reduction in the left hepatic arterial peripheral vasculature and sluggish flow but the left hepatic main artery remains patent. Catheter was retracted and advanced into a left hepatic branch supplying the medial segment. Additional left hepatic angiogram performed. Additional left hepatic angiogram: Irregularity of the parenchymal staining related to diffuse hepatic metastases. Scattered peripheral occlusions present in the additional left hepatic branch related to the Gel-Foam embolization. Vasospasm noted at the catheter site. No active bleeding demonstrated. Microcatheter was removed. A final angiogram was performed through the SOS 5 French catheter of the celiac origin. Celiac: Following left hepatic Gel-Foam embolization, there is significant reduction in the arterial vascularity to the the left hepatic lobe where the biopsy was performed. Residual vasospasm present in the proximal left hepatic artery. No other significant acute vascular process. Catheter was retracted and advanced into the SMA. SMA angiogram performed. SMA angiogram: SMA is widely patent. No variant SMA supply demonstrated to the liver. On the venous phase, the superior mesenteric vein and portal vein  all remain patent. Access removed. Hemostasis obtained with manual compression of the right common femoral artery puncture site. Patient tolerated the procedure well. No immediate complication. IMPRESSION: Successful celiac, hepatic, and SMA angiograms as above. Successful micro catheterization, angiograms, and Gel-Foam embolization of the left hepatic artery resulting in significant reduction in arterial flow to the area the liver were the percutaneous biopsy was performed. Electronically Signed   By: Jerilynn Mages.  Shick M.D.   On: 01/19/2017 10:01   US Abdomen Limited Ruq  Result Date: 01/18/2017 CLINICAL DATA:  61 year old female with a history of multiple liver masses, status post percutaneous biopsy today's date. Interval development of abdominal  pain EXAM: US ABDOMEN LIMITED - RIGHT UPPER QUADRANT COMPARISON:  Ultrasound 01/18/2017, MRI 01/15/2017 FINDINGS: Gallbladder: Contracted gallbladder with echogenic focus in the lumen compatible with cholelithiasis. Common bile duct: Diameter: 4 mm -5 mm Liver: Re- demonstration of mass within right liver lobe 7 point far cm by 5.4 cm x 7.4 cm. The biopsy of the left liver lobe on the comparison ultrasound today's date demonstrates small echogenic focus, compatible with hemostatic material. There is hyperechoic free fluid adjacent to the liver margin, both anterior and posterior to the left liver lobe. IMPRESSION: New hyperechoic free fluid adjacent to the left liver lobe, status post percutaneous biopsy, compatible with hemorrhage. Re- demonstration of liver masses, better characterized on prior MRI. These results were called by telephone at the time of interpretation on 01/18/2017 at 5:42 pm to Dr. Annamaria Boots who verbally acknowledged these results. Electronically Signed   By: Corrie Mckusick D.O.   On: 01/18/2017 17:43    Labs:  CBC:  Recent Labs  01/19/17 0631 01/19/17 1137 01/19/17 1830 01/20/17 0511  WBC 32.2* 33.7* 33.9* 32.8*  HGB 8.2* 8.1* 7.6* 6.9*  HCT  27.0* 26.7* 25.6* 22.8*  PLT 770* 712* 683* 584*    COAGS:  Recent Labs  01/18/17 0005 01/18/17 1134  INR 1.27 1.11  APTT 32 36    BMP:  Recent Labs  01/10/17 1358 01/18/17 1614 01/19/17 0639 01/20/17 0511  NA 133* 137 138 138  K 3.8 2.7* 3.5 3.8  CL 93* 87* 94* 95*  CO2 32 34* 31 34*  GLUCOSE 91 146* 144* 111*  BUN 8 5* 7 6  CALCIUM 8.7 8.7* 8.3* 8.8*  CREATININE 0.69 0.86 0.70 0.35*  GFRNONAA  --  >60 >60 >60  GFRAA  --  >60 >60 >60    LIVER FUNCTION TESTS:  Recent Labs  01/10/17 1358 01/18/17 1614 01/19/17 0639 01/20/17 0511  BILITOT 0.2 0.7 0.4 0.3  AST 22 31 50* 56*  ALT 10 12* 15 20  ALKPHOS 149* 186* 148* 138*  PROT 6.9 7.3 6.7 6.3*  ALBUMIN 2.7* 2.0* 1.9* 1.9*    Assessment and Plan:  Liver Mass  S/P Biopsy with subsequent bleed, then S/P Embolization by Dr. Annamaria Boots 01/18/2017  Drop in Hgb again to 6.9  Reviewed with Dr. Annamaria Boots  Will obtain CTA today.  Electronically Signed: Murrell Redden PA-C 01/20/2017, 11:38 AM   I spent a total of 15 Minutes at the the patient's bedside AND on the patient's hospital floor or unit, greater than 50% of which was counseling/coordinating care for f/u after biopsy and embolization

## 2017-01-20 NOTE — Progress Notes (Signed)
Pt oxygen level desaturated 79-80% when getting out of bed to bedside commode with oxygen off.  Pt denied any complaints, lungs diminished throughout.  Oxygen reapplied at 2L with saturations returning to 95-96%.  Pt encouraged to deep breathe.  Will continue to monitor.

## 2017-01-20 NOTE — Progress Notes (Signed)
Pt's HGB 6.9 this am, BP 125/64, hr 101.  [facility] NP paged with result.   Shift end nursing report given to [facility], Therapist, sports.

## 2017-01-20 NOTE — Progress Notes (Signed)
PROGRESS NOTE  Rachel Butler [id number] DOB: [birth year] DOA: 01/18/2017 PCP: Viviana Simpler, MD  HPI/Recap of past 24 hours:  Sitting up in chair, report feeling better, ab pain has improved, no fever, no overt bleeding Husband at bedside  Assessment/Plan: Principal Problem:   Sepsis ([place]) Active Problems:   Nicotine dependence   Lumbar disc disease   Chronic pain   Liver lesion  Sepsis presented on admission with hypotension, fever 101, tachycardia, leukocytosis, lactic acidosis She is admitted to stepdown unit.  cxr + copd but no acute findings, blood culture no growth, ua with concentrated urine but no infection, source of infection likely hepatobiliary She is started on vanc/aztreonam ( h/o pcn allergy) ,she received ivf,  Improving. mrsa screen negative, d/c vanc, continue aztroenam.  Acute perihepatic hemorrhage after left hepatic mass core biopsy: s/p prbc transfusion, s/p S/p visceral angios and left hepatic artery gelfoam embo on 2/23 Due to drop of hgb, CAT agio ab/pel recommended by IR Dr Annamaria Boots on 2/25, CTA neg for acute or active bleeding in the abdomen or pelvis Hold on any additional angio per Ddr Shick IR following  Bleeding in right groin s/p angio Bleed stopped after Pressure held to right groin times 30 minutes , dressing reinforced Close monitor  Acute blood loss anemia: s/p prbc transfusionx2, monitor cbc  Leukocytosis/thrombocytosis: likely reactive. monitor  Hypokalemia: replace k, check mag  Multiple Liver Lesions: high suspicion for a malignancy ([place] vs cholangiocarcinoma)-await bx results. Long hx of tobacco use-did have colonoscopy (2016-Dr Pyrtle)-this was negative.  AFPwnl ,cea mildly elevated at 6.9,  ldh mildly elevated at  276 hepatitis panel negative for hepA/B, hcv ab 0.2, will check hepc viral load, hiv pending Biopsy result pending   Chronic Back Pain with chronic narcotic dependence: follows [facility] pain medicine at [facility]  hold narcotics given hypotension-resume when able  COPD: stable, no wheezing, no cough  Nicotine dependence:will need counseling when more stable.    Diet : resume diet  Code Status: FULL  Family Communication: patient and husband at bedside  Disposition Plan: remain in stepdown for another 24hrs   Consultants:  IR  Procedures:  left hepatic mass core biopsy on 2/23  isceral angios and left hepatic artery gelfoam embo on 2/23 prbc transfusion on 2/23 and on 2/25   Antibiotics:  vanc from admission to 2/25  aztreonam from admission   Objective: BP 125/74   Pulse 94   Temp 98.1 F (36.7 C) (Oral)   Resp 10   Ht 5\' 3"  (1.6 m)   Wt 45.9 kg (101 lb 4.8 oz)   SpO2 99%   BMI 17.94 kg/m   Intake/Output Summary (Last 24 hours) at 01/20/17 0847 Last data filed at 01/20/17 0710  Gross per 24 hour  Intake             1253 ml  Output              400 ml  Net              853 ml   Filed Weights   01/18/17 2043 01/19/17 0500 01/20/17 0443  Weight: 47.6 kg (104 lb 15 oz) 48.2 kg (106 lb 4.8 oz) 45.9 kg (101 lb 4.8 oz)    Exam:   General:  NAD  Cardiovascular: tachycardia has resolved  Respiratory: CTABL  Abdomen: less tender right upper quadrant, no rebound,positive BS  Musculoskeletal: No Edema  Neuro: aaox3, flat affect   Data Reviewed: Basic Metabolic Panel:  Recent Labs Lab 01/18/17 1614 01/19/17 0639 01/20/17 0511  NA 137 138 138  K 2.7* 3.5 3.8  CL 87* 94* 95*  CO2 34* 31 34*  GLUCOSE 146* 144* 111*  BUN 5* 7 6  CREATININE 0.86 0.70 0.35*  CALCIUM 8.7* 8.3* 8.8*  MG  --   --  1.9   Liver Function Tests:  Recent Labs Lab 01/18/17 1614 01/19/17 0639 01/20/17 0511  AST 31 50* 56*  ALT 12* 15 20  ALKPHOS 186* 148* 138*  BILITOT 0.7 0.4 0.3  PROT 7.3 6.7 6.3*  ALBUMIN 2.0* 1.9* 1.9*   No results for input(s): LIPASE, AMYLASE in the last 168 hours. No results for input(s): AMMONIA in the last 168 hours. CBC:  Recent  Labs Lab 01/19/17 0231 01/19/17 0631 01/19/17 1137 01/19/17 1830 01/20/17 0511  WBC 33.7* 32.2* 33.7* 33.9* 32.8*  NEUTROABS  --  27.6*  --   --  28.9*  HGB 8.5* 8.2* 8.1* 7.6* 6.9*  HCT 27.8* 27.0* 26.7* 25.6* 22.8*  MCV 83.5 84.1 84.2 84.8 85.7  PLT 774* 770* 712* 683* 584*   Cardiac Enzymes:    Recent Labs Lab 01/18/17 1558 01/19/17 0639  TROPONINI 0.03* 0.03*   BNP (last 3 results) No results for input(s): BNP in the last 8760 hours.  ProBNP (last 3 results) No results for input(s): PROBNP in the last 8760 hours.  CBG: No results for input(s): GLUCAP in the last 168 hours.  Recent Results (from the past 240 hour(s))  Culture, blood (Routine X 2) w Reflex to ID Panel     Status: None (Preliminary result)   Collection Time: 01/18/17  5:50 PM  Result Value Ref Range Status   Specimen Description BLOOD LEFT ANTECUBITAL  Final   Special Requests BOTTLES DRAWN AEROBIC AND ANAEROBIC 4CC  Final   Culture NO GROWTH < 24 HOURS  Final   Report Status PENDING  Incomplete  Culture, blood (Routine X 2) w Reflex to ID Panel     Status: None (Preliminary result)   Collection Time: 01/18/17  6:12 PM  Result Value Ref Range Status   Specimen Description BLOOD LEFT HAND  Final   Special Requests BOTTLES DRAWN AEROBIC AND ANAEROBIC 3CC  Final   Culture NO GROWTH < 24 HOURS  Final   Report Status PENDING  Incomplete  MRSA PCR Screening     Status: None   Collection Time: 01/18/17  8:50 PM  Result Value Ref Range Status   MRSA by PCR NEGATIVE NEGATIVE Final    Comment:        The GeneXpert MRSA Assay (FDA approved for NASAL specimens only), is one component of a comprehensive MRSA colonization surveillance program. It is not intended to diagnose MRSA infection nor to guide or monitor treatment for MRSA infections.   Culture, Urine     Status: None   Collection Time: 01/19/17  6:25 AM  Result Value Ref Range Status   Specimen Description URINE, RANDOM  Final   Special  Requests NONE  Final   Culture NO GROWTH  Final   Report Status 01/20/2017 FINAL  Final     Studies: No results found.  Scheduled Meds: . sodium chloride   Intravenous Once  . acetaminophen  650 mg Oral Once  . aztreonam  1 g Intravenous Q8H  . diphenhydrAMINE  25 mg Intravenous Once  . fentaNYL  25 mcg Transdermal Q72H  . gabapentin  400 mg Oral TID  . senna-docusate  2 tablet  Oral QHS  . sodium chloride flush  3 mL Intravenous Q12H  . sodium chloride flush  3 mL Intravenous Q12H  . vancomycin  1,000 mg Intravenous Q24H    Continuous Infusions: . sodium chloride 75 mL/hr at 01/19/17 1721     Time spent: 5mins  Eily Louvier MD, PhD  [facility] Pager [phone]. If 7PM-7AM, please contact night-coverage at [email or web address], password [facility] 01/20/2017, 8:47 AM  LOS: 2 days

## 2017-01-20 NOTE — Progress Notes (Signed)
Patient ID: Rachel Butler, female   DOB: 05-25-1956, 61 y.o.   MRN: [id number]   Hgb up to 8.4 post transfusion  CTA neg for acute or active bleeding in the abdomen or pelvis  D/w Dr Erlinda Hong  Hold on any additional angio

## 2017-01-21 ENCOUNTER — Telehealth: Payer: Self-pay | Admitting: Internal Medicine

## 2017-01-21 DIAGNOSIS — T148XXA Other injury of unspecified body region, initial encounter: Secondary | ICD-10-CM

## 2017-01-21 LAB — CBC WITH DIFFERENTIAL/PLATELET
BASOS PCT: 0 %
Basophils Absolute: 0 10*3/uL (ref 0.0–0.1)
EOS PCT: 0 %
Eosinophils Absolute: 0 10*3/uL (ref 0.0–0.7)
HEMATOCRIT: 27.2 % — AB (ref 36.0–46.0)
HEMOGLOBIN: 8.4 g/dL — AB (ref 12.0–15.0)
Lymphocytes Relative: 7 %
Lymphs Abs: 1.9 10*3/uL (ref 0.7–4.0)
MCH: 26.5 pg (ref 26.0–34.0)
MCHC: 30.9 g/dL (ref 30.0–36.0)
MCV: 85.8 fL (ref 78.0–100.0)
MONO ABS: 1.9 10*3/uL — AB (ref 0.1–1.0)
MONOS PCT: 7 %
Neutro Abs: 22.8 10*3/uL — ABNORMAL HIGH (ref 1.7–7.7)
Neutrophils Relative %: 86 %
PLATELETS: 455 10*3/uL — AB (ref 150–400)
RBC: 3.17 MIL/uL — ABNORMAL LOW (ref 3.87–5.11)
RDW: 17.1 % — ABNORMAL HIGH (ref 11.5–15.5)
WBC: 26.6 10*3/uL — ABNORMAL HIGH (ref 4.0–10.5)

## 2017-01-21 LAB — COMPREHENSIVE METABOLIC PANEL
ALK PHOS: 130 U/L — AB (ref 38–126)
ALT: 17 U/L (ref 14–54)
AST: 37 U/L (ref 15–41)
Albumin: 1.7 g/dL — ABNORMAL LOW (ref 3.5–5.0)
Anion gap: 8 (ref 5–15)
BILIRUBIN TOTAL: 0.4 mg/dL (ref 0.3–1.2)
BUN: <5 mg/dL — ABNORMAL LOW (ref 6–20)
CALCIUM: 8.4 mg/dL — AB (ref 8.9–10.3)
CO2: 33 mmol/L — ABNORMAL HIGH (ref 22–32)
CREATININE: 0.37 mg/dL — AB (ref 0.44–1.00)
Chloride: 94 mmol/L — ABNORMAL LOW (ref 101–111)
Glucose, Bld: 88 mg/dL (ref 65–99)
Potassium: 3.5 mmol/L (ref 3.5–5.1)
Sodium: 135 mmol/L (ref 135–145)
TOTAL PROTEIN: 5.8 g/dL — AB (ref 6.5–8.1)

## 2017-01-21 LAB — TYPE AND SCREEN
BLOOD PRODUCT EXPIRATION DATE: 201803092359
Blood Product Expiration Date: 201803012359
ISSUE DATE / TIME: 201802231906
ISSUE DATE / TIME: 201802250658
UNIT TYPE AND RH: 6200
Unit Type and Rh: 6200

## 2017-01-21 LAB — HIV ANTIBODY (ROUTINE TESTING W REFLEX): HIV Screen 4th Generation wRfx: NONREACTIVE

## 2017-01-21 MED ORDER — POTASSIUM CHLORIDE CRYS ER 20 MEQ PO TBCR
40.0000 meq | EXTENDED_RELEASE_TABLET | Freq: Once | ORAL | Status: AC
  Administered 2017-01-21: 40 meq via ORAL
  Filled 2017-01-21: qty 2

## 2017-01-21 MED ORDER — METRONIDAZOLE 500 MG PO TABS
500.0000 mg | ORAL_TABLET | Freq: Three times a day (TID) | ORAL | Status: DC
  Administered 2017-01-21 – 2017-01-23 (×5): 500 mg via ORAL
  Filled 2017-01-21 (×5): qty 1

## 2017-01-21 MED ORDER — CIPROFLOXACIN HCL 500 MG PO TABS
500.0000 mg | ORAL_TABLET | Freq: Two times a day (BID) | ORAL | Status: DC
  Administered 2017-01-21 – 2017-01-23 (×4): 500 mg via ORAL
  Filled 2017-01-21 (×4): qty 1

## 2017-01-21 MED ORDER — POLYETHYLENE GLYCOL 3350 17 G PO PACK
17.0000 g | PACK | Freq: Every day | ORAL | Status: DC
  Administered 2017-01-21 – 2017-01-23 (×3): 17 g via ORAL
  Filled 2017-01-21 (×3): qty 1

## 2017-01-21 NOTE — Progress Notes (Signed)
Pharmacy Antibiotic Note  Rachel Butler is a 61 y.o. female admitted 2/23 with possible sepsis . Patient continues on Aztreonam.  Vancomycin was discontinued with negative MRSA PCR swab.  WBC improving.  Cx remain negative.  Plan: -Aztreonam 1gm IV q8h -Will follow renal function, cultures and clinical progress   Height: 5\' 3"  (160 cm) Weight: 104 lb 6.4 oz (47.4 kg) IBW/kg (Calculated) : 52.4  Temp (24hrs), Avg:99.1 F (37.3 C), Min:98.3 F (36.8 C), Max:100.5 F (38.1 C)   Recent Labs Lab 01/18/17 1614 01/18/17 1750 01/18/17 2351  01/19/17 0631 01/19/17 0639 01/19/17 1137 01/19/17 1830 01/20/17 0511 01/21/17 0352  WBC  --   --  31.0*  < > 32.2*  --  33.7* 33.9* 32.8* 26.6*  CREATININE 0.86  --   --   --   --  0.70  --   --  0.35* 0.37*  LATICACIDVEN  --  2.7* 1.8  --   --   --   --   --   --   --   < > = values in this interval not displayed.  Estimated Creatinine Clearance: 57.4 mL/min (by C-G formula based on SCr of 0.37 mg/dL (L)).    Allergies  Allergen Reactions  . Endoxcin [Lidocaine-Menthol]   . Nubain [Nalbuphine Hcl]   . Penicillins   . Robaxin [Methocarbamol]     Antimicrobials this admission: Azactam 2/23 >> Vancomycin 2/23 >> 2/25   Dose adjustments this admission:   Microbiology results: 2/24 blood x2 - sent 2/23 blood x2 - ngtd 2/24 urine - neg 2/23 MRSA neg   Thank you for allowing pharmacy to be a part of this patient's care.  [employer], Pharm.D., BCPS Clinical Pharmacist Pager [phone] 01/21/2017 1:20 PM

## 2017-01-21 NOTE — Telephone Encounter (Signed)
Pts husband stopped by to give an update on his wife and ask some questions.  Can you please call him when you get a chance.  At the number below. [phone]

## 2017-01-21 NOTE — Evaluation (Signed)
Physical Therapy Evaluation Patient Details Name: Rachel Butler MRN: [id number] DOB: 12/23/1955 Today's Date: 01/21/2017   History of Present Illness  Pt is a 61 y.o. female admitted following outpatient liver biopsy as she developed fever, tachycardia, hypotension and worsening RUQ pain. PMH includes DDD and chronic pain syndrome. MRI positive for multifocal heterogeneous enhancing liver lesions worrisome for diffuse hepatic metastasis. CT shows aortoiliac atherosclerosis, cholelithiasis, and pelvic ascites. Pt also with acute periphatic hemorrhage after L hepatic mass core biopsy with L hepatic artery gelfoam embo on 2/23; and R groin bleed s/p angio.    Clinical Impression  Pt presents with generalized weakness, decreased activity tolerance, and impaired balance secondary to above. PTA, pt required supervision for IADLs and amb of short community distances with [facility]; pt lives at home with husband who provides 24/7 supervision. Today, pt amb in hallway with RW and min guard, balance improved with RW compared to [facility]; with amb, SpO2 85-92% on RA and HR up to 128. Pt high risk for falls based on gait speed and history. Educ on use of RW, pursed lip breathing, and importance of mobility. Pt would benefit from continued acute PT services to maximize functional mobility and independence.     Follow Up Recommendations Home health PT    Equipment Recommendations  Rolling walker with 5" wheels    Recommendations for Other Services       Precautions / Restrictions Precautions Precautions: Fall Precaution Comments: Watch BP and O2 Restrictions Weight Bearing Restrictions: No      Mobility  Bed Mobility               General bed mobility comments: Pt received seated in chair  Transfers Overall transfer level: Needs assistance Equipment used: Straight cane Transfers: Sit to/from Stand Sit to Stand: Min guard            Ambulation/Gait Ambulation/Gait assistance: Min  assist Ambulation Distance (Feet): 80 Feet Assistive device: Standard walker Gait Pattern/deviations: Step-through pattern;Decreased step length - right;Decreased step length - left;Decreased stride length Gait velocity: Decreased velocity of 0.625 ft/sec Gait velocity interpretation: Below normal speed for age/gender (0.625 ft/sec) General Gait Details: Began amb in room with SPC with increased instability and pt reaching out with other hand for furniture support; switched to amb with RW which pt reported felt more stable.   Stairs            Wheelchair Mobility    Modified Rankin (Stroke Patients Only)       Balance Overall balance assessment: Needs assistance;History of Falls Sitting-balance support: No upper extremity supported;Feet supported Sitting balance-Leahy Scale: Fair     Standing balance support: Single extremity supported;During functional activity Standing balance-Leahy Scale: Poor                               Pertinent Vitals/Pain Pain Assessment: 0-10 Pain Score: 3  Pain Location: Stomach Pain Descriptors / Indicators: Pressure Pain Intervention(s): Limited activity within patient's tolerance;Repositioned;Premedicated before session    Home Living Family/patient expects to be discharged to:: Private residence Living Arrangements: Spouse/significant other Available Help at Discharge: Available 24 hours/day;Family Type of Home: House Home Access: Stairs to enter Entrance Stairs-Rails: Can reach both Entrance Stairs-Number of Steps: 3 Home Layout: Two level (Stair lift) Home Equipment: Cane - single point      Prior Function Level of Independence: Needs assistance   Gait / Transfers Assistance Needed: Supervision for mobility with cane  and handheld assist from husband.   ADL's / Homemaking Assistance Needed: Supervision for IADLs.         Hand Dominance   Dominant Hand: Right    Extremity/Trunk Assessment   Upper Extremity  Assessment Upper Extremity Assessment: Generalized weakness    Lower Extremity Assessment Lower Extremity Assessment: Generalized weakness       Communication   Communication: No difficulties  Cognition Arousal/Alertness: Awake/alert Behavior During Therapy: WFL for tasks assessed/performed Overall Cognitive Status: Within Functional Limits for tasks assessed                      General Comments General comments (skin integrity, edema, etc.): At rest: HR 107, BP 137/80, 97% O2 on RA. With standing BP 106/67, increased to 120/70s with arm pumping; pt asymptomatic. With amb up to 128 and O2 85-92% on RA; pt asymptomatic.     Exercises     Assessment/Plan    PT Assessment Patient needs continued PT services  PT Problem List Decreased strength;Decreased mobility;Decreased activity tolerance;Decreased balance;Cardiopulmonary status limiting activity;Decreased knowledge of use of DME       PT Treatment Interventions Gait training;DME instruction;Stair training;Functional mobility training;Therapeutic activities;Therapeutic exercise;Balance training;Patient/family education    PT Goals (Current goals can be found in the Care Plan section)  Acute Rehab PT Goals Patient Stated Goal: Return home PT Goal Formulation: With patient Time For Goal Achievement: 02/04/17 Potential to Achieve Goals: Good    Frequency Min 3X/week   Barriers to discharge        Co-evaluation               End of Session Equipment Utilized During Treatment: Gait belt Activity Tolerance: Patient tolerated treatment well Patient left: in chair;with family/visitor present;with call bell/phone within reach Nurse Communication: Mobility status PT Visit Diagnosis: Unsteadiness on feet (R26.81);Muscle weakness (generalized) (M62.81)         Time: [id number] PT Time Calculation (min) (ACUTE ONLY): 24 min   Charges:   PT Evaluation $PT Eval Moderate Complexity: 1 Procedure PT  Treatments $Gait Training: 8-22 mins   PT G Codes:       Enis Gash, SPT Office-[phone]  Mabeline Caras 01/21/2017, 4:01 PM

## 2017-01-21 NOTE — Progress Notes (Signed)
Pt had scratch on bottom, it is now developing into a stage II ulcer. RN has consulted [facility], and has encouraged pt to turn herself off of her bottom, RN also encouraged patient to sleep in the bed tonight so that she can relieve her bottom of pressure. RN will pass on to next shift

## 2017-01-21 NOTE — Progress Notes (Signed)
Patient ID: Rachel Butler, female   DOB: 08/19/1956, 61 y.o.   MRN: [id number]    Referring Physician(s): Letvak,Richard I  Supervising Physician: Arne Cleveland  Patient Status: [facility] - In-pt  Chief Complaint: Liver biopsy with post bx hemorrhage  Subjective: Patient is feeling better today.  Less abdominal pain.  Allergies: Endoxcin [lidocaine-menthol]; Nubain [nalbuphine hcl]; Penicillins; and Robaxin [methocarbamol]  Medications: Prior to Admission medications   Medication Sig Start Date End Date Taking? Authorizing Provider  Aspirin-Salicylamide-Caffeine (BC HEADACHE POWDER PO) Take 1 Package by mouth 2 (two) times daily. Pain   Yes Historical Provider, MD  BIOTIN 5000 PO Take by mouth daily.   Yes Historical Provider, MD  fentaNYL (DURAGESIC - DOSED MCG/HR) 25 MCG/HR patch Place 25 mcg onto the skin every 3 (three) days.  12/13/14  Yes Historical Provider, MD  furosemide (LASIX) 40 MG tablet TAKE 1 TABLET BY MOUTH ONCE A DAY 12/20/16  Yes Venia Carbon, MD  gabapentin (NEURONTIN) 800 MG tablet Take 1 tablet (800 mg total) by mouth 3 (three) times daily. 01/09/17  Yes Venia Carbon, MD  HYDROcodone-acetaminophen (NORCO/VICODIN) 5-325 MG per tablet Take 1 tablet by mouth 3 (three) times daily as needed for pain.   Yes Historical Provider, MD  LORazepam (ATIVAN) 0.5 MG tablet 1-2 tablets 1 hour before MRI. May repeat as needed. 01/14/17  Yes Venia Carbon, MD  Multiple Vitamins-Minerals (MULTIVITAMIN WITH MINERALS) tablet Take 1 tablet by mouth daily.   Yes Historical Provider, MD  promethazine (PHENERGAN) 25 MG tablet TAKE 0.5-1 TABLETS (12.5-25 MG TOTAL) BY MOUTH 3 (THREE) TIMES DAILY AS NEEDED FOR NAUSEA. 06/28/16  Yes Venia Carbon, MD  sennosides-docusate sodium (SENOKOT-S) 8.6-50 MG tablet Take 2 tablets by mouth daily.   Yes Historical Provider, MD    Vital Signs: BP 124/74   Pulse 87   Temp 98.4 F (36.9 C) (Oral)   Resp 15   Ht 5\' 3"  (1.6 m)   Wt 104 lb 6.4  oz (47.4 kg)   SpO2 99%   BMI 18.49 kg/m   Physical Exam: Abd: soft, feels a bit distended, hypoactive BS, both biopsy site and right inguinal stick site are c/d/i with no evidence of bleeding or infection.  Imaging: Ir Angiogram Visceral Selective  Result Date: 01/19/2017 INDICATION: Acute abdominal pain with acute perihepatic hemorrhage 2 hours status post percutaneous left hepatic metastasis biopsy. EXAM: SELECTIVE VISCERAL ARTERIOGRAPHY; IR EMBO ART VEN HEMORR LYMPH EXTRAV INC GUIDE ROADMAPPING; ADDITIONAL ARTERIOGRAPHY; IR ULTRASOUND GUIDANCE VASC ACCESS RIGHT MEDICATIONS: 1% lidocaine locally. ANESTHESIA/SEDATION: Moderate (conscious) sedation was employed during this procedure. A total of Versed 1.0 mg and Fentanyl 25 mcg was administered intravenously. Moderate Sedation Time: 50 minutes. The patient's level of consciousness and vital signs were monitored continuously by radiology nursing throughout the procedure under my direct supervision. CONTRAST:  1 ISOVUE-300 IOPAMIDOL (ISOVUE-300) INJECTION 61%, 1 ISOVUE-300 IOPAMIDOL (ISOVUE-300) INJECTION 61% FLUOROSCOPY TIME:  Fluoroscopy Time: 14 minutes 12 seconds (144 mGy). COMPLICATIONS: None immediate. PROCEDURE: Informed consent was obtained from the patient following explanation of the procedure, risks, benefits and alternatives. The patient understands, agrees and consents for the procedure. All questions were addressed. A time out was performed prior to the initiation of the procedure. Maximal barrier sterile technique utilized including caps, mask, sterile gowns, sterile gloves, large sterile drape, hand hygiene, and Betadine prep. Under sterile conditions and local anesthesia, ultrasound micropuncture access performed of the right common femoral artery. Five French sheath inserted. SOS catheter was utilized to  select the celiac origin. Celiac angiogram performed. Celiac: Celiac origin is widely patent. Splenic, common hepatic, gastroduodenal  artery, proper hepatic, right and left hepatic arteries are all patent. Irregularity of the hepatic vasculature is secondary to hepatomegaly and diffuse hepatic metastases. No active bleeding demonstrated. On the venous phase, the splenic vein and portal vein are all patent. Through the [place] a 5 French catheter, a Renegade STC microcatheter was advanced over a micro glidewire into the proper hepatic artery. Proper hepatic angiogram: Proper hepatic, right and left hepatic arteries are patent. Vasospasm present in the proximal right hepatic artery related to wire manipulation. Limited assessment of the vasculature because of the contrast injection and poor opacification of the left hepatic artery. Over a double angled Glidewire, the microcatheter was advanced into the left hepatic artery. Left hepatic angiogram performed. Left hepatic: Left hepatic artery is patent. This is the dominant supply to the left hepatic lobe lateral segment were the percutaneous biopsy was performed. Peripheral irregularity of the arterial vasculature related to hepatic metastases. No active extravasation, acute bleeding, or other acute vascular abnormality including AV fistula or pseudoaneurysm. Because of the significant recent acute perihepatic hemorrhage and a biopsy performed in the left hepatic lobe lateral segment, Gel-Foam embolization of left hepatic artery will be performed. Left hepatic artery Gel-Foam embolization: Through the microcatheter, approximately 5 cc of Gel-Foam slurry was instilled into left hepatic artery. Following embolization, there is significant reduction in the left hepatic arterial peripheral vasculature and sluggish flow but the left hepatic main artery remains patent. Catheter was retracted and advanced into a left hepatic branch supplying the medial segment. Additional left hepatic angiogram performed. Additional left hepatic angiogram: Irregularity of the parenchymal staining related to diffuse hepatic  metastases. Scattered peripheral occlusions present in the additional left hepatic branch related to the Gel-Foam embolization. Vasospasm noted at the catheter site. No active bleeding demonstrated. Microcatheter was removed. A final angiogram was performed through the SOS 5 French catheter of the celiac origin. Celiac: Following left hepatic Gel-Foam embolization, there is significant reduction in the arterial vascularity to the the left hepatic lobe where the biopsy was performed. Residual vasospasm present in the proximal left hepatic artery. No other significant acute vascular process. Catheter was retracted and advanced into the SMA. SMA angiogram performed. SMA angiogram: SMA is widely patent. No variant SMA supply demonstrated to the liver. On the venous phase, the superior mesenteric vein and portal vein all remain patent. Access removed. Hemostasis obtained with manual compression of the right common femoral artery puncture site. Patient tolerated the procedure well. No immediate complication. IMPRESSION: Successful celiac, hepatic, and SMA angiograms as above. Successful micro catheterization, angiograms, and Gel-Foam embolization of the left hepatic artery resulting in significant reduction in arterial flow to the area the liver were the percutaneous biopsy was performed. Electronically Signed   By: Jerilynn Mages.  Shick M.D.   On: 01/19/2017 10:01   Ir Angiogram Visceral Selective  Result Date: 01/19/2017 INDICATION: Acute abdominal pain with acute perihepatic hemorrhage 2 hours status post percutaneous left hepatic metastasis biopsy. EXAM: SELECTIVE VISCERAL ARTERIOGRAPHY; IR EMBO ART VEN HEMORR LYMPH EXTRAV INC GUIDE ROADMAPPING; ADDITIONAL ARTERIOGRAPHY; IR ULTRASOUND GUIDANCE VASC ACCESS RIGHT MEDICATIONS: 1% lidocaine locally. ANESTHESIA/SEDATION: Moderate (conscious) sedation was employed during this procedure. A total of Versed 1.0 mg and Fentanyl 25 mcg was administered intravenously. Moderate Sedation  Time: 50 minutes. The patient's level of consciousness and vital signs were monitored continuously by radiology nursing throughout the procedure under my direct supervision. CONTRAST:  1  ISOVUE-300 IOPAMIDOL (ISOVUE-300) INJECTION 61%, 1 ISOVUE-300 IOPAMIDOL (ISOVUE-300) INJECTION 61% FLUOROSCOPY TIME:  Fluoroscopy Time: 14 minutes 12 seconds (144 mGy). COMPLICATIONS: None immediate. PROCEDURE: Informed consent was obtained from the patient following explanation of the procedure, risks, benefits and alternatives. The patient understands, agrees and consents for the procedure. All questions were addressed. A time out was performed prior to the initiation of the procedure. Maximal barrier sterile technique utilized including caps, mask, sterile gowns, sterile gloves, large sterile drape, hand hygiene, and Betadine prep. Under sterile conditions and local anesthesia, ultrasound micropuncture access performed of the right common femoral artery. Five French sheath inserted. SOS catheter was utilized to select the celiac origin. Celiac angiogram performed. Celiac: Celiac origin is widely patent. Splenic, common hepatic, gastroduodenal artery, proper hepatic, right and left hepatic arteries are all patent. Irregularity of the hepatic vasculature is secondary to hepatomegaly and diffuse hepatic metastases. No active bleeding demonstrated. On the venous phase, the splenic vein and portal vein are all patent. Through the [place] a 5 French catheter, a Renegade STC microcatheter was advanced over a micro glidewire into the proper hepatic artery. Proper hepatic angiogram: Proper hepatic, right and left hepatic arteries are patent. Vasospasm present in the proximal right hepatic artery related to wire manipulation. Limited assessment of the vasculature because of the contrast injection and poor opacification of the left hepatic artery. Over a double angled Glidewire, the microcatheter was advanced into the left hepatic artery. Left  hepatic angiogram performed. Left hepatic: Left hepatic artery is patent. This is the dominant supply to the left hepatic lobe lateral segment were the percutaneous biopsy was performed. Peripheral irregularity of the arterial vasculature related to hepatic metastases. No active extravasation, acute bleeding, or other acute vascular abnormality including AV fistula or pseudoaneurysm. Because of the significant recent acute perihepatic hemorrhage and a biopsy performed in the left hepatic lobe lateral segment, Gel-Foam embolization of left hepatic artery will be performed. Left hepatic artery Gel-Foam embolization: Through the microcatheter, approximately 5 cc of Gel-Foam slurry was instilled into left hepatic artery. Following embolization, there is significant reduction in the left hepatic arterial peripheral vasculature and sluggish flow but the left hepatic main artery remains patent. Catheter was retracted and advanced into a left hepatic branch supplying the medial segment. Additional left hepatic angiogram performed. Additional left hepatic angiogram: Irregularity of the parenchymal staining related to diffuse hepatic metastases. Scattered peripheral occlusions present in the additional left hepatic branch related to the Gel-Foam embolization. Vasospasm noted at the catheter site. No active bleeding demonstrated. Microcatheter was removed. A final angiogram was performed through the SOS 5 French catheter of the celiac origin. Celiac: Following left hepatic Gel-Foam embolization, there is significant reduction in the arterial vascularity to the the left hepatic lobe where the biopsy was performed. Residual vasospasm present in the proximal left hepatic artery. No other significant acute vascular process. Catheter was retracted and advanced into the SMA. SMA angiogram performed. SMA angiogram: SMA is widely patent. No variant SMA supply demonstrated to the liver. On the venous phase, the superior mesenteric vein  and portal vein all remain patent. Access removed. Hemostasis obtained with manual compression of the right common femoral artery puncture site. Patient tolerated the procedure well. No immediate complication. IMPRESSION: Successful celiac, hepatic, and SMA angiograms as above. Successful micro catheterization, angiograms, and Gel-Foam embolization of the left hepatic artery resulting in significant reduction in arterial flow to the area the liver were the percutaneous biopsy was performed. Electronically Signed   By: Jerilynn Mages.  Shick M.D.   On: 01/19/2017 10:01   Ir Angiogram Selective Each Additional Vessel  Result Date: 01/19/2017 INDICATION: Acute abdominal pain with acute perihepatic hemorrhage 2 hours status post percutaneous left hepatic metastasis biopsy. EXAM: SELECTIVE VISCERAL ARTERIOGRAPHY; IR EMBO ART VEN HEMORR LYMPH EXTRAV INC GUIDE ROADMAPPING; ADDITIONAL ARTERIOGRAPHY; IR ULTRASOUND GUIDANCE VASC ACCESS RIGHT MEDICATIONS: 1% lidocaine locally. ANESTHESIA/SEDATION: Moderate (conscious) sedation was employed during this procedure. A total of Versed 1.0 mg and Fentanyl 25 mcg was administered intravenously. Moderate Sedation Time: 50 minutes. The patient's level of consciousness and vital signs were monitored continuously by radiology nursing throughout the procedure under my direct supervision. CONTRAST:  1 ISOVUE-300 IOPAMIDOL (ISOVUE-300) INJECTION 61%, 1 ISOVUE-300 IOPAMIDOL (ISOVUE-300) INJECTION 61% FLUOROSCOPY TIME:  Fluoroscopy Time: 14 minutes 12 seconds (144 mGy). COMPLICATIONS: None immediate. PROCEDURE: Informed consent was obtained from the patient following explanation of the procedure, risks, benefits and alternatives. The patient understands, agrees and consents for the procedure. All questions were addressed. A time out was performed prior to the initiation of the procedure. Maximal barrier sterile technique utilized including caps, mask, sterile gowns, sterile gloves, large sterile  drape, hand hygiene, and Betadine prep. Under sterile conditions and local anesthesia, ultrasound micropuncture access performed of the right common femoral artery. Five French sheath inserted. SOS catheter was utilized to select the celiac origin. Celiac angiogram performed. Celiac: Celiac origin is widely patent. Splenic, common hepatic, gastroduodenal artery, proper hepatic, right and left hepatic arteries are all patent. Irregularity of the hepatic vasculature is secondary to hepatomegaly and diffuse hepatic metastases. No active bleeding demonstrated. On the venous phase, the splenic vein and portal vein are all patent. Through the [place] a 5 French catheter, a Renegade STC microcatheter was advanced over a micro glidewire into the proper hepatic artery. Proper hepatic angiogram: Proper hepatic, right and left hepatic arteries are patent. Vasospasm present in the proximal right hepatic artery related to wire manipulation. Limited assessment of the vasculature because of the contrast injection and poor opacification of the left hepatic artery. Over a double angled Glidewire, the microcatheter was advanced into the left hepatic artery. Left hepatic angiogram performed. Left hepatic: Left hepatic artery is patent. This is the dominant supply to the left hepatic lobe lateral segment were the percutaneous biopsy was performed. Peripheral irregularity of the arterial vasculature related to hepatic metastases. No active extravasation, acute bleeding, or other acute vascular abnormality including AV fistula or pseudoaneurysm. Because of the significant recent acute perihepatic hemorrhage and a biopsy performed in the left hepatic lobe lateral segment, Gel-Foam embolization of left hepatic artery will be performed. Left hepatic artery Gel-Foam embolization: Through the microcatheter, approximately 5 cc of Gel-Foam slurry was instilled into left hepatic artery. Following embolization, there is significant reduction in the  left hepatic arterial peripheral vasculature and sluggish flow but the left hepatic main artery remains patent. Catheter was retracted and advanced into a left hepatic branch supplying the medial segment. Additional left hepatic angiogram performed. Additional left hepatic angiogram: Irregularity of the parenchymal staining related to diffuse hepatic metastases. Scattered peripheral occlusions present in the additional left hepatic branch related to the Gel-Foam embolization. Vasospasm noted at the catheter site. No active bleeding demonstrated. Microcatheter was removed. A final angiogram was performed through the SOS 5 French catheter of the celiac origin. Celiac: Following left hepatic Gel-Foam embolization, there is significant reduction in the arterial vascularity to the the left hepatic lobe where the biopsy was performed. Residual vasospasm present in the proximal left hepatic artery. No  other significant acute vascular process. Catheter was retracted and advanced into the SMA. SMA angiogram performed. SMA angiogram: SMA is widely patent. No variant SMA supply demonstrated to the liver. On the venous phase, the superior mesenteric vein and portal vein all remain patent. Access removed. Hemostasis obtained with manual compression of the right common femoral artery puncture site. Patient tolerated the procedure well. No immediate complication. IMPRESSION: Successful celiac, hepatic, and SMA angiograms as above. Successful micro catheterization, angiograms, and Gel-Foam embolization of the left hepatic artery resulting in significant reduction in arterial flow to the area the liver were the percutaneous biopsy was performed. Electronically Signed   By: Jerilynn Mages.  Shick M.D.   On: 01/19/2017 10:01   Ir Angiogram Selective Each Additional Vessel  Result Date: 01/19/2017 INDICATION: Acute abdominal pain with acute perihepatic hemorrhage 2 hours status post percutaneous left hepatic metastasis biopsy. EXAM: SELECTIVE  VISCERAL ARTERIOGRAPHY; IR EMBO ART VEN HEMORR LYMPH EXTRAV INC GUIDE ROADMAPPING; ADDITIONAL ARTERIOGRAPHY; IR ULTRASOUND GUIDANCE VASC ACCESS RIGHT MEDICATIONS: 1% lidocaine locally. ANESTHESIA/SEDATION: Moderate (conscious) sedation was employed during this procedure. A total of Versed 1.0 mg and Fentanyl 25 mcg was administered intravenously. Moderate Sedation Time: 50 minutes. The patient's level of consciousness and vital signs were monitored continuously by radiology nursing throughout the procedure under my direct supervision. CONTRAST:  1 ISOVUE-300 IOPAMIDOL (ISOVUE-300) INJECTION 61%, 1 ISOVUE-300 IOPAMIDOL (ISOVUE-300) INJECTION 61% FLUOROSCOPY TIME:  Fluoroscopy Time: 14 minutes 12 seconds (144 mGy). COMPLICATIONS: None immediate. PROCEDURE: Informed consent was obtained from the patient following explanation of the procedure, risks, benefits and alternatives. The patient understands, agrees and consents for the procedure. All questions were addressed. A time out was performed prior to the initiation of the procedure. Maximal barrier sterile technique utilized including caps, mask, sterile gowns, sterile gloves, large sterile drape, hand hygiene, and Betadine prep. Under sterile conditions and local anesthesia, ultrasound micropuncture access performed of the right common femoral artery. Five French sheath inserted. SOS catheter was utilized to select the celiac origin. Celiac angiogram performed. Celiac: Celiac origin is widely patent. Splenic, common hepatic, gastroduodenal artery, proper hepatic, right and left hepatic arteries are all patent. Irregularity of the hepatic vasculature is secondary to hepatomegaly and diffuse hepatic metastases. No active bleeding demonstrated. On the venous phase, the splenic vein and portal vein are all patent. Through the [place] a 5 French catheter, a Renegade STC microcatheter was advanced over a micro glidewire into the proper hepatic artery. Proper hepatic angiogram:  Proper hepatic, right and left hepatic arteries are patent. Vasospasm present in the proximal right hepatic artery related to wire manipulation. Limited assessment of the vasculature because of the contrast injection and poor opacification of the left hepatic artery. Over a double angled Glidewire, the microcatheter was advanced into the left hepatic artery. Left hepatic angiogram performed. Left hepatic: Left hepatic artery is patent. This is the dominant supply to the left hepatic lobe lateral segment were the percutaneous biopsy was performed. Peripheral irregularity of the arterial vasculature related to hepatic metastases. No active extravasation, acute bleeding, or other acute vascular abnormality including AV fistula or pseudoaneurysm. Because of the significant recent acute perihepatic hemorrhage and a biopsy performed in the left hepatic lobe lateral segment, Gel-Foam embolization of left hepatic artery will be performed. Left hepatic artery Gel-Foam embolization: Through the microcatheter, approximately 5 cc of Gel-Foam slurry was instilled into left hepatic artery. Following embolization, there is significant reduction in the left hepatic arterial peripheral vasculature and sluggish flow but the left hepatic main artery  remains patent. Catheter was retracted and advanced into a left hepatic branch supplying the medial segment. Additional left hepatic angiogram performed. Additional left hepatic angiogram: Irregularity of the parenchymal staining related to diffuse hepatic metastases. Scattered peripheral occlusions present in the additional left hepatic branch related to the Gel-Foam embolization. Vasospasm noted at the catheter site. No active bleeding demonstrated. Microcatheter was removed. A final angiogram was performed through the SOS 5 French catheter of the celiac origin. Celiac: Following left hepatic Gel-Foam embolization, there is significant reduction in the arterial vascularity to the the  left hepatic lobe where the biopsy was performed. Residual vasospasm present in the proximal left hepatic artery. No other significant acute vascular process. Catheter was retracted and advanced into the SMA. SMA angiogram performed. SMA angiogram: SMA is widely patent. No variant SMA supply demonstrated to the liver. On the venous phase, the superior mesenteric vein and portal vein all remain patent. Access removed. Hemostasis obtained with manual compression of the right common femoral artery puncture site. Patient tolerated the procedure well. No immediate complication. IMPRESSION: Successful celiac, hepatic, and SMA angiograms as above. Successful micro catheterization, angiograms, and Gel-Foam embolization of the left hepatic artery resulting in significant reduction in arterial flow to the area the liver were the percutaneous biopsy was performed. Electronically Signed   By: Jerilynn Mages.  Shick M.D.   On: 01/19/2017 10:01   Ir Angiogram Selective Each Additional Vessel  Result Date: 01/19/2017 INDICATION: Acute abdominal pain with acute perihepatic hemorrhage 2 hours status post percutaneous left hepatic metastasis biopsy. EXAM: SELECTIVE VISCERAL ARTERIOGRAPHY; IR EMBO ART VEN HEMORR LYMPH EXTRAV INC GUIDE ROADMAPPING; ADDITIONAL ARTERIOGRAPHY; IR ULTRASOUND GUIDANCE VASC ACCESS RIGHT MEDICATIONS: 1% lidocaine locally. ANESTHESIA/SEDATION: Moderate (conscious) sedation was employed during this procedure. A total of Versed 1.0 mg and Fentanyl 25 mcg was administered intravenously. Moderate Sedation Time: 50 minutes. The patient's level of consciousness and vital signs were monitored continuously by radiology nursing throughout the procedure under my direct supervision. CONTRAST:  1 ISOVUE-300 IOPAMIDOL (ISOVUE-300) INJECTION 61%, 1 ISOVUE-300 IOPAMIDOL (ISOVUE-300) INJECTION 61% FLUOROSCOPY TIME:  Fluoroscopy Time: 14 minutes 12 seconds (144 mGy). COMPLICATIONS: None immediate. PROCEDURE: Informed consent was  obtained from the patient following explanation of the procedure, risks, benefits and alternatives. The patient understands, agrees and consents for the procedure. All questions were addressed. A time out was performed prior to the initiation of the procedure. Maximal barrier sterile technique utilized including caps, mask, sterile gowns, sterile gloves, large sterile drape, hand hygiene, and Betadine prep. Under sterile conditions and local anesthesia, ultrasound micropuncture access performed of the right common femoral artery. Five French sheath inserted. SOS catheter was utilized to select the celiac origin. Celiac angiogram performed. Celiac: Celiac origin is widely patent. Splenic, common hepatic, gastroduodenal artery, proper hepatic, right and left hepatic arteries are all patent. Irregularity of the hepatic vasculature is secondary to hepatomegaly and diffuse hepatic metastases. No active bleeding demonstrated. On the venous phase, the splenic vein and portal vein are all patent. Through the [place] a 5 French catheter, a Renegade STC microcatheter was advanced over a micro glidewire into the proper hepatic artery. Proper hepatic angiogram: Proper hepatic, right and left hepatic arteries are patent. Vasospasm present in the proximal right hepatic artery related to wire manipulation. Limited assessment of the vasculature because of the contrast injection and poor opacification of the left hepatic artery. Over a double angled Glidewire, the microcatheter was advanced into the left hepatic artery. Left hepatic angiogram performed. Left hepatic: Left hepatic artery is patent.  This is the dominant supply to the left hepatic lobe lateral segment were the percutaneous biopsy was performed. Peripheral irregularity of the arterial vasculature related to hepatic metastases. No active extravasation, acute bleeding, or other acute vascular abnormality including AV fistula or pseudoaneurysm. Because of the significant  recent acute perihepatic hemorrhage and a biopsy performed in the left hepatic lobe lateral segment, Gel-Foam embolization of left hepatic artery will be performed. Left hepatic artery Gel-Foam embolization: Through the microcatheter, approximately 5 cc of Gel-Foam slurry was instilled into left hepatic artery. Following embolization, there is significant reduction in the left hepatic arterial peripheral vasculature and sluggish flow but the left hepatic main artery remains patent. Catheter was retracted and advanced into a left hepatic branch supplying the medial segment. Additional left hepatic angiogram performed. Additional left hepatic angiogram: Irregularity of the parenchymal staining related to diffuse hepatic metastases. Scattered peripheral occlusions present in the additional left hepatic branch related to the Gel-Foam embolization. Vasospasm noted at the catheter site. No active bleeding demonstrated. Microcatheter was removed. A final angiogram was performed through the SOS 5 French catheter of the celiac origin. Celiac: Following left hepatic Gel-Foam embolization, there is significant reduction in the arterial vascularity to the the left hepatic lobe where the biopsy was performed. Residual vasospasm present in the proximal left hepatic artery. No other significant acute vascular process. Catheter was retracted and advanced into the SMA. SMA angiogram performed. SMA angiogram: SMA is widely patent. No variant SMA supply demonstrated to the liver. On the venous phase, the superior mesenteric vein and portal vein all remain patent. Access removed. Hemostasis obtained with manual compression of the right common femoral artery puncture site. Patient tolerated the procedure well. No immediate complication. IMPRESSION: Successful celiac, hepatic, and SMA angiograms as above. Successful micro catheterization, angiograms, and Gel-Foam embolization of the left hepatic artery resulting in significant reduction  in arterial flow to the area the liver were the percutaneous biopsy was performed. Electronically Signed   By: Jerilynn Mages.  Shick M.D.   On: 01/19/2017 10:01   US Biopsy  Result Date: 01/18/2017 INDICATION: LIVER METASTASES, UNKNOWN PRIMARY. EXAM: ULTRASOUND BIOPSY CORE LIVER MEDICATIONS: 1% LIDOCAINE ANESTHESIA/SEDATION: Moderate (conscious) sedation was employed during this procedure. A total of Versed 1.0 mg and Fentanyl 50 mcg was administered intravenously. Moderate Sedation Time: 10 minutes. The patient's level of consciousness and vital signs were monitored continuously by radiology nursing throughout the procedure under my direct supervision. FLUOROSCOPY TIME:  Fluoroscopy Time: NONE. COMPLICATIONS: None immediate. PROCEDURE: Informed written consent was obtained from the patient after a thorough discussion of the procedural risks, benefits and alternatives. All questions were addressed. Maximal Sterile Barrier Technique was utilized including caps, mask, sterile gowns, sterile gloves, sterile drape, hand hygiene and skin antiseptic. A timeout was performed prior to the initiation of the procedure. Previous imaging reviewed. Preliminary ultrasound performed. A solid left hepatic lobe lesion was localized from a subxiphoid window. This was correlated with the abdominal MRI. Overlying skin was marked. Under sterile conditions and local anesthesia, a 17 gauge 6.8 cm access needle was advanced from an anterior subxiphoid approach into the left hepatic mass. Needle position confirmed with ultrasound. Three 1 cm 18 gauge core biopsies obtained. Samples placed in formalin. These were intact and non fragmented. Needle tract embolized with Gel-Foam. Postprocedure imaging demonstrates no hemorrhage or hematoma. Patient tolerated the biopsy well. IMPRESSION: Successful ultrasound left hepatic mass 18 gauge core biopsy Electronically Signed   By: Jerilynn Mages.  Shick M.D.   On: 01/18/2017 14:53  Ir US Guide Vasc Access  Right  Result Date: 01/19/2017 INDICATION: Acute abdominal pain with acute perihepatic hemorrhage 2 hours status post percutaneous left hepatic metastasis biopsy. EXAM: SELECTIVE VISCERAL ARTERIOGRAPHY; IR EMBO ART VEN HEMORR LYMPH EXTRAV INC GUIDE ROADMAPPING; ADDITIONAL ARTERIOGRAPHY; IR ULTRASOUND GUIDANCE VASC ACCESS RIGHT MEDICATIONS: 1% lidocaine locally. ANESTHESIA/SEDATION: Moderate (conscious) sedation was employed during this procedure. A total of Versed 1.0 mg and Fentanyl 25 mcg was administered intravenously. Moderate Sedation Time: 50 minutes. The patient's level of consciousness and vital signs were monitored continuously by radiology nursing throughout the procedure under my direct supervision. CONTRAST:  1 ISOVUE-300 IOPAMIDOL (ISOVUE-300) INJECTION 61%, 1 ISOVUE-300 IOPAMIDOL (ISOVUE-300) INJECTION 61% FLUOROSCOPY TIME:  Fluoroscopy Time: 14 minutes 12 seconds (144 mGy). COMPLICATIONS: None immediate. PROCEDURE: Informed consent was obtained from the patient following explanation of the procedure, risks, benefits and alternatives. The patient understands, agrees and consents for the procedure. All questions were addressed. A time out was performed prior to the initiation of the procedure. Maximal barrier sterile technique utilized including caps, mask, sterile gowns, sterile gloves, large sterile drape, hand hygiene, and Betadine prep. Under sterile conditions and local anesthesia, ultrasound micropuncture access performed of the right common femoral artery. Five French sheath inserted. SOS catheter was utilized to select the celiac origin. Celiac angiogram performed. Celiac: Celiac origin is widely patent. Splenic, common hepatic, gastroduodenal artery, proper hepatic, right and left hepatic arteries are all patent. Irregularity of the hepatic vasculature is secondary to hepatomegaly and diffuse hepatic metastases. No active bleeding demonstrated. On the venous phase, the splenic vein and portal  vein are all patent. Through the [place] a 5 French catheter, a Renegade STC microcatheter was advanced over a micro glidewire into the proper hepatic artery. Proper hepatic angiogram: Proper hepatic, right and left hepatic arteries are patent. Vasospasm present in the proximal right hepatic artery related to wire manipulation. Limited assessment of the vasculature because of the contrast injection and poor opacification of the left hepatic artery. Over a double angled Glidewire, the microcatheter was advanced into the left hepatic artery. Left hepatic angiogram performed. Left hepatic: Left hepatic artery is patent. This is the dominant supply to the left hepatic lobe lateral segment were the percutaneous biopsy was performed. Peripheral irregularity of the arterial vasculature related to hepatic metastases. No active extravasation, acute bleeding, or other acute vascular abnormality including AV fistula or pseudoaneurysm. Because of the significant recent acute perihepatic hemorrhage and a biopsy performed in the left hepatic lobe lateral segment, Gel-Foam embolization of left hepatic artery will be performed. Left hepatic artery Gel-Foam embolization: Through the microcatheter, approximately 5 cc of Gel-Foam slurry was instilled into left hepatic artery. Following embolization, there is significant reduction in the left hepatic arterial peripheral vasculature and sluggish flow but the left hepatic main artery remains patent. Catheter was retracted and advanced into a left hepatic branch supplying the medial segment. Additional left hepatic angiogram performed. Additional left hepatic angiogram: Irregularity of the parenchymal staining related to diffuse hepatic metastases. Scattered peripheral occlusions present in the additional left hepatic branch related to the Gel-Foam embolization. Vasospasm noted at the catheter site. No active bleeding demonstrated. Microcatheter was removed. A final angiogram was performed  through the SOS 5 French catheter of the celiac origin. Celiac: Following left hepatic Gel-Foam embolization, there is significant reduction in the arterial vascularity to the the left hepatic lobe where the biopsy was performed. Residual vasospasm present in the proximal left hepatic artery. No other significant acute vascular process. Catheter was retracted  and advanced into the SMA. SMA angiogram performed. SMA angiogram: SMA is widely patent. No variant SMA supply demonstrated to the liver. On the venous phase, the superior mesenteric vein and portal vein all remain patent. Access removed. Hemostasis obtained with manual compression of the right common femoral artery puncture site. Patient tolerated the procedure well. No immediate complication. IMPRESSION: Successful celiac, hepatic, and SMA angiograms as above. Successful micro catheterization, angiograms, and Gel-Foam embolization of the left hepatic artery resulting in significant reduction in arterial flow to the area the liver were the percutaneous biopsy was performed. Electronically Signed   By: Jerilynn Mages.  Shick M.D.   On: 01/19/2017 10:01   Dg Chest Port 1v Same Day  Result Date: 01/18/2017 CLINICAL DATA:  Acute onset of shortness of breath, vomiting and epigastric abdominal pain. Fever. Initial encounter. EXAM: PORTABLE CHEST 1 VIEW COMPARISON:  Chest radiograph performed 01/16/2017 FINDINGS: The lungs are hyperexpanded, with flattening of the hemidiaphragms, compatible with COPD. There is no evidence of focal opacification, pleural effusion or pneumothorax. The cardiomediastinal silhouette is within normal limits. No acute osseous abnormalities are seen. IMPRESSION: Findings of COPD.  Lungs remain otherwise clear. Electronically Signed   By: Garald Balding M.D.   On: 01/18/2017 23:48   Dg Abd Portable 1v  Result Date: 01/18/2017 CLINICAL DATA:  Acute onset of shortness of breath, vomiting and epigastric abdominal pain. Recent liver biopsy. Fever.  Initial encounter. EXAM: PORTABLE ABDOMEN - 1 VIEW COMPARISON:  None. FINDINGS: The visualized bowel gas pattern is unremarkable. Scattered air and stool filled loops of colon are seen; no abnormal dilatation of small bowel loops is seen to suggest small bowel obstruction. No free intra-abdominal air is identified, though evaluation for free air is limited on a single supine view. The stomach is largely filled with air. The visualized osseous structures are within normal limits; the sacroiliac joints are unremarkable in appearance. IMPRESSION: Unremarkable bowel gas pattern; no free intra-abdominal air seen. Moderate amount of stool noted in the colon. Electronically Signed   By: Garald Balding M.D.   On: 01/18/2017 23:50   [place] Guide Roadmapping  Result Date: 01/19/2017 INDICATION: Acute abdominal pain with acute perihepatic hemorrhage 2 hours status post percutaneous left hepatic metastasis biopsy. EXAM: SELECTIVE VISCERAL ARTERIOGRAPHY; IR EMBO ART VEN HEMORR LYMPH EXTRAV INC GUIDE ROADMAPPING; ADDITIONAL ARTERIOGRAPHY; IR ULTRASOUND GUIDANCE VASC ACCESS RIGHT MEDICATIONS: 1% lidocaine locally. ANESTHESIA/SEDATION: Moderate (conscious) sedation was employed during this procedure. A total of Versed 1.0 mg and Fentanyl 25 mcg was administered intravenously. Moderate Sedation Time: 50 minutes. The patient's level of consciousness and vital signs were monitored continuously by radiology nursing throughout the procedure under my direct supervision. CONTRAST:  1 ISOVUE-300 IOPAMIDOL (ISOVUE-300) INJECTION 61%, 1 ISOVUE-300 IOPAMIDOL (ISOVUE-300) INJECTION 61% FLUOROSCOPY TIME:  Fluoroscopy Time: 14 minutes 12 seconds (144 mGy). COMPLICATIONS: None immediate. PROCEDURE: Informed consent was obtained from the patient following explanation of the procedure, risks, benefits and alternatives. The patient understands, agrees and consents for the procedure. All questions were  addressed. A time out was performed prior to the initiation of the procedure. Maximal barrier sterile technique utilized including caps, mask, sterile gowns, sterile gloves, large sterile drape, hand hygiene, and Betadine prep. Under sterile conditions and local anesthesia, ultrasound micropuncture access performed of the right common femoral artery. Five French sheath inserted. SOS catheter was utilized to select the celiac origin. Celiac angiogram performed. Celiac: Celiac origin is widely patent. Splenic, common hepatic, gastroduodenal artery, proper  hepatic, right and left hepatic arteries are all patent. Irregularity of the hepatic vasculature is secondary to hepatomegaly and diffuse hepatic metastases. No active bleeding demonstrated. On the venous phase, the splenic vein and portal vein are all patent. Through the [place] a 5 French catheter, a Renegade STC microcatheter was advanced over a micro glidewire into the proper hepatic artery. Proper hepatic angiogram: Proper hepatic, right and left hepatic arteries are patent. Vasospasm present in the proximal right hepatic artery related to wire manipulation. Limited assessment of the vasculature because of the contrast injection and poor opacification of the left hepatic artery. Over a double angled Glidewire, the microcatheter was advanced into the left hepatic artery. Left hepatic angiogram performed. Left hepatic: Left hepatic artery is patent. This is the dominant supply to the left hepatic lobe lateral segment were the percutaneous biopsy was performed. Peripheral irregularity of the arterial vasculature related to hepatic metastases. No active extravasation, acute bleeding, or other acute vascular abnormality including AV fistula or pseudoaneurysm. Because of the significant recent acute perihepatic hemorrhage and a biopsy performed in the left hepatic lobe lateral segment, Gel-Foam embolization of left hepatic artery will be performed. Left hepatic artery  Gel-Foam embolization: Through the microcatheter, approximately 5 cc of Gel-Foam slurry was instilled into left hepatic artery. Following embolization, there is significant reduction in the left hepatic arterial peripheral vasculature and sluggish flow but the left hepatic main artery remains patent. Catheter was retracted and advanced into a left hepatic branch supplying the medial segment. Additional left hepatic angiogram performed. Additional left hepatic angiogram: Irregularity of the parenchymal staining related to diffuse hepatic metastases. Scattered peripheral occlusions present in the additional left hepatic branch related to the Gel-Foam embolization. Vasospasm noted at the catheter site. No active bleeding demonstrated. Microcatheter was removed. A final angiogram was performed through the SOS 5 French catheter of the celiac origin. Celiac: Following left hepatic Gel-Foam embolization, there is significant reduction in the arterial vascularity to the the left hepatic lobe where the biopsy was performed. Residual vasospasm present in the proximal left hepatic artery. No other significant acute vascular process. Catheter was retracted and advanced into the SMA. SMA angiogram performed. SMA angiogram: SMA is widely patent. No variant SMA supply demonstrated to the liver. On the venous phase, the superior mesenteric vein and portal vein all remain patent. Access removed. Hemostasis obtained with manual compression of the right common femoral artery puncture site. Patient tolerated the procedure well. No immediate complication. IMPRESSION: Successful celiac, hepatic, and SMA angiograms as above. Successful micro catheterization, angiograms, and Gel-Foam embolization of the left hepatic artery resulting in significant reduction in arterial flow to the area the liver were the percutaneous biopsy was performed. Electronically Signed   By: Jerilynn Mages.  Shick M.D.   On: 01/19/2017 10:01   Ct Angio Abd/pel W/ And/or  W/o  Result Date: 01/20/2017 CLINICAL DATA:  Hepatic metastases, status post ultrasound percutaneous biopsy which was complicated by acute bleeding 2 hours after the biopsy. Patient subsequently underwent Gel-Foam embolization of left hepatic artery. Despite transfusions, further decrease in hemoglobin this morning. EXAM: CT ANGIOGRAPHY ABDOMEN AND PELVIS TECHNIQUE: Multidetector CT imaging of the abdomen and pelvis was performed using the standard protocol during bolus administration of intravenous contrast. Multiplanar reconstructed images including MIPs were obtained and reviewed to evaluate the vascular anatomy. CONTRAST:  100 cc Isovue COMPARISON:  01/18/2017 FINDINGS: Arterial findings: Aorta: Scattered moderate atherosclerosis of the aorta, most pronounced in the infrarenal aorta without occlusion, aneurysm or dissection. No retroperitoneal hemorrhage. Celiac  axis: Atherosclerotic origin but remains patent. Splenic, left gastric, and hepatic branches appear patent. No active bleeding or acute vascular process. Superior mesenteric: Atherosclerotic origin but remains patent. SMA branches appear patent. No active bleeding. Left renal:           Mild atherosclerosis but widely patent. Right renal:          Mild atherosclerosis but widely patent. Inferior mesenteric:  Atherosclerotic origin but remains patent. Left iliac: Iliac atherosclerosis noted without occlusion. Left common, internal and external iliac arteries are patent. Right iliac: Similar atherosclerosis without occlusion. Right common, internal and external iliac arteries are patent. Venous findings: Hepatic veins, portal, splenic, mesenteric veins are patent. No veno-occlusive process. IVC, renal veins common iliac veins are patent. Review of the MIP images confirms the above findings. Nonvascular findings: Basilar emphysema noted. Minor atelectasis. Normal heart size. Trace pleural effusions. No pericardial effusion. Hypodense peripherally  enhancing hepatic masses again evident compatible with metastases. No evidence of active bleeding or acute vascular process within the liver. No biliary dilatation. Gallbladder is collapsed but contains partially calcified gallstones. Moderate Heterogeneous perihepatic free fluid, compatible with a mixture of ascites and known hemoperitoneum related to the recent left hepatic mass biopsy. Spleen is unremarkable. Small amount of hemoperitoneum about the spleen upper pole. Adrenal glands, kidneys, ureters, and bladder demonstrate no acute process, obstruction or hydronephrosis. Negative for bowel obstruction, significant dilatation, ileus, or free air. No adenopathy Remote hysterectomy.  Pelvic ascites also evident. No inguinal abnormality or hernia. Intact abdominal wall. No rectus sheath hemorrhage or hematoma over the left hepatic biopsy site. Degenerative changes of the spine. Bones are mildly osteopenic. No acute osseous finding. IMPRESSION: Negative for acute or active intra-abdominal bleeding. Stable diffuse hepatic metastatic process. Stable perihepatic ascites and hemoperitoneum related to the recent left hepatic mass biopsy. Intact abdominal wall without rectus sheath hematoma or hemorrhage. Aortoiliac atherosclerosis Cholelithiasis Pelvic ascites as well These results were called by telephone at the time of interpretation on 01/20/2017 at 2:13 pm to Dr. Florencia Reasons , who verbally acknowledged these results. Electronically Signed   By: Jerilynn Mages.  Shick M.D.   On: 01/20/2017 14:15   US Abdomen Limited Ruq  Result Date: 01/18/2017 CLINICAL DATA:  61 year old female with a history of multiple liver masses, status post percutaneous biopsy today's date. Interval development of abdominal pain EXAM: US ABDOMEN LIMITED - RIGHT UPPER QUADRANT COMPARISON:  Ultrasound 01/18/2017, MRI 01/15/2017 FINDINGS: Gallbladder: Contracted gallbladder with echogenic focus in the lumen compatible with cholelithiasis. Common bile duct:  Diameter: 4 mm -5 mm Liver: Re- demonstration of mass within right liver lobe 7 point far cm by 5.4 cm x 7.4 cm. The biopsy of the left liver lobe on the comparison ultrasound today's date demonstrates small echogenic focus, compatible with hemostatic material. There is hyperechoic free fluid adjacent to the liver margin, both anterior and posterior to the left liver lobe. IMPRESSION: New hyperechoic free fluid adjacent to the left liver lobe, status post percutaneous biopsy, compatible with hemorrhage. Re- demonstration of liver masses, better characterized on prior MRI. These results were called by telephone at the time of interpretation on 01/18/2017 at 5:42 pm to Dr. Annamaria Boots who verbally acknowledged these results. Electronically Signed   By: Corrie Mckusick D.O.   On: 01/18/2017 17:43    Labs:  CBC:  Recent Labs  01/19/17 1137 01/19/17 1830 01/20/17 0511 01/20/17 1127 01/21/17 0352  WBC 33.7* 33.9* 32.8*  --  26.6*  HGB 8.1* 7.6* 6.9* 8.4* 8.4*  HCT  26.7* 25.6* 22.8* 27.5* 27.2*  PLT 712* 683* 584*  --  455*    COAGS:  Recent Labs  01/18/17 0005 01/18/17 1134  INR 1.27 1.11  APTT 32 36    BMP:  Recent Labs  01/18/17 1614 01/19/17 0639 01/20/17 0511 01/21/17 0352  NA 137 138 138 135  K 2.7* 3.5 3.8 3.5  CL 87* 94* 95* 94*  CO2 34* 31 34* 33*  GLUCOSE 146* 144* 111* 88  BUN 5* 7 6 <5*  CALCIUM 8.7* 8.3* 8.8* 8.4*  CREATININE 0.86 0.70 0.35* 0.37*  GFRNONAA >60 >60 >60 >60  GFRAA >60 >60 >60 >60    LIVER FUNCTION TESTS:  Recent Labs  01/18/17 1614 01/19/17 0639 01/20/17 0511 01/21/17 0352  BILITOT 0.7 0.4 0.3 0.4  AST 31 50* 56* 37  ALT 12* 15 20 17   ALKPHOS 186* 148* 138* 130*  PROT 7.3 6.7 6.3* 5.8*  ALBUMIN 2.0* 1.9* 1.9* 1.7*    Assessment and Plan: 1. S/p liver lesion bx on 2/23 with subsequent bleed requiring mesenteric angio with embolization 2/23 -patient had a CTA yesterday that revealed no further evidence of active bleeding.  Her hgb after  transfusion has remained stable with no evidence of bleeding from this standpoint as well. -her WBC is improving from this weekend.  Her blood CXs from Friday are negative.  Suspect this secondary to her malignant process.  She is still running low-grade fevers as well.   -we will continue to follow the patient.   -bx results are pending.  Electronically Signed: Henreitta Cea 01/21/2017, 12:22 PM   I spent a total of 15 Minutes at the the patient's bedside AND on the patient's hospital floor or unit, greater than 50% of which was counseling/coordinating care for hemorrhage after liver biospy

## 2017-01-21 NOTE — Progress Notes (Signed)
PROGRESS NOTE  Rachel Butler [id number] DOB: 05/22/1956 DOA: 01/18/2017 PCP: Viviana Simpler, MD  HPI/Recap of past 24 hours:  Sitting up in chair, report feeling better, ab pain has improved, , no overt bleeding, tmax last 24hr 100.4 Husband and multiple family members at bedside  Assessment/Plan: Principal Problem:   Sepsis ([place]) Active Problems:   Nicotine dependence   Lumbar disc disease   Chronic pain   Liver lesion  Sepsis presented on admission with hypotension, fever 101, tachycardia, leukocytosis, lactic acidosis She is admitted to stepdown unit.  cxr + copd but no acute findings, blood culture no growth, ua with concentrated urine but no infection, source of infection likely hepatobiliary She is started on vanc/aztreonam ( h/o pcn allergy) ,she received ivf,  Improving. mrsa screen negative, d/c vanc, d/c aztroenam. Change abx to cipro and flagyl to over hepatobiliary source. Fever could also from maligancy  Acute perihepatic hemorrhage after left hepatic mass core biopsy: s/p prbc transfusion, s/p S/p visceral angios and left hepatic artery gelfoam embo on 2/23 Due to drop of hgb, CAT agio ab/pel recommended by IR Dr Annamaria Boots on 2/25, CTA neg for acute or active bleeding in the abdomen or pelvis Hold on any additional angio per Ddr Shick IR following  Bleeding in right groin s/p angio Bleed stopped after Pressure held to right groin times 30 minutes , dressing reinforced Close monitor  Acute blood loss anemia: s/p prbc transfusionx2, monitor cbc  Leukocytosis/thrombocytosis: likely reactive. monitor  Hypokalemia: replace k, check mag  Multiple Liver Lesions: high suspicion for a malignancy ([place] vs cholangiocarcinoma)-await bx results. Long hx of tobacco use-did have colonoscopy (2016-Dr Pyrtle)-this was negative.  AFPwnl ,cea mildly elevated at 6.9,  ldh mildly elevated at  276 hepatitis panel negative for hepA/B, hcv ab 0.2, will check hepc viral load, hiv  pending Biopsy result pending   Chronic Back Pain with chronic narcotic dependence: [facility] hold narcotics given hypotension-resume when able  COPD: stable, no wheezing, no cough  Nicotine dependence:. Smoking cessation education   Diet : resume diet  Code Status: FULL  Family Communication: patient and husband at bedside  Disposition Plan: likely home on 2/27 with IR clearance   Consultants:  IR  Procedures:  left hepatic mass core biopsy on 2/23  isceral angios and left hepatic artery gelfoam embo on 2/23 prbc transfusion on 2/23 and on 2/25   Antibiotics:  vanc from admission to 2/25  aztreonam from admission to 2/26  Cipro/flagyl from 2/26   Objective: BP 128/76 (BP Location: Left Arm)   Pulse 89   Temp 98.3 F (36.8 C) (Oral)   Resp 15   Ht 5\' 3"  (1.6 m)   Wt 47.4 kg (104 lb 6.4 oz)   SpO2 98%   BMI 18.49 kg/m   Intake/Output Summary (Last 24 hours) at 01/21/17 0846 Last data filed at 01/21/17 [id number]  Gross per 24 hour  Intake          1627.25 ml  Output              950 ml  Net           677.25 ml   Filed Weights   01/19/17 0500 01/20/17 0443 01/21/17 0500  Weight: 48.2 kg (106 lb 4.8 oz) 45.9 kg (101 lb 4.8 oz) 47.4 kg (104 lb 6.4 oz)    Exam:   General:  NAD  Cardiovascular: tachycardia has improved  Respiratory: CTABL  Abdomen: less  tender right upper quadrant, no rebound,positive BS  Musculoskeletal: No Edema  Neuro: aaox3, flat affect   Data Reviewed: Basic Metabolic Panel:  Recent Labs Lab 01/18/17 1614 01/19/17 0639 01/20/17 0511 01/21/17 0352  NA 137 138 138 135  K 2.7* 3.5 3.8 3.5  CL 87* 94* 95* 94*  CO2 34* 31 34* 33*  GLUCOSE 146* 144* 111* 88  BUN 5* 7 6 <5*  CREATININE 0.86 0.70 0.35* 0.37*  CALCIUM 8.7* 8.3* 8.8* 8.4*  MG  --   --  1.9  --    Liver Function Tests:  Recent Labs Lab 01/18/17 1614 01/19/17 0639 01/20/17 0511 01/21/17 0352  AST 31 50* 56* 37  ALT  12* 15 20 17   ALKPHOS 186* 148* 138* 130*  BILITOT 0.7 0.4 0.3 0.4  PROT 7.3 6.7 6.3* 5.8*  ALBUMIN 2.0* 1.9* 1.9* 1.7*   No results for input(s): LIPASE, AMYLASE in the last 168 hours. No results for input(s): AMMONIA in the last 168 hours. CBC:  Recent Labs Lab 01/19/17 0631 01/19/17 1137 01/19/17 1830 01/20/17 0511 01/20/17 1127 01/21/17 0352  WBC 32.2* 33.7* 33.9* 32.8*  --  26.6*  NEUTROABS 27.6*  --   --  28.9*  --  PENDING  HGB 8.2* 8.1* 7.6* 6.9* 8.4* 8.4*  HCT 27.0* 26.7* 25.6* 22.8* 27.5* 27.2*  MCV 84.1 84.2 84.8 85.7  --  85.8  PLT 770* 712* 683* 584*  --  455*   Cardiac Enzymes:    Recent Labs Lab 01/18/17 1558 01/19/17 0639  TROPONINI 0.03* 0.03*   BNP (last 3 results) No results for input(s): BNP in the last 8760 hours.  ProBNP (last 3 results) No results for input(s): PROBNP in the last 8760 hours.  CBG: No results for input(s): GLUCAP in the last 168 hours.  Recent Results (from the past 240 hour(s))  Culture, blood (Routine X 2) w Reflex to ID Panel     Status: None (Preliminary result)   Collection Time: 01/18/17  5:50 PM  Result Value Ref Range Status   Specimen Description BLOOD LEFT ANTECUBITAL  Final   Special Requests BOTTLES DRAWN AEROBIC AND ANAEROBIC 4CC  Final   Culture NO GROWTH 2 DAYS  Final   Report Status PENDING  Incomplete  Culture, blood (Routine X 2) w Reflex to ID Panel     Status: None (Preliminary result)   Collection Time: 01/18/17  6:12 PM  Result Value Ref Range Status   Specimen Description BLOOD LEFT HAND  Final   Special Requests BOTTLES DRAWN AEROBIC AND ANAEROBIC 3CC  Final   Culture NO GROWTH 2 DAYS  Final   Report Status PENDING  Incomplete  MRSA PCR Screening     Status: None   Collection Time: 01/18/17  8:50 PM  Result Value Ref Range Status   MRSA by PCR NEGATIVE NEGATIVE Final    Comment:        The GeneXpert MRSA Assay (FDA approved for NASAL specimens only), is one component of a comprehensive MRSA  colonization surveillance program. It is not intended to diagnose MRSA infection nor to guide or monitor treatment for MRSA infections.   Culture, blood (x 2)     Status: None (Preliminary result)   Collection Time: 01/18/17 11:51 PM  Result Value Ref Range Status   Specimen Description BLOOD RIGHT ARM  Final   Special Requests BOTTLES DRAWN AEROBIC AND ANAEROBIC 3ML  Final   Culture NO GROWTH 1 DAY  Final   Report Status  PENDING  Incomplete  Culture, blood (x 2)     Status: None (Preliminary result)   Collection Time: 01/18/17 11:51 PM  Result Value Ref Range Status   Specimen Description BLOOD RIGHT ARM  Final   Special Requests BOTTLES DRAWN AEROBIC AND ANAEROBIC 3ML  Final   Culture NO GROWTH 1 DAY  Final   Report Status PENDING  Incomplete  Culture, Urine     Status: None   Collection Time: 01/19/17  6:25 AM  Result Value Ref Range Status   Specimen Description URINE, RANDOM  Final   Special Requests NONE  Final   Culture NO GROWTH  Final   Report Status 01/20/2017 FINAL  Final     Studies: Ct Angio Abd/pel W/ And/or W/o  Result Date: 01/20/2017 CLINICAL DATA:  Hepatic metastases, status post ultrasound percutaneous biopsy which was complicated by acute bleeding 2 hours after the biopsy. Patient subsequently underwent Gel-Foam embolization of left hepatic artery. Despite transfusions, further decrease in hemoglobin this morning. EXAM: CT ANGIOGRAPHY ABDOMEN AND PELVIS TECHNIQUE: Multidetector CT imaging of the abdomen and pelvis was performed using the standard protocol during bolus administration of intravenous contrast. Multiplanar reconstructed images including MIPs were obtained and reviewed to evaluate the vascular anatomy. CONTRAST:  100 cc Isovue COMPARISON:  01/18/2017 FINDINGS: Arterial findings: Aorta: Scattered moderate atherosclerosis of the aorta, most pronounced in the infrarenal aorta without occlusion, aneurysm or dissection. No retroperitoneal hemorrhage. Celiac  axis: Atherosclerotic origin but remains patent. Splenic, left gastric, and hepatic branches appear patent. No active bleeding or acute vascular process. Superior mesenteric: Atherosclerotic origin but remains patent. SMA branches appear patent. No active bleeding. Left renal:           Mild atherosclerosis but widely patent. Right renal:          Mild atherosclerosis but widely patent. Inferior mesenteric:  Atherosclerotic origin but remains patent. Left iliac: Iliac atherosclerosis noted without occlusion. Left common, internal and external iliac arteries are patent. Right iliac: Similar atherosclerosis without occlusion. Right common, internal and external iliac arteries are patent. Venous findings: Hepatic veins, portal, splenic, mesenteric veins are patent. No veno-occlusive process. IVC, renal veins common iliac veins are patent. Review of the MIP images confirms the above findings. Nonvascular findings: Basilar emphysema noted. Minor atelectasis. Normal heart size. Trace pleural effusions. No pericardial effusion. Hypodense peripherally enhancing hepatic masses again evident compatible with metastases. No evidence of active bleeding or acute vascular process within the liver. No biliary dilatation. Gallbladder is collapsed but contains partially calcified gallstones. Moderate Heterogeneous perihepatic free fluid, compatible with a mixture of ascites and known hemoperitoneum related to the recent left hepatic mass biopsy. Spleen is unremarkable. Small amount of hemoperitoneum about the spleen upper pole. Adrenal glands, kidneys, ureters, and bladder demonstrate no acute process, obstruction or hydronephrosis. Negative for bowel obstruction, significant dilatation, ileus, or free air. No adenopathy Remote hysterectomy.  Pelvic ascites also evident. No inguinal abnormality or hernia. Intact abdominal wall. No rectus sheath hemorrhage or hematoma over the left hepatic biopsy site. Degenerative changes of the  spine. Bones are mildly osteopenic. No acute osseous finding. IMPRESSION: Negative for acute or active intra-abdominal bleeding. Stable diffuse hepatic metastatic process. Stable perihepatic ascites and hemoperitoneum related to the recent left hepatic mass biopsy. Intact abdominal wall without rectus sheath hematoma or hemorrhage. Aortoiliac atherosclerosis Cholelithiasis Pelvic ascites as well These results were called by telephone at the time of interpretation on 01/20/2017 at 2:13 pm to Dr. Florencia Reasons , who verbally acknowledged these  results. Electronically Signed   By: Jerilynn Mages.  Shick M.D.   On: 01/20/2017 14:15    Scheduled Meds: . sodium chloride   Intravenous Once  . acetaminophen  650 mg Oral Once  . aztreonam  1 g Intravenous Q8H  . diphenhydrAMINE  25 mg Intravenous Once  . fentaNYL  25 mcg Transdermal Q72H  . gabapentin  400 mg Oral TID  . potassium chloride  40 mEq Oral Once  . senna-docusate  2 tablet Oral QHS  . sodium chloride flush  3 mL Intravenous Q12H  . sodium chloride flush  3 mL Intravenous Q12H    Continuous Infusions:    Time spent: 37mins  Devani Odonnel MD, PhD  [facility] Pager [phone]. If 7PM-7AM, please contact night-coverage at [email or web address], password [email or web address] 01/21/2017, 8:46 AM  LOS: 3 days

## 2017-01-21 NOTE — Telephone Encounter (Signed)
Spoke to husband. Reviewed all that has gone on. Hopefully will go home soon (not sure why she would have infection in this setting---seemed to be just the bleed) She told me that she was having some low grade fevers even before coming to see me or having the biopsy--could be from the liver lesions.  I reassured him that I would let her know as soon as I got a report on the biopsy

## 2017-01-22 ENCOUNTER — Inpatient Hospital Stay (HOSPITAL_COMMUNITY): Payer: Medicare Other

## 2017-01-22 ENCOUNTER — Encounter (HOSPITAL_COMMUNITY): Payer: Self-pay

## 2017-01-22 ENCOUNTER — Other Ambulatory Visit: Payer: Self-pay | Admitting: Internal Medicine

## 2017-01-22 DIAGNOSIS — I339 Acute and subacute endocarditis, unspecified: Secondary | ICD-10-CM

## 2017-01-22 DIAGNOSIS — R9431 Abnormal electrocardiogram [ECG] [EKG]: Secondary | ICD-10-CM

## 2017-01-22 DIAGNOSIS — C787 Secondary malignant neoplasm of liver and intrahepatic bile duct: Secondary | ICD-10-CM

## 2017-01-22 DIAGNOSIS — J9601 Acute respiratory failure with hypoxia: Secondary | ICD-10-CM

## 2017-01-22 LAB — COMPREHENSIVE METABOLIC PANEL
ALBUMIN: 1.7 g/dL — AB (ref 3.5–5.0)
ALK PHOS: 134 U/L — AB (ref 38–126)
ALT: 16 U/L (ref 14–54)
ANION GAP: 11 (ref 5–15)
AST: 31 U/L (ref 15–41)
BILIRUBIN TOTAL: 0.6 mg/dL (ref 0.3–1.2)
BUN: <5 mg/dL — ABNORMAL LOW (ref 6–20)
CALCIUM: 8.4 mg/dL — AB (ref 8.9–10.3)
CO2: 30 mmol/L (ref 22–32)
Chloride: 94 mmol/L — ABNORMAL LOW (ref 101–111)
Creatinine, Ser: 0.36 mg/dL — ABNORMAL LOW (ref 0.44–1.00)
GLUCOSE: 87 mg/dL (ref 65–99)
POTASSIUM: 4 mmol/L (ref 3.5–5.1)
Sodium: 135 mmol/L (ref 135–145)
Total Protein: 6 g/dL — ABNORMAL LOW (ref 6.5–8.1)

## 2017-01-22 LAB — CBC WITH DIFFERENTIAL/PLATELET
BASOS ABS: 0 10*3/uL (ref 0.0–0.1)
BASOS PCT: 0 %
Eosinophils Absolute: 0 10*3/uL (ref 0.0–0.7)
Eosinophils Relative: 0 %
HEMATOCRIT: 28.3 % — AB (ref 36.0–46.0)
HEMOGLOBIN: 8.5 g/dL — AB (ref 12.0–15.0)
LYMPHS ABS: 2.5 10*3/uL (ref 0.7–4.0)
LYMPHS PCT: 10 %
MCH: 25.8 pg — AB (ref 26.0–34.0)
MCHC: 30 g/dL (ref 30.0–36.0)
MCV: 86 fL (ref 78.0–100.0)
MONOS PCT: 9 %
Monocytes Absolute: 2.2 10*3/uL — ABNORMAL HIGH (ref 0.1–1.0)
NEUTROS ABS: 19.8 10*3/uL — AB (ref 1.7–7.7)
Neutrophils Relative %: 81 %
Platelets: 464 10*3/uL — ABNORMAL HIGH (ref 150–400)
RBC: 3.29 MIL/uL — ABNORMAL LOW (ref 3.87–5.11)
RDW: 17.5 % — AB (ref 11.5–15.5)
WBC: 24.5 10*3/uL — ABNORMAL HIGH (ref 4.0–10.5)

## 2017-01-22 LAB — ECHOCARDIOGRAM COMPLETE
HEIGHTINCHES: 63 in
Weight: 1678.4 oz

## 2017-01-22 LAB — PATHOLOGIST SMEAR REVIEW

## 2017-01-22 MED ORDER — POLYETHYLENE GLYCOL 3350 17 G PO PACK: 17.0000 g | PACK | Freq: Every day | ORAL | 0 refills | Status: AC

## 2017-01-22 MED ORDER — IOPAMIDOL (ISOVUE-370) INJECTION 76%
INTRAVENOUS | Status: AC
  Administered 2017-01-22: 80 mL
  Filled 2017-01-22: qty 100

## 2017-01-22 NOTE — Progress Notes (Signed)
Referring Physician(s): Letvak,Richard I  Supervising Physician: Daryll Brod  Patient Status:  [facility] - In-pt  Chief Complaint:  Liver bx with post bx hemorrhage  2/23: Acute perihepatic hemorrhage after left hepatic mass core bx S/p visceral angios and left hepatic artery gelfoam embo  Subjective:  Up in chair Stable No further bleeding noted Hg stable 8.5 "ready to go home"  Diagnosis Liver, needle/core biopsy, Left - METASTATIC ADENOCARCINOMA.   Allergies: Endoxcin [lidocaine-menthol]; Nubain [nalbuphine hcl]; Penicillins; and Robaxin [methocarbamol]  Medications: Prior to Admission medications   Medication Sig Start Date End Date Taking? Authorizing Provider  Aspirin-Salicylamide-Caffeine (BC HEADACHE POWDER PO) Take 1 Package by mouth 2 (two) times daily. Pain   Yes Historical Provider, MD  BIOTIN 5000 PO Take by mouth daily.   Yes Historical Provider, MD  fentaNYL (DURAGESIC - DOSED MCG/HR) 25 MCG/HR patch Place 25 mcg onto the skin every 3 (three) days.  12/13/14  Yes Historical Provider, MD  furosemide (LASIX) 40 MG tablet TAKE 1 TABLET BY MOUTH ONCE A DAY 12/20/16  Yes Venia Carbon, MD  gabapentin (NEURONTIN) 800 MG tablet Take 1 tablet (800 mg total) by mouth 3 (three) times daily. 01/09/17  Yes Venia Carbon, MD  HYDROcodone-acetaminophen (NORCO/VICODIN) 5-325 MG per tablet Take 1 tablet by mouth 3 (three) times daily as needed for pain.   Yes Historical Provider, MD  LORazepam (ATIVAN) 0.5 MG tablet 1-2 tablets 1 hour before MRI. May repeat as needed. 01/14/17  Yes Venia Carbon, MD  Multiple Vitamins-Minerals (MULTIVITAMIN WITH MINERALS) tablet Take 1 tablet by mouth daily.   Yes Historical Provider, MD  promethazine (PHENERGAN) 25 MG tablet TAKE 0.5-1 TABLETS (12.5-25 MG TOTAL) BY MOUTH 3 (THREE) TIMES DAILY AS NEEDED FOR NAUSEA. 06/28/16  Yes Venia Carbon, MD  sennosides-docusate sodium (SENOKOT-S) 8.6-50 MG tablet Take 2 tablets by mouth daily.    Yes Historical Provider, MD     Vital Signs: BP 134/81 (BP Location: Left Arm)   Pulse 94   Temp 99.2 F (37.3 C) (Oral)   Resp 10   Ht 5\' 3"  (1.6 m)   Wt 104 lb 14.4 oz (47.6 kg)   SpO2 99%   BMI 18.58 kg/m   Physical Exam  Constitutional: She is oriented to person, place, and time.  Abdominal: Soft. Bowel sounds are normal. She exhibits distension.  Musculoskeletal: Normal range of motion.  Neurological: She is alert and oriented to person, place, and time.  Skin: Skin is warm and dry.  Rt groin NT no bleeding No hematoma  Liver bx site Clean and dry  NT  Psychiatric: She has a normal mood and affect. Her behavior is normal.  Nursing note and vitals reviewed.   Imaging: Ct Angio Chest Pe W Or Wo Contrast  Result Date: 01/22/2017 CLINICAL DATA:  Hypoxia. EXAM: CT ANGIOGRAPHY CHEST WITH CONTRAST TECHNIQUE: Multidetector CT imaging of the chest was performed using the standard protocol during bolus administration of intravenous contrast. Multiplanar CT image reconstructions and MIPs were obtained to evaluate the vascular anatomy. CONTRAST:  80 mL Isovue 370 COMPARISON:  One-view chest x-ray 01/18/2017. CT of the abdomen and pelvis 01/20/2017 FINDINGS: Cardiovascular: The heart size is normal. The pulmonary arteries demonstrate excellent opacification. There is focal filling defect to suggest pulmonary embolus. Pulmonary arterial size is normal. The aorta demonstrates atherosclerotic change without aneurysm. A 4 vessel arch configuration is present. The left vertebral artery Mediastinum/Nodes: No significant mediastinal or axillary adenopathy is present. The esophagus is  unremarkable. Secretions versus small tracheal nodules are evident. Lungs/Pleura: Centrilobular emphysema is present. No focal nodule or mass lesion is present. Mild dependent atelectasis is present bilaterally. There are small bilateral pleural effusions. Upper Abdomen: Liver metastases are again noted.  Musculoskeletal: No focal lytic or blastic lesions are present. Vertebral body heights alignment are maintained. Review of the MIP images confirms the above findings. IMPRESSION: 1. No pulmonary embolus. 2. Centrilobular emphysema. 3. Secretions versus small tracheal nodules. Short-term follow-up CT scan of or bronchoscopy would be useful for further evaluation. 4. Aortic atherosclerosis. 5. Liver metastases. Electronically Signed   By: San Morelle M.D.   On: 01/22/2017 09:53   Ir Angiogram Visceral Selective  Result Date: 01/19/2017 INDICATION: Acute abdominal pain with acute perihepatic hemorrhage 2 hours status post percutaneous left hepatic metastasis biopsy. EXAM: SELECTIVE VISCERAL ARTERIOGRAPHY; IR EMBO ART VEN HEMORR LYMPH EXTRAV INC GUIDE ROADMAPPING; ADDITIONAL ARTERIOGRAPHY; IR ULTRASOUND GUIDANCE VASC ACCESS RIGHT MEDICATIONS: 1% lidocaine locally. ANESTHESIA/SEDATION: Moderate (conscious) sedation was employed during this procedure. A total of Versed 1.0 mg and Fentanyl 25 mcg was administered intravenously. Moderate Sedation Time: 50 minutes. The patient's level of consciousness and vital signs were monitored continuously by radiology nursing throughout the procedure under my direct supervision. CONTRAST:  1 ISOVUE-300 IOPAMIDOL (ISOVUE-300) INJECTION 61%, 1 ISOVUE-300 IOPAMIDOL (ISOVUE-300) INJECTION 61% FLUOROSCOPY TIME:  Fluoroscopy Time: 14 minutes 12 seconds (144 mGy). COMPLICATIONS: None immediate. PROCEDURE: Informed consent was obtained from the patient following explanation of the procedure, risks, benefits and alternatives. The patient understands, agrees and consents for the procedure. All questions were addressed. A time out was performed prior to the initiation of the procedure. Maximal barrier sterile technique utilized including caps, mask, sterile gowns, sterile gloves, large sterile drape, hand hygiene, and Betadine prep. Under sterile conditions and local anesthesia,  ultrasound micropuncture access performed of the right common femoral artery. Five French sheath inserted. SOS catheter was utilized to select the celiac origin. Celiac angiogram performed. Celiac: Celiac origin is widely patent. Splenic, common hepatic, gastroduodenal artery, proper hepatic, right and left hepatic arteries are all patent. Irregularity of the hepatic vasculature is secondary to hepatomegaly and diffuse hepatic metastases. No active bleeding demonstrated. On the venous phase, the splenic vein and portal vein are all patent. Through the [place] a 5 French catheter, a Renegade STC microcatheter was advanced over a micro glidewire into the proper hepatic artery. Proper hepatic angiogram: Proper hepatic, right and left hepatic arteries are patent. Vasospasm present in the proximal right hepatic artery related to wire manipulation. Limited assessment of the vasculature because of the contrast injection and poor opacification of the left hepatic artery. Over a double angled Glidewire, the microcatheter was advanced into the left hepatic artery. Left hepatic angiogram performed. Left hepatic: Left hepatic artery is patent. This is the dominant supply to the left hepatic lobe lateral segment were the percutaneous biopsy was performed. Peripheral irregularity of the arterial vasculature related to hepatic metastases. No active extravasation, acute bleeding, or other acute vascular abnormality including AV fistula or pseudoaneurysm. Because of the significant recent acute perihepatic hemorrhage and a biopsy performed in the left hepatic lobe lateral segment, Gel-Foam embolization of left hepatic artery will be performed. Left hepatic artery Gel-Foam embolization: Through the microcatheter, approximately 5 cc of Gel-Foam slurry was instilled into left hepatic artery. Following embolization, there is significant reduction in the left hepatic arterial peripheral vasculature and sluggish flow but the left hepatic main  artery remains patent. Catheter was retracted and advanced into a left  hepatic branch supplying the medial segment. Additional left hepatic angiogram performed. Additional left hepatic angiogram: Irregularity of the parenchymal staining related to diffuse hepatic metastases. Scattered peripheral occlusions present in the additional left hepatic branch related to the Gel-Foam embolization. Vasospasm noted at the catheter site. No active bleeding demonstrated. Microcatheter was removed. A final angiogram was performed through the SOS 5 French catheter of the celiac origin. Celiac: Following left hepatic Gel-Foam embolization, there is significant reduction in the arterial vascularity to the the left hepatic lobe where the biopsy was performed. Residual vasospasm present in the proximal left hepatic artery. No other significant acute vascular process. Catheter was retracted and advanced into the SMA. SMA angiogram performed. SMA angiogram: SMA is widely patent. No variant SMA supply demonstrated to the liver. On the venous phase, the superior mesenteric vein and portal vein all remain patent. Access removed. Hemostasis obtained with manual compression of the right common femoral artery puncture site. Patient tolerated the procedure well. No immediate complication. IMPRESSION: Successful celiac, hepatic, and SMA angiograms as above. Successful micro catheterization, angiograms, and Gel-Foam embolization of the left hepatic artery resulting in significant reduction in arterial flow to the area the liver were the percutaneous biopsy was performed. Electronically Signed   By: Jerilynn Mages.  Shick M.D.   On: 01/19/2017 10:01   Ir Angiogram Visceral Selective  Result Date: 01/19/2017 INDICATION: Acute abdominal pain with acute perihepatic hemorrhage 2 hours status post percutaneous left hepatic metastasis biopsy. EXAM: SELECTIVE VISCERAL ARTERIOGRAPHY; IR EMBO ART VEN HEMORR LYMPH EXTRAV INC GUIDE ROADMAPPING; ADDITIONAL  ARTERIOGRAPHY; IR ULTRASOUND GUIDANCE VASC ACCESS RIGHT MEDICATIONS: 1% lidocaine locally. ANESTHESIA/SEDATION: Moderate (conscious) sedation was employed during this procedure. A total of Versed 1.0 mg and Fentanyl 25 mcg was administered intravenously. Moderate Sedation Time: 50 minutes. The patient's level of consciousness and vital signs were monitored continuously by radiology nursing throughout the procedure under my direct supervision. CONTRAST:  1 ISOVUE-300 IOPAMIDOL (ISOVUE-300) INJECTION 61%, 1 ISOVUE-300 IOPAMIDOL (ISOVUE-300) INJECTION 61% FLUOROSCOPY TIME:  Fluoroscopy Time: 14 minutes 12 seconds (144 mGy). COMPLICATIONS: None immediate. PROCEDURE: Informed consent was obtained from the patient following explanation of the procedure, risks, benefits and alternatives. The patient understands, agrees and consents for the procedure. All questions were addressed. A time out was performed prior to the initiation of the procedure. Maximal barrier sterile technique utilized including caps, mask, sterile gowns, sterile gloves, large sterile drape, hand hygiene, and Betadine prep. Under sterile conditions and local anesthesia, ultrasound micropuncture access performed of the right common femoral artery. Five French sheath inserted. SOS catheter was utilized to select the celiac origin. Celiac angiogram performed. Celiac: Celiac origin is widely patent. Splenic, common hepatic, gastroduodenal artery, proper hepatic, right and left hepatic arteries are all patent. Irregularity of the hepatic vasculature is secondary to hepatomegaly and diffuse hepatic metastases. No active bleeding demonstrated. On the venous phase, the splenic vein and portal vein are all patent. Through the [place] a 5 French catheter, a Renegade STC microcatheter was advanced over a micro glidewire into the proper hepatic artery. Proper hepatic angiogram: Proper hepatic, right and left hepatic arteries are patent. Vasospasm present in the proximal  right hepatic artery related to wire manipulation. Limited assessment of the vasculature because of the contrast injection and poor opacification of the left hepatic artery. Over a double angled Glidewire, the microcatheter was advanced into the left hepatic artery. Left hepatic angiogram performed. Left hepatic: Left hepatic artery is patent. This is the dominant supply to the left hepatic lobe lateral segment  were the percutaneous biopsy was performed. Peripheral irregularity of the arterial vasculature related to hepatic metastases. No active extravasation, acute bleeding, or other acute vascular abnormality including AV fistula or pseudoaneurysm. Because of the significant recent acute perihepatic hemorrhage and a biopsy performed in the left hepatic lobe lateral segment, Gel-Foam embolization of left hepatic artery will be performed. Left hepatic artery Gel-Foam embolization: Through the microcatheter, approximately 5 cc of Gel-Foam slurry was instilled into left hepatic artery. Following embolization, there is significant reduction in the left hepatic arterial peripheral vasculature and sluggish flow but the left hepatic main artery remains patent. Catheter was retracted and advanced into a left hepatic branch supplying the medial segment. Additional left hepatic angiogram performed. Additional left hepatic angiogram: Irregularity of the parenchymal staining related to diffuse hepatic metastases. Scattered peripheral occlusions present in the additional left hepatic branch related to the Gel-Foam embolization. Vasospasm noted at the catheter site. No active bleeding demonstrated. Microcatheter was removed. A final angiogram was performed through the SOS 5 French catheter of the celiac origin. Celiac: Following left hepatic Gel-Foam embolization, there is significant reduction in the arterial vascularity to the the left hepatic lobe where the biopsy was performed. Residual vasospasm present in the proximal left  hepatic artery. No other significant acute vascular process. Catheter was retracted and advanced into the SMA. SMA angiogram performed. SMA angiogram: SMA is widely patent. No variant SMA supply demonstrated to the liver. On the venous phase, the superior mesenteric vein and portal vein all remain patent. Access removed. Hemostasis obtained with manual compression of the right common femoral artery puncture site. Patient tolerated the procedure well. No immediate complication. IMPRESSION: Successful celiac, hepatic, and SMA angiograms as above. Successful micro catheterization, angiograms, and Gel-Foam embolization of the left hepatic artery resulting in significant reduction in arterial flow to the area the liver were the percutaneous biopsy was performed. Electronically Signed   By: Jerilynn Mages.  Shick M.D.   On: 01/19/2017 10:01   Ir Angiogram Selective Each Additional Vessel  Result Date: 01/19/2017 INDICATION: Acute abdominal pain with acute perihepatic hemorrhage 2 hours status post percutaneous left hepatic metastasis biopsy. EXAM: SELECTIVE VISCERAL ARTERIOGRAPHY; IR EMBO ART VEN HEMORR LYMPH EXTRAV INC GUIDE ROADMAPPING; ADDITIONAL ARTERIOGRAPHY; IR ULTRASOUND GUIDANCE VASC ACCESS RIGHT MEDICATIONS: 1% lidocaine locally. ANESTHESIA/SEDATION: Moderate (conscious) sedation was employed during this procedure. A total of Versed 1.0 mg and Fentanyl 25 mcg was administered intravenously. Moderate Sedation Time: 50 minutes. The patient's level of consciousness and vital signs were monitored continuously by radiology nursing throughout the procedure under my direct supervision. CONTRAST:  1 ISOVUE-300 IOPAMIDOL (ISOVUE-300) INJECTION 61%, 1 ISOVUE-300 IOPAMIDOL (ISOVUE-300) INJECTION 61% FLUOROSCOPY TIME:  Fluoroscopy Time: 14 minutes 12 seconds (144 mGy). COMPLICATIONS: None immediate. PROCEDURE: Informed consent was obtained from the patient following explanation of the procedure, risks, benefits and alternatives. The  patient understands, agrees and consents for the procedure. All questions were addressed. A time out was performed prior to the initiation of the procedure. Maximal barrier sterile technique utilized including caps, mask, sterile gowns, sterile gloves, large sterile drape, hand hygiene, and Betadine prep. Under sterile conditions and local anesthesia, ultrasound micropuncture access performed of the right common femoral artery. Five French sheath inserted. SOS catheter was utilized to select the celiac origin. Celiac angiogram performed. Celiac: Celiac origin is widely patent. Splenic, common hepatic, gastroduodenal artery, proper hepatic, right and left hepatic arteries are all patent. Irregularity of the hepatic vasculature is secondary to hepatomegaly and diffuse hepatic metastases. No active bleeding demonstrated. On  the venous phase, the splenic vein and portal vein are all patent. Through the [place] a 5 French catheter, a Renegade STC microcatheter was advanced over a micro glidewire into the proper hepatic artery. Proper hepatic angiogram: Proper hepatic, right and left hepatic arteries are patent. Vasospasm present in the proximal right hepatic artery related to wire manipulation. Limited assessment of the vasculature because of the contrast injection and poor opacification of the left hepatic artery. Over a double angled Glidewire, the microcatheter was advanced into the left hepatic artery. Left hepatic angiogram performed. Left hepatic: Left hepatic artery is patent. This is the dominant supply to the left hepatic lobe lateral segment were the percutaneous biopsy was performed. Peripheral irregularity of the arterial vasculature related to hepatic metastases. No active extravasation, acute bleeding, or other acute vascular abnormality including AV fistula or pseudoaneurysm. Because of the significant recent acute perihepatic hemorrhage and a biopsy performed in the left hepatic lobe lateral segment, Gel-Foam  embolization of left hepatic artery will be performed. Left hepatic artery Gel-Foam embolization: Through the microcatheter, approximately 5 cc of Gel-Foam slurry was instilled into left hepatic artery. Following embolization, there is significant reduction in the left hepatic arterial peripheral vasculature and sluggish flow but the left hepatic main artery remains patent. Catheter was retracted and advanced into a left hepatic branch supplying the medial segment. Additional left hepatic angiogram performed. Additional left hepatic angiogram: Irregularity of the parenchymal staining related to diffuse hepatic metastases. Scattered peripheral occlusions present in the additional left hepatic branch related to the Gel-Foam embolization. Vasospasm noted at the catheter site. No active bleeding demonstrated. Microcatheter was removed. A final angiogram was performed through the SOS 5 French catheter of the celiac origin. Celiac: Following left hepatic Gel-Foam embolization, there is significant reduction in the arterial vascularity to the the left hepatic lobe where the biopsy was performed. Residual vasospasm present in the proximal left hepatic artery. No other significant acute vascular process. Catheter was retracted and advanced into the SMA. SMA angiogram performed. SMA angiogram: SMA is widely patent. No variant SMA supply demonstrated to the liver. On the venous phase, the superior mesenteric vein and portal vein all remain patent. Access removed. Hemostasis obtained with manual compression of the right common femoral artery puncture site. Patient tolerated the procedure well. No immediate complication. IMPRESSION: Successful celiac, hepatic, and SMA angiograms as above. Successful micro catheterization, angiograms, and Gel-Foam embolization of the left hepatic artery resulting in significant reduction in arterial flow to the area the liver were the percutaneous biopsy was performed. Electronically Signed   By:  Jerilynn Mages.  Shick M.D.   On: 01/19/2017 10:01   Ir Angiogram Selective Each Additional Vessel  Result Date: 01/19/2017 INDICATION: Acute abdominal pain with acute perihepatic hemorrhage 2 hours status post percutaneous left hepatic metastasis biopsy. EXAM: SELECTIVE VISCERAL ARTERIOGRAPHY; IR EMBO ART VEN HEMORR LYMPH EXTRAV INC GUIDE ROADMAPPING; ADDITIONAL ARTERIOGRAPHY; IR ULTRASOUND GUIDANCE VASC ACCESS RIGHT MEDICATIONS: 1% lidocaine locally. ANESTHESIA/SEDATION: Moderate (conscious) sedation was employed during this procedure. A total of Versed 1.0 mg and Fentanyl 25 mcg was administered intravenously. Moderate Sedation Time: 50 minutes. The patient's level of consciousness and vital signs were monitored continuously by radiology nursing throughout the procedure under my direct supervision. CONTRAST:  1 ISOVUE-300 IOPAMIDOL (ISOVUE-300) INJECTION 61%, 1 ISOVUE-300 IOPAMIDOL (ISOVUE-300) INJECTION 61% FLUOROSCOPY TIME:  Fluoroscopy Time: 14 minutes 12 seconds (144 mGy). COMPLICATIONS: None immediate. PROCEDURE: Informed consent was obtained from the patient following explanation of the procedure, risks, benefits and alternatives. The patient understands,  agrees and consents for the procedure. All questions were addressed. A time out was performed prior to the initiation of the procedure. Maximal barrier sterile technique utilized including caps, mask, sterile gowns, sterile gloves, large sterile drape, hand hygiene, and Betadine prep. Under sterile conditions and local anesthesia, ultrasound micropuncture access performed of the right common femoral artery. Five French sheath inserted. SOS catheter was utilized to select the celiac origin. Celiac angiogram performed. Celiac: Celiac origin is widely patent. Splenic, common hepatic, gastroduodenal artery, proper hepatic, right and left hepatic arteries are all patent. Irregularity of the hepatic vasculature is secondary to hepatomegaly and diffuse hepatic metastases.  No active bleeding demonstrated. On the venous phase, the splenic vein and portal vein are all patent. Through the [place] a 5 French catheter, a Renegade STC microcatheter was advanced over a micro glidewire into the proper hepatic artery. Proper hepatic angiogram: Proper hepatic, right and left hepatic arteries are patent. Vasospasm present in the proximal right hepatic artery related to wire manipulation. Limited assessment of the vasculature because of the contrast injection and poor opacification of the left hepatic artery. Over a double angled Glidewire, the microcatheter was advanced into the left hepatic artery. Left hepatic angiogram performed. Left hepatic: Left hepatic artery is patent. This is the dominant supply to the left hepatic lobe lateral segment were the percutaneous biopsy was performed. Peripheral irregularity of the arterial vasculature related to hepatic metastases. No active extravasation, acute bleeding, or other acute vascular abnormality including AV fistula or pseudoaneurysm. Because of the significant recent acute perihepatic hemorrhage and a biopsy performed in the left hepatic lobe lateral segment, Gel-Foam embolization of left hepatic artery will be performed. Left hepatic artery Gel-Foam embolization: Through the microcatheter, approximately 5 cc of Gel-Foam slurry was instilled into left hepatic artery. Following embolization, there is significant reduction in the left hepatic arterial peripheral vasculature and sluggish flow but the left hepatic main artery remains patent. Catheter was retracted and advanced into a left hepatic branch supplying the medial segment. Additional left hepatic angiogram performed. Additional left hepatic angiogram: Irregularity of the parenchymal staining related to diffuse hepatic metastases. Scattered peripheral occlusions present in the additional left hepatic branch related to the Gel-Foam embolization. Vasospasm noted at the catheter site. No active  bleeding demonstrated. Microcatheter was removed. A final angiogram was performed through the SOS 5 French catheter of the celiac origin. Celiac: Following left hepatic Gel-Foam embolization, there is significant reduction in the arterial vascularity to the the left hepatic lobe where the biopsy was performed. Residual vasospasm present in the proximal left hepatic artery. No other significant acute vascular process. Catheter was retracted and advanced into the SMA. SMA angiogram performed. SMA angiogram: SMA is widely patent. No variant SMA supply demonstrated to the liver. On the venous phase, the superior mesenteric vein and portal vein all remain patent. Access removed. Hemostasis obtained with manual compression of the right common femoral artery puncture site. Patient tolerated the procedure well. No immediate complication. IMPRESSION: Successful celiac, hepatic, and SMA angiograms as above. Successful micro catheterization, angiograms, and Gel-Foam embolization of the left hepatic artery resulting in significant reduction in arterial flow to the area the liver were the percutaneous biopsy was performed. Electronically Signed   By: Jerilynn Mages.  Shick M.D.   On: 01/19/2017 10:01   Ir Angiogram Selective Each Additional Vessel  Result Date: 01/19/2017 INDICATION: Acute abdominal pain with acute perihepatic hemorrhage 2 hours status post percutaneous left hepatic metastasis biopsy. EXAM: SELECTIVE VISCERAL ARTERIOGRAPHY; IR EMBO ART VEN HEMORR  LYMPH EXTRAV INC GUIDE ROADMAPPING; ADDITIONAL ARTERIOGRAPHY; IR ULTRASOUND GUIDANCE VASC ACCESS RIGHT MEDICATIONS: 1% lidocaine locally. ANESTHESIA/SEDATION: Moderate (conscious) sedation was employed during this procedure. A total of Versed 1.0 mg and Fentanyl 25 mcg was administered intravenously. Moderate Sedation Time: 50 minutes. The patient's level of consciousness and vital signs were monitored continuously by radiology nursing throughout the procedure under my direct  supervision. CONTRAST:  1 ISOVUE-300 IOPAMIDOL (ISOVUE-300) INJECTION 61%, 1 ISOVUE-300 IOPAMIDOL (ISOVUE-300) INJECTION 61% FLUOROSCOPY TIME:  Fluoroscopy Time: 14 minutes 12 seconds (144 mGy). COMPLICATIONS: None immediate. PROCEDURE: Informed consent was obtained from the patient following explanation of the procedure, risks, benefits and alternatives. The patient understands, agrees and consents for the procedure. All questions were addressed. A time out was performed prior to the initiation of the procedure. Maximal barrier sterile technique utilized including caps, mask, sterile gowns, sterile gloves, large sterile drape, hand hygiene, and Betadine prep. Under sterile conditions and local anesthesia, ultrasound micropuncture access performed of the right common femoral artery. Five French sheath inserted. SOS catheter was utilized to select the celiac origin. Celiac angiogram performed. Celiac: Celiac origin is widely patent. Splenic, common hepatic, gastroduodenal artery, proper hepatic, right and left hepatic arteries are all patent. Irregularity of the hepatic vasculature is secondary to hepatomegaly and diffuse hepatic metastases. No active bleeding demonstrated. On the venous phase, the splenic vein and portal vein are all patent. Through the [place] a 5 French catheter, a Renegade STC microcatheter was advanced over a micro glidewire into the proper hepatic artery. Proper hepatic angiogram: Proper hepatic, right and left hepatic arteries are patent. Vasospasm present in the proximal right hepatic artery related to wire manipulation. Limited assessment of the vasculature because of the contrast injection and poor opacification of the left hepatic artery. Over a double angled Glidewire, the microcatheter was advanced into the left hepatic artery. Left hepatic angiogram performed. Left hepatic: Left hepatic artery is patent. This is the dominant supply to the left hepatic lobe lateral segment were the  percutaneous biopsy was performed. Peripheral irregularity of the arterial vasculature related to hepatic metastases. No active extravasation, acute bleeding, or other acute vascular abnormality including AV fistula or pseudoaneurysm. Because of the significant recent acute perihepatic hemorrhage and a biopsy performed in the left hepatic lobe lateral segment, Gel-Foam embolization of left hepatic artery will be performed. Left hepatic artery Gel-Foam embolization: Through the microcatheter, approximately 5 cc of Gel-Foam slurry was instilled into left hepatic artery. Following embolization, there is significant reduction in the left hepatic arterial peripheral vasculature and sluggish flow but the left hepatic main artery remains patent. Catheter was retracted and advanced into a left hepatic branch supplying the medial segment. Additional left hepatic angiogram performed. Additional left hepatic angiogram: Irregularity of the parenchymal staining related to diffuse hepatic metastases. Scattered peripheral occlusions present in the additional left hepatic branch related to the Gel-Foam embolization. Vasospasm noted at the catheter site. No active bleeding demonstrated. Microcatheter was removed. A final angiogram was performed through the SOS 5 French catheter of the celiac origin. Celiac: Following left hepatic Gel-Foam embolization, there is significant reduction in the arterial vascularity to the the left hepatic lobe where the biopsy was performed. Residual vasospasm present in the proximal left hepatic artery. No other significant acute vascular process. Catheter was retracted and advanced into the SMA. SMA angiogram performed. SMA angiogram: SMA is widely patent. No variant SMA supply demonstrated to the liver. On the venous phase, the superior mesenteric vein and portal vein all remain patent. Access  removed. Hemostasis obtained with manual compression of the right common femoral artery puncture site.  Patient tolerated the procedure well. No immediate complication. IMPRESSION: Successful celiac, hepatic, and SMA angiograms as above. Successful micro catheterization, angiograms, and Gel-Foam embolization of the left hepatic artery resulting in significant reduction in arterial flow to the area the liver were the percutaneous biopsy was performed. Electronically Signed   By: Jerilynn Mages.  Shick M.D.   On: 01/19/2017 10:01   US Biopsy  Result Date: 01/18/2017 INDICATION: LIVER METASTASES, UNKNOWN PRIMARY. EXAM: ULTRASOUND BIOPSY CORE LIVER MEDICATIONS: 1% LIDOCAINE ANESTHESIA/SEDATION: Moderate (conscious) sedation was employed during this procedure. A total of Versed 1.0 mg and Fentanyl 50 mcg was administered intravenously. Moderate Sedation Time: 10 minutes. The patient's level of consciousness and vital signs were monitored continuously by radiology nursing throughout the procedure under my direct supervision. FLUOROSCOPY TIME:  Fluoroscopy Time: NONE. COMPLICATIONS: None immediate. PROCEDURE: Informed written consent was obtained from the patient after a thorough discussion of the procedural risks, benefits and alternatives. All questions were addressed. Maximal Sterile Barrier Technique was utilized including caps, mask, sterile gowns, sterile gloves, sterile drape, hand hygiene and skin antiseptic. A timeout was performed prior to the initiation of the procedure. Previous imaging reviewed. Preliminary ultrasound performed. A solid left hepatic lobe lesion was localized from a subxiphoid window. This was correlated with the abdominal MRI. Overlying skin was marked. Under sterile conditions and local anesthesia, a 17 gauge 6.8 cm access needle was advanced from an anterior subxiphoid approach into the left hepatic mass. Needle position confirmed with ultrasound. Three 1 cm 18 gauge core biopsies obtained. Samples placed in formalin. These were intact and non fragmented. Needle tract embolized with Gel-Foam.  Postprocedure imaging demonstrates no hemorrhage or hematoma. Patient tolerated the biopsy well. IMPRESSION: Successful ultrasound left hepatic mass 18 gauge core biopsy Electronically Signed   By: Jerilynn Mages.  Shick M.D.   On: 01/18/2017 14:53   Ir US Guide Vasc Access Right  Result Date: 01/19/2017 INDICATION: Acute abdominal pain with acute perihepatic hemorrhage 2 hours status post percutaneous left hepatic metastasis biopsy. EXAM: SELECTIVE VISCERAL ARTERIOGRAPHY; IR EMBO ART VEN HEMORR LYMPH EXTRAV INC GUIDE ROADMAPPING; ADDITIONAL ARTERIOGRAPHY; IR ULTRASOUND GUIDANCE VASC ACCESS RIGHT MEDICATIONS: 1% lidocaine locally. ANESTHESIA/SEDATION: Moderate (conscious) sedation was employed during this procedure. A total of Versed 1.0 mg and Fentanyl 25 mcg was administered intravenously. Moderate Sedation Time: 50 minutes. The patient's level of consciousness and vital signs were monitored continuously by radiology nursing throughout the procedure under my direct supervision. CONTRAST:  1 ISOVUE-300 IOPAMIDOL (ISOVUE-300) INJECTION 61%, 1 ISOVUE-300 IOPAMIDOL (ISOVUE-300) INJECTION 61% FLUOROSCOPY TIME:  Fluoroscopy Time: 14 minutes 12 seconds (144 mGy). COMPLICATIONS: None immediate. PROCEDURE: Informed consent was obtained from the patient following explanation of the procedure, risks, benefits and alternatives. The patient understands, agrees and consents for the procedure. All questions were addressed. A time out was performed prior to the initiation of the procedure. Maximal barrier sterile technique utilized including caps, mask, sterile gowns, sterile gloves, large sterile drape, hand hygiene, and Betadine prep. Under sterile conditions and local anesthesia, ultrasound micropuncture access performed of the right common femoral artery. Five French sheath inserted. SOS catheter was utilized to select the celiac origin. Celiac angiogram performed. Celiac: Celiac origin is widely patent. Splenic, common hepatic,  gastroduodenal artery, proper hepatic, right and left hepatic arteries are all patent. Irregularity of the hepatic vasculature is secondary to hepatomegaly and diffuse hepatic metastases. No active bleeding demonstrated. On the venous phase, the splenic vein and  portal vein are all patent. Through the [place] a 5 French catheter, a Renegade STC microcatheter was advanced over a micro glidewire into the proper hepatic artery. Proper hepatic angiogram: Proper hepatic, right and left hepatic arteries are patent. Vasospasm present in the proximal right hepatic artery related to wire manipulation. Limited assessment of the vasculature because of the contrast injection and poor opacification of the left hepatic artery. Over a double angled Glidewire, the microcatheter was advanced into the left hepatic artery. Left hepatic angiogram performed. Left hepatic: Left hepatic artery is patent. This is the dominant supply to the left hepatic lobe lateral segment were the percutaneous biopsy was performed. Peripheral irregularity of the arterial vasculature related to hepatic metastases. No active extravasation, acute bleeding, or other acute vascular abnormality including AV fistula or pseudoaneurysm. Because of the significant recent acute perihepatic hemorrhage and a biopsy performed in the left hepatic lobe lateral segment, Gel-Foam embolization of left hepatic artery will be performed. Left hepatic artery Gel-Foam embolization: Through the microcatheter, approximately 5 cc of Gel-Foam slurry was instilled into left hepatic artery. Following embolization, there is significant reduction in the left hepatic arterial peripheral vasculature and sluggish flow but the left hepatic main artery remains patent. Catheter was retracted and advanced into a left hepatic branch supplying the medial segment. Additional left hepatic angiogram performed. Additional left hepatic angiogram: Irregularity of the parenchymal staining related to  diffuse hepatic metastases. Scattered peripheral occlusions present in the additional left hepatic branch related to the Gel-Foam embolization. Vasospasm noted at the catheter site. No active bleeding demonstrated. Microcatheter was removed. A final angiogram was performed through the SOS 5 French catheter of the celiac origin. Celiac: Following left hepatic Gel-Foam embolization, there is significant reduction in the arterial vascularity to the the left hepatic lobe where the biopsy was performed. Residual vasospasm present in the proximal left hepatic artery. No other significant acute vascular process. Catheter was retracted and advanced into the SMA. SMA angiogram performed. SMA angiogram: SMA is widely patent. No variant SMA supply demonstrated to the liver. On the venous phase, the superior mesenteric vein and portal vein all remain patent. Access removed. Hemostasis obtained with manual compression of the right common femoral artery puncture site. Patient tolerated the procedure well. No immediate complication. IMPRESSION: Successful celiac, hepatic, and SMA angiograms as above. Successful micro catheterization, angiograms, and Gel-Foam embolization of the left hepatic artery resulting in significant reduction in arterial flow to the area the liver were the percutaneous biopsy was performed. Electronically Signed   By: Jerilynn Mages.  Shick M.D.   On: 01/19/2017 10:01   Dg Chest Port 1v Same Day  Result Date: 01/18/2017 CLINICAL DATA:  Acute onset of shortness of breath, vomiting and epigastric abdominal pain. Fever. Initial encounter. EXAM: PORTABLE CHEST 1 VIEW COMPARISON:  Chest radiograph performed 01/16/2017 FINDINGS: The lungs are hyperexpanded, with flattening of the hemidiaphragms, compatible with COPD. There is no evidence of focal opacification, pleural effusion or pneumothorax. The cardiomediastinal silhouette is within normal limits. No acute osseous abnormalities are seen. IMPRESSION: Findings of COPD.   Lungs remain otherwise clear. Electronically Signed   By: Garald Balding M.D.   On: 01/18/2017 23:48   Dg Abd Portable 1v  Result Date: 01/18/2017 CLINICAL DATA:  Acute onset of shortness of breath, vomiting and epigastric abdominal pain. Recent liver biopsy. Fever. Initial encounter. EXAM: PORTABLE ABDOMEN - 1 VIEW COMPARISON:  None. FINDINGS: The visualized bowel gas pattern is unremarkable. Scattered air and stool filled loops of colon are seen; no  abnormal dilatation of small bowel loops is seen to suggest small bowel obstruction. No free intra-abdominal air is identified, though evaluation for free air is limited on a single supine view. The stomach is largely filled with air. The visualized osseous structures are within normal limits; the sacroiliac joints are unremarkable in appearance. IMPRESSION: Unremarkable bowel gas pattern; no free intra-abdominal air seen. Moderate amount of stool noted in the colon. Electronically Signed   By: Garald Balding M.D.   On: 01/18/2017 23:50   [place] Guide Roadmapping  Result Date: 01/19/2017 INDICATION: Acute abdominal pain with acute perihepatic hemorrhage 2 hours status post percutaneous left hepatic metastasis biopsy. EXAM: SELECTIVE VISCERAL ARTERIOGRAPHY; IR EMBO ART VEN HEMORR LYMPH EXTRAV INC GUIDE ROADMAPPING; ADDITIONAL ARTERIOGRAPHY; IR ULTRASOUND GUIDANCE VASC ACCESS RIGHT MEDICATIONS: 1% lidocaine locally. ANESTHESIA/SEDATION: Moderate (conscious) sedation was employed during this procedure. A total of Versed 1.0 mg and Fentanyl 25 mcg was administered intravenously. Moderate Sedation Time: 50 minutes. The patient's level of consciousness and vital signs were monitored continuously by radiology nursing throughout the procedure under my direct supervision. CONTRAST:  1 ISOVUE-300 IOPAMIDOL (ISOVUE-300) INJECTION 61%, 1 ISOVUE-300 IOPAMIDOL (ISOVUE-300) INJECTION 61% FLUOROSCOPY TIME:  Fluoroscopy Time: 14 minutes 12  seconds (144 mGy). COMPLICATIONS: None immediate. PROCEDURE: Informed consent was obtained from the patient following explanation of the procedure, risks, benefits and alternatives. The patient understands, agrees and consents for the procedure. All questions were addressed. A time out was performed prior to the initiation of the procedure. Maximal barrier sterile technique utilized including caps, mask, sterile gowns, sterile gloves, large sterile drape, hand hygiene, and Betadine prep. Under sterile conditions and local anesthesia, ultrasound micropuncture access performed of the right common femoral artery. Five French sheath inserted. SOS catheter was utilized to select the celiac origin. Celiac angiogram performed. Celiac: Celiac origin is widely patent. Splenic, common hepatic, gastroduodenal artery, proper hepatic, right and left hepatic arteries are all patent. Irregularity of the hepatic vasculature is secondary to hepatomegaly and diffuse hepatic metastases. No active bleeding demonstrated. On the venous phase, the splenic vein and portal vein are all patent. Through the [place] a 5 French catheter, a Renegade STC microcatheter was advanced over a micro glidewire into the proper hepatic artery. Proper hepatic angiogram: Proper hepatic, right and left hepatic arteries are patent. Vasospasm present in the proximal right hepatic artery related to wire manipulation. Limited assessment of the vasculature because of the contrast injection and poor opacification of the left hepatic artery. Over a double angled Glidewire, the microcatheter was advanced into the left hepatic artery. Left hepatic angiogram performed. Left hepatic: Left hepatic artery is patent. This is the dominant supply to the left hepatic lobe lateral segment were the percutaneous biopsy was performed. Peripheral irregularity of the arterial vasculature related to hepatic metastases. No active extravasation, acute bleeding, or other acute vascular  abnormality including AV fistula or pseudoaneurysm. Because of the significant recent acute perihepatic hemorrhage and a biopsy performed in the left hepatic lobe lateral segment, Gel-Foam embolization of left hepatic artery will be performed. Left hepatic artery Gel-Foam embolization: Through the microcatheter, approximately 5 cc of Gel-Foam slurry was instilled into left hepatic artery. Following embolization, there is significant reduction in the left hepatic arterial peripheral vasculature and sluggish flow but the left hepatic main artery remains patent. Catheter was retracted and advanced into a left hepatic branch supplying the medial segment. Additional left hepatic angiogram performed. Additional left hepatic angiogram: Irregularity of the parenchymal  staining related to diffuse hepatic metastases. Scattered peripheral occlusions present in the additional left hepatic branch related to the Gel-Foam embolization. Vasospasm noted at the catheter site. No active bleeding demonstrated. Microcatheter was removed. A final angiogram was performed through the SOS 5 French catheter of the celiac origin. Celiac: Following left hepatic Gel-Foam embolization, there is significant reduction in the arterial vascularity to the the left hepatic lobe where the biopsy was performed. Residual vasospasm present in the proximal left hepatic artery. No other significant acute vascular process. Catheter was retracted and advanced into the SMA. SMA angiogram performed. SMA angiogram: SMA is widely patent. No variant SMA supply demonstrated to the liver. On the venous phase, the superior mesenteric vein and portal vein all remain patent. Access removed. Hemostasis obtained with manual compression of the right common femoral artery puncture site. Patient tolerated the procedure well. No immediate complication. IMPRESSION: Successful celiac, hepatic, and SMA angiograms as above. Successful micro catheterization, angiograms, and  Gel-Foam embolization of the left hepatic artery resulting in significant reduction in arterial flow to the area the liver were the percutaneous biopsy was performed. Electronically Signed   By: Jerilynn Mages.  Shick M.D.   On: 01/19/2017 10:01   Ct Angio Abd/pel W/ And/or W/o  Result Date: 01/20/2017 CLINICAL DATA:  Hepatic metastases, status post ultrasound percutaneous biopsy which was complicated by acute bleeding 2 hours after the biopsy. Patient subsequently underwent Gel-Foam embolization of left hepatic artery. Despite transfusions, further decrease in hemoglobin this morning. EXAM: CT ANGIOGRAPHY ABDOMEN AND PELVIS TECHNIQUE: Multidetector CT imaging of the abdomen and pelvis was performed using the standard protocol during bolus administration of intravenous contrast. Multiplanar reconstructed images including MIPs were obtained and reviewed to evaluate the vascular anatomy. CONTRAST:  100 cc Isovue COMPARISON:  01/18/2017 FINDINGS: Arterial findings: Aorta: Scattered moderate atherosclerosis of the aorta, most pronounced in the infrarenal aorta without occlusion, aneurysm or dissection. No retroperitoneal hemorrhage. Celiac axis: Atherosclerotic origin but remains patent. Splenic, left gastric, and hepatic branches appear patent. No active bleeding or acute vascular process. Superior mesenteric: Atherosclerotic origin but remains patent. SMA branches appear patent. No active bleeding. Left renal:           Mild atherosclerosis but widely patent. Right renal:          Mild atherosclerosis but widely patent. Inferior mesenteric:  Atherosclerotic origin but remains patent. Left iliac: Iliac atherosclerosis noted without occlusion. Left common, internal and external iliac arteries are patent. Right iliac: Similar atherosclerosis without occlusion. Right common, internal and external iliac arteries are patent. Venous findings: Hepatic veins, portal, splenic, mesenteric veins are patent. No veno-occlusive process. IVC,  renal veins common iliac veins are patent. Review of the MIP images confirms the above findings. Nonvascular findings: Basilar emphysema noted. Minor atelectasis. Normal heart size. Trace pleural effusions. No pericardial effusion. Hypodense peripherally enhancing hepatic masses again evident compatible with metastases. No evidence of active bleeding or acute vascular process within the liver. No biliary dilatation. Gallbladder is collapsed but contains partially calcified gallstones. Moderate Heterogeneous perihepatic free fluid, compatible with a mixture of ascites and known hemoperitoneum related to the recent left hepatic mass biopsy. Spleen is unremarkable. Small amount of hemoperitoneum about the spleen upper pole. Adrenal glands, kidneys, ureters, and bladder demonstrate no acute process, obstruction or hydronephrosis. Negative for bowel obstruction, significant dilatation, ileus, or free air. No adenopathy Remote hysterectomy.  Pelvic ascites also evident. No inguinal abnormality or hernia. Intact abdominal wall. No rectus sheath hemorrhage or hematoma over the left hepatic  biopsy site. Degenerative changes of the spine. Bones are mildly osteopenic. No acute osseous finding. IMPRESSION: Negative for acute or active intra-abdominal bleeding. Stable diffuse hepatic metastatic process. Stable perihepatic ascites and hemoperitoneum related to the recent left hepatic mass biopsy. Intact abdominal wall without rectus sheath hematoma or hemorrhage. Aortoiliac atherosclerosis Cholelithiasis Pelvic ascites as well These results were called by telephone at the time of interpretation on 01/20/2017 at 2:13 pm to Dr. Florencia Reasons , who verbally acknowledged these results. Electronically Signed   By: Jerilynn Mages.  Shick M.D.   On: 01/20/2017 14:15   US Abdomen Limited Ruq  Result Date: 01/18/2017 CLINICAL DATA:  61 year old female with a history of multiple liver masses, status post percutaneous biopsy today's date. Interval  development of abdominal pain EXAM: US ABDOMEN LIMITED - RIGHT UPPER QUADRANT COMPARISON:  Ultrasound 01/18/2017, MRI 01/15/2017 FINDINGS: Gallbladder: Contracted gallbladder with echogenic focus in the lumen compatible with cholelithiasis. Common bile duct: Diameter: 4 mm -5 mm Liver: Re- demonstration of mass within right liver lobe 7 point far cm by 5.4 cm x 7.4 cm. The biopsy of the left liver lobe on the comparison ultrasound today's date demonstrates small echogenic focus, compatible with hemostatic material. There is hyperechoic free fluid adjacent to the liver margin, both anterior and posterior to the left liver lobe. IMPRESSION: New hyperechoic free fluid adjacent to the left liver lobe, status post percutaneous biopsy, compatible with hemorrhage. Re- demonstration of liver masses, better characterized on prior MRI. These results were called by telephone at the time of interpretation on 01/18/2017 at 5:42 pm to Dr. Annamaria Boots who verbally acknowledged these results. Electronically Signed   By: Corrie Mckusick D.O.   On: 01/18/2017 17:43    Labs:  CBC:  Recent Labs  01/19/17 1830 01/20/17 0511 01/20/17 1127 01/21/17 0352 01/22/17 0450  WBC 33.9* 32.8*  --  26.6* 24.5*  HGB 7.6* 6.9* 8.4* 8.4* 8.5*  HCT 25.6* 22.8* 27.5* 27.2* 28.3*  PLT 683* 584*  --  455* 464*    COAGS:  Recent Labs  01/18/17 0005 01/18/17 1134  INR 1.27 1.11  APTT 32 36    BMP:  Recent Labs  01/19/17 0639 01/20/17 0511 01/21/17 0352 01/22/17 0450  NA 138 138 135 135  K 3.5 3.8 3.5 4.0  CL 94* 95* 94* 94*  CO2 31 34* 33* 30  GLUCOSE 144* 111* 88 87  BUN 7 6 <5* <5*  CALCIUM 8.3* 8.8* 8.4* 8.4*  CREATININE 0.70 0.35* 0.37* 0.36*  GFRNONAA >60 >60 >60 >60  GFRAA >60 >60 >60 >60    LIVER FUNCTION TESTS:  Recent Labs  01/19/17 0639 01/20/17 0511 01/21/17 0352 01/22/17 0450  BILITOT 0.4 0.3 0.4 0.6  AST 50* 56* 37 31  ALT 15 20 17 16   ALKPHOS 148* 138* 130* 134*  PROT 6.7 6.3* 5.8* 6.0*    ALBUMIN 1.9* 1.9* 1.7* 1.7*    Assessment and Plan:  Liver bx 2/23 Post bleed/hemorrhage Embolization in IR immediately Stable  Plan per [facility] and Dr Silvio Pate  Electronically Signed: Monia Sabal A 01/22/2017, 10:36 AM   I spent a total of 15 Minutes at the the patient's bedside AND on the patient's hospital floor or unit, greater than 50% of which was counseling/coordinating care for liver bx with post bleed/ embolization

## 2017-01-22 NOTE — Progress Notes (Signed)
SATURATION QUALIFICATIONS: (This note is used to comply with regulatory documentation for home oxygen)  Patient Saturations on Room Air at Rest = 79%  Patient Saturations on Room Air while Ambulating = 75%    Please briefly explain why patient needs home oxygen: Pt desats to 70s without 02. Pt on 2.5L-3L sats 100%

## 2017-01-22 NOTE — Consult Note (Signed)
[facility] for Infectious Disease  Total days of antibiotics 5        Day 2 cipro/metro               Reason for Consult: endocarditis   Referring Physician: Erlinda Hong  Principal Problem:   Sepsis ([place]) Active Problems:   Nicotine dependence   Lumbar disc disease   Chronic pain   Liver lesion    HPI: Rachel Butler is a 61 y.o. female who has hx of DJD, chronic pain syndrome on opiates, smoking, who started to develop right sided abdominal pain in setting of 20 lb unintentional weight loss, intermittent low grade fevers in the last 6 months. She was seen by her PCP who did an [place] that showed multiple liver lesions concerning for malignancy. She was scheduled for a liver lesion biopsy by IR on 6/60 which was complicated post procedurally by fevers, hypotension (possibly due to vasovagal response) . She had cultures drawn and started on abtx of vancomycin and aztreonam due to concern for possible hepatic abscess. Her course was further complicated since she had post procedure bleeding/acute perihepatic hemorrhage, which required L hepatic artery embolization. Her abtx changed to cipro and metro for hepatobiliary source  on 2/25 since initial blood cx were NGTD. 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 Pt no hx of having mammo. Further work done to try to find primary including a chest CTA which may have showed one focal filling defect then had TTE which showed evidence of MV severe regurgitation with possible vegetation to anterior mitral valve. ID consulted to weigh in if she could have concominant bacterial endocarditis vs all related to malignancy  She states that she has occasional breast pain, has hx of dense breast, no family hx of breast ca. Mother had lung ca. She had colonoscopy in 2016 for colorectal screening which was negative. Risk factor smoking+  Past Medical History:  Diagnosis Date  . Cervical disc disease   . Lumbar disc disease   . Migraines    . Nicotine dependence   . UTI (urinary tract infection)     Allergies:  Allergies  Allergen Reactions  . Endoxcin [Lidocaine-Menthol]   . Nubain [Nalbuphine Hcl]   . Penicillins   . Robaxin [Methocarbamol]    MEDICATIONS: . sodium chloride   Intravenous Once  . acetaminophen  650 mg Oral Once  . ciprofloxacin  500 mg Oral BID  . diphenhydrAMINE  25 mg Intravenous Once  . fentaNYL  25 mcg Transdermal Q72H  . gabapentin  400 mg Oral TID  . metroNIDAZOLE  500 mg Oral Q8H  . polyethylene glycol  17 g Oral Daily  . senna-docusate  2 tablet Oral QHS  . sodium chloride flush  3 mL Intravenous Q12H  . sodium chloride flush  3 mL Intravenous Q12H    Social History  Substance Use Topics  . Smoking status: Current Every Day Smoker    Years: 0.00    Types: Cigarettes  . Smokeless tobacco: Never Used  . Alcohol use No    Family History  Problem Relation Age of Onset  . Cancer Mother   . COPD Father   . Heart disease Father   . Hypertension Father   . Thyroid disease Other   . Cancer Other     mother's side  . Heart disease Other     Dad's side  . Colon cancer Neg Hx      Review of Systems  Constitutional:  positive  For low grade fever, and weight loss, chills, diaphoresis, activity change, appetite change, fatigue  HENT: Negative for congestion, sore throat, rhinorrhea, sneezing, trouble swallowing and sinus pressure.  Eyes: Negative for photophobia and visual disturbance.  Respiratory: Negative for cough, chest tightness, shortness of breath, wheezing and stridor.  Cardiovascular: Negative for chest pain, palpitations and leg swelling.  Gastrointestinal: +constipation. + abdominal distention and some pain. Negative for nausea, vomiting,  diarrhea,  blood in stool,  and anal bleeding.  Genitourinary: Negative for dysuria, hematuria, flank pain and difficulty urinating.  Musculoskeletal: Negative for myalgias, back pain, joint swelling, arthralgias and gait problem.    Skin: Negative for color change, pallor, rash and wound.  Neurological: Negative for dizziness, tremors, weakness and light-headedness.  Hematological: Negative for adenopathy. Does not bruise/bleed easily.  Psychiatric/Behavioral: Negative for behavioral problems, confusion, sleep disturbance, dysphoric mood, decreased concentration and agitation.     OBJECTIVE: Temp:  [98.7 F (37.1 C)-99.8 F (37.7 C)] 98.7 F (37.1 C) (02/27 1050) Pulse Rate:  [87-120] 94 (02/27 0246) Resp:  [10-28] 10 (02/27 0246) BP: (116-137)/(73-81) 137/75 (02/27 1050) SpO2:  [98 %-99 %] 99 % (02/27 0246) Weight:  [104 lb 14.4 oz (47.6 kg)] 104 lb 14.4 oz (47.6 kg) (02/27 0241) Physical Exam  Constitutional:  oriented to person, place, and time. appears well-developed and well-nourished. No distress.  HENT: [place]/AT, PERRLA, no scleral icterus Mouth/Throat: Oropharynx is clear and moist. No oropharyngeal exudate.  Cardiovascular: Normal rate, regular rhythm and normal heart sounds. Exam reveals no gallop and no friction rub.  Soft 2/6 M BHapex Pulmonary/Chest: Effort normal and breath sounds normal. No respiratory distress.  has no wheezes.  Neck = supple, no nuchal rigidity Abdominal: Soft. Bowel sounds are normal.  exhibits mild distension. There is mild tenderness.  Lymphadenopathy: no cervical adenopathy. No axillary adenopathy Neurological: alert and oriented to person, place, and time.  Skin: Skin is warm and dry. No rash noted. No erythema.onychomycosis of toe nails bilaterally. No stigmata of endocarditis  Psychiatric: a normal mood and affect.  behavior is normal.   LABS: Results for orders placed or performed during the hospital encounter of 01/18/17 (from the past 48 hour(s))  HIV antibody     Status: None   Collection Time: 01/21/17  3:52 AM  Result Value Ref Range   HIV Screen 4th Generation wRfx Non Reactive Non Reactive    Comment: (NOTE) Performed At: [facility] [place], [place] [id number] Lindon Romp MD SW:[id number]   CBC with Differential/Platelet     Status: Abnormal   Collection Time: 01/21/17  3:52 AM  Result Value Ref Range   WBC 26.6 (H) 4.0 - 10.5 K/uL   RBC 3.17 (L) 3.87 - 5.11 MIL/uL   Hemoglobin 8.4 (L) 12.0 - 15.0 g/dL   HCT 27.2 (L) 36.0 - 46.0 %   MCV 85.8 78.0 - 100.0 fL   MCH 26.5 26.0 - 34.0 pg   MCHC 30.9 30.0 - 36.0 g/dL   RDW 17.1 (H) 11.5 - 15.5 %   Platelets 455 (H) 150 - 400 K/uL   Neutrophils Relative % 86 %   Lymphocytes Relative 7 %   Monocytes Relative 7 %   Eosinophils Relative 0 %   Basophils Relative 0 %   Neutro Abs 22.8 (H) 1.7 - 7.7 K/uL   Lymphs Abs 1.9 0.7 - 4.0 K/uL   Monocytes Absolute 1.9 (H) 0.1 - 1.0 K/uL   Eosinophils Absolute 0.0 0.0 - 0.7  K/uL   Basophils Absolute 0.0 0.0 - 0.1 K/uL   RBC Morphology POLYCHROMASIA PRESENT   Comprehensive metabolic panel     Status: Abnormal   Collection Time: 01/21/17  3:52 AM  Result Value Ref Range   Sodium 135 135 - 145 mmol/L   Potassium 3.5 3.5 - 5.1 mmol/L   Chloride 94 (L) 101 - 111 mmol/L   CO2 33 (H) 22 - 32 mmol/L   Glucose, Bld 88 65 - 99 mg/dL   BUN <5 (L) 6 - 20 mg/dL   Creatinine, Ser 0.37 (L) 0.44 - 1.00 mg/dL   Calcium 8.4 (L) 8.9 - 10.3 mg/dL   Total Protein 5.8 (L) 6.5 - 8.1 g/dL   Albumin 1.7 (L) 3.5 - 5.0 g/dL   AST 37 15 - 41 U/L   ALT 17 14 - 54 U/L   Alkaline Phosphatase 130 (H) 38 - 126 U/L   Total Bilirubin 0.4 0.3 - 1.2 mg/dL   GFR calc non Af Amer >60 >60 mL/min   GFR calc Af Amer >60 >60 mL/min    Comment: (NOTE) The eGFR has been calculated using the CKD EPI equation. This calculation has not been validated in all clinical situations. eGFR's persistently <60 mL/min signify possible Chronic Kidney Disease.    Anion gap 8 5 - 15  CBC with Differential/Platelet     Status: Abnormal   Collection Time: 01/22/17  4:50 AM  Result Value Ref Range   WBC 24.5 (H) 4.0 - 10.5 K/uL   RBC 3.29 (L) 3.87 - 5.11 MIL/uL    Hemoglobin 8.5 (L) 12.0 - 15.0 g/dL   HCT 28.3 (L) 36.0 - 46.0 %   MCV 86.0 78.0 - 100.0 fL   MCH 25.8 (L) 26.0 - 34.0 pg   MCHC 30.0 30.0 - 36.0 g/dL   RDW 17.5 (H) 11.5 - 15.5 %   Platelets 464 (H) 150 - 400 K/uL   Neutrophils Relative % 81 %   Lymphocytes Relative 10 %   Monocytes Relative 9 %   Eosinophils Relative 0 %   Basophils Relative 0 %   Neutro Abs 19.8 (H) 1.7 - 7.7 K/uL   Lymphs Abs 2.5 0.7 - 4.0 K/uL   Monocytes Absolute 2.2 (H) 0.1 - 1.0 K/uL   Eosinophils Absolute 0.0 0.0 - 0.7 K/uL   Basophils Absolute 0.0 0.0 - 0.1 K/uL   RBC Morphology POLYCHROMASIA PRESENT   Comprehensive metabolic panel     Status: Abnormal   Collection Time: 01/22/17  4:50 AM  Result Value Ref Range   Sodium 135 135 - 145 mmol/L   Potassium 4.0 3.5 - 5.1 mmol/L   Chloride 94 (L) 101 - 111 mmol/L   CO2 30 22 - 32 mmol/L   Glucose, Bld 87 65 - 99 mg/dL   BUN <5 (L) 6 - 20 mg/dL   Creatinine, Ser 0.36 (L) 0.44 - 1.00 mg/dL   Calcium 8.4 (L) 8.9 - 10.3 mg/dL   Total Protein 6.0 (L) 6.5 - 8.1 g/dL   Albumin 1.7 (L) 3.5 - 5.0 g/dL   AST 31 15 - 41 U/L   ALT 16 14 - 54 U/L   Alkaline Phosphatase 134 (H) 38 - 126 U/L   Total Bilirubin 0.6 0.3 - 1.2 mg/dL   GFR calc non Af Amer >60 >60 mL/min   GFR calc Af Amer >60 >60 mL/min    Comment: (NOTE) The eGFR has been calculated using the CKD EPI equation. This calculation has not  been validated in all clinical situations. eGFR's persistently <60 mL/min signify possible Chronic Kidney Disease.    Anion gap 11 5 - 15    MICRO: 2/23 blood x 2 ngtd 2/23 blood cx x2 ngtd IMAGING: Ct Angio Chest Pe W Or Wo Contrast  Result Date: 01/22/2017 CLINICAL DATA:  Hypoxia. EXAM: CT ANGIOGRAPHY CHEST WITH CONTRAST TECHNIQUE: Multidetector CT imaging of the chest was performed using the standard protocol during bolus administration of intravenous contrast. Multiplanar CT image reconstructions and MIPs were obtained to evaluate the vascular anatomy.  CONTRAST:  80 mL Isovue 370 COMPARISON:  One-view chest x-ray 01/18/2017. CT of the abdomen and pelvis 01/20/2017 FINDINGS: Cardiovascular: The heart size is normal. The pulmonary arteries demonstrate excellent opacification. There is focal filling defect to suggest pulmonary embolus. Pulmonary arterial size is normal. The aorta demonstrates atherosclerotic change without aneurysm. A 4 vessel arch configuration is present. The left vertebral artery Mediastinum/Nodes: No significant mediastinal or axillary adenopathy is present. The esophagus is unremarkable. Secretions versus small tracheal nodules are evident. Lungs/Pleura: Centrilobular emphysema is present. No focal nodule or mass lesion is present. Mild dependent atelectasis is present bilaterally. There are small bilateral pleural effusions. Upper Abdomen: Liver metastases are again noted. Musculoskeletal: No focal lytic or blastic lesions are present. Vertebral body heights alignment are maintained. Review of the MIP images confirms the above findings. IMPRESSION: 1. No pulmonary embolus. 2. Centrilobular emphysema. 3. Secretions versus small tracheal nodules. Short-term follow-up CT scan of or bronchoscopy would be useful for further evaluation. 4. Aortic atherosclerosis. 5. Liver metastases. Electronically Signed   By: San Morelle M.D.   On: 01/22/2017 09:53   Assessment/Plan:  61 yo F with constellation of multiple liver lesions, unexpected weight loss, fevers found to have metastatic adenocarcinoma. She has coincidentally also found to have mitral valve vegetations. Currently on cipro and metro for hepatobiliary source of infection since she became acutely ill after getting hepatic lesion biopsy.  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  - despite being on abtx for 5 days, leukocytosis and intermittent fevers are unchanged which I think also makes sense with advanced malignancy - please have  cardiology weigh in  to do/ no downside to getting TEE to evaluate her mitral valve and whether she would need anticoagulation since vegetations can easily embolize - since she is hemodynamically stable, recommend to stop abtx and culture off of abtx

## 2017-01-22 NOTE — Consult Note (Signed)
[place] Nurse wound consult note Reason for Consult: Consult requested for buttocks.  Pt states she had a scratch to her buttocks which was present prior to admission, but has declined "since she is not moving around as much as at home." Wound type: Partial thickness abrasion to inner buttocks Pressure Injury POA: This is NOT a pressure injury, and was present on admission Measurement: .5X.3X.1cm Wound bed: dark red woundbed Drainage (amount, consistency, odor) no odor or drainage Dressing procedure/placement/frequency: Foam dressing to protect and promote healing.  Discussed plan of care with patient and she verbalized understanding. Please re-consult if further assistance is needed.  Thank-you,  Julien Girt MSN, [place], [place], [place], [place]

## 2017-01-22 NOTE — Progress Notes (Addendum)
PROGRESS NOTE  Rachel Butler [id number] DOB: 04-26-1956 DOA: 01/18/2017 PCP: Viviana Simpler, MD  HPI/Recap of past 24 hours:  Sitting up in chair, report feeling better, ab pain has improved,  no overt bleeding, tmax last 24hr 99.2, remain slight sinus tachycardia, remain oxygen dependent Husband  at bedside  Assessment/Plan: Principal Problem:   Sepsis ([place]) Active Problems:   Nicotine dependence   Lumbar disc disease   Chronic pain   Liver lesion  Sepsis presented on admission with hypotension, fever 101, tachycardia, leukocytosis, lactic acidosis She is admitted to stepdown unit.  cxr + copd but no acute findings, blood culture no growth, ua with concentrated urine but no infection, source of infection likely hepatobiliary She is started on vanc/aztreonam ( h/o pcn allergy) ,she received ivf,  Improving. mrsa screen negative, d/c vanc, d/c aztroenam. Change abx to cipro and flagyl to over hepatobiliary source. Fever could also from maligancy echocardiogram with mitral vale vegetation and mitral valve regurg, culture negative endocarditis vs libman sacks in the setting of malignance? Infectious disease and cardiology formally consulted  Acute perihepatic hemorrhage after left hepatic mass core biopsy: s/p prbc transfusion, s/p S/p visceral angios and left hepatic artery gelfoam embo on 2/23 Due to drop of hgb, CAT agio ab/pel recommended by IR Dr Annamaria Boots on 2/25, CTA neg for acute or active bleeding in the abdomen or pelvis hgb stabilized IR following  Bleeding in right groin s/p angio Bleed stopped after Pressure held to right groin times 30 minutes , dressing reinforced No more bleed  Acute blood loss anemia: s/p prbc transfusionx2, hgb stable.  Iron deficiency anemia: will need iron supplement if infection under control. FOBT ordered, pending collection.  Leukocytosis/thrombocytosis: likely reactive. monitor  Hypokalemia: replace k, check  mag  Hypoxia/tachycardia: CTA no PE, arrange home o2.  Multiple Liver Lesions: high suspicion for a malignancy (HCC vs cholangiocarcinoma)-await bx results. Long hx of tobacco use-did have colonoscopy (2016-Dr Pyrtle)-this was negative.  AFPwnl ,cea mildly elevated at 6.9,  ldh mildly elevated at  276 hepatitis panel negative for hepA/B, hcv ab 0.2, will check hepc viral load, hiv negative Biopsy adenocarcinoma , likely breast primary. Patient report she never had memogram before. I have contacted oncologist Dr Julien Nordmann who will arrange oncology follow up.  Chronic Back Pain with chronic narcotic dependence: follows with pain medicine at [facility] held briefly due to initial hypotension-resumed  COPD: stable, no wheezing, no cough  Nicotine dependence:. Smoking cessation education   Diet : resume diet  Code Status: FULL  Family Communication: patient and husband at bedside  Disposition Plan: pending, need cardiology and ID clearance, eventually home with home health and home oxygen   Consultants:  IR  Infectious disease  cardiology  Procedures:  left hepatic mass core biopsy on 2/23  isceral angios and left hepatic artery gelfoam embo on 2/23 prbc transfusion on 2/23 and on 2/25   Antibiotics:  vanc from admission to 2/25  aztreonam from admission to 2/26  Cipro/flagyl from 2/26   Objective: BP 135/82 (BP Location: Left Arm)   Pulse (!) 105   Temp 99.1 F (37.3 C) (Oral)   Resp (!) 9   Ht 5\' 3"  (1.6 m)   Wt 47.6 kg (104 lb 14.4 oz)   SpO2 100%   BMI 18.58 kg/m   Intake/Output Summary (Last 24 hours) at 01/22/17 2308 Last data filed at 01/22/17 2021  Gross per 24 hour  Intake  1080 ml  Output              100 ml  Net              980 ml   Filed Weights   01/20/17 0443 01/21/17 0500 01/22/17 0241  Weight: 45.9 kg (101 lb 4.8 oz) 47.4 kg (104 lb 6.4 oz) 47.6 kg (104 lb 14.4 oz)    Exam:   General:  NAD  Cardiovascular:  tachycardia has improved  Respiratory: CTABL  Abdomen: less tender right upper quadrant, no rebound,positive BS  Musculoskeletal: No Edema  Neuro: aaox3, flat affect   Data Reviewed: Basic Metabolic Panel:  Recent Labs Lab 01/18/17 1614 01/19/17 0639 01/20/17 0511 01/21/17 0352 01/22/17 0450  NA 137 138 138 135 135  K 2.7* 3.5 3.8 3.5 4.0  CL 87* 94* 95* 94* 94*  CO2 34* 31 34* 33* 30  GLUCOSE 146* 144* 111* 88 87  BUN 5* 7 6 <5* <5*  CREATININE 0.86 0.70 0.35* 0.37* 0.36*  CALCIUM 8.7* 8.3* 8.8* 8.4* 8.4*  MG  --   --  1.9  --   --    Liver Function Tests:  Recent Labs Lab 01/18/17 1614 01/19/17 0639 01/20/17 0511 01/21/17 0352 01/22/17 0450  AST 31 50* 56* 37 31  ALT 12* 15 20 17 16   ALKPHOS 186* 148* 138* 130* 134*  BILITOT 0.7 0.4 0.3 0.4 0.6  PROT 7.3 6.7 6.3* 5.8* 6.0*  ALBUMIN 2.0* 1.9* 1.9* 1.7* 1.7*   No results for input(s): LIPASE, AMYLASE in the last 168 hours. No results for input(s): AMMONIA in the last 168 hours. CBC:  Recent Labs Lab 01/19/17 0631 01/19/17 1137 01/19/17 1830 01/20/17 0511 01/20/17 1127 01/21/17 0352 01/22/17 0450  WBC 32.2* 33.7* 33.9* 32.8*  --  26.6* 24.5*  NEUTROABS 27.6*  --   --  28.9*  --  22.8* 19.8*  HGB 8.2* 8.1* 7.6* 6.9* 8.4* 8.4* 8.5*  HCT 27.0* 26.7* 25.6* 22.8* 27.5* 27.2* 28.3*  MCV 84.1 84.2 84.8 85.7  --  85.8 86.0  PLT 770* 712* 683* 584*  --  455* 464*   Cardiac Enzymes:    Recent Labs Lab 01/18/17 1558 01/19/17 0639  TROPONINI 0.03* 0.03*   BNP (last 3 results) No results for input(s): BNP in the last 8760 hours.  ProBNP (last 3 results) No results for input(s): PROBNP in the last 8760 hours.  CBG: No results for input(s): GLUCAP in the last 168 hours.  Recent Results (from the past 240 hour(s))  Culture, blood (Routine X 2) w Reflex to ID Panel     Status: None (Preliminary result)   Collection Time: 01/18/17  5:50 PM  Result Value Ref Range Status   Specimen Description BLOOD  LEFT ANTECUBITAL  Final   Special Requests BOTTLES DRAWN AEROBIC AND ANAEROBIC 4CC  Final   Culture NO GROWTH 4 DAYS  Final   Report Status PENDING  Incomplete  Culture, blood (Routine X 2) w Reflex to ID Panel     Status: None (Preliminary result)   Collection Time: 01/18/17  6:12 PM  Result Value Ref Range Status   Specimen Description BLOOD LEFT HAND  Final   Special Requests BOTTLES DRAWN AEROBIC AND ANAEROBIC 3CC  Final   Culture NO GROWTH 4 DAYS  Final   Report Status PENDING  Incomplete  MRSA PCR Screening     Status: None   Collection Time: 01/18/17  8:50 PM  Result Value Ref Range Status  MRSA by PCR NEGATIVE NEGATIVE Final    Comment:        The GeneXpert MRSA Assay (FDA approved for NASAL specimens only), is one component of a comprehensive MRSA colonization surveillance program. It is not intended to diagnose MRSA infection nor to guide or monitor treatment for MRSA infections.   Culture, blood (x 2)     Status: None (Preliminary result)   Collection Time: 01/18/17 11:51 PM  Result Value Ref Range Status   Specimen Description BLOOD RIGHT ARM  Final   Special Requests BOTTLES DRAWN AEROBIC AND ANAEROBIC 3ML  Final   Culture NO GROWTH 3 DAYS  Final   Report Status PENDING  Incomplete  Culture, blood (x 2)     Status: None (Preliminary result)   Collection Time: 01/18/17 11:51 PM  Result Value Ref Range Status   Specimen Description BLOOD RIGHT ARM  Final   Special Requests BOTTLES DRAWN AEROBIC AND ANAEROBIC 3ML  Final   Culture NO GROWTH 3 DAYS  Final   Report Status PENDING  Incomplete  Culture, Urine     Status: None   Collection Time: 01/19/17  6:25 AM  Result Value Ref Range Status   Specimen Description URINE, RANDOM  Final   Special Requests NONE  Final   Culture NO GROWTH  Final   Report Status 01/20/2017 FINAL  Final     Studies: Ct Angio Chest Pe W Or Wo Contrast  Result Date: 01/22/2017 CLINICAL DATA:  Hypoxia. EXAM: CT ANGIOGRAPHY CHEST WITH  CONTRAST TECHNIQUE: Multidetector CT imaging of the chest was performed using the standard protocol during bolus administration of intravenous contrast. Multiplanar CT image reconstructions and MIPs were obtained to evaluate the vascular anatomy. CONTRAST:  80 mL Isovue 370 COMPARISON:  One-view chest x-ray 01/18/2017. CT of the abdomen and pelvis 01/20/2017 FINDINGS: Cardiovascular: The heart size is normal. The pulmonary arteries demonstrate excellent opacification. There is focal filling defect to suggest pulmonary embolus. Pulmonary arterial size is normal. The aorta demonstrates atherosclerotic change without aneurysm. A 4 vessel arch configuration is present. The left vertebral artery Mediastinum/Nodes: No significant mediastinal or axillary adenopathy is present. The esophagus is unremarkable. Secretions versus small tracheal nodules are evident. Lungs/Pleura: Centrilobular emphysema is present. No focal nodule or mass lesion is present. Mild dependent atelectasis is present bilaterally. There are small bilateral pleural effusions. Upper Abdomen: Liver metastases are again noted. Musculoskeletal: No focal lytic or blastic lesions are present. Vertebral body heights alignment are maintained. Review of the MIP images confirms the above findings. IMPRESSION: 1. No pulmonary embolus. 2. Centrilobular emphysema. 3. Secretions versus small tracheal nodules. Short-term follow-up CT scan of or bronchoscopy would be useful for further evaluation. 4. Aortic atherosclerosis. 5. Liver metastases. Electronically Signed   By: San Morelle M.D.   On: 01/22/2017 09:53    Scheduled Meds: . sodium chloride   Intravenous Once  . acetaminophen  650 mg Oral Once  . ciprofloxacin  500 mg Oral BID  . diphenhydrAMINE  25 mg Intravenous Once  . fentaNYL  25 mcg Transdermal Q72H  . gabapentin  400 mg Oral TID  . metroNIDAZOLE  500 mg Oral Q8H  . polyethylene glycol  17 g Oral Daily  . senna-docusate  2 tablet Oral  QHS  . sodium chloride flush  3 mL Intravenous Q12H  . sodium chloride flush  3 mL Intravenous Q12H    Continuous Infusions:    Time spent: 1mins  Lydon Vansickle MD, PhD  [facility] Pager [phone]. If  7PM-7AM, please contact night-coverage at [email or web address], password [facility] 01/22/2017, 11:08 PM  LOS: 4 days

## 2017-01-22 NOTE — Progress Notes (Signed)
RN tried to wean pt down to RA. Pt sats in the 35s. Pt notified MD of sats, and also notified CM

## 2017-01-22 NOTE — Progress Notes (Deleted)
Hematology/Oncology Consult note [facility] Telephone:([phone] Fax:([phone]) [phone]  Patient Care Team: Venia Carbon, MD as PCP - General (Pediatrics)   Name of the patient: Rachel Butler  [id number]  06/09/1956    Reason for referral- newly diagnosed metastatic breast cancer   Referring physician: Dr. Rosemarie Beath  Date of visit:    History of presenting illness- 1. Patient is a 61 year old female who presented with symptoms of right upper quadrant abdominal pain and underwent ultrasound on 01/11/2017.  2. Ultrasound showed multiple gallstones. Multiple solid hepatic lesions area and the largest measures 9.7 cm. Pancreatic mass appears to be present. Pancreatic solid lesions noted most likely adenopathy. Portal vein is narrowed possibly from periportal adenopathy.  3. MRI of the abdomen on 01/15/2017 showed enlargement of the liver and contains multiple ill-defined heterogeneous and peripheral enhancing lesions worrisome for multifocal metastatic disease. The largest lesion is in the medial segment of the left lobe measuring 6 cm. Lesion within segment 8 measures 3.2 cm segment 5 lesion measures 3.1 cm. The gallbladder fundus extends into the large mass within the medial segment of the left lobe. No biliary dilation. No inflammation or mass in the pancreas. Enlarged portocaval and porta hepatic lymph nodes worrisome for metastatic adenopathy.  4. Patient underwent ultrasound-guided liver biopsy which showed metastatic adenocarcinoma. Malignant cells are positive for CK 7 and GATA 3 as well as focal positive for estrogen. ER 40% positive PR negative. Cells are negative for CK 20, CDX2, TTF1, PAX8, napsin A, p53, WT 1 This is favored to represent breast primary  5.  Post liver biopsy patient had acute bleeding for about 2 hours and underwent Gelfoam embolization of the left hepatic artery. CTA of the abdomen did not reveal any acute or active intra-abdominal  bleeding. Stable diffuse hepatic metastatic process. Stable perihepatic ascites and hemoperitoneum related to recent left hepatic mass biopsy. Intact abdominal wall without rectus sheath hematoma or hemorrhage. Pelvic ascites. Pelvic ascites. 6.  CTA of the chest showed no pulmonary embolus. Centrilobular emphysema. Secretions versus small pretracheal nodules. Short-term follow-up CT or bronchoscopy would be useful for further evaluation.    ECOG PS- ***  Pain scale- ***   Review of systems- ROS  Allergies  Allergen Reactions  . Endoxcin [Lidocaine-Menthol]   . Nubain [Nalbuphine Hcl]   . Penicillins   . Robaxin [Methocarbamol]     Patient Active Problem List   Diagnosis Date Noted  . Liver lesion 01/18/2017  . Sepsis ([place]) 01/18/2017  . Liver metastases ([place]) 01/16/2017  . RUQ abdominal pain 01/09/2017  . Intermittent claudication ([place]) 01/09/2017  . Neuropathy ([place]) 12/29/2015  . Chronic diastolic heart failure ([place]) 08/22/2015  . Generalized edema 08/17/2015  . Preventative health care 12/20/2014  . Advance directive discussed with patient 12/20/2014  . Mild malnutrition ([place]) 12/07/2014  . Chronic pain 12/05/2014  . Nicotine dependence   . Cervical disc disease   . Lumbar disc disease   . Migraines      Past Medical History:  Diagnosis Date  . Cervical disc disease   . Lumbar disc disease   . Migraines   . Nicotine dependence   . UTI (urinary tract infection)      Past Surgical History:  Procedure Laterality Date  . ABDOMINAL HYSTERECTOMY  1986   right tube and ovary removed--then laparoscopy after for ?retained cyst?  . CERVICAL DISCECTOMY  1993   C5-6  . CESAREAN SECTION  [id number]  . COLONOSCOPY    . IR  GENERIC HISTORICAL  01/18/2017   IR EMBO ART  VEN HEMORR LYMPH EXTRAV  INC GUIDE ROADMAPPING 01/18/2017 Greggory Keen, MD [facility]  . IR GENERIC HISTORICAL  01/18/2017   IR ANGIOGRAM SELECTIVE EACH ADDITIONAL VESSEL 01/18/2017 Greggory Keen, MD  [facility]  . IR GENERIC HISTORICAL  01/18/2017   IR ANGIOGRAM SELECTIVE EACH ADDITIONAL VESSEL 01/18/2017 Greggory Keen, MD [facility]  . IR GENERIC HISTORICAL  01/18/2017   IR US GUIDE VASC ACCESS RIGHT 01/18/2017 Greggory Keen, MD [facility]  . IR GENERIC HISTORICAL  01/18/2017   IR ANGIOGRAM VISCERAL SELECTIVE 01/18/2017 Greggory Keen, MD [facility]  . IR GENERIC HISTORICAL  01/18/2017   IR ANGIOGRAM SELECTIVE EACH ADDITIONAL VESSEL 01/18/2017 Greggory Keen, MD [facility]  . IR GENERIC HISTORICAL  01/18/2017   IR ANGIOGRAM VISCERAL SELECTIVE 01/18/2017 Greggory Keen, MD [facility]  . TONSILLECTOMY AND ADENOIDECTOMY  1970  . TUBAL LIGATION      Social History   Social History  . Marital status: Married    Spouse name: N/A  . Number of children: 2  . Years of education: N/A   Occupational History  . Social research officer, government ---retired   . 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 Smoking status: Current Every Day Smoker    Years: 0.00    Types: Cigarettes  . Smokeless tobacco: Never Used  . Alcohol use No  . Drug use: No  . Sexual activity: Not on file   Other Topics Concern  . Not on file   Social History Narrative   [name] daughter      Has living will   Husband is health care POA   Would accept resuscitation but no prolonged ventilation   No tube feeds if cognitively unaware     Family History  Problem Relation Age of Onset  . Cancer Mother   . COPD Father   . Heart disease Father   . Hypertension Father   . Thyroid disease Other   . Cancer Other     mother's side  . Heart disease Other     Dad's side  . Colon cancer Neg Hx     No current facility-administered medications for this visit.   Current Outpatient Prescriptions:  .  [START ON 01/23/2017] polyethylene glycol (MIRALAX / GLYCOLAX) packet, Take 17 g by mouth daily., Disp: 14 each, Rfl: 0  Facility-Administered Medications Ordered in Other Visits:  .  0.9 %  sodium  chloride infusion, , Intravenous, Once, Shanker Kristeen Mans, MD .  acetaminophen (TYLENOL) tablet 650 mg, 650 mg, Oral, Once, Shanker Kristeen Mans, MD .  albuterol (PROVENTIL) (2.5 MG/3ML) 0.083% nebulizer solution 2.5 mg, 2.5 mg, Nebulization, Q2H PRN, Jonetta Osgood, MD .  ciprofloxacin (CIPRO) tablet 500 mg, 500 mg, Oral, BID, Florencia Reasons, MD, 500 mg at 01/22/17 1101 .  diphenhydrAMINE (BENADRYL) injection 25 mg, 25 mg, Intravenous, Once, Shanker Kristeen Mans, MD .  fentaNYL ([place] - dosed mcg/hr) patch 25 mcg, 25 mcg, Transdermal, Q72H, Florencia Reasons, MD, 25 mcg at 01/20/17 2107 .  fentaNYL (SUBLIMAZE) injection 25 mcg, 25 mcg, Intravenous, Q2H PRN, Rise Patience, MD, 25 mcg at 01/19/17 2014 .  gabapentin (NEURONTIN) capsule 400 mg, 400 mg, Oral, TID, Honor Loh, RPH, 400 mg at 01/22/17 1538 .  HYDROcodone-acetaminophen (NORCO/VICODIN) 5-325 MG per tablet 1-2 tablet, 1-2 tablet, Oral, Q6H PRN, Saverio Danker, PA-C, 2 tablet at 01/22/17 1313 .  metroNIDAZOLE (FLAGYL) tablet  500 mg, 500 mg, Oral, Q8H, Florencia Reasons, MD, 500 mg at 01/22/17 1313 .  ondansetron (ZOFRAN) tablet 4 mg, 4 mg, Oral, Q6H PRN, 4 mg at 01/21/17 1844 **OR** ondansetron (ZOFRAN) injection 4 mg, 4 mg, Intravenous, Q6H PRN, Jonetta Osgood, MD, 4 mg at 01/19/17 0006 .  polyethylene glycol (MIRALAX / GLYCOLAX) packet 17 g, 17 g, Oral, Daily, Florencia Reasons, MD, 17 g at 01/22/17 1101 .  senna-docusate (Senokot-S) tablet 2 tablet, 2 tablet, Oral, QHS, Jonetta Osgood, MD, 2 tablet at 01/21/17 2047 .  sodium chloride flush (NS) 0.9 % injection 3 mL, 3 mL, Intravenous, Q12H, Saverio Danker, PA-C, 3 mL at 01/21/17 2200 .  sodium chloride flush (NS) 0.9 % injection 3 mL, 3 mL, Intravenous, Q12H, Jonetta Osgood, MD, 3 mL at 01/22/17 1230   Physical exam: There were no vitals filed for this visit. Physical Exam     CMP Latest Ref Rng & Units 01/22/2017  Glucose 65 - 99 mg/dL 87  BUN 6 - 20 mg/dL <5(L)  Creatinine 0.44 - 1.00 mg/dL  0.36(L)  Sodium 135 - 145 mmol/L 135  Potassium 3.5 - 5.1 mmol/L 4.0  Chloride 101 - 111 mmol/L 94(L)  CO2 22 - 32 mmol/L 30  Calcium 8.9 - 10.3 mg/dL 8.4(L)  Total Protein 6.5 - 8.1 g/dL 6.0(L)  Total Bilirubin 0.3 - 1.2 mg/dL 0.6  Alkaline Phos 38 - 126 U/L 134(H)  AST 15 - 41 U/L 31  ALT 14 - 54 U/L 16   CBC Latest Ref Rng & Units 01/22/2017  WBC 4.0 - 10.5 K/uL 24.5(H)  Hemoglobin 12.0 - 15.0 g/dL 8.5(L)  Hematocrit 36.0 - 46.0 % 28.3(L)  Platelets 150 - 400 K/uL 464(H)    No images are attached to the encounter.  Dg Chest 2 View  Result Date: 01/16/2017 CLINICAL DATA:  Liver metastases.  Tobacco use. EXAM: CHEST  2 VIEW COMPARISON:  August 25, 2015 FINDINGS: There are nipple shadows bilaterally. There is no evident edema or consolidation. No pulmonary nodular lesion is demonstrable by radiography. Heart size and pulmonary vascularity are normal. No adenopathy. There is atherosclerotic calcification in the aorta. No blastic or lytic bone lesions. IMPRESSION: Aortic atherosclerosis. No edema or consolidation. No evident mass or adenopathy. Electronically Signed   By: Lowella Grip III M.D.   On: 01/16/2017 14:34   Ct Angio Chest Pe W Or Wo Contrast  Result Date: 01/22/2017 CLINICAL DATA:  Hypoxia. EXAM: CT ANGIOGRAPHY CHEST WITH CONTRAST TECHNIQUE: Multidetector CT imaging of the chest was performed using the standard protocol during bolus administration of intravenous contrast. Multiplanar CT image reconstructions and MIPs were obtained to evaluate the vascular anatomy. CONTRAST:  80 mL Isovue 370 COMPARISON:  One-view chest x-ray 01/18/2017. CT of the abdomen and pelvis 01/20/2017 FINDINGS: Cardiovascular: The heart size is normal. The pulmonary arteries demonstrate excellent opacification. There is focal filling defect to suggest pulmonary embolus. Pulmonary arterial size is normal. The aorta demonstrates atherosclerotic change without aneurysm. A 4 vessel arch configuration is  present. The left vertebral artery Mediastinum/Nodes: No significant mediastinal or axillary adenopathy is present. The esophagus is unremarkable. Secretions versus small tracheal nodules are evident. Lungs/Pleura: Centrilobular emphysema is present. No focal nodule or mass lesion is present. Mild dependent atelectasis is present bilaterally. There are small bilateral pleural effusions. Upper Abdomen: Liver metastases are again noted. Musculoskeletal: No focal lytic or blastic lesions are present. Vertebral body heights alignment are maintained. Review of the MIP images confirms the above  findings. IMPRESSION: 1. No pulmonary embolus. 2. Centrilobular emphysema. 3. Secretions versus small tracheal nodules. Short-term follow-up CT scan of or bronchoscopy would be useful for further evaluation. 4. Aortic atherosclerosis. 5. Liver metastases. Electronically Signed   By: San Morelle M.D.   On: 01/22/2017 09:53   Mr Abdomen Wwo Contrast  Result Date: 01/15/2017 CLINICAL DATA:  Evaluate liver lesions noted on ultrasound. EXAM: MRI ABDOMEN WITHOUT AND WITH CONTRAST TECHNIQUE: Multiplanar multisequence MR imaging of the abdomen was performed both before and after the administration of intravenous contrast. CONTRAST:  37m MULTIHANCE GADOBENATE DIMEGLUMINE 529 MG/ML IV SOLN COMPARISON:  None. FINDINGS: Lower chest: No acute findings. Hepatobiliary: The liver is enlarged containing multiple ill defined heterogeneous and peripheral enhancing lesions worrisome for multifocal metastatic disease. The largest lesion is in the medial segment of left lobe measuring 6 cm, image number 26 of series 6. Lesion within segment 8 measures 3.2 cm, image number 8 of series 6. Segment 5 lesion measures 3.1 cm, image 31 of series 6. The gallbladder fundus extends into the large mass within the medial segment of left lobe, image number 65 of series 1401. No biliary dilatation. Pancreas:  No inflammation or mass. Spleen:  Within  normal limits in size and appearance. Adrenals/Urinary Tract: No masses identified. No evidence of hydronephrosis. Stomach/Bowel: The stomach is normal. The small bowel loops have a normal course and caliber. Visualized portions of the colon are unremarkable. Vascular/Lymphatic: Normal appearance of the abdominal aorta. Aortic atherosclerosis noted. Periaortic node is prominent measuring 8 mm, image number 50 of series 1401. Necrotic appearing portacaval node measures 1.5 cm, image 55 of series 1405. Porta hepatis node measures 1.3 cm, image 49 of series 1404 and has mass effect upon the extrahepatic portal vein. Other:  None. Musculoskeletal: No suspicious bone lesions identified. IMPRESSION: 1. Examination is positive for multifocal heterogeneous enhancing liver lesions worrisome for diffuse hepatic metastasis. Correlation with tissue sampling is advised. The dominant mass appears contiguous with the gallbladder fundus. Cannot rule out gallbladder carcinoma or other GI primary. 2. Enlarged portacaval and porta hepatic lymph nodes worrisome for metastatic adenopathy. Electronically Signed   By: TKerby MoorsM.D.   On: 01/15/2017 16:09   Ir Angiogram Visceral Selective  Result Date: 01/19/2017 INDICATION: Acute abdominal pain with acute perihepatic hemorrhage 2 hours status post percutaneous left hepatic metastasis biopsy. EXAM: SELECTIVE VISCERAL ARTERIOGRAPHY; IR EMBO ART VEN HEMORR LYMPH EXTRAV INC GUIDE ROADMAPPING; ADDITIONAL ARTERIOGRAPHY; IR ULTRASOUND GUIDANCE VASC ACCESS RIGHT MEDICATIONS: 1% lidocaine locally. ANESTHESIA/SEDATION: Moderate (conscious) sedation was employed during this procedure. A total of Versed 1.0 mg and Fentanyl 25 mcg was administered intravenously. Moderate Sedation Time: 50 minutes. The patient's level of consciousness and vital signs were monitored continuously by radiology nursing throughout the procedure under my direct supervision. CONTRAST:  1 ISOVUE-300 IOPAMIDOL  (ISOVUE-300) INJECTION 61%, 1 ISOVUE-300 IOPAMIDOL (ISOVUE-300) INJECTION 61% FLUOROSCOPY TIME:  Fluoroscopy Time: 14 minutes 12 seconds (144 mGy). COMPLICATIONS: None immediate. PROCEDURE: Informed consent was obtained from the patient following explanation of the procedure, risks, benefits and alternatives. The patient understands, agrees and consents for the procedure. All questions were addressed. A time out was performed prior to the initiation of the procedure. Maximal barrier sterile technique utilized including caps, mask, sterile gowns, sterile gloves, large sterile drape, hand hygiene, and Betadine prep. Under sterile conditions and local anesthesia, ultrasound micropuncture access performed of the right common femoral artery. Five French sheath inserted. SOS catheter was utilized to select the celiac origin. Celiac angiogram performed.  Celiac: Celiac origin is widely patent. Splenic, common hepatic, gastroduodenal artery, proper hepatic, right and left hepatic arteries are all patent. Irregularity of the hepatic vasculature is secondary to hepatomegaly and diffuse hepatic metastases. No active bleeding demonstrated. On the venous phase, the splenic vein and portal vein are all patent. Through the [place] a 5 French catheter, a Renegade STC microcatheter was advanced over a micro glidewire into the proper hepatic artery. Proper hepatic angiogram: Proper hepatic, right and left hepatic arteries are patent. Vasospasm present in the proximal right hepatic artery related to wire manipulation. Limited assessment of the vasculature because of the contrast injection and poor opacification of the left hepatic artery. Over a double angled Glidewire, the microcatheter was advanced into the left hepatic artery. Left hepatic angiogram performed. Left hepatic: Left hepatic artery is patent. This is the dominant supply to the left hepatic lobe lateral segment were the percutaneous biopsy was performed. Peripheral  irregularity of the arterial vasculature related to hepatic metastases. No active extravasation, acute bleeding, or other acute vascular abnormality including AV fistula or pseudoaneurysm. Because of the significant recent acute perihepatic hemorrhage and a biopsy performed in the left hepatic lobe lateral segment, Gel-Foam embolization of left hepatic artery will be performed. Left hepatic artery Gel-Foam embolization: Through the microcatheter, approximately 5 cc of Gel-Foam slurry was instilled into left hepatic artery. Following embolization, there is significant reduction in the left hepatic arterial peripheral vasculature and sluggish flow but the left hepatic main artery remains patent. Catheter was retracted and advanced into a left hepatic branch supplying the medial segment. Additional left hepatic angiogram performed. Additional left hepatic angiogram: Irregularity of the parenchymal staining related to diffuse hepatic metastases. Scattered peripheral occlusions present in the additional left hepatic branch related to the Gel-Foam embolization. Vasospasm noted at the catheter site. No active bleeding demonstrated. Microcatheter was removed. A final angiogram was performed through the SOS 5 French catheter of the celiac origin. Celiac: Following left hepatic Gel-Foam embolization, there is significant reduction in the arterial vascularity to the the left hepatic lobe where the biopsy was performed. Residual vasospasm present in the proximal left hepatic artery. No other significant acute vascular process. Catheter was retracted and advanced into the SMA. SMA angiogram performed. SMA angiogram: SMA is widely patent. No variant SMA supply demonstrated to the liver. On the venous phase, the superior mesenteric vein and portal vein all remain patent. Access removed. Hemostasis obtained with manual compression of the right common femoral artery puncture site. Patient tolerated the procedure well. No immediate  complication. IMPRESSION: Successful celiac, hepatic, and SMA angiograms as above. Successful micro catheterization, angiograms, and Gel-Foam embolization of the left hepatic artery resulting in significant reduction in arterial flow to the area the liver were the percutaneous biopsy was performed. Electronically Signed   By: Jerilynn Mages.  Shick M.D.   On: 01/19/2017 10:01   Ir Angiogram Visceral Selective  Result Date: 01/19/2017 INDICATION: Acute abdominal pain with acute perihepatic hemorrhage 2 hours status post percutaneous left hepatic metastasis biopsy. EXAM: SELECTIVE VISCERAL ARTERIOGRAPHY; IR EMBO ART VEN HEMORR LYMPH EXTRAV INC GUIDE ROADMAPPING; ADDITIONAL ARTERIOGRAPHY; IR ULTRASOUND GUIDANCE VASC ACCESS RIGHT MEDICATIONS: 1% lidocaine locally. ANESTHESIA/SEDATION: Moderate (conscious) sedation was employed during this procedure. A total of Versed 1.0 mg and Fentanyl 25 mcg was administered intravenously. Moderate Sedation Time: 50 minutes. The patient's level of consciousness and vital signs were monitored continuously by radiology nursing throughout the procedure under my direct supervision. CONTRAST:  1 ISOVUE-300 IOPAMIDOL (ISOVUE-300) INJECTION 61%, 1 ISOVUE-300 IOPAMIDOL (  ISOVUE-300) INJECTION 61% FLUOROSCOPY TIME:  Fluoroscopy Time: 14 minutes 12 seconds (144 mGy). COMPLICATIONS: None immediate. PROCEDURE: Informed consent was obtained from the patient following explanation of the procedure, risks, benefits and alternatives. The patient understands, agrees and consents for the procedure. All questions were addressed. A time out was performed prior to the initiation of the procedure. Maximal barrier sterile technique utilized including caps, mask, sterile gowns, sterile gloves, large sterile drape, hand hygiene, and Betadine prep. Under sterile conditions and local anesthesia, ultrasound micropuncture access performed of the right common femoral artery. Five French sheath inserted. SOS catheter was  utilized to select the celiac origin. Celiac angiogram performed. Celiac: Celiac origin is widely patent. Splenic, common hepatic, gastroduodenal artery, proper hepatic, right and left hepatic arteries are all patent. Irregularity of the hepatic vasculature is secondary to hepatomegaly and diffuse hepatic metastases. No active bleeding demonstrated. On the venous phase, the splenic vein and portal vein are all patent. Through the [place] a 5 French catheter, a Renegade STC microcatheter was advanced over a micro glidewire into the proper hepatic artery. Proper hepatic angiogram: Proper hepatic, right and left hepatic arteries are patent. Vasospasm present in the proximal right hepatic artery related to wire manipulation. Limited assessment of the vasculature because of the contrast injection and poor opacification of the left hepatic artery. Over a double angled Glidewire, the microcatheter was advanced into the left hepatic artery. Left hepatic angiogram performed. Left hepatic: Left hepatic artery is patent. This is the dominant supply to the left hepatic lobe lateral segment were the percutaneous biopsy was performed. Peripheral irregularity of the arterial vasculature related to hepatic metastases. No active extravasation, acute bleeding, or other acute vascular abnormality including AV fistula or pseudoaneurysm. Because of the significant recent acute perihepatic hemorrhage and a biopsy performed in the left hepatic lobe lateral segment, Gel-Foam embolization of left hepatic artery will be performed. Left hepatic artery Gel-Foam embolization: Through the microcatheter, approximately 5 cc of Gel-Foam slurry was instilled into left hepatic artery. Following embolization, there is significant reduction in the left hepatic arterial peripheral vasculature and sluggish flow but the left hepatic main artery remains patent. Catheter was retracted and advanced into a left hepatic branch supplying the medial segment.  Additional left hepatic angiogram performed. Additional left hepatic angiogram: Irregularity of the parenchymal staining related to diffuse hepatic metastases. Scattered peripheral occlusions present in the additional left hepatic branch related to the Gel-Foam embolization. Vasospasm noted at the catheter site. No active bleeding demonstrated. Microcatheter was removed. A final angiogram was performed through the SOS 5 French catheter of the celiac origin. Celiac: Following left hepatic Gel-Foam embolization, there is significant reduction in the arterial vascularity to the the left hepatic lobe where the biopsy was performed. Residual vasospasm present in the proximal left hepatic artery. No other significant acute vascular process. Catheter was retracted and advanced into the SMA. SMA angiogram performed. SMA angiogram: SMA is widely patent. No variant SMA supply demonstrated to the liver. On the venous phase, the superior mesenteric vein and portal vein all remain patent. Access removed. Hemostasis obtained with manual compression of the right common femoral artery puncture site. Patient tolerated the procedure well. No immediate complication. IMPRESSION: Successful celiac, hepatic, and SMA angiograms as above. Successful micro catheterization, angiograms, and Gel-Foam embolization of the left hepatic artery resulting in significant reduction in arterial flow to the area the liver were the percutaneous biopsy was performed. Electronically Signed   By: Jerilynn Mages.  Shick M.D.   On: 01/19/2017 10:01  Ir Angiogram Selective Each Additional Vessel  Result Date: 01/19/2017 INDICATION: Acute abdominal pain with acute perihepatic hemorrhage 2 hours status post percutaneous left hepatic metastasis biopsy. EXAM: SELECTIVE VISCERAL ARTERIOGRAPHY; IR EMBO ART VEN HEMORR LYMPH EXTRAV INC GUIDE ROADMAPPING; ADDITIONAL ARTERIOGRAPHY; IR ULTRASOUND GUIDANCE VASC ACCESS RIGHT MEDICATIONS: 1% lidocaine locally. ANESTHESIA/SEDATION:  Moderate (conscious) sedation was employed during this procedure. A total of Versed 1.0 mg and Fentanyl 25 mcg was administered intravenously. Moderate Sedation Time: 50 minutes. The patient's level of consciousness and vital signs were monitored continuously by radiology nursing throughout the procedure under my direct supervision. CONTRAST:  1 ISOVUE-300 IOPAMIDOL (ISOVUE-300) INJECTION 61%, 1 ISOVUE-300 IOPAMIDOL (ISOVUE-300) INJECTION 61% FLUOROSCOPY TIME:  Fluoroscopy Time: 14 minutes 12 seconds (144 mGy). COMPLICATIONS: None immediate. PROCEDURE: Informed consent was obtained from the patient following explanation of the procedure, risks, benefits and alternatives. The patient understands, agrees and consents for the procedure. All questions were addressed. A time out was performed prior to the initiation of the procedure. Maximal barrier sterile technique utilized including caps, mask, sterile gowns, sterile gloves, large sterile drape, hand hygiene, and Betadine prep. Under sterile conditions and local anesthesia, ultrasound micropuncture access performed of the right common femoral artery. Five French sheath inserted. SOS catheter was utilized to select the celiac origin. Celiac angiogram performed. Celiac: Celiac origin is widely patent. Splenic, common hepatic, gastroduodenal artery, proper hepatic, right and left hepatic arteries are all patent. Irregularity of the hepatic vasculature is secondary to hepatomegaly and diffuse hepatic metastases. No active bleeding demonstrated. On the venous phase, the splenic vein and portal vein are all patent. Through the [place] a 5 French catheter, a Renegade STC microcatheter was advanced over a micro glidewire into the proper hepatic artery. Proper hepatic angiogram: Proper hepatic, right and left hepatic arteries are patent. Vasospasm present in the proximal right hepatic artery related to wire manipulation. Limited assessment of the vasculature because of the  contrast injection and poor opacification of the left hepatic artery. Over a double angled Glidewire, the microcatheter was advanced into the left hepatic artery. Left hepatic angiogram performed. Left hepatic: Left hepatic artery is patent. This is the dominant supply to the left hepatic lobe lateral segment were the percutaneous biopsy was performed. Peripheral irregularity of the arterial vasculature related to hepatic metastases. No active extravasation, acute bleeding, or other acute vascular abnormality including AV fistula or pseudoaneurysm. Because of the significant recent acute perihepatic hemorrhage and a biopsy performed in the left hepatic lobe lateral segment, Gel-Foam embolization of left hepatic artery will be performed. Left hepatic artery Gel-Foam embolization: Through the microcatheter, approximately 5 cc of Gel-Foam slurry was instilled into left hepatic artery. Following embolization, there is significant reduction in the left hepatic arterial peripheral vasculature and sluggish flow but the left hepatic main artery remains patent. Catheter was retracted and advanced into a left hepatic branch supplying the medial segment. Additional left hepatic angiogram performed. Additional left hepatic angiogram: Irregularity of the parenchymal staining related to diffuse hepatic metastases. Scattered peripheral occlusions present in the additional left hepatic branch related to the Gel-Foam embolization. Vasospasm noted at the catheter site. No active bleeding demonstrated. Microcatheter was removed. A final angiogram was performed through the SOS 5 French catheter of the celiac origin. Celiac: Following left hepatic Gel-Foam embolization, there is significant reduction in the arterial vascularity to the the left hepatic lobe where the biopsy was performed. Residual vasospasm present in the proximal left hepatic artery. No other significant acute vascular process. Catheter was retracted and  advanced into  the SMA. SMA angiogram performed. SMA angiogram: SMA is widely patent. No variant SMA supply demonstrated to the liver. On the venous phase, the superior mesenteric vein and portal vein all remain patent. Access removed. Hemostasis obtained with manual compression of the right common femoral artery puncture site. Patient tolerated the procedure well. No immediate complication. IMPRESSION: Successful celiac, hepatic, and SMA angiograms as above. Successful micro catheterization, angiograms, and Gel-Foam embolization of the left hepatic artery resulting in significant reduction in arterial flow to the area the liver were the percutaneous biopsy was performed. Electronically Signed   By: Jerilynn Mages.  Shick M.D.   On: 01/19/2017 10:01   Ir Angiogram Selective Each Additional Vessel  Result Date: 01/19/2017 INDICATION: Acute abdominal pain with acute perihepatic hemorrhage 2 hours status post percutaneous left hepatic metastasis biopsy. EXAM: SELECTIVE VISCERAL ARTERIOGRAPHY; IR EMBO ART VEN HEMORR LYMPH EXTRAV INC GUIDE ROADMAPPING; ADDITIONAL ARTERIOGRAPHY; IR ULTRASOUND GUIDANCE VASC ACCESS RIGHT MEDICATIONS: 1% lidocaine locally. ANESTHESIA/SEDATION: Moderate (conscious) sedation was employed during this procedure. A total of Versed 1.0 mg and Fentanyl 25 mcg was administered intravenously. Moderate Sedation Time: 50 minutes. The patient's level of consciousness and vital signs were monitored continuously by radiology nursing throughout the procedure under my direct supervision. CONTRAST:  1 ISOVUE-300 IOPAMIDOL (ISOVUE-300) INJECTION 61%, 1 ISOVUE-300 IOPAMIDOL (ISOVUE-300) INJECTION 61% FLUOROSCOPY TIME:  Fluoroscopy Time: 14 minutes 12 seconds (144 mGy). COMPLICATIONS: None immediate. PROCEDURE: Informed consent was obtained from the patient following explanation of the procedure, risks, benefits and alternatives. The patient understands, agrees and consents for the procedure. All questions were addressed. A time out  was performed prior to the initiation of the procedure. Maximal barrier sterile technique utilized including caps, mask, sterile gowns, sterile gloves, large sterile drape, hand hygiene, and Betadine prep. Under sterile conditions and local anesthesia, ultrasound micropuncture access performed of the right common femoral artery. Five French sheath inserted. SOS catheter was utilized to select the celiac origin. Celiac angiogram performed. Celiac: Celiac origin is widely patent. Splenic, common hepatic, gastroduodenal artery, proper hepatic, right and left hepatic arteries are all patent. Irregularity of the hepatic vasculature is secondary to hepatomegaly and diffuse hepatic metastases. No active bleeding demonstrated. On the venous phase, the splenic vein and portal vein are all patent. Through the [place] a 5 French catheter, a Renegade STC microcatheter was advanced over a micro glidewire into the proper hepatic artery. Proper hepatic angiogram: Proper hepatic, right and left hepatic arteries are patent. Vasospasm present in the proximal right hepatic artery related to wire manipulation. Limited assessment of the vasculature because of the contrast injection and poor opacification of the left hepatic artery. Over a double angled Glidewire, the microcatheter was advanced into the left hepatic artery. Left hepatic angiogram performed. Left hepatic: Left hepatic artery is patent. This is the dominant supply to the left hepatic lobe lateral segment were the percutaneous biopsy was performed. Peripheral irregularity of the arterial vasculature related to hepatic metastases. No active extravasation, acute bleeding, or other acute vascular abnormality including AV fistula or pseudoaneurysm. Because of the significant recent acute perihepatic hemorrhage and a biopsy performed in the left hepatic lobe lateral segment, Gel-Foam embolization of left hepatic artery will be performed. Left hepatic artery Gel-Foam embolization:  Through the microcatheter, approximately 5 cc of Gel-Foam slurry was instilled into left hepatic artery. Following embolization, there is significant reduction in the left hepatic arterial peripheral vasculature and sluggish flow but the left hepatic main artery remains patent. Catheter was retracted and advanced into  a left hepatic branch supplying the medial segment. Additional left hepatic angiogram performed. Additional left hepatic angiogram: Irregularity of the parenchymal staining related to diffuse hepatic metastases. Scattered peripheral occlusions present in the additional left hepatic branch related to the Gel-Foam embolization. Vasospasm noted at the catheter site. No active bleeding demonstrated. Microcatheter was removed. A final angiogram was performed through the SOS 5 French catheter of the celiac origin. Celiac: Following left hepatic Gel-Foam embolization, there is significant reduction in the arterial vascularity to the the left hepatic lobe where the biopsy was performed. Residual vasospasm present in the proximal left hepatic artery. No other significant acute vascular process. Catheter was retracted and advanced into the SMA. SMA angiogram performed. SMA angiogram: SMA is widely patent. No variant SMA supply demonstrated to the liver. On the venous phase, the superior mesenteric vein and portal vein all remain patent. Access removed. Hemostasis obtained with manual compression of the right common femoral artery puncture site. Patient tolerated the procedure well. No immediate complication. IMPRESSION: Successful celiac, hepatic, and SMA angiograms as above. Successful micro catheterization, angiograms, and Gel-Foam embolization of the left hepatic artery resulting in significant reduction in arterial flow to the area the liver were the percutaneous biopsy was performed. Electronically Signed   By: Jerilynn Mages.  Shick M.D.   On: 01/19/2017 10:01   Ir Angiogram Selective Each Additional Vessel  Result  Date: 01/19/2017 INDICATION: Acute abdominal pain with acute perihepatic hemorrhage 2 hours status post percutaneous left hepatic metastasis biopsy. EXAM: SELECTIVE VISCERAL ARTERIOGRAPHY; IR EMBO ART VEN HEMORR LYMPH EXTRAV INC GUIDE ROADMAPPING; ADDITIONAL ARTERIOGRAPHY; IR ULTRASOUND GUIDANCE VASC ACCESS RIGHT MEDICATIONS: 1% lidocaine locally. ANESTHESIA/SEDATION: Moderate (conscious) sedation was employed during this procedure. A total of Versed 1.0 mg and Fentanyl 25 mcg was administered intravenously. Moderate Sedation Time: 50 minutes. The patient's level of consciousness and vital signs were monitored continuously by radiology nursing throughout the procedure under my direct supervision. CONTRAST:  1 ISOVUE-300 IOPAMIDOL (ISOVUE-300) INJECTION 61%, 1 ISOVUE-300 IOPAMIDOL (ISOVUE-300) INJECTION 61% FLUOROSCOPY TIME:  Fluoroscopy Time: 14 minutes 12 seconds (144 mGy). COMPLICATIONS: None immediate. PROCEDURE: Informed consent was obtained from the patient following explanation of the procedure, risks, benefits and alternatives. The patient understands, agrees and consents for the procedure. All questions were addressed. A time out was performed prior to the initiation of the procedure. Maximal barrier sterile technique utilized including caps, mask, sterile gowns, sterile gloves, large sterile drape, hand hygiene, and Betadine prep. Under sterile conditions and local anesthesia, ultrasound micropuncture access performed of the right common femoral artery. Five French sheath inserted. SOS catheter was utilized to select the celiac origin. Celiac angiogram performed. Celiac: Celiac origin is widely patent. Splenic, common hepatic, gastroduodenal artery, proper hepatic, right and left hepatic arteries are all patent. Irregularity of the hepatic vasculature is secondary to hepatomegaly and diffuse hepatic metastases. No active bleeding demonstrated. On the venous phase, the splenic vein and portal vein are all  patent. Through the [place] a 5 French catheter, a Renegade STC microcatheter was advanced over a micro glidewire into the proper hepatic artery. Proper hepatic angiogram: Proper hepatic, right and left hepatic arteries are patent. Vasospasm present in the proximal right hepatic artery related to wire manipulation. Limited assessment of the vasculature because of the contrast injection and poor opacification of the left hepatic artery. Over a double angled Glidewire, the microcatheter was advanced into the left hepatic artery. Left hepatic angiogram performed. Left hepatic: Left hepatic artery is patent. This is the dominant supply to the left  hepatic lobe lateral segment were the percutaneous biopsy was performed. Peripheral irregularity of the arterial vasculature related to hepatic metastases. No active extravasation, acute bleeding, or other acute vascular abnormality including AV fistula or pseudoaneurysm. Because of the significant recent acute perihepatic hemorrhage and a biopsy performed in the left hepatic lobe lateral segment, Gel-Foam embolization of left hepatic artery will be performed. Left hepatic artery Gel-Foam embolization: Through the microcatheter, approximately 5 cc of Gel-Foam slurry was instilled into left hepatic artery. Following embolization, there is significant reduction in the left hepatic arterial peripheral vasculature and sluggish flow but the left hepatic main artery remains patent. Catheter was retracted and advanced into a left hepatic branch supplying the medial segment. Additional left hepatic angiogram performed. Additional left hepatic angiogram: Irregularity of the parenchymal staining related to diffuse hepatic metastases. Scattered peripheral occlusions present in the additional left hepatic branch related to the Gel-Foam embolization. Vasospasm noted at the catheter site. No active bleeding demonstrated. Microcatheter was removed. A final angiogram was performed through the SOS  5 French catheter of the celiac origin. Celiac: Following left hepatic Gel-Foam embolization, there is significant reduction in the arterial vascularity to the the left hepatic lobe where the biopsy was performed. Residual vasospasm present in the proximal left hepatic artery. No other significant acute vascular process. Catheter was retracted and advanced into the SMA. SMA angiogram performed. SMA angiogram: SMA is widely patent. No variant SMA supply demonstrated to the liver. On the venous phase, the superior mesenteric vein and portal vein all remain patent. Access removed. Hemostasis obtained with manual compression of the right common femoral artery puncture site. Patient tolerated the procedure well. No immediate complication. IMPRESSION: Successful celiac, hepatic, and SMA angiograms as above. Successful micro catheterization, angiograms, and Gel-Foam embolization of the left hepatic artery resulting in significant reduction in arterial flow to the area the liver were the percutaneous biopsy was performed. Electronically Signed   By: Jerilynn Mages.  Shick M.D.   On: 01/19/2017 10:01   US Biopsy  Result Date: 01/18/2017 INDICATION: LIVER METASTASES, UNKNOWN PRIMARY. EXAM: ULTRASOUND BIOPSY CORE LIVER MEDICATIONS: 1% LIDOCAINE ANESTHESIA/SEDATION: Moderate (conscious) sedation was employed during this procedure. A total of Versed 1.0 mg and Fentanyl 50 mcg was administered intravenously. Moderate Sedation Time: 10 minutes. The patient's level of consciousness and vital signs were monitored continuously by radiology nursing throughout the procedure under my direct supervision. FLUOROSCOPY TIME:  Fluoroscopy Time: NONE. COMPLICATIONS: None immediate. PROCEDURE: Informed written consent was obtained from the patient after a thorough discussion of the procedural risks, benefits and alternatives. All questions were addressed. Maximal Sterile Barrier Technique was utilized including caps, mask, sterile gowns, sterile  gloves, sterile drape, hand hygiene and skin antiseptic. A timeout was performed prior to the initiation of the procedure. Previous imaging reviewed. Preliminary ultrasound performed. A solid left hepatic lobe lesion was localized from a subxiphoid window. This was correlated with the abdominal MRI. Overlying skin was marked. Under sterile conditions and local anesthesia, a 17 gauge 6.8 cm access needle was advanced from an anterior subxiphoid approach into the left hepatic mass. Needle position confirmed with ultrasound. Three 1 cm 18 gauge core biopsies obtained. Samples placed in formalin. These were intact and non fragmented. Needle tract embolized with Gel-Foam. Postprocedure imaging demonstrates no hemorrhage or hematoma. Patient tolerated the biopsy well. IMPRESSION: Successful ultrasound left hepatic mass 18 gauge core biopsy Electronically Signed   By: Jerilynn Mages.  Shick M.D.   On: 01/18/2017 14:53   Ir US Guide Vasc Access Right  Result Date: 01/19/2017 INDICATION: Acute abdominal pain with acute perihepatic hemorrhage 2 hours status post percutaneous left hepatic metastasis biopsy. EXAM: SELECTIVE VISCERAL ARTERIOGRAPHY; IR EMBO ART VEN HEMORR LYMPH EXTRAV INC GUIDE ROADMAPPING; ADDITIONAL ARTERIOGRAPHY; IR ULTRASOUND GUIDANCE VASC ACCESS RIGHT MEDICATIONS: 1% lidocaine locally. ANESTHESIA/SEDATION: Moderate (conscious) sedation was employed during this procedure. A total of Versed 1.0 mg and Fentanyl 25 mcg was administered intravenously. Moderate Sedation Time: 50 minutes. The patient's level of consciousness and vital signs were monitored continuously by radiology nursing throughout the procedure under my direct supervision. CONTRAST:  1 ISOVUE-300 IOPAMIDOL (ISOVUE-300) INJECTION 61%, 1 ISOVUE-300 IOPAMIDOL (ISOVUE-300) INJECTION 61% FLUOROSCOPY TIME:  Fluoroscopy Time: 14 minutes 12 seconds (144 mGy). COMPLICATIONS: None immediate. PROCEDURE: Informed consent was obtained from the patient following  explanation of the procedure, risks, benefits and alternatives. The patient understands, agrees and consents for the procedure. All questions were addressed. A time out was performed prior to the initiation of the procedure. Maximal barrier sterile technique utilized including caps, mask, sterile gowns, sterile gloves, large sterile drape, hand hygiene, and Betadine prep. Under sterile conditions and local anesthesia, ultrasound micropuncture access performed of the right common femoral artery. Five French sheath inserted. SOS catheter was utilized to select the celiac origin. Celiac angiogram performed. Celiac: Celiac origin is widely patent. Splenic, common hepatic, gastroduodenal artery, proper hepatic, right and left hepatic arteries are all patent. Irregularity of the hepatic vasculature is secondary to hepatomegaly and diffuse hepatic metastases. No active bleeding demonstrated. On the venous phase, the splenic vein and portal vein are all patent. Through the [place] a 5 French catheter, a Renegade STC microcatheter was advanced over a micro glidewire into the proper hepatic artery. Proper hepatic angiogram: Proper hepatic, right and left hepatic arteries are patent. Vasospasm present in the proximal right hepatic artery related to wire manipulation. Limited assessment of the vasculature because of the contrast injection and poor opacification of the left hepatic artery. Over a double angled Glidewire, the microcatheter was advanced into the left hepatic artery. Left hepatic angiogram performed. Left hepatic: Left hepatic artery is patent. This is the dominant supply to the left hepatic lobe lateral segment were the percutaneous biopsy was performed. Peripheral irregularity of the arterial vasculature related to hepatic metastases. No active extravasation, acute bleeding, or other acute vascular abnormality including AV fistula or pseudoaneurysm. Because of the significant recent acute perihepatic hemorrhage and a  biopsy performed in the left hepatic lobe lateral segment, Gel-Foam embolization of left hepatic artery will be performed. Left hepatic artery Gel-Foam embolization: Through the microcatheter, approximately 5 cc of Gel-Foam slurry was instilled into left hepatic artery. Following embolization, there is significant reduction in the left hepatic arterial peripheral vasculature and sluggish flow but the left hepatic main artery remains patent. Catheter was retracted and advanced into a left hepatic branch supplying the medial segment. Additional left hepatic angiogram performed. Additional left hepatic angiogram: Irregularity of the parenchymal staining related to diffuse hepatic metastases. Scattered peripheral occlusions present in the additional left hepatic branch related to the Gel-Foam embolization. Vasospasm noted at the catheter site. No active bleeding demonstrated. Microcatheter was removed. A final angiogram was performed through the SOS 5 French catheter of the celiac origin. Celiac: Following left hepatic Gel-Foam embolization, there is significant reduction in the arterial vascularity to the the left hepatic lobe where the biopsy was performed. Residual vasospasm present in the proximal left hepatic artery. No other significant acute vascular process. Catheter was retracted and advanced into the SMA. SMA angiogram performed.  SMA angiogram: SMA is widely patent. No variant SMA supply demonstrated to the liver. On the venous phase, the superior mesenteric vein and portal vein all remain patent. Access removed. Hemostasis obtained with manual compression of the right common femoral artery puncture site. Patient tolerated the procedure well. No immediate complication. IMPRESSION: Successful celiac, hepatic, and SMA angiograms as above. Successful micro catheterization, angiograms, and Gel-Foam embolization of the left hepatic artery resulting in significant reduction in arterial flow to the area the liver  were the percutaneous biopsy was performed. Electronically Signed   By: Jerilynn Mages.  Shick M.D.   On: 01/19/2017 10:01   Dg Chest Port 1v Same Day  Result Date: 01/18/2017 CLINICAL DATA:  Acute onset of shortness of breath, vomiting and epigastric abdominal pain. Fever. Initial encounter. EXAM: PORTABLE CHEST 1 VIEW COMPARISON:  Chest radiograph performed 01/16/2017 FINDINGS: The lungs are hyperexpanded, with flattening of the hemidiaphragms, compatible with COPD. There is no evidence of focal opacification, pleural effusion or pneumothorax. The cardiomediastinal silhouette is within normal limits. No acute osseous abnormalities are seen. IMPRESSION: Findings of COPD.  Lungs remain otherwise clear. Electronically Signed   By: Garald Balding M.D.   On: 01/18/2017 23:48   Dg Abd Portable 1v  Result Date: 01/18/2017 CLINICAL DATA:  Acute onset of shortness of breath, vomiting and epigastric abdominal pain. Recent liver biopsy. Fever. Initial encounter. EXAM: PORTABLE ABDOMEN - 1 VIEW COMPARISON:  None. FINDINGS: The visualized bowel gas pattern is unremarkable. Scattered air and stool filled loops of colon are seen; no abnormal dilatation of small bowel loops is seen to suggest small bowel obstruction. No free intra-abdominal air is identified, though evaluation for free air is limited on a single supine view. The stomach is largely filled with air. The visualized osseous structures are within normal limits; the sacroiliac joints are unremarkable in appearance. IMPRESSION: Unremarkable bowel gas pattern; no free intra-abdominal air seen. Moderate amount of stool noted in the colon. Electronically Signed   By: Garald Balding M.D.   On: 01/18/2017 23:50   [place] Guide Roadmapping  Result Date: 01/19/2017 INDICATION: Acute abdominal pain with acute perihepatic hemorrhage 2 hours status post percutaneous left hepatic metastasis biopsy. EXAM: SELECTIVE VISCERAL ARTERIOGRAPHY; IR EMBO  ART VEN HEMORR LYMPH EXTRAV INC GUIDE ROADMAPPING; ADDITIONAL ARTERIOGRAPHY; IR ULTRASOUND GUIDANCE VASC ACCESS RIGHT MEDICATIONS: 1% lidocaine locally. ANESTHESIA/SEDATION: Moderate (conscious) sedation was employed during this procedure. A total of Versed 1.0 mg and Fentanyl 25 mcg was administered intravenously. Moderate Sedation Time: 50 minutes. The patient's level of consciousness and vital signs were monitored continuously by radiology nursing throughout the procedure under my direct supervision. CONTRAST:  1 ISOVUE-300 IOPAMIDOL (ISOVUE-300) INJECTION 61%, 1 ISOVUE-300 IOPAMIDOL (ISOVUE-300) INJECTION 61% FLUOROSCOPY TIME:  Fluoroscopy Time: 14 minutes 12 seconds (144 mGy). COMPLICATIONS: None immediate. PROCEDURE: Informed consent was obtained from the patient following explanation of the procedure, risks, benefits and alternatives. The patient understands, agrees and consents for the procedure. All questions were addressed. A time out was performed prior to the initiation of the procedure. Maximal barrier sterile technique utilized including caps, mask, sterile gowns, sterile gloves, large sterile drape, hand hygiene, and Betadine prep. Under sterile conditions and local anesthesia, ultrasound micropuncture access performed of the right common femoral artery. Five French sheath inserted. SOS catheter was utilized to select the celiac origin. Celiac angiogram performed. Celiac: Celiac origin is widely patent. Splenic, common hepatic, gastroduodenal artery, proper hepatic, right and left hepatic arteries are all  patent. Irregularity of the hepatic vasculature is secondary to hepatomegaly and diffuse hepatic metastases. No active bleeding demonstrated. On the venous phase, the splenic vein and portal vein are all patent. Through the [place] a 5 French catheter, a Renegade STC microcatheter was advanced over a micro glidewire into the proper hepatic artery. Proper hepatic angiogram: Proper hepatic, right and left  hepatic arteries are patent. Vasospasm present in the proximal right hepatic artery related to wire manipulation. Limited assessment of the vasculature because of the contrast injection and poor opacification of the left hepatic artery. Over a double angled Glidewire, the microcatheter was advanced into the left hepatic artery. Left hepatic angiogram performed. Left hepatic: Left hepatic artery is patent. This is the dominant supply to the left hepatic lobe lateral segment were the percutaneous biopsy was performed. Peripheral irregularity of the arterial vasculature related to hepatic metastases. No active extravasation, acute bleeding, or other acute vascular abnormality including AV fistula or pseudoaneurysm. Because of the significant recent acute perihepatic hemorrhage and a biopsy performed in the left hepatic lobe lateral segment, Gel-Foam embolization of left hepatic artery will be performed. Left hepatic artery Gel-Foam embolization: Through the microcatheter, approximately 5 cc of Gel-Foam slurry was instilled into left hepatic artery. Following embolization, there is significant reduction in the left hepatic arterial peripheral vasculature and sluggish flow but the left hepatic main artery remains patent. Catheter was retracted and advanced into a left hepatic branch supplying the medial segment. Additional left hepatic angiogram performed. Additional left hepatic angiogram: Irregularity of the parenchymal staining related to diffuse hepatic metastases. Scattered peripheral occlusions present in the additional left hepatic branch related to the Gel-Foam embolization. Vasospasm noted at the catheter site. No active bleeding demonstrated. Microcatheter was removed. A final angiogram was performed through the SOS 5 French catheter of the celiac origin. Celiac: Following left hepatic Gel-Foam embolization, there is significant reduction in the arterial vascularity to the the left hepatic lobe where the biopsy  was performed. Residual vasospasm present in the proximal left hepatic artery. No other significant acute vascular process. Catheter was retracted and advanced into the SMA. SMA angiogram performed. SMA angiogram: SMA is widely patent. No variant SMA supply demonstrated to the liver. On the venous phase, the superior mesenteric vein and portal vein all remain patent. Access removed. Hemostasis obtained with manual compression of the right common femoral artery puncture site. Patient tolerated the procedure well. No immediate complication. IMPRESSION: Successful celiac, hepatic, and SMA angiograms as above. Successful micro catheterization, angiograms, and Gel-Foam embolization of the left hepatic artery resulting in significant reduction in arterial flow to the area the liver were the percutaneous biopsy was performed. Electronically Signed   By: Jerilynn Mages.  Shick M.D.   On: 01/19/2017 10:01   Ct Angio Abd/pel W/ And/or W/o  Result Date: 01/20/2017 CLINICAL DATA:  Hepatic metastases, status post ultrasound percutaneous biopsy which was complicated by acute bleeding 2 hours after the biopsy. Patient subsequently underwent Gel-Foam embolization of left hepatic artery. Despite transfusions, further decrease in hemoglobin this morning. EXAM: CT ANGIOGRAPHY ABDOMEN AND PELVIS TECHNIQUE: Multidetector CT imaging of the abdomen and pelvis was performed using the standard protocol during bolus administration of intravenous contrast. Multiplanar reconstructed images including MIPs were obtained and reviewed to evaluate the vascular anatomy. CONTRAST:  100 cc Isovue COMPARISON:  01/18/2017 FINDINGS: Arterial findings: Aorta: Scattered moderate atherosclerosis of the aorta, most pronounced in the infrarenal aorta without occlusion, aneurysm or dissection. No retroperitoneal hemorrhage. Celiac axis: Atherosclerotic origin but remains patent. Splenic, left  gastric, and hepatic branches appear patent. No active bleeding or acute  vascular process. Superior mesenteric: Atherosclerotic origin but remains patent. SMA branches appear patent. No active bleeding. Left renal:           Mild atherosclerosis but widely patent. Right renal:          Mild atherosclerosis but widely patent. Inferior mesenteric:  Atherosclerotic origin but remains patent. Left iliac: Iliac atherosclerosis noted without occlusion. Left common, internal and external iliac arteries are patent. Right iliac: Similar atherosclerosis without occlusion. Right common, internal and external iliac arteries are patent. Venous findings: Hepatic veins, portal, splenic, mesenteric veins are patent. No veno-occlusive process. IVC, renal veins common iliac veins are patent. Review of the MIP images confirms the above findings. Nonvascular findings: Basilar emphysema noted. Minor atelectasis. Normal heart size. Trace pleural effusions. No pericardial effusion. Hypodense peripherally enhancing hepatic masses again evident compatible with metastases. No evidence of active bleeding or acute vascular process within the liver. No biliary dilatation. Gallbladder is collapsed but contains partially calcified gallstones. Moderate Heterogeneous perihepatic free fluid, compatible with a mixture of ascites and known hemoperitoneum related to the recent left hepatic mass biopsy. Spleen is unremarkable. Small amount of hemoperitoneum about the spleen upper pole. Adrenal glands, kidneys, ureters, and bladder demonstrate no acute process, obstruction or hydronephrosis. Negative for bowel obstruction, significant dilatation, ileus, or free air. No adenopathy Remote hysterectomy.  Pelvic ascites also evident. No inguinal abnormality or hernia. Intact abdominal wall. No rectus sheath hemorrhage or hematoma over the left hepatic biopsy site. Degenerative changes of the spine. Bones are mildly osteopenic. No acute osseous finding. IMPRESSION: Negative for acute or active intra-abdominal bleeding. Stable  diffuse hepatic metastatic process. Stable perihepatic ascites and hemoperitoneum related to the recent left hepatic mass biopsy. Intact abdominal wall without rectus sheath hematoma or hemorrhage. Aortoiliac atherosclerosis Cholelithiasis Pelvic ascites as well These results were called by telephone at the time of interpretation on 01/20/2017 at 2:13 pm to Dr. Florencia Reasons , who verbally acknowledged these results. Electronically Signed   By: Jerilynn Mages.  Shick M.D.   On: 01/20/2017 14:15   US Abdomen Limited Ruq  Result Date: 01/18/2017 CLINICAL DATA:  61 year old female with a history of multiple liver masses, status post percutaneous biopsy today's date. Interval development of abdominal pain EXAM: US ABDOMEN LIMITED - RIGHT UPPER QUADRANT COMPARISON:  Ultrasound 01/18/2017, MRI 01/15/2017 FINDINGS: Gallbladder: Contracted gallbladder with echogenic focus in the lumen compatible with cholelithiasis. Common bile duct: Diameter: 4 mm -5 mm Liver: Re- demonstration of mass within right liver lobe 7 point far cm by 5.4 cm x 7.4 cm. The biopsy of the left liver lobe on the comparison ultrasound today's date demonstrates small echogenic focus, compatible with hemostatic material. There is hyperechoic free fluid adjacent to the liver margin, both anterior and posterior to the left liver lobe. IMPRESSION: New hyperechoic free fluid adjacent to the left liver lobe, status post percutaneous biopsy, compatible with hemorrhage. Re- demonstration of liver masses, better characterized on prior MRI. These results were called by telephone at the time of interpretation on 01/18/2017 at 5:42 pm to Dr. Annamaria Boots who verbally acknowledged these results. Electronically Signed   By: Corrie Mckusick D.O.   On: 01/18/2017 17:43   US Abdomen Limited Ruq  Result Date: 01/11/2017 CLINICAL DATA:  Right upper quadrant pain . EXAM: US ABDOMEN LIMITED - RIGHT UPPER QUADRANT COMPARISON:  No recent prior . FINDINGS: Gallbladder: Multiple gallstones are  noted. Gallstone stones measure up to 1.1 cm.  Gallbladder wall is thickened at 1.1 cm. Cholecystitis cannot be excluded. Negative Murphy sign. Common bile duct: Diameter: 2.0 mm Liver: Multiple solid hepatic lesions are noted. Largest lesion measures 9.7 cm and is in the right hepatic lobe. These findings are highly suspicious for metastatic disease. A pancreatic head mass cannot be excluded. Appear pancreatic adenopathy cannot be excluded. Portal vein is narrowed most likely from periportal adenopathy. Portal vein however is patent. IMPRESSION: 1. Multiple gallstones. Gallbladder wall is thickened at 1.1 cm. Cholecystitis cannot be excluded. 2. Multiple solid hepatic lesions. The largest measures 9.7 cm. Pancreatic mass appears to be present. Pancreatic solid lesions noted most likely adenopathy. Portal vein is narrowed possibly from periportal adenopathy. Portal vein however is patent. These findings together suggest malignancy with metastatic disease. Further evaluation with gadolinium-enhanced MRI of the abdomen should be considered. Electronically Signed   By: Marcello Moores  Register   On: 01/11/2017 12:19    Assessment and plan- Patient is a 61 y.o. female ***   Thank you for this kind referral and the opportunity to participate in the care of this  Patient   Visit Diagnosis No diagnosis found.  Dr. Randa Evens, MD, MPH [facility] Pager- [phone] 01/22/2017

## 2017-01-22 NOTE — Care Management Important Message (Signed)
Important Message  Patient Details  Name: Rachel Butler MRN: [id number] Date of Birth: 08/12/1956   Medicare Important Message Given:  Yes    Nathen May 01/22/2017, 10:46 AM

## 2017-01-22 NOTE — Progress Notes (Signed)
  Echocardiogram 2D Echocardiogram has been performed.  Rachel Butler 01/22/2017, 1:26 PM

## 2017-01-23 DIAGNOSIS — F17200 Nicotine dependence, unspecified, uncomplicated: Secondary | ICD-10-CM

## 2017-01-23 DIAGNOSIS — I340 Nonrheumatic mitral (valve) insufficiency: Secondary | ICD-10-CM

## 2017-01-23 DIAGNOSIS — I38 Endocarditis, valve unspecified: Secondary | ICD-10-CM

## 2017-01-23 DIAGNOSIS — A419 Sepsis, unspecified organism: Secondary | ICD-10-CM

## 2017-01-23 LAB — CULTURE, BLOOD (ROUTINE X 2)
CULTURE: NO GROWTH
Culture: NO GROWTH

## 2017-01-23 LAB — CBC
HEMATOCRIT: 29.6 % — AB (ref 36.0–46.0)
HEMOGLOBIN: 8.9 g/dL — AB (ref 12.0–15.0)
MCH: 26.1 pg (ref 26.0–34.0)
MCHC: 30.1 g/dL (ref 30.0–36.0)
MCV: 86.8 fL (ref 78.0–100.0)
Platelets: 521 10*3/uL — ABNORMAL HIGH (ref 150–400)
RBC: 3.41 MIL/uL — AB (ref 3.87–5.11)
RDW: 18.1 % — ABNORMAL HIGH (ref 11.5–15.5)
WBC: 27.4 10*3/uL — ABNORMAL HIGH (ref 4.0–10.5)

## 2017-01-23 LAB — HEPATITIS C VRS RNA DETECT BY PCR-QUAL: HEPATITIS C VRS RNA BY PCR-QUAL: NEGATIVE

## 2017-01-23 MED ORDER — SODIUM CHLORIDE 0.9 % IV SOLN
INTRAVENOUS | Status: DC
  Administered 2017-01-23: 15:00:00 via INTRAVENOUS

## 2017-01-23 NOTE — Plan of Care (Signed)
Problem: Bowel/Gastric: Goal: Will not experience complications related to bowel motility Outcome: Progressing Giving laxatives to help get bowels moving. Hasn't had BM since 2/26.

## 2017-01-23 NOTE — Consult Note (Signed)
CONSULT NOTE  Date: 01/23/2017               Patient Name:  Rachel Butler MRN: [id number]  DOB: 24-Nov-1956 Age / Sex: 61 y.o., female        PCP: Viviana Simpler Primary Cardiologist: Caryl Comes ( 2016)             Referring Physician: Sloan Leiter              Reason for Consult: MV endocarditis, severe mitral regurgitation           History of Present Illness: Patient is a 61 y.o. female with a PMHx of liver mass , pulmonary HTN  who was admitted to [facility] on 01/18/2017 for evaluation of fever, tachycardia , and hypotension following a liver biopsy.  An echo showed a possible MV vegetation associated with severe MR. We were asked to see her for further evaluation of this.  She's not had any cardiac problems. She is a heavy smoker. She has seen Dr. Caryl Comes in the past Jan. 2016 )  for shortness of breath and volume retention. Echocardiogram at that time revealed mildly depressed left ventricular systolic function. She did not have pulmonary hypertension at that time. Her estimated PA pressure was 28 mmHg.  Her repeat echocardiogram performed on 01/22/2017 revealed a normal left ventricle systolic function with an ejection fraction of 60-65%. She has grade 1 diastolic dysfunction. She has moderate tricuspid regurgitation with an estimated PA pressure of 56 mm of work. This is moderate pulmonary hypertension. She has severe mitral regurgitation with a possible vegetation associated with the anterior mitral leaflet.  She's had progressively weight loss over the past years. Several years ago weight decreased as low as 86 pounds. She then regained back up to 120 and now has lost oxide 20 pounds over the past 6-12 months. Her appetite remains good but she just is losing weight. She's also had repeated fevers. She's not had any cardiac symptoms.  She had a liver biopsy up proximally 5 days ago. The results of the biopsy have suggested that this is metastatic breast cancer. She has never had  mammograms.  She smokes one half packs of cigarettes a day.  Her family history is positive for coronary artery disease-her father had coronary artery bypass grafting in his 59s.  Medications: Outpatient medications: Prescriptions Prior to Admission  Medication Sig Dispense Refill Last Dose  . Aspirin-Salicylamide-Caffeine (BC HEADACHE POWDER PO) Take 1 Package by mouth 2 (two) times daily. Pain   01/17/2017 at Unknown time  . BIOTIN 5000 PO Take by mouth daily.   01/18/2017 at 0800  . fentaNYL (DURAGESIC - DOSED MCG/HR) 25 MCG/HR patch Place 25 mcg onto the skin every 3 (three) days.   0 01/17/2017 at Unknown time  . furosemide (LASIX) 40 MG tablet TAKE 1 TABLET BY MOUTH ONCE A DAY 30 tablet 0 01/18/2017 at Unknown time  . gabapentin (NEURONTIN) 800 MG tablet Take 1 tablet (800 mg total) by mouth 3 (three) times daily. 90 tablet 11 01/18/2017 at Unknown time  . HYDROcodone-acetaminophen (NORCO/VICODIN) 5-325 MG per tablet Take 1 tablet by mouth 3 (three) times daily as needed for pain.   01/17/2017 at Unknown time  . LORazepam (ATIVAN) 0.5 MG tablet 1-2 tablets 1 hour before MRI. May repeat as needed. 4 tablet 0 Past Week at Unknown time  . Multiple Vitamins-Minerals (MULTIVITAMIN WITH MINERALS) tablet Take 1 tablet by mouth daily.   01/18/2017 at Unknown time  .  promethazine (PHENERGAN) 25 MG tablet TAKE 0.5-1 TABLETS (12.5-25 MG TOTAL) BY MOUTH 3 (THREE) TIMES DAILY AS NEEDED FOR NAUSEA. 30 tablet 1 Past Week at Unknown time  . sennosides-docusate sodium (SENOKOT-S) 8.6-50 MG tablet Take 2 tablets by mouth daily.   01/17/2017 at Unknown time    Current medications: Current Facility-Administered Medications  Medication Dose Route Frequency Provider Last Rate Last Dose  . 0.9 %  sodium chloride infusion   Intravenous Once Jonetta Osgood, MD      . acetaminophen (TYLENOL) tablet 650 mg  650 mg Oral Once Jonetta Osgood, MD      . albuterol (PROVENTIL) (2.5 MG/3ML) 0.083% nebulizer solution  2.5 mg  2.5 mg Nebulization Q2H PRN Jonetta Osgood, MD      . ciprofloxacin (CIPRO) tablet 500 mg  500 mg Oral BID Florencia Reasons, MD   500 mg at 01/23/17 [id number]  . diphenhydrAMINE (BENADRYL) injection 25 mg  25 mg Intravenous Once Jonetta Osgood, MD      . fentaNYL ([place] - dosed mcg/hr) patch 25 mcg  25 mcg Transdermal Q72H Florencia Reasons, MD   25 mcg at 01/20/17 2107  . fentaNYL (SUBLIMAZE) injection 25 mcg  25 mcg Intravenous Q2H PRN Rise Patience, MD   25 mcg at 01/19/17 2014  . gabapentin (NEURONTIN) capsule 400 mg  400 mg Oral TID Honor Loh, RPH   400 mg at 01/23/17 [id number]  . HYDROcodone-acetaminophen (NORCO/VICODIN) 5-325 MG per tablet 1-2 tablet  1-2 tablet Oral Q6H PRN Saverio Danker, PA-C   1 tablet at 01/23/17 0735  . metroNIDAZOLE (FLAGYL) tablet 500 mg  500 mg Oral Q8H Florencia Reasons, MD   500 mg at 01/23/17 0608  . ondansetron (ZOFRAN) tablet 4 mg  4 mg Oral Q6H PRN Jonetta Osgood, MD   4 mg at 01/21/17 1844   Or  . ondansetron [facility]) injection 4 mg  4 mg Intravenous Q6H PRN Jonetta Osgood, MD   4 mg at 01/19/17 0006  . polyethylene glycol (MIRALAX / GLYCOLAX) packet 17 g  17 g Oral Daily Florencia Reasons, MD   17 g at 01/23/17 [id number]  . senna-docusate (Senokot-S) tablet 2 tablet  2 tablet Oral QHS Jonetta Osgood, MD   2 tablet at 01/22/17 2125  . sodium chloride flush (NS) 0.9 % injection 3 mL  3 mL Intravenous Q12H Saverio Danker, PA-C   3 mL at 01/23/17 0839  . sodium chloride flush (NS) 0.9 % injection 3 mL  3 mL Intravenous Q12H Jonetta Osgood, MD   3 mL at 01/23/17 0839     Allergies  Allergen Reactions  . Endoxcin [Lidocaine-Menthol]   . Nubain [Nalbuphine Hcl]   . Penicillins   . Robaxin [Methocarbamol]      Past Medical History:  Diagnosis Date  . Cervical disc disease   . Lumbar disc disease   . Migraines   . Nicotine dependence   . UTI (urinary tract infection)     Past Surgical History:  Procedure Laterality Date  . ABDOMINAL HYSTERECTOMY  1986   right  tube and ovary removed--then laparoscopy after for ?retained cyst?  . CERVICAL DISCECTOMY  1993   C5-6  . CESAREAN SECTION  [id number]  . COLONOSCOPY    . IR GENERIC HISTORICAL  01/18/2017   IR EMBO ART  VEN HEMORR LYMPH EXTRAV  INC GUIDE ROADMAPPING 01/18/2017 Greggory Keen, MD [facility]  . IR GENERIC HISTORICAL  01/18/2017   IR ANGIOGRAM  SELECTIVE EACH ADDITIONAL VESSEL 01/18/2017 Greggory Keen, MD [facility]  . IR GENERIC HISTORICAL  01/18/2017   IR ANGIOGRAM SELECTIVE EACH ADDITIONAL VESSEL 01/18/2017 Greggory Keen, MD [facility]  . IR GENERIC HISTORICAL  01/18/2017   IR US GUIDE VASC ACCESS RIGHT 01/18/2017 Greggory Keen, MD [facility]  . IR GENERIC HISTORICAL  01/18/2017   IR ANGIOGRAM VISCERAL SELECTIVE 01/18/2017 Greggory Keen, MD [facility]  . IR GENERIC HISTORICAL  01/18/2017   IR ANGIOGRAM SELECTIVE EACH ADDITIONAL VESSEL 01/18/2017 Greggory Keen, MD [facility]  . IR GENERIC HISTORICAL  01/18/2017   IR ANGIOGRAM VISCERAL SELECTIVE 01/18/2017 Greggory Keen, MD [facility]  . TONSILLECTOMY AND ADENOIDECTOMY  1970  . TUBAL LIGATION      Family History  Problem Relation Age of Onset  . Cancer Mother   . COPD Father   . Heart disease Father   . Hypertension Father   . Thyroid disease Other   . Cancer Other     mother's side  . Heart disease Other     Dad's side  . Colon cancer Neg Hx     Social History:  reports that she has been smoking Cigarettes.  She has smoked for the past 0.00 years. She has never used smokeless tobacco. She reports that she does not drink alcohol or use drugs.   Review of Systems: Constitutional:  admits to fever, weight loss and  fatigue.  HEENT: denies photophobia, eye pain, redness, hearing loss, ear pain, congestion, sore throat, rhinorrhea, sneezing, neck pain, neck stiffness and tinnitus.  Respiratory: denies SOB, DOE, cough, chest tightness, and wheezing.  Cardiovascular: denies chest pain, palpitations and leg swelling.    Gastrointestinal: denies nausea, vomiting, abdominal pain, diarrhea, constipation, blood in stool.  Genitourinary: denies dysuria, urgency, frequency, hematuria, flank pain and difficulty urinating.  Musculoskeletal: denies  myalgias, back pain, joint swelling, arthralgias and gait problem.   Skin: denies pallor, rash and wound.  Neurological: denies dizziness, seizures, syncope, weakness, light-headedness, numbness and headaches.   Hematological: denies adenopathy, easy bruising, personal or family bleeding history.  Psychiatric/ Behavioral: denies suicidal ideation, mood changes, confusion, nervousness, sleep disturbance and agitation.    Physical Exam: BP 135/82 (BP Location: Left Arm)   Pulse 97   Temp 98 F (36.7 C) (Oral)   Resp 15   Ht 5\' 3"  (1.6 m)   Wt 103 lb 6.4 oz (46.9 kg)   SpO2 99%   BMI 18.32 kg/m   Wt Readings from Last 3 Encounters:  01/23/17 103 lb 6.4 oz (46.9 kg)  01/16/17 97 lb (44 kg)  01/12/17 101 lb (45.8 kg)    General: Vital signs reviewed and noted. Well-developed, well-nourished, in no acute distress; alert,   Head: Normocephalic, atraumatic, sclera anicteric,   Neck: Supple. Negative for carotid bruits. No JVD   Lungs:  Clear bilaterally, no  wheezes, rales, or rhonchi. Breathing is normal   Heart: RRR with S1 S2. Soft systolic  murmur, rubs, or gallops   Abdomen/ GI :  Soft, non-tender, non-distended with normoactive bowel sounds. No hepatomegaly. No rebound/guarding. No obvious abdominal masses   MSK: Strength and the appear normal for age.   Extremities: No clubbing or cyanosis. No edema.  Distal pedal pulses are 2+ and equal   Neurologic:  CN are grossly intact,  No obvious motor or sensory defect.  Alert and oriented X 3. Moves all extremities spontaneously.  Psych: Responds to questions appropriately with a normal affect.     Lab results:  Basic Metabolic Panel:  Recent Labs Lab 01/20/17 0511 01/21/17 0352 01/22/17 0450  NA 138 135  135  K 3.8 3.5 4.0  CL 95* 94* 94*  CO2 34* 33* 30  GLUCOSE 111* 88 87  BUN 6 <5* <5*  CREATININE 0.35* 0.37* 0.36*  CALCIUM 8.8* 8.4* 8.4*  MG 1.9  --   --     Liver Function Tests:  Recent Labs Lab 01/20/17 0511 01/21/17 0352 01/22/17 0450  AST 56* 37 31  ALT 20 17 16   ALKPHOS 138* 130* 134*  BILITOT 0.3 0.4 0.6  PROT 6.3* 5.8* 6.0*  ALBUMIN 1.9* 1.7* 1.7*   No results for input(s): LIPASE, AMYLASE in the last 168 hours. No results for input(s): AMMONIA in the last 168 hours.  CBC:  Recent Labs Lab 01/19/17 1137 01/19/17 1830 01/20/17 0511 01/20/17 1127 01/21/17 0352 01/22/17 0450  WBC 33.7* 33.9* 32.8*  --  26.6* 24.5*  NEUTROABS  --   --  28.9*  --  22.8* 19.8*  HGB 8.1* 7.6* 6.9* 8.4* 8.4* 8.5*  HCT 26.7* 25.6* 22.8* 27.5* 27.2* 28.3*  MCV 84.2 84.8 85.7  --  85.8 86.0  PLT 712* 683* 584*  --  455* 464*    Cardiac Enzymes:  Recent Labs Lab 01/18/17 1558 01/19/17 0639  TROPONINI 0.03* 0.03*    BNP: Invalid input(s): POCBNP  CBG: No results for input(s): GLUCAP in the last 168 hours.  Coagulation Studies: No results for input(s): LABPROT, INR in the last 72 hours.   Other results: Personal review of EKG shows :  - Jan. 23, 2018:   Sinus tach at 123.  She has 1-2 mm St depression in the inf. And lateral leads.    Imaging: Ct Angio Chest Pe W Or Wo Contrast  Result Date: 01/22/2017 CLINICAL DATA:  Hypoxia. EXAM: CT ANGIOGRAPHY CHEST WITH CONTRAST TECHNIQUE: Multidetector CT imaging of the chest was performed using the standard protocol during bolus administration of intravenous contrast. Multiplanar CT image reconstructions and MIPs were obtained to evaluate the vascular anatomy. CONTRAST:  80 mL Isovue 370 COMPARISON:  One-view chest x-ray 01/18/2017. CT of the abdomen and pelvis 01/20/2017 FINDINGS: Cardiovascular: The heart size is normal. The pulmonary arteries demonstrate excellent opacification. There is focal filling defect to suggest  pulmonary embolus. Pulmonary arterial size is normal. The aorta demonstrates atherosclerotic change without aneurysm. A 4 vessel arch configuration is present. The left vertebral artery Mediastinum/Nodes: No significant mediastinal or axillary adenopathy is present. The esophagus is unremarkable. Secretions versus small tracheal nodules are evident. Lungs/Pleura: Centrilobular emphysema is present. No focal nodule or mass lesion is present. Mild dependent atelectasis is present bilaterally. There are small bilateral pleural effusions. Upper Abdomen: Liver metastases are again noted. Musculoskeletal: No focal lytic or blastic lesions are present. Vertebral body heights alignment are maintained. Review of the MIP images confirms the above findings. IMPRESSION: 1. No pulmonary embolus. 2. Centrilobular emphysema. 3. Secretions versus small tracheal nodules. Short-term follow-up CT scan of or bronchoscopy would be useful for further evaluation. 4. Aortic atherosclerosis. 5. Liver metastases. Electronically Signed   By: San Morelle M.D.   On: 01/22/2017 09:53       Assessment & Plan:   1. Possible mitral valve vegetation: Her blood cultures have returned negative. She had has had a history of some fevers and weight loss but this also could be related to her metastatic cancer. This could be a case of [place] Long endocarditis associated with this metastatic cancer. I think  that a transesophageal echo may be beneficial in helping [place] identify this further. Underlying treatment would involve treatment of her underlying cancer. I do not think that there is a role for anticoagulation.  2. Mitral regurgitation: She has fairly severe mitral regurgitation. At this point I do not think that she is a candidate for mitral valve repair or replacement given her metastatic cancer. I think we would want to see her response to therapy before we committed to mitral valve repair or replacement. Fortunately, she is  basically asymptomatic and is not having too much short of breath; Will set her up for a TEE tomorrow Ive explained the procedure, risks, benefits.  She understands and agrees to proceed.  2. Moderate pulmonary hypertension: I suspect that she has some underlying lung disease. The CT  Angiogram  did not show any evidence of pulmonary embolus. She probably has COPD.     Thayer Headings, Brooke Bonito., MD, [facility] 01/23/2017, 10:51 AM Office - [phone] Pager [phone]

## 2017-01-23 NOTE — Progress Notes (Signed)
Patient ID: Rachel Butler, female   DOB: 1956/01/05, 61 y.o.   MRN: [id number]    Referring Physician(s): [place] I  Supervising Physician: Markus Daft  Patient Status: [facility] - In-pt  Chief Complaint: Bleed, s/p liver bx  Subjective: Patient is feeling much better.  Feels less bloated.    Allergies: Endoxcin [lidocaine-menthol]; Nubain [nalbuphine hcl]; Penicillins; and Robaxin [methocarbamol]  Medications: Prior to Admission medications   Medication Sig Start Date End Date Taking? Authorizing Provider  Aspirin-Salicylamide-Caffeine (BC HEADACHE POWDER PO) Take 1 Package by mouth 2 (two) times daily. Pain   Yes Historical Provider, MD  BIOTIN 5000 PO Take by mouth daily.   Yes Historical Provider, MD  fentaNYL (DURAGESIC - DOSED MCG/HR) 25 MCG/HR patch Place 25 mcg onto the skin every 3 (three) days.  12/13/14  Yes Historical Provider, MD  furosemide (LASIX) 40 MG tablet TAKE 1 TABLET BY MOUTH ONCE A DAY 12/20/16  Yes Venia Carbon, MD  gabapentin (NEURONTIN) 800 MG tablet Take 1 tablet (800 mg total) by mouth 3 (three) times daily. 01/09/17  Yes Venia Carbon, MD  HYDROcodone-acetaminophen (NORCO/VICODIN) 5-325 MG per tablet Take 1 tablet by mouth 3 (three) times daily as needed for pain.   Yes Historical Provider, MD  LORazepam (ATIVAN) 0.5 MG tablet 1-2 tablets 1 hour before MRI. May repeat as needed. 01/14/17  Yes Venia Carbon, MD  Multiple Vitamins-Minerals (MULTIVITAMIN WITH MINERALS) tablet Take 1 tablet by mouth daily.   Yes Historical Provider, MD  promethazine (PHENERGAN) 25 MG tablet TAKE 0.5-1 TABLETS (12.5-25 MG TOTAL) BY MOUTH 3 (THREE) TIMES DAILY AS NEEDED FOR NAUSEA. 06/28/16  Yes Venia Carbon, MD  sennosides-docusate sodium (SENOKOT-S) 8.6-50 MG tablet Take 2 tablets by mouth daily.   Yes Historical Provider, MD  polyethylene glycol (MIRALAX / GLYCOLAX) packet Take 17 g by mouth daily. 01/23/17   Florencia Reasons, MD    Vital Signs: BP 135/82 (BP Location:  Left Arm)   Pulse 97   Temp 98 F (36.7 C) (Oral)   Resp 15   Ht _0  (1.6 m)   Wt 103 lb 6.4 oz (46.9 kg)   SpO2 99%   BMI 18.32 kg/m   Physical Exam: Abd: soft, less distended.  All puncture sites in abdomen and right groin are healing well with no evidence of infection or bleeding.  Imaging: Ct Angio Chest Pe W Or Wo Contrast  Result Date: 01/22/2017 CLINICAL DATA:  Hypoxia. EXAM: CT ANGIOGRAPHY CHEST WITH CONTRAST TECHNIQUE: Multidetector CT imaging of the chest was performed using the standard protocol during bolus administration of intravenous contrast. Multiplanar CT image reconstructions and MIPs were obtained to evaluate the vascular anatomy. CONTRAST:  80 mL Isovue 370 COMPARISON:  One-view chest x-ray 01/18/2017. CT of the abdomen and pelvis 01/20/2017 FINDINGS: Cardiovascular: The heart size is normal. The pulmonary arteries demonstrate excellent opacification. There is focal filling defect to suggest pulmonary embolus. Pulmonary arterial size is normal. The aorta demonstrates atherosclerotic change without aneurysm. A 4 vessel arch configuration is present. The left vertebral artery Mediastinum/Nodes: No significant mediastinal or axillary adenopathy is present. The esophagus is unremarkable. Secretions versus small tracheal nodules are evident. Lungs/Pleura: Centrilobular emphysema is present. No focal nodule or mass lesion is present. Mild dependent atelectasis is present bilaterally. There are small bilateral pleural effusions. Upper Abdomen: Liver metastases are again noted. Musculoskeletal: No focal lytic or blastic lesions are present. Vertebral body heights alignment are maintained. Review of the MIP images confirms the above  findings. IMPRESSION: 1. No pulmonary embolus. 2. Centrilobular emphysema. 3. Secretions versus small tracheal nodules. Short-term follow-up CT scan of or bronchoscopy would be useful for further evaluation. 4. Aortic atherosclerosis. 5. Liver metastases.  Electronically Signed   By: San Morelle M.D.   On: 01/22/2017 09:53   Ct Angio Abd/pel W/ And/or W/o  Result Date: 01/20/2017 CLINICAL DATA:  Hepatic metastases, status post ultrasound percutaneous biopsy which was complicated by acute bleeding 2 hours after the biopsy. Patient subsequently underwent Gel-Foam embolization of left hepatic artery. Despite transfusions, further decrease in hemoglobin this morning. EXAM: CT ANGIOGRAPHY ABDOMEN AND PELVIS TECHNIQUE: Multidetector CT imaging of the abdomen and pelvis was performed using the standard protocol during bolus administration of intravenous contrast. Multiplanar reconstructed images including MIPs were obtained and reviewed to evaluate the vascular anatomy. CONTRAST:  100 cc Isovue COMPARISON:  01/18/2017 FINDINGS: Arterial findings: Aorta: Scattered moderate atherosclerosis of the aorta, most pronounced in the infrarenal aorta without occlusion, aneurysm or dissection. No retroperitoneal hemorrhage. Celiac axis: Atherosclerotic origin but remains patent. Splenic, left gastric, and hepatic branches appear patent. No active bleeding or acute vascular process. Superior mesenteric: Atherosclerotic origin but remains patent. SMA branches appear patent. No active bleeding. Left renal:           Mild atherosclerosis but widely patent. Right renal:          Mild atherosclerosis but widely patent. Inferior mesenteric:  Atherosclerotic origin but remains patent. Left iliac: Iliac atherosclerosis noted without occlusion. Left common, internal and external iliac arteries are patent. Right iliac: Similar atherosclerosis without occlusion. Right common, internal and external iliac arteries are patent. Venous findings: Hepatic veins, portal, splenic, mesenteric veins are patent. No veno-occlusive process. IVC, renal veins common iliac veins are patent. Review of the MIP images confirms the above findings. Nonvascular findings: Basilar emphysema noted. Minor  atelectasis. Normal heart size. Trace pleural effusions. No pericardial effusion. Hypodense peripherally enhancing hepatic masses again evident compatible with metastases. No evidence of active bleeding or acute vascular process within the liver. No biliary dilatation. Gallbladder is collapsed but contains partially calcified gallstones. Moderate Heterogeneous perihepatic free fluid, compatible with a mixture of ascites and known hemoperitoneum related to the recent left hepatic mass biopsy. Spleen is unremarkable. Small amount of hemoperitoneum about the spleen upper pole. Adrenal glands, kidneys, ureters, and bladder demonstrate no acute process, obstruction or hydronephrosis. Negative for bowel obstruction, significant dilatation, ileus, or free air. No adenopathy Remote hysterectomy.  Pelvic ascites also evident. No inguinal abnormality or hernia. Intact abdominal wall. No rectus sheath hemorrhage or hematoma over the left hepatic biopsy site. Degenerative changes of the spine. Bones are mildly osteopenic. No acute osseous finding. IMPRESSION: Negative for acute or active intra-abdominal bleeding. Stable diffuse hepatic metastatic process. Stable perihepatic ascites and hemoperitoneum related to the recent left hepatic mass biopsy. Intact abdominal wall without rectus sheath hematoma or hemorrhage. Aortoiliac atherosclerosis Cholelithiasis Pelvic ascites as well These results were called by telephone at the time of interpretation on 01/20/2017 at 2:13 pm to Dr. Florencia Reasons , who verbally acknowledged these results. Electronically Signed   By: Jerilynn Mages.  Shick M.D.   On: 01/20/2017 14:15    Labs:  CBC:  Recent Labs  01/19/17 1830 01/20/17 0511 01/20/17 1127 01/21/17 0352 01/22/17 0450  WBC 33.9* 32.8*  --  26.6* 24.5*  HGB 7.6* 6.9* 8.4* 8.4* 8.5*  HCT 25.6* 22.8* 27.5* 27.2* 28.3*  PLT 683* 584*  --  455* 464*    COAGS:  Recent Labs  01/18/17 0005 01/18/17 1134  INR 1.27 1.11  APTT 32 36     BMP:  Recent Labs  01/19/17 0639 01/20/17 0511 01/21/17 0352 01/22/17 0450  NA 138 138 135 135  K 3.5 3.8 3.5 4.0  CL 94* 95* 94* 94*  CO2 31 34* 33* 30  GLUCOSE 144* 111* 88 87  BUN 7 6 <5* <5*  CALCIUM 8.3* 8.8* 8.4* 8.4*  CREATININE 0.70 0.35* 0.37* 0.36*  GFRNONAA >60 >60 >60 >60  GFRAA >60 >60 >60 >60    LIVER FUNCTION TESTS:  Recent Labs  01/19/17 0639 01/20/17 0511 01/21/17 0352 01/22/17 0450  BILITOT 0.4 0.3 0.4 0.6  AST 50* 56* 37 31  ALT _0 ALKPHOS 148* 138* 130* 134*  PROT 6.7 6.3* 5.8* 6.0*  ALBUMIN 1.9* 1.9* 1.7* 1.7*    Assessment and Plan: 1. Liver lesion bx with post biopsy hemorrhage, s/p mesenteric angiogram with embolization. -patient's hgb is stable. 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  Electronically Signed: Henreitta Cea 01/23/2017, 10:24 AM   I spent a total of 15 Minutes at the the patient's bedside AND on the patient's hospital floor or unit, greater than 50% of which was counseling/coordinating care for hemorrhage after liver bx

## 2017-01-23 NOTE — Progress Notes (Signed)
PROGRESS NOTE        PATIENT DETAILS Name: Rachel Butler Age: 61 y.o. Sex: female Date of Birth: December 07, 1955 Admit Date: 01/18/2017 Admitting Physician Evalee Mutton Kristeen Mans, MD KH:[id number] Silvio Pate, MD  Brief Narrative: Patient is a 61 y.o. female with past medical history of chronic pain syndrome-due to degenerative disc disease on chronic narcotics, long-standing history of tobacco use-unexplained weight loss-diagnosed with multiple liver lesions on an outpatient MRI-admitted following an episode of hypotension, tachycardia post liver biopsy. Hospital course has been complicated by fever, tachycardia, and acute blood loss anemia. See below for further details  Subjective: Pain is adequate controlled.  Assessment/Plan: Hypotension-due to perihepatic hemorrhage post liver biopsy: I do not think that the patient had sepsis on admission, suspect hypertension was probably related to acute blood loss post liver biopsy. Blood cultures are negative-she was empirically covered with antibiotics-these have now been stopped on 2/28. Her blood pressure is now stable, she is status post numerous units of PRBC and hepatic artery embolization by IR.  Acute blood loss anemia: Secondary to above. Required PRBC transfusion, hemoglobin currently stable.  Fever/tachycardia: Thankfully afebrile, blood cultures negative. Could have more fever, tachycardia could have been from intractable pain and anemia. Continue to follow CBC, continue to optimize pain control.  Severe MR with possible vegetation in anterior mitral leaflet: TEE tentatively scheduled for 2/29. With blood cultures being negative-and with underlying malignancy, high suspicion for Libman-Sacks endocarditis. Await TEE to further delineate lesion. In regards to severe MR-with her advanced malignancy, she probably does not need further workup for CT surgery evaluation.  Persistent leukocytosis with thrombocytosis: Initially  thought to have infection-but with negative blood cultures, and underlying malignancy-suspect she has leukemoid reaction and reactive thrombocytosis due to her underlying malignancy issues.  Multiple Liver Lesions: Underwent liver biopsy on 2/23--pathology suggestive of adenocarcinoma-with breast primary. Further evaluation will be deferred to the outpatient setting  Chronic Back Pain with chronic narcotic dependence: follows with pain medicine at [facility] held briefly due to initial hypotension-resumed transdermal fentanyl and Percocet-pain adequately controlled  COPD: stable, no wheezing, no cough  Nicotine dependence:. Smoking cessation education provided.  DVT Prophylaxis: SCD's  Code Status:  DNR  Family Communication: Spouse at bedside  Disposition Plan: Remain inpatient-home soon  Antimicrobial agents: Anti-infectives    Start     Dose/Rate Route Frequency Ordered Stop   01/21/17 2200  metroNIDAZOLE (FLAGYL) tablet 500 mg  Status:  Discontinued     500 mg Oral Every 8 hours 01/21/17 1814 01/23/17 1435   01/21/17 2000  ciprofloxacin (CIPRO) tablet 500 mg  Status:  Discontinued     500 mg Oral 2 times daily 01/21/17 1814 01/23/17 1435   01/18/17 2130  vancomycin (VANCOCIN) IVPB 1000 mg/200 mL premix  Status:  Discontinued     1,000 mg 200 mL/hr over 60 Minutes Intravenous Every 24 hours 01/18/17 2045 01/20/17 1410   01/18/17 2130  aztreonam (AZACTAM) 1 g in dextrose 5 % 50 mL IVPB  Status:  Discontinued     1 g 100 mL/hr over 30 Minutes Intravenous Every 8 hours 01/18/17 2045 01/21/17 1814      Procedures: Echo 2/7 - LVEF 60-65%, normal wall thickness, normal wall motion, diastolic   dysfunction with elevated LV filling pressure, severe MR with   possible vegetation of the anterior mitral leaflet, moderate TR,   RVSP 56  mmHg. TEE is strongly recommended to further characterize   the etiology of severe mitral valve regurgitation, however,   vegetation on the  mitral valve is suspected.  CONSULTS:  cardiology, ID and IR  Time spent: 25- minutes-Greater than 50% of this time was spent in counseling, explanation of diagnosis, planning of further management, and coordination of care.  MEDICATIONS: Scheduled Meds: . diphenhydrAMINE  25 mg Intravenous Once  . fentaNYL  25 mcg Transdermal Q72H  . gabapentin  400 mg Oral TID  . polyethylene glycol  17 g Oral Daily  . senna-docusate  2 tablet Oral QHS  . sodium chloride flush  3 mL Intravenous Q12H  . sodium chloride flush  3 mL Intravenous Q12H   Continuous Infusions: PRN Meds:.albuterol, HYDROcodone-acetaminophen, ondansetron **OR** ondansetron (ZOFRAN) IV   PHYSICAL EXAM: Vital signs: Vitals:   01/22/17 2200 01/23/17 0044 01/23/17 0443 01/23/17 0444  BP:      Pulse: 91 97    Resp: 11 15    Temp:   98 F (36.7 C)   TempSrc:   Oral   SpO2: 100% 99%    Weight:    46.9 kg (103 lb 6.4 oz)  Height:    5\' 3"  (1.6 m)   Filed Weights   01/21/17 0500 01/22/17 0241 01/23/17 0444  Weight: 47.4 kg (104 lb 6.4 oz) 47.6 kg (104 lb 14.4 oz) 46.9 kg (103 lb 6.4 oz)   Body mass index is 18.32 kg/m.   General appearance :Awake, alert, not in any distress. Speech Clear. Not toxic Looking Eyes:, pupils equally reactive to light and accomodation,no scleral icterus. HEENT: Atraumatic and Normocephalic Neck: supple, no JVD. No cervical lymphadenopathy.  Resp:Good air entry bilaterally, no added sounds  CVS: S1 S2 regular-But tachycardic, no murmurs.  GI: Bowel sounds present, Non tender and not distended with no gaurding, rigidity or rebound Extremities: B/L Lower Ext shows no edema, both legs are warm to touch Neurology:  speech clear,Non focal, sensation is grossly intact. Psychiatric: Normal judgment and insight. Alert and oriented x 3. Normal mood. Musculoskeletal:No digital cyanosis Skin:No Rash, warm and dry Wounds:N/A  I have personally reviewed following labs and imaging  studies  LABORATORY DATA: CBC:  Recent Labs Lab 01/19/17 0631  01/19/17 1830 01/20/17 0511 01/20/17 1127 01/21/17 0352 01/22/17 0450 01/23/17 1047  WBC 32.2*  < > 33.9* 32.8*  --  26.6* 24.5* 27.4*  NEUTROABS 27.6*  --   --  28.9*  --  22.8* 19.8*  --   HGB 8.2*  < > 7.6* 6.9* 8.4* 8.4* 8.5* 8.9*  HCT 27.0*  < > 25.6* 22.8* 27.5* 27.2* 28.3* 29.6*  MCV 84.1  < > 84.8 85.7  --  85.8 86.0 86.8  PLT 770*  < > 683* 584*  --  455* 464* 521*  < > = values in this interval not displayed.  Basic Metabolic Panel:  Recent Labs Lab 01/18/17 1614 01/19/17 0639 01/20/17 0511 01/21/17 0352 01/22/17 0450  NA 137 138 138 135 135  K 2.7* 3.5 3.8 3.5 4.0  CL 87* 94* 95* 94* 94*  CO2 34* 31 34* 33* 30  GLUCOSE 146* 144* 111* 88 87  BUN 5* 7 6 <5* <5*  CREATININE 0.86 0.70 0.35* 0.37* 0.36*  CALCIUM 8.7* 8.3* 8.8* 8.4* 8.4*  MG  --   --  1.9  --   --     GFR: Estimated Creatinine Clearance: 56.8 mL/min (by C-G formula based on SCr of 0.36 mg/dL (L)).  Liver Function Tests:  Recent Labs Lab 01/18/17 1614 01/19/17 0639 01/20/17 0511 01/21/17 0352 01/22/17 0450  AST 31 50* 56* 37 31  ALT 12* 15 20 17 16   ALKPHOS 186* 148* 138* 130* 134*  BILITOT 0.7 0.4 0.3 0.4 0.6  PROT 7.3 6.7 6.3* 5.8* 6.0*  ALBUMIN 2.0* 1.9* 1.9* 1.7* 1.7*   No results for input(s): LIPASE, AMYLASE in the last 168 hours. No results for input(s): AMMONIA in the last 168 hours.  Coagulation Profile:  Recent Labs Lab 01/18/17 0005 01/18/17 1134  INR 1.27 1.11    Cardiac Enzymes:  Recent Labs Lab 01/18/17 1558 01/19/17 0639  TROPONINI 0.03* 0.03*    BNP (last 3 results) No results for input(s): PROBNP in the last 8760 hours.  HbA1C: No results for input(s): HGBA1C in the last 72 hours.  CBG: No results for input(s): GLUCAP in the last 168 hours.  Lipid Profile: No results for input(s): CHOL, HDL, LDLCALC, TRIG, CHOLHDL, LDLDIRECT in the last 72 hours.  Thyroid Function Tests: No  results for input(s): TSH, T4TOTAL, FREET4, T3FREE, THYROIDAB in the last 72 hours.  Anemia Panel: No results for input(s): VITAMINB12, FOLATE, FERRITIN, TIBC, IRON, RETICCTPCT in the last 72 hours.  Urine analysis:    Component Value Date/Time   COLORURINE YELLOW 01/19/2017 0625   APPEARANCEUR CLEAR 01/19/2017 0625   LABSPEC >1.046 (H) 01/19/2017 0625   PHURINE 5.0 01/19/2017 0625   GLUCOSEU NEGATIVE 01/19/2017 0625   HGBUR NEGATIVE 01/19/2017 0625   BILIRUBINUR NEGATIVE 01/19/2017 0625   BILIRUBINUR neg 08/17/2015 1211   KETONESUR NEGATIVE 01/19/2017 0625   PROTEINUR NEGATIVE 01/19/2017 0625   UROBILINOGEN negative 08/17/2015 1211   UROBILINOGEN 1.0 12/05/2014 1112   NITRITE NEGATIVE 01/19/2017 0625   LEUKOCYTESUR NEGATIVE 01/19/2017 0625    Sepsis Labs: Lactic Acid, Venous    Component Value Date/Time   LATICACIDVEN 1.8 01/18/2017 2351    MICROBIOLOGY: Recent Results (from the past 240 hour(s))  Culture, blood (Routine X 2) w Reflex to ID Panel     Status: None (Preliminary result)   Collection Time: 01/18/17  5:50 PM  Result Value Ref Range Status   Specimen Description BLOOD LEFT ANTECUBITAL  Final   Special Requests BOTTLES DRAWN AEROBIC AND ANAEROBIC 4CC  Final   Culture NO GROWTH 4 DAYS  Final   Report Status PENDING  Incomplete  Culture, blood (Routine X 2) w Reflex to ID Panel     Status: None (Preliminary result)   Collection Time: 01/18/17  6:12 PM  Result Value Ref Range Status   Specimen Description BLOOD LEFT HAND  Final   Special Requests BOTTLES DRAWN AEROBIC AND ANAEROBIC 3CC  Final   Culture NO GROWTH 4 DAYS  Final   Report Status PENDING  Incomplete  MRSA PCR Screening     Status: None   Collection Time: 01/18/17  8:50 PM  Result Value Ref Range Status   MRSA by PCR NEGATIVE NEGATIVE Final    Comment:        The GeneXpert MRSA Assay (FDA approved for NASAL specimens only), is one component of a comprehensive MRSA colonization surveillance  program. It is not intended to diagnose MRSA infection nor to guide or monitor treatment for MRSA infections.   Culture, blood (x 2)     Status: None (Preliminary result)   Collection Time: 01/18/17 11:51 PM  Result Value Ref Range Status   Specimen Description BLOOD RIGHT ARM  Final   Special Requests BOTTLES DRAWN AEROBIC AND  ANAEROBIC 3ML  Final   Culture NO GROWTH 3 DAYS  Final   Report Status PENDING  Incomplete  Culture, blood (x 2)     Status: None (Preliminary result)   Collection Time: 01/18/17 11:51 PM  Result Value Ref Range Status   Specimen Description BLOOD RIGHT ARM  Final   Special Requests BOTTLES DRAWN AEROBIC AND ANAEROBIC 3ML  Final   Culture NO GROWTH 3 DAYS  Final   Report Status PENDING  Incomplete  Culture, Urine     Status: None   Collection Time: 01/19/17  6:25 AM  Result Value Ref Range Status   Specimen Description URINE, RANDOM  Final   Special Requests NONE  Final   Culture NO GROWTH  Final   Report Status 01/20/2017 FINAL  Final    RADIOLOGY STUDIES/RESULTS: Dg Chest 2 View  Result Date: 01/16/2017 CLINICAL DATA:  Liver metastases.  Tobacco use. EXAM: CHEST  2 VIEW COMPARISON:  August 25, 2015 FINDINGS: There are nipple shadows bilaterally. There is no evident edema or consolidation. No pulmonary nodular lesion is demonstrable by radiography. Heart size and pulmonary vascularity are normal. No adenopathy. There is atherosclerotic calcification in the aorta. No blastic or lytic bone lesions. IMPRESSION: Aortic atherosclerosis. No edema or consolidation. No evident mass or adenopathy. Electronically Signed   By: Lowella Grip III M.D.   On: 01/16/2017 14:34   Ct Angio Chest Pe W Or Wo Contrast  Addendum Date: 01/23/2017   ADDENDUM REPORT: 01/23/2017 14:00 ADDENDUM: Voice recognition error: The third sentence of the Findings section should read "There is no focal filling defect to suggest pulmonary embolus." Electronically Signed   By:  San Morelle M.D.   On: 01/23/2017 14:00   Result Date: 01/23/2017 CLINICAL DATA:  Hypoxia. EXAM: CT ANGIOGRAPHY CHEST WITH CONTRAST TECHNIQUE: Multidetector CT imaging of the chest was performed using the standard protocol during bolus administration of intravenous contrast. Multiplanar CT image reconstructions and MIPs were obtained to evaluate the vascular anatomy. CONTRAST:  80 mL Isovue 370 COMPARISON:  One-view chest x-ray 01/18/2017. CT of the abdomen and pelvis 01/20/2017 FINDINGS: Cardiovascular: The heart size is normal. The pulmonary arteries demonstrate excellent opacification. There is focal filling defect to suggest pulmonary embolus. Pulmonary arterial size is normal. The aorta demonstrates atherosclerotic change without aneurysm. A 4 vessel arch configuration is present. The left vertebral artery Mediastinum/Nodes: No significant mediastinal or axillary adenopathy is present. The esophagus is unremarkable. Secretions versus small tracheal nodules are evident. Lungs/Pleura: Centrilobular emphysema is present. No focal nodule or mass lesion is present. Mild dependent atelectasis is present bilaterally. There are small bilateral pleural effusions. Upper Abdomen: Liver metastases are again noted. Musculoskeletal: No focal lytic or blastic lesions are present. Vertebral body heights alignment are maintained. Review of the MIP images confirms the above findings. IMPRESSION: 1. No pulmonary embolus. 2. Centrilobular emphysema. 3. Secretions versus small tracheal nodules. Short-term follow-up CT scan of or bronchoscopy would be useful for further evaluation. 4. Aortic atherosclerosis. 5. Liver metastases. Electronically Signed: By: San Morelle M.D. On: 01/22/2017 09:53   Mr Abdomen Wwo Contrast  Result Date: 01/15/2017 CLINICAL DATA:  Evaluate liver lesions noted on ultrasound. EXAM: MRI ABDOMEN WITHOUT AND WITH CONTRAST TECHNIQUE: Multiplanar multisequence MR imaging of the abdomen  was performed both before and after the administration of intravenous contrast. CONTRAST:  11mL MULTIHANCE GADOBENATE DIMEGLUMINE 529 MG/ML IV SOLN COMPARISON:  None. FINDINGS: Lower chest: No acute findings. Hepatobiliary: The liver is enlarged containing multiple ill defined heterogeneous  and peripheral enhancing lesions worrisome for multifocal metastatic disease. The largest lesion is in the medial segment of left lobe measuring 6 cm, image number 26 of series 6. Lesion within segment 8 measures 3.2 cm, image number 8 of series 6. Segment 5 lesion measures 3.1 cm, image 31 of series 6. The gallbladder fundus extends into the large mass within the medial segment of left lobe, image number 65 of series 1401. No biliary dilatation. Pancreas:  No inflammation or mass. Spleen:  Within normal limits in size and appearance. Adrenals/Urinary Tract: No masses identified. No evidence of hydronephrosis. Stomach/Bowel: The stomach is normal. The small bowel loops have a normal course and caliber. Visualized portions of the colon are unremarkable. Vascular/Lymphatic: Normal appearance of the abdominal aorta. Aortic atherosclerosis noted. Periaortic node is prominent measuring 8 mm, image number 50 of series 1401. Necrotic appearing portacaval node measures 1.5 cm, image 55 of series 1405. Porta hepatis node measures 1.3 cm, image 49 of series 1404 and has mass effect upon the extrahepatic portal vein. Other:  None. Musculoskeletal: No suspicious bone lesions identified. IMPRESSION: 1. Examination is positive for multifocal heterogeneous enhancing liver lesions worrisome for diffuse hepatic metastasis. Correlation with tissue sampling is advised. The dominant mass appears contiguous with the gallbladder fundus. Cannot rule out gallbladder carcinoma or other GI primary. 2. Enlarged portacaval and porta hepatic lymph nodes worrisome for metastatic adenopathy. Electronically Signed   By: Kerby Moors M.D.   On: 01/15/2017  16:09   Ir Angiogram Visceral Selective  Result Date: 01/19/2017 INDICATION: Acute abdominal pain with acute perihepatic hemorrhage 2 hours status post percutaneous left hepatic metastasis biopsy. EXAM: SELECTIVE VISCERAL ARTERIOGRAPHY; IR EMBO ART VEN HEMORR LYMPH EXTRAV INC GUIDE ROADMAPPING; ADDITIONAL ARTERIOGRAPHY; IR ULTRASOUND GUIDANCE VASC ACCESS RIGHT MEDICATIONS: 1% lidocaine locally. ANESTHESIA/SEDATION: Moderate (conscious) sedation was employed during this procedure. A total of Versed 1.0 mg and Fentanyl 25 mcg was administered intravenously. Moderate Sedation Time: 50 minutes. The patient's level of consciousness and vital signs were monitored continuously by radiology nursing throughout the procedure under my direct supervision. CONTRAST:  1 ISOVUE-300 IOPAMIDOL (ISOVUE-300) INJECTION 61%, 1 ISOVUE-300 IOPAMIDOL (ISOVUE-300) INJECTION 61% FLUOROSCOPY TIME:  Fluoroscopy Time: 14 minutes 12 seconds (144 mGy). COMPLICATIONS: None immediate. PROCEDURE: Informed consent was obtained from the patient following explanation of the procedure, risks, benefits and alternatives. The patient understands, agrees and consents for the procedure. All questions were addressed. A time out was performed prior to the initiation of the procedure. Maximal barrier sterile technique utilized including caps, mask, sterile gowns, sterile gloves, large sterile drape, hand hygiene, and Betadine prep. Under sterile conditions and local anesthesia, ultrasound micropuncture access performed of the right common femoral artery. Five French sheath inserted. SOS catheter was utilized to select the celiac origin. Celiac angiogram performed. Celiac: Celiac origin is widely patent. Splenic, common hepatic, gastroduodenal artery, proper hepatic, right and left hepatic arteries are all patent. Irregularity of the hepatic vasculature is secondary to hepatomegaly and diffuse hepatic metastases. No active bleeding demonstrated. On the  venous phase, the splenic vein and portal vein are all patent. Through the [place] a 5 French catheter, a Renegade STC microcatheter was advanced over a micro glidewire into the proper hepatic artery. Proper hepatic angiogram: Proper hepatic, right and left hepatic arteries are patent. Vasospasm present in the proximal right hepatic artery related to wire manipulation. Limited assessment of the vasculature because of the contrast injection and poor opacification of the left hepatic artery. Over a double angled Glidewire, the microcatheter  was advanced into the left hepatic artery. Left hepatic angiogram performed. Left hepatic: Left hepatic artery is patent. This is the dominant supply to the left hepatic lobe lateral segment were the percutaneous biopsy was performed. Peripheral irregularity of the arterial vasculature related to hepatic metastases. No active extravasation, acute bleeding, or other acute vascular abnormality including AV fistula or pseudoaneurysm. Because of the significant recent acute perihepatic hemorrhage and a biopsy performed in the left hepatic lobe lateral segment, Gel-Foam embolization of left hepatic artery will be performed. Left hepatic artery Gel-Foam embolization: Through the microcatheter, approximately 5 cc of Gel-Foam slurry was instilled into left hepatic artery. Following embolization, there is significant reduction in the left hepatic arterial peripheral vasculature and sluggish flow but the left hepatic main artery remains patent. Catheter was retracted and advanced into a left hepatic branch supplying the medial segment. Additional left hepatic angiogram performed. Additional left hepatic angiogram: Irregularity of the parenchymal staining related to diffuse hepatic metastases. Scattered peripheral occlusions present in the additional left hepatic branch related to the Gel-Foam embolization. Vasospasm noted at the catheter site. No active bleeding demonstrated. Microcatheter was  removed. A final angiogram was performed through the SOS 5 French catheter of the celiac origin. Celiac: Following left hepatic Gel-Foam embolization, there is significant reduction in the arterial vascularity to the the left hepatic lobe where the biopsy was performed. Residual vasospasm present in the proximal left hepatic artery. No other significant acute vascular process. Catheter was retracted and advanced into the SMA. SMA angiogram performed. SMA angiogram: SMA is widely patent. No variant SMA supply demonstrated to the liver. On the venous phase, the superior mesenteric vein and portal vein all remain patent. Access removed. Hemostasis obtained with manual compression of the right common femoral artery puncture site. Patient tolerated the procedure well. No immediate complication. IMPRESSION: Successful celiac, hepatic, and SMA angiograms as above. Successful micro catheterization, angiograms, and Gel-Foam embolization of the left hepatic artery resulting in significant reduction in arterial flow to the area the liver were the percutaneous biopsy was performed. Electronically Signed   By: Jerilynn Mages.  Shick M.D.   On: 01/19/2017 10:01   Ir Angiogram Visceral Selective  Result Date: 01/19/2017 INDICATION: Acute abdominal pain with acute perihepatic hemorrhage 2 hours status post percutaneous left hepatic metastasis biopsy. EXAM: SELECTIVE VISCERAL ARTERIOGRAPHY; IR EMBO ART VEN HEMORR LYMPH EXTRAV INC GUIDE ROADMAPPING; ADDITIONAL ARTERIOGRAPHY; IR ULTRASOUND GUIDANCE VASC ACCESS RIGHT MEDICATIONS: 1% lidocaine locally. ANESTHESIA/SEDATION: Moderate (conscious) sedation was employed during this procedure. A total of Versed 1.0 mg and Fentanyl 25 mcg was administered intravenously. Moderate Sedation Time: 50 minutes. The patient's level of consciousness and vital signs were monitored continuously by radiology nursing throughout the procedure under my direct supervision. CONTRAST:  1 ISOVUE-300 IOPAMIDOL  (ISOVUE-300) INJECTION 61%, 1 ISOVUE-300 IOPAMIDOL (ISOVUE-300) INJECTION 61% FLUOROSCOPY TIME:  Fluoroscopy Time: 14 minutes 12 seconds (144 mGy). COMPLICATIONS: None immediate. PROCEDURE: Informed consent was obtained from the patient following explanation of the procedure, risks, benefits and alternatives. The patient understands, agrees and consents for the procedure. All questions were addressed. A time out was performed prior to the initiation of the procedure. Maximal barrier sterile technique utilized including caps, mask, sterile gowns, sterile gloves, large sterile drape, hand hygiene, and Betadine prep. Under sterile conditions and local anesthesia, ultrasound micropuncture access performed of the right common femoral artery. Five French sheath inserted. SOS catheter was utilized to select the celiac origin. Celiac angiogram performed. Celiac: Celiac origin is widely patent. Splenic, common hepatic, gastroduodenal artery,  proper hepatic, right and left hepatic arteries are all patent. Irregularity of the hepatic vasculature is secondary to hepatomegaly and diffuse hepatic metastases. No active bleeding demonstrated. On the venous phase, the splenic vein and portal vein are all patent. Through the [place] a 5 French catheter, a Renegade STC microcatheter was advanced over a micro glidewire into the proper hepatic artery. Proper hepatic angiogram: Proper hepatic, right and left hepatic arteries are patent. Vasospasm present in the proximal right hepatic artery related to wire manipulation. Limited assessment of the vasculature because of the contrast injection and poor opacification of the left hepatic artery. Over a double angled Glidewire, the microcatheter was advanced into the left hepatic artery. Left hepatic angiogram performed. Left hepatic: Left hepatic artery is patent. This is the dominant supply to the left hepatic lobe lateral segment were the percutaneous biopsy was performed. Peripheral  irregularity of the arterial vasculature related to hepatic metastases. No active extravasation, acute bleeding, or other acute vascular abnormality including AV fistula or pseudoaneurysm. Because of the significant recent acute perihepatic hemorrhage and a biopsy performed in the left hepatic lobe lateral segment, Gel-Foam embolization of left hepatic artery will be performed. Left hepatic artery Gel-Foam embolization: Through the microcatheter, approximately 5 cc of Gel-Foam slurry was instilled into left hepatic artery. Following embolization, there is significant reduction in the left hepatic arterial peripheral vasculature and sluggish flow but the left hepatic main artery remains patent. Catheter was retracted and advanced into a left hepatic branch supplying the medial segment. Additional left hepatic angiogram performed. Additional left hepatic angiogram: Irregularity of the parenchymal staining related to diffuse hepatic metastases. Scattered peripheral occlusions present in the additional left hepatic branch related to the Gel-Foam embolization. Vasospasm noted at the catheter site. No active bleeding demonstrated. Microcatheter was removed. A final angiogram was performed through the SOS 5 French catheter of the celiac origin. Celiac: Following left hepatic Gel-Foam embolization, there is significant reduction in the arterial vascularity to the the left hepatic lobe where the biopsy was performed. Residual vasospasm present in the proximal left hepatic artery. No other significant acute vascular process. Catheter was retracted and advanced into the SMA. SMA angiogram performed. SMA angiogram: SMA is widely patent. No variant SMA supply demonstrated to the liver. On the venous phase, the superior mesenteric vein and portal vein all remain patent. Access removed. Hemostasis obtained with manual compression of the right common femoral artery puncture site. Patient tolerated the procedure well. No immediate  complication. IMPRESSION: Successful celiac, hepatic, and SMA angiograms as above. Successful micro catheterization, angiograms, and Gel-Foam embolization of the left hepatic artery resulting in significant reduction in arterial flow to the area the liver were the percutaneous biopsy was performed. Electronically Signed   By: Jerilynn Mages.  Shick M.D.   On: 01/19/2017 10:01   Ir Angiogram Selective Each Additional Vessel  Result Date: 01/19/2017 INDICATION: Acute abdominal pain with acute perihepatic hemorrhage 2 hours status post percutaneous left hepatic metastasis biopsy. EXAM: SELECTIVE VISCERAL ARTERIOGRAPHY; IR EMBO ART VEN HEMORR LYMPH EXTRAV INC GUIDE ROADMAPPING; ADDITIONAL ARTERIOGRAPHY; IR ULTRASOUND GUIDANCE VASC ACCESS RIGHT MEDICATIONS: 1% lidocaine locally. ANESTHESIA/SEDATION: Moderate (conscious) sedation was employed during this procedure. A total of Versed 1.0 mg and Fentanyl 25 mcg was administered intravenously. Moderate Sedation Time: 50 minutes. The patient's level of consciousness and vital signs were monitored continuously by radiology nursing throughout the procedure under my direct supervision. CONTRAST:  1 ISOVUE-300 IOPAMIDOL (ISOVUE-300) INJECTION 61%, 1 ISOVUE-300 IOPAMIDOL (ISOVUE-300) INJECTION 61% FLUOROSCOPY TIME:  Fluoroscopy Time: 14  minutes 12 seconds (144 mGy). COMPLICATIONS: None immediate. PROCEDURE: Informed consent was obtained from the patient following explanation of the procedure, risks, benefits and alternatives. The patient understands, agrees and consents for the procedure. All questions were addressed. A time out was performed prior to the initiation of the procedure. Maximal barrier sterile technique utilized including caps, mask, sterile gowns, sterile gloves, large sterile drape, hand hygiene, and Betadine prep. Under sterile conditions and local anesthesia, ultrasound micropuncture access performed of the right common femoral artery. Five French sheath inserted. SOS  catheter was utilized to select the celiac origin. Celiac angiogram performed. Celiac: Celiac origin is widely patent. Splenic, common hepatic, gastroduodenal artery, proper hepatic, right and left hepatic arteries are all patent. Irregularity of the hepatic vasculature is secondary to hepatomegaly and diffuse hepatic metastases. No active bleeding demonstrated. On the venous phase, the splenic vein and portal vein are all patent. Through the [place] a 5 French catheter, a Renegade STC microcatheter was advanced over a micro glidewire into the proper hepatic artery. Proper hepatic angiogram: Proper hepatic, right and left hepatic arteries are patent. Vasospasm present in the proximal right hepatic artery related to wire manipulation. Limited assessment of the vasculature because of the contrast injection and poor opacification of the left hepatic artery. Over a double angled Glidewire, the microcatheter was advanced into the left hepatic artery. Left hepatic angiogram performed. Left hepatic: Left hepatic artery is patent. This is the dominant supply to the left hepatic lobe lateral segment were the percutaneous biopsy was performed. Peripheral irregularity of the arterial vasculature related to hepatic metastases. No active extravasation, acute bleeding, or other acute vascular abnormality including AV fistula or pseudoaneurysm. Because of the significant recent acute perihepatic hemorrhage and a biopsy performed in the left hepatic lobe lateral segment, Gel-Foam embolization of left hepatic artery will be performed. Left hepatic artery Gel-Foam embolization: Through the microcatheter, approximately 5 cc of Gel-Foam slurry was instilled into left hepatic artery. Following embolization, there is significant reduction in the left hepatic arterial peripheral vasculature and sluggish flow but the left hepatic main artery remains patent. Catheter was retracted and advanced into a left hepatic branch supplying the medial  segment. Additional left hepatic angiogram performed. Additional left hepatic angiogram: Irregularity of the parenchymal staining related to diffuse hepatic metastases. Scattered peripheral occlusions present in the additional left hepatic branch related to the Gel-Foam embolization. Vasospasm noted at the catheter site. No active bleeding demonstrated. Microcatheter was removed. A final angiogram was performed through the SOS 5 French catheter of the celiac origin. Celiac: Following left hepatic Gel-Foam embolization, there is significant reduction in the arterial vascularity to the the left hepatic lobe where the biopsy was performed. Residual vasospasm present in the proximal left hepatic artery. No other significant acute vascular process. Catheter was retracted and advanced into the SMA. SMA angiogram performed. SMA angiogram: SMA is widely patent. No variant SMA supply demonstrated to the liver. On the venous phase, the superior mesenteric vein and portal vein all remain patent. Access removed. Hemostasis obtained with manual compression of the right common femoral artery puncture site. Patient tolerated the procedure well. No immediate complication. IMPRESSION: Successful celiac, hepatic, and SMA angiograms as above. Successful micro catheterization, angiograms, and Gel-Foam embolization of the left hepatic artery resulting in significant reduction in arterial flow to the area the liver were the percutaneous biopsy was performed. Electronically Signed   By: Jerilynn Mages.  Shick M.D.   On: 01/19/2017 10:01   Ir Angiogram Selective Each Additional Vessel  Result  Date: 01/19/2017 INDICATION: Acute abdominal pain with acute perihepatic hemorrhage 2 hours status post percutaneous left hepatic metastasis biopsy. EXAM: SELECTIVE VISCERAL ARTERIOGRAPHY; IR EMBO ART VEN HEMORR LYMPH EXTRAV INC GUIDE ROADMAPPING; ADDITIONAL ARTERIOGRAPHY; IR ULTRASOUND GUIDANCE VASC ACCESS RIGHT MEDICATIONS: 1% lidocaine locally.  ANESTHESIA/SEDATION: Moderate (conscious) sedation was employed during this procedure. A total of Versed 1.0 mg and Fentanyl 25 mcg was administered intravenously. Moderate Sedation Time: 50 minutes. The patient's level of consciousness and vital signs were monitored continuously by radiology nursing throughout the procedure under my direct supervision. CONTRAST:  1 ISOVUE-300 IOPAMIDOL (ISOVUE-300) INJECTION 61%, 1 ISOVUE-300 IOPAMIDOL (ISOVUE-300) INJECTION 61% FLUOROSCOPY TIME:  Fluoroscopy Time: 14 minutes 12 seconds (144 mGy). COMPLICATIONS: None immediate. PROCEDURE: Informed consent was obtained from the patient following explanation of the procedure, risks, benefits and alternatives. The patient understands, agrees and consents for the procedure. All questions were addressed. A time out was performed prior to the initiation of the procedure. Maximal barrier sterile technique utilized including caps, mask, sterile gowns, sterile gloves, large sterile drape, hand hygiene, and Betadine prep. Under sterile conditions and local anesthesia, ultrasound micropuncture access performed of the right common femoral artery. Five French sheath inserted. SOS catheter was utilized to select the celiac origin. Celiac angiogram performed. Celiac: Celiac origin is widely patent. Splenic, common hepatic, gastroduodenal artery, proper hepatic, right and left hepatic arteries are all patent. Irregularity of the hepatic vasculature is secondary to hepatomegaly and diffuse hepatic metastases. No active bleeding demonstrated. On the venous phase, the splenic vein and portal vein are all patent. Through the [place] a 5 French catheter, a Renegade STC microcatheter was advanced over a micro glidewire into the proper hepatic artery. Proper hepatic angiogram: Proper hepatic, right and left hepatic arteries are patent. Vasospasm present in the proximal right hepatic artery related to wire manipulation. Limited assessment of the vasculature  because of the contrast injection and poor opacification of the left hepatic artery. Over a double angled Glidewire, the microcatheter was advanced into the left hepatic artery. Left hepatic angiogram performed. Left hepatic: Left hepatic artery is patent. This is the dominant supply to the left hepatic lobe lateral segment were the percutaneous biopsy was performed. Peripheral irregularity of the arterial vasculature related to hepatic metastases. No active extravasation, acute bleeding, or other acute vascular abnormality including AV fistula or pseudoaneurysm. Because of the significant recent acute perihepatic hemorrhage and a biopsy performed in the left hepatic lobe lateral segment, Gel-Foam embolization of left hepatic artery will be performed. Left hepatic artery Gel-Foam embolization: Through the microcatheter, approximately 5 cc of Gel-Foam slurry was instilled into left hepatic artery. Following embolization, there is significant reduction in the left hepatic arterial peripheral vasculature and sluggish flow but the left hepatic main artery remains patent. Catheter was retracted and advanced into a left hepatic branch supplying the medial segment. Additional left hepatic angiogram performed. Additional left hepatic angiogram: Irregularity of the parenchymal staining related to diffuse hepatic metastases. Scattered peripheral occlusions present in the additional left hepatic branch related to the Gel-Foam embolization. Vasospasm noted at the catheter site. No active bleeding demonstrated. Microcatheter was removed. A final angiogram was performed through the SOS 5 French catheter of the celiac origin. Celiac: Following left hepatic Gel-Foam embolization, there is significant reduction in the arterial vascularity to the the left hepatic lobe where the biopsy was performed. Residual vasospasm present in the proximal left hepatic artery. No other significant acute vascular process. Catheter was retracted and  advanced into the SMA. SMA angiogram performed.  SMA angiogram: SMA is widely patent. No variant SMA supply demonstrated to the liver. On the venous phase, the superior mesenteric vein and portal vein all remain patent. Access removed. Hemostasis obtained with manual compression of the right common femoral artery puncture site. Patient tolerated the procedure well. No immediate complication. IMPRESSION: Successful celiac, hepatic, and SMA angiograms as above. Successful micro catheterization, angiograms, and Gel-Foam embolization of the left hepatic artery resulting in significant reduction in arterial flow to the area the liver were the percutaneous biopsy was performed. Electronically Signed   By: Jerilynn Mages.  Shick M.D.   On: 01/19/2017 10:01   Ir Angiogram Selective Each Additional Vessel  Result Date: 01/19/2017 INDICATION: Acute abdominal pain with acute perihepatic hemorrhage 2 hours status post percutaneous left hepatic metastasis biopsy. EXAM: SELECTIVE VISCERAL ARTERIOGRAPHY; IR EMBO ART VEN HEMORR LYMPH EXTRAV INC GUIDE ROADMAPPING; ADDITIONAL ARTERIOGRAPHY; IR ULTRASOUND GUIDANCE VASC ACCESS RIGHT MEDICATIONS: 1% lidocaine locally. ANESTHESIA/SEDATION: Moderate (conscious) sedation was employed during this procedure. A total of Versed 1.0 mg and Fentanyl 25 mcg was administered intravenously. Moderate Sedation Time: 50 minutes. The patient's level of consciousness and vital signs were monitored continuously by radiology nursing throughout the procedure under my direct supervision. CONTRAST:  1 ISOVUE-300 IOPAMIDOL (ISOVUE-300) INJECTION 61%, 1 ISOVUE-300 IOPAMIDOL (ISOVUE-300) INJECTION 61% FLUOROSCOPY TIME:  Fluoroscopy Time: 14 minutes 12 seconds (144 mGy). COMPLICATIONS: None immediate. PROCEDURE: Informed consent was obtained from the patient following explanation of the procedure, risks, benefits and alternatives. The patient understands, agrees and consents for the procedure. All questions were  addressed. A time out was performed prior to the initiation of the procedure. Maximal barrier sterile technique utilized including caps, mask, sterile gowns, sterile gloves, large sterile drape, hand hygiene, and Betadine prep. Under sterile conditions and local anesthesia, ultrasound micropuncture access performed of the right common femoral artery. Five French sheath inserted. SOS catheter was utilized to select the celiac origin. Celiac angiogram performed. Celiac: Celiac origin is widely patent. Splenic, common hepatic, gastroduodenal artery, proper hepatic, right and left hepatic arteries are all patent. Irregularity of the hepatic vasculature is secondary to hepatomegaly and diffuse hepatic metastases. No active bleeding demonstrated. On the venous phase, the splenic vein and portal vein are all patent. Through the [place] a 5 French catheter, a Renegade STC microcatheter was advanced over a micro glidewire into the proper hepatic artery. Proper hepatic angiogram: Proper hepatic, right and left hepatic arteries are patent. Vasospasm present in the proximal right hepatic artery related to wire manipulation. Limited assessment of the vasculature because of the contrast injection and poor opacification of the left hepatic artery. Over a double angled Glidewire, the microcatheter was advanced into the left hepatic artery. Left hepatic angiogram performed. Left hepatic: Left hepatic artery is patent. This is the dominant supply to the left hepatic lobe lateral segment were the percutaneous biopsy was performed. Peripheral irregularity of the arterial vasculature related to hepatic metastases. No active extravasation, acute bleeding, or other acute vascular abnormality including AV fistula or pseudoaneurysm. Because of the significant recent acute perihepatic hemorrhage and a biopsy performed in the left hepatic lobe lateral segment, Gel-Foam embolization of left hepatic artery will be performed. Left hepatic artery  Gel-Foam embolization: Through the microcatheter, approximately 5 cc of Gel-Foam slurry was instilled into left hepatic artery. Following embolization, there is significant reduction in the left hepatic arterial peripheral vasculature and sluggish flow but the left hepatic main artery remains patent. Catheter was retracted and advanced into a left hepatic branch supplying the medial segment.  Additional left hepatic angiogram performed. Additional left hepatic angiogram: Irregularity of the parenchymal staining related to diffuse hepatic metastases. Scattered peripheral occlusions present in the additional left hepatic branch related to the Gel-Foam embolization. Vasospasm noted at the catheter site. No active bleeding demonstrated. Microcatheter was removed. A final angiogram was performed through the SOS 5 French catheter of the celiac origin. Celiac: Following left hepatic Gel-Foam embolization, there is significant reduction in the arterial vascularity to the the left hepatic lobe where the biopsy was performed. Residual vasospasm present in the proximal left hepatic artery. No other significant acute vascular process. Catheter was retracted and advanced into the SMA. SMA angiogram performed. SMA angiogram: SMA is widely patent. No variant SMA supply demonstrated to the liver. On the venous phase, the superior mesenteric vein and portal vein all remain patent. Access removed. Hemostasis obtained with manual compression of the right common femoral artery puncture site. Patient tolerated the procedure well. No immediate complication. IMPRESSION: Successful celiac, hepatic, and SMA angiograms as above. Successful micro catheterization, angiograms, and Gel-Foam embolization of the left hepatic artery resulting in significant reduction in arterial flow to the area the liver were the percutaneous biopsy was performed. Electronically Signed   By: Jerilynn Mages.  Shick M.D.   On: 01/19/2017 10:01   US Biopsy  Result Date:  01/18/2017 INDICATION: LIVER METASTASES, UNKNOWN PRIMARY. EXAM: ULTRASOUND BIOPSY CORE LIVER MEDICATIONS: 1% LIDOCAINE ANESTHESIA/SEDATION: Moderate (conscious) sedation was employed during this procedure. A total of Versed 1.0 mg and Fentanyl 50 mcg was administered intravenously. Moderate Sedation Time: 10 minutes. The patient's level of consciousness and vital signs were monitored continuously by radiology nursing throughout the procedure under my direct supervision. FLUOROSCOPY TIME:  Fluoroscopy Time: NONE. COMPLICATIONS: None immediate. PROCEDURE: Informed written consent was obtained from the patient after a thorough discussion of the procedural risks, benefits and alternatives. All questions were addressed. Maximal Sterile Barrier Technique was utilized including caps, mask, sterile gowns, sterile gloves, sterile drape, hand hygiene and skin antiseptic. A timeout was performed prior to the initiation of the procedure. Previous imaging reviewed. Preliminary ultrasound performed. A solid left hepatic lobe lesion was localized from a subxiphoid window. This was correlated with the abdominal MRI. Overlying skin was marked. Under sterile conditions and local anesthesia, a 17 gauge 6.8 cm access needle was advanced from an anterior subxiphoid approach into the left hepatic mass. Needle position confirmed with ultrasound. Three 1 cm 18 gauge core biopsies obtained. Samples placed in formalin. These were intact and non fragmented. Needle tract embolized with Gel-Foam. Postprocedure imaging demonstrates no hemorrhage or hematoma. Patient tolerated the biopsy well. IMPRESSION: Successful ultrasound left hepatic mass 18 gauge core biopsy Electronically Signed   By: Jerilynn Mages.  Shick M.D.   On: 01/18/2017 14:53   Ir US Guide Vasc Access Right  Result Date: 01/19/2017 INDICATION: Acute abdominal pain with acute perihepatic hemorrhage 2 hours status post percutaneous left hepatic metastasis biopsy. EXAM: SELECTIVE VISCERAL  ARTERIOGRAPHY; IR EMBO ART VEN HEMORR LYMPH EXTRAV INC GUIDE ROADMAPPING; ADDITIONAL ARTERIOGRAPHY; IR ULTRASOUND GUIDANCE VASC ACCESS RIGHT MEDICATIONS: 1% lidocaine locally. ANESTHESIA/SEDATION: Moderate (conscious) sedation was employed during this procedure. A total of Versed 1.0 mg and Fentanyl 25 mcg was administered intravenously. Moderate Sedation Time: 50 minutes. The patient's level of consciousness and vital signs were monitored continuously by radiology nursing throughout the procedure under my direct supervision. CONTRAST:  1 ISOVUE-300 IOPAMIDOL (ISOVUE-300) INJECTION 61%, 1 ISOVUE-300 IOPAMIDOL (ISOVUE-300) INJECTION 61% FLUOROSCOPY TIME:  Fluoroscopy Time: 14 minutes 12 seconds (144 mGy). COMPLICATIONS: None  immediate. PROCEDURE: Informed consent was obtained from the patient following explanation of the procedure, risks, benefits and alternatives. The patient understands, agrees and consents for the procedure. All questions were addressed. A time out was performed prior to the initiation of the procedure. Maximal barrier sterile technique utilized including caps, mask, sterile gowns, sterile gloves, large sterile drape, hand hygiene, and Betadine prep. Under sterile conditions and local anesthesia, ultrasound micropuncture access performed of the right common femoral artery. Five French sheath inserted. SOS catheter was utilized to select the celiac origin. Celiac angiogram performed. Celiac: Celiac origin is widely patent. Splenic, common hepatic, gastroduodenal artery, proper hepatic, right and left hepatic arteries are all patent. Irregularity of the hepatic vasculature is secondary to hepatomegaly and diffuse hepatic metastases. No active bleeding demonstrated. On the venous phase, the splenic vein and portal vein are all patent. Through the [place] a 5 French catheter, a Renegade STC microcatheter was advanced over a micro glidewire into the proper hepatic artery. Proper hepatic angiogram: Proper  hepatic, right and left hepatic arteries are patent. Vasospasm present in the proximal right hepatic artery related to wire manipulation. Limited assessment of the vasculature because of the contrast injection and poor opacification of the left hepatic artery. Over a double angled Glidewire, the microcatheter was advanced into the left hepatic artery. Left hepatic angiogram performed. Left hepatic: Left hepatic artery is patent. This is the dominant supply to the left hepatic lobe lateral segment were the percutaneous biopsy was performed. Peripheral irregularity of the arterial vasculature related to hepatic metastases. No active extravasation, acute bleeding, or other acute vascular abnormality including AV fistula or pseudoaneurysm. Because of the significant recent acute perihepatic hemorrhage and a biopsy performed in the left hepatic lobe lateral segment, Gel-Foam embolization of left hepatic artery will be performed. Left hepatic artery Gel-Foam embolization: Through the microcatheter, approximately 5 cc of Gel-Foam slurry was instilled into left hepatic artery. Following embolization, there is significant reduction in the left hepatic arterial peripheral vasculature and sluggish flow but the left hepatic main artery remains patent. Catheter was retracted and advanced into a left hepatic branch supplying the medial segment. Additional left hepatic angiogram performed. Additional left hepatic angiogram: Irregularity of the parenchymal staining related to diffuse hepatic metastases. Scattered peripheral occlusions present in the additional left hepatic branch related to the Gel-Foam embolization. Vasospasm noted at the catheter site. No active bleeding demonstrated. Microcatheter was removed. A final angiogram was performed through the SOS 5 French catheter of the celiac origin. Celiac: Following left hepatic Gel-Foam embolization, there is significant reduction in the arterial vascularity to the the left  hepatic lobe where the biopsy was performed. Residual vasospasm present in the proximal left hepatic artery. No other significant acute vascular process. Catheter was retracted and advanced into the SMA. SMA angiogram performed. SMA angiogram: SMA is widely patent. No variant SMA supply demonstrated to the liver. On the venous phase, the superior mesenteric vein and portal vein all remain patent. Access removed. Hemostasis obtained with manual compression of the right common femoral artery puncture site. Patient tolerated the procedure well. No immediate complication. IMPRESSION: Successful celiac, hepatic, and SMA angiograms as above. Successful micro catheterization, angiograms, and Gel-Foam embolization of the left hepatic artery resulting in significant reduction in arterial flow to the area the liver were the percutaneous biopsy was performed. Electronically Signed   By: Jerilynn Mages.  Shick M.D.   On: 01/19/2017 10:01   Dg Chest Port 1v Same Day  Result Date: 01/18/2017 CLINICAL DATA:  Acute onset  of shortness of breath, vomiting and epigastric abdominal pain. Fever. Initial encounter. EXAM: PORTABLE CHEST 1 VIEW COMPARISON:  Chest radiograph performed 01/16/2017 FINDINGS: The lungs are hyperexpanded, with flattening of the hemidiaphragms, compatible with COPD. There is no evidence of focal opacification, pleural effusion or pneumothorax. The cardiomediastinal silhouette is within normal limits. No acute osseous abnormalities are seen. IMPRESSION: Findings of COPD.  Lungs remain otherwise clear. Electronically Signed   By: Garald Balding M.D.   On: 01/18/2017 23:48   Dg Abd Portable 1v  Result Date: 01/18/2017 CLINICAL DATA:  Acute onset of shortness of breath, vomiting and epigastric abdominal pain. Recent liver biopsy. Fever. Initial encounter. EXAM: PORTABLE ABDOMEN - 1 VIEW COMPARISON:  None. FINDINGS: The visualized bowel gas pattern is unremarkable. Scattered air and stool filled loops of colon are seen; no  abnormal dilatation of small bowel loops is seen to suggest small bowel obstruction. No free intra-abdominal air is identified, though evaluation for free air is limited on a single supine view. The stomach is largely filled with air. The visualized osseous structures are within normal limits; the sacroiliac joints are unremarkable in appearance. IMPRESSION: Unremarkable bowel gas pattern; no free intra-abdominal air seen. Moderate amount of stool noted in the colon. Electronically Signed   By: Garald Balding M.D.   On: 01/18/2017 23:50   [place] Guide Roadmapping  Result Date: 01/19/2017 INDICATION: Acute abdominal pain with acute perihepatic hemorrhage 2 hours status post percutaneous left hepatic metastasis biopsy. EXAM: SELECTIVE VISCERAL ARTERIOGRAPHY; IR EMBO ART VEN HEMORR LYMPH EXTRAV INC GUIDE ROADMAPPING; ADDITIONAL ARTERIOGRAPHY; IR ULTRASOUND GUIDANCE VASC ACCESS RIGHT MEDICATIONS: 1% lidocaine locally. ANESTHESIA/SEDATION: Moderate (conscious) sedation was employed during this procedure. A total of Versed 1.0 mg and Fentanyl 25 mcg was administered intravenously. Moderate Sedation Time: 50 minutes. The patient's level of consciousness and vital signs were monitored continuously by radiology nursing throughout the procedure under my direct supervision. CONTRAST:  1 ISOVUE-300 IOPAMIDOL (ISOVUE-300) INJECTION 61%, 1 ISOVUE-300 IOPAMIDOL (ISOVUE-300) INJECTION 61% FLUOROSCOPY TIME:  Fluoroscopy Time: 14 minutes 12 seconds (144 mGy). COMPLICATIONS: None immediate. PROCEDURE: Informed consent was obtained from the patient following explanation of the procedure, risks, benefits and alternatives. The patient understands, agrees and consents for the procedure. All questions were addressed. A time out was performed prior to the initiation of the procedure. Maximal barrier sterile technique utilized including caps, mask, sterile gowns, sterile gloves, large sterile drape,  hand hygiene, and Betadine prep. Under sterile conditions and local anesthesia, ultrasound micropuncture access performed of the right common femoral artery. Five French sheath inserted. SOS catheter was utilized to select the celiac origin. Celiac angiogram performed. Celiac: Celiac origin is widely patent. Splenic, common hepatic, gastroduodenal artery, proper hepatic, right and left hepatic arteries are all patent. Irregularity of the hepatic vasculature is secondary to hepatomegaly and diffuse hepatic metastases. No active bleeding demonstrated. On the venous phase, the splenic vein and portal vein are all patent. Through the Sisters a 5 French catheter, a Renegade STC microcatheter was advanced over a micro glidewire into the proper hepatic artery. Proper hepatic angiogram: Proper hepatic, right and left hepatic arteries are patent. Vasospasm present in the proximal right hepatic artery related to wire manipulation. Limited assessment of the vasculature because of the contrast injection and poor opacification of the left hepatic artery. Over a double angled Glidewire, the microcatheter was advanced into the left hepatic artery. Left hepatic angiogram performed. Left hepatic: Left hepatic artery is patent. This  is the dominant supply to the left hepatic lobe lateral segment were the percutaneous biopsy was performed. Peripheral irregularity of the arterial vasculature related to hepatic metastases. No active extravasation, acute bleeding, or other acute vascular abnormality including AV fistula or pseudoaneurysm. Because of the significant recent acute perihepatic hemorrhage and a biopsy performed in the left hepatic lobe lateral segment, Gel-Foam embolization of left hepatic artery will be performed. Left hepatic artery Gel-Foam embolization: Through the microcatheter, approximately 5 cc of Gel-Foam slurry was instilled into left hepatic artery. Following embolization, there is significant reduction in the left  hepatic arterial peripheral vasculature and sluggish flow but the left hepatic main artery remains patent. Catheter was retracted and advanced into a left hepatic branch supplying the medial segment. Additional left hepatic angiogram performed. Additional left hepatic angiogram: Irregularity of the parenchymal staining related to diffuse hepatic metastases. Scattered peripheral occlusions present in the additional left hepatic branch related to the Gel-Foam embolization. Vasospasm noted at the catheter site. No active bleeding demonstrated. Microcatheter was removed. A final angiogram was performed through the SOS 5 French catheter of the celiac origin. Celiac: Following left hepatic Gel-Foam embolization, there is significant reduction in the arterial vascularity to the the left hepatic lobe where the biopsy was performed. Residual vasospasm present in the proximal left hepatic artery. No other significant acute vascular process. Catheter was retracted and advanced into the SMA. SMA angiogram performed. SMA angiogram: SMA is widely patent. No variant SMA supply demonstrated to the liver. On the venous phase, the superior mesenteric vein and portal vein all remain patent. Access removed. Hemostasis obtained with manual compression of the right common femoral artery puncture site. Patient tolerated the procedure well. No immediate complication. IMPRESSION: Successful celiac, hepatic, and SMA angiograms as above. Successful micro catheterization, angiograms, and Gel-Foam embolization of the left hepatic artery resulting in significant reduction in arterial flow to the area the liver were the percutaneous biopsy was performed. Electronically Signed   By: Jerilynn Mages.  Shick M.D.   On: 01/19/2017 10:01   Ct Angio Abd/pel W/ And/or W/o  Result Date: 01/20/2017 CLINICAL DATA:  Hepatic metastases, status post ultrasound percutaneous biopsy which was complicated by acute bleeding 2 hours after the biopsy. Patient subsequently  underwent Gel-Foam embolization of left hepatic artery. Despite transfusions, further decrease in hemoglobin this morning. EXAM: CT ANGIOGRAPHY ABDOMEN AND PELVIS TECHNIQUE: Multidetector CT imaging of the abdomen and pelvis was performed using the standard protocol during bolus administration of intravenous contrast. Multiplanar reconstructed images including MIPs were obtained and reviewed to evaluate the vascular anatomy. CONTRAST:  100 cc Isovue COMPARISON:  01/18/2017 FINDINGS: Arterial findings: Aorta: Scattered moderate atherosclerosis of the aorta, most pronounced in the infrarenal aorta without occlusion, aneurysm or dissection. No retroperitoneal hemorrhage. Celiac axis: Atherosclerotic origin but remains patent. Splenic, left gastric, and hepatic branches appear patent. No active bleeding or acute vascular process. Superior mesenteric: Atherosclerotic origin but remains patent. SMA branches appear patent. No active bleeding. Left renal:           Mild atherosclerosis but widely patent. Right renal:          Mild atherosclerosis but widely patent. Inferior mesenteric:  Atherosclerotic origin but remains patent. Left iliac: Iliac atherosclerosis noted without occlusion. Left common, internal and external iliac arteries are patent. Right iliac: Similar atherosclerosis without occlusion. Right common, internal and external iliac arteries are patent. Venous findings: Hepatic veins, portal, splenic, mesenteric veins are patent. No veno-occlusive process. IVC, renal veins common iliac veins are patent. Review  of the MIP images confirms the above findings. Nonvascular findings: Basilar emphysema noted. Minor atelectasis. Normal heart size. Trace pleural effusions. No pericardial effusion. Hypodense peripherally enhancing hepatic masses again evident compatible with metastases. No evidence of active bleeding or acute vascular process within the liver. No biliary dilatation. Gallbladder is collapsed but contains  partially calcified gallstones. Moderate Heterogeneous perihepatic free fluid, compatible with a mixture of ascites and known hemoperitoneum related to the recent left hepatic mass biopsy. Spleen is unremarkable. Small amount of hemoperitoneum about the spleen upper pole. Adrenal glands, kidneys, ureters, and bladder demonstrate no acute process, obstruction or hydronephrosis. Negative for bowel obstruction, significant dilatation, ileus, or free air. No adenopathy Remote hysterectomy.  Pelvic ascites also evident. No inguinal abnormality or hernia. Intact abdominal wall. No rectus sheath hemorrhage or hematoma over the left hepatic biopsy site. Degenerative changes of the spine. Bones are mildly osteopenic. No acute osseous finding. IMPRESSION: Negative for acute or active intra-abdominal bleeding. Stable diffuse hepatic metastatic process. Stable perihepatic ascites and hemoperitoneum related to the recent left hepatic mass biopsy. Intact abdominal wall without rectus sheath hematoma or hemorrhage. Aortoiliac atherosclerosis Cholelithiasis Pelvic ascites as well These results were called by telephone at the time of interpretation on 01/20/2017 at 2:13 pm to Dr. Florencia Reasons , who verbally acknowledged these results. Electronically Signed   By: Jerilynn Mages.  Shick M.D.   On: 01/20/2017 14:15   US Abdomen Limited Ruq  Result Date: 01/18/2017 CLINICAL DATA:  61 year old female with a history of multiple liver masses, status post percutaneous biopsy today's date. Interval development of abdominal pain EXAM: US ABDOMEN LIMITED - RIGHT UPPER QUADRANT COMPARISON:  Ultrasound 01/18/2017, MRI 01/15/2017 FINDINGS: Gallbladder: Contracted gallbladder with echogenic focus in the lumen compatible with cholelithiasis. Common bile duct: Diameter: 4 mm -5 mm Liver: Re- demonstration of mass within right liver lobe 7 point far cm by 5.4 cm x 7.4 cm. The biopsy of the left liver lobe on the comparison ultrasound today's date demonstrates small  echogenic focus, compatible with hemostatic material. There is hyperechoic free fluid adjacent to the liver margin, both anterior and posterior to the left liver lobe. IMPRESSION: New hyperechoic free fluid adjacent to the left liver lobe, status post percutaneous biopsy, compatible with hemorrhage. Re- demonstration of liver masses, better characterized on prior MRI. These results were called by telephone at the time of interpretation on 01/18/2017 at 5:42 pm to Dr. Annamaria Boots who verbally acknowledged these results. Electronically Signed   By: Corrie Mckusick D.O.   On: 01/18/2017 17:43   US Abdomen Limited Ruq  Result Date: 01/11/2017 CLINICAL DATA:  Right upper quadrant pain . EXAM: US ABDOMEN LIMITED - RIGHT UPPER QUADRANT COMPARISON:  No recent prior . FINDINGS: Gallbladder: Multiple gallstones are noted. Gallstone stones measure up to 1.1 cm. Gallbladder wall is thickened at 1.1 cm. Cholecystitis cannot be excluded. Negative Murphy sign. Common bile duct: Diameter: 2.0 mm Liver: Multiple solid hepatic lesions are noted. Largest lesion measures 9.7 cm and is in the right hepatic lobe. These findings are highly suspicious for metastatic disease. A pancreatic head mass cannot be excluded. Appear pancreatic adenopathy cannot be excluded. Portal vein is narrowed most likely from periportal adenopathy. Portal vein however is patent. IMPRESSION: 1. Multiple gallstones. Gallbladder wall is thickened at 1.1 cm. Cholecystitis cannot be excluded. 2. Multiple solid hepatic lesions. The largest measures 9.7 cm. Pancreatic mass appears to be present. Pancreatic solid lesions noted most likely adenopathy. Portal vein is narrowed possibly from periportal adenopathy. Portal vein  however is patent. These findings together suggest malignancy with metastatic disease. Further evaluation with gadolinium-enhanced MRI of the abdomen should be considered. Electronically Signed   By: Marcello Moores  Register   On: 01/11/2017 12:19     LOS: 5  days   Oren Binet, MD  [facility] Pager:[phone]  If 7PM-7AM, please contact night-coverage [email or web address] Password [facility] 01/23/2017, 2:36 PM

## 2017-01-23 NOTE — Progress Notes (Signed)
Physical Therapy Treatment Patient Details Name: Rachel Butler MRN: [id number] DOB: [birth year] Today's Date: 01/23/2017    History of Present Illness Pt is a 61 y.o. female admitted following outpatient liver biopsy as she developed fever, tachycardia, hypotension and worsening RUQ pain. PMH includes DDD and chronic pain syndrome. MRI positive for multifocal heterogeneous enhancing liver lesions worrisome for diffuse hepatic metastasis. CT shows aortoiliac atherosclerosis, cholelithiasis, and pelvic ascites. Pt also with acute periphatic hemorrhage after L hepatic mass core biopsy with L hepatic artery gelfoam embo on 2/23; and R groin bleed s/p angio.      PT Comments    Pt increased ambulation distance today, 250' with RW and min-guard A, remained on O2 with sats 98%, HR up to 115 bpm. RW helped with stability with left foot drop. PT will continue to follow.    Follow Up Recommendations  Home health PT     Equipment Recommendations  Rolling walker with 5" wheels    Recommendations for Other Services       Precautions / Restrictions Precautions Precautions: Fall Precaution Comments: Watch BP and O2 Restrictions Weight Bearing Restrictions: No    Mobility  Bed Mobility               General bed mobility comments: Pt received seated in chair  Transfers Overall transfer level: Needs assistance Equipment used: Rolling walker (2 wheeled) Transfers: Sit to/from Stand Sit to Stand: Supervision         General transfer comment: close supervision, took increased time for pt to steady  Ambulation/Gait Ambulation/Gait assistance: Min guard Ambulation Distance (Feet): 250 Feet Assistive device: Rolling walker (2 wheeled) Gait Pattern/deviations: Step-through pattern;Decreased step length - right;Decreased step length - left;Decreased stride length Gait velocity: decreased Gait velocity interpretation: <1.8 ft/sec, indicative of risk for recurrent falls General Gait  Details: vc's for posture. O2 sats 98% on 2L O2. HR up to 115 bpm   Stairs            Wheelchair Mobility    Modified Rankin (Stroke Patients Only)       Balance Overall balance assessment: Needs assistance;History of Falls Sitting-balance support: No upper extremity supported;Feet supported Sitting balance-Leahy Scale: Good     Standing balance support: Single extremity supported;During functional activity Standing balance-Leahy Scale: Poor Standing balance comment: reliant on UE support                    Cognition Arousal/Alertness: Awake/alert Behavior During Therapy: WFL for tasks assessed/performed Overall Cognitive Status: Within Functional Limits for tasks assessed                      Exercises General Exercises - Lower Extremity Ankle Circles/Pumps: AROM;Both;15 reps;Seated Heel Slides: AROM;Both;10 reps;Seated    General Comments        Pertinent Vitals/Pain Pain Assessment: Faces Faces Pain Scale: Hurts a little bit Pain Location: neck Pain Descriptors / Indicators: Aching Pain Intervention(s): Monitored during session;Premedicated before session    Home Living                      Prior Function            PT Goals (current goals can now be found in the care plan section) Acute Rehab PT Goals Patient Stated Goal: Return home PT Goal Formulation: With patient Time For Goal Achievement: 02/04/17 Potential to Achieve Goals: Good Progress towards PT goals: Progressing toward goals    Frequency  Min 3X/week      PT Plan Current plan remains appropriate    Co-evaluation             End of Session Equipment Utilized During Treatment: Gait belt;Oxygen Activity Tolerance: Patient tolerated treatment well Patient left: in chair;with call bell/phone within reach Nurse Communication: Mobility status PT Visit Diagnosis: Unsteadiness on feet (R26.81);Muscle weakness (generalized) (M62.81)     Time:  [id number] PT Time Calculation (min) (ACUTE ONLY): 24 min  Charges:  $Gait Training: 23-37 mins                    G Codes:      Leighton Roach, [place] 01/23/2017, 4:44 PM

## 2017-01-24 ENCOUNTER — Encounter (HOSPITAL_COMMUNITY)
Admission: RE | Disposition: A | Discharge status: Home / Self Care | Payer: Self-pay | Source: Ambulatory Visit | Attending: Internal Medicine

## 2017-01-24 ENCOUNTER — Inpatient Hospital Stay (HOSPITAL_COMMUNITY): Payer: Medicare Other | Admitting: Anesthesiology

## 2017-01-24 ENCOUNTER — Telehealth: Payer: Self-pay | Admitting: Internal Medicine

## 2017-01-24 ENCOUNTER — Inpatient Hospital Stay (HOSPITAL_COMMUNITY): Payer: Medicare Other

## 2017-01-24 ENCOUNTER — Inpatient Hospital Stay: Payer: Medicare Other | Admitting: Oncology

## 2017-01-24 ENCOUNTER — Encounter (HOSPITAL_COMMUNITY): Payer: Self-pay

## 2017-01-24 DIAGNOSIS — I361 Nonrheumatic tricuspid (valve) insufficiency: Secondary | ICD-10-CM

## 2017-01-24 DIAGNOSIS — I059 Rheumatic mitral valve disease, unspecified: Secondary | ICD-10-CM

## 2017-01-24 DIAGNOSIS — R7881 Bacteremia: Secondary | ICD-10-CM

## 2017-01-24 DIAGNOSIS — I058 Other rheumatic mitral valve diseases: Secondary | ICD-10-CM

## 2017-01-24 HISTORY — PX: TEE WITHOUT CARDIOVERSION: SHX5443

## 2017-01-24 LAB — CULTURE, BLOOD (ROUTINE X 2)
CULTURE: NO GROWTH
Culture: NO GROWTH

## 2017-01-24 SURGERY — ECHOCARDIOGRAM, TRANSESOPHAGEAL
Anesthesia: Monitor Anesthesia Care

## 2017-01-24 MED ORDER — FENTANYL 25 MCG/HR TD PT72
25.0000 ug | MEDICATED_PATCH | TRANSDERMAL | Status: DC
  Administered 2017-01-24: 25 ug via TRANSDERMAL
  Filled 2017-01-24: qty 1

## 2017-01-24 MED ORDER — PROPOFOL 500 MG/50ML IV EMUL
INTRAVENOUS | Status: DC | PRN
  Administered 2017-01-24: 100 ug/kg/min via INTRAVENOUS

## 2017-01-24 MED ORDER — METOPROLOL TARTRATE 25 MG PO TABS: 12.5000 mg | ORAL_TABLET | Freq: Two times a day (BID) | ORAL | 0 refills | Status: DC

## 2017-01-24 MED ORDER — HYDROCODONE-ACETAMINOPHEN 5-325 MG PO TABS: 1.0000 | ORAL_TABLET | Freq: Three times a day (TID) | ORAL | 0 refills | Status: DC | PRN

## 2017-01-24 MED ORDER — METOPROLOL TARTRATE 12.5 MG HALF TABLET
12.5000 mg | ORAL_TABLET | Freq: Two times a day (BID) | ORAL | Status: DC
  Administered 2017-01-24: 12.5 mg via ORAL
  Filled 2017-01-24: qty 1

## 2017-01-24 MED ORDER — PROPOFOL 10 MG/ML IV BOLUS
INTRAVENOUS | Status: DC | PRN
  Administered 2017-01-24 (×2): 20 mg via INTRAVENOUS

## 2017-01-24 MED ORDER — BUTAMBEN-TETRACAINE-BENZOCAINE 2-2-14 % EX AERO
INHALATION_SPRAY | CUTANEOUS | Status: DC | PRN
  Administered 2017-01-24: 2 via TOPICAL

## 2017-01-24 NOTE — Progress Notes (Signed)
  Echocardiogram Echocardiogram Transesophageal has been performed.  Tresa Res 01/24/2017, 10:36 AM

## 2017-01-24 NOTE — Telephone Encounter (Signed)
Dr Sloan Leiter (hopistalist @ [facility]) would like to speak to you about this patient. His cell number is [phone]

## 2017-01-24 NOTE — Anesthesia Procedure Notes (Signed)
Procedure Name: MAC Date/Time: 01/24/2017 8:52 AM Performed by: Jenne Campus Pre-anesthesia Checklist: Patient identified, Emergency Drugs available, Suction available, Patient being monitored and Timeout performed Oxygen Delivery Method: Nasal cannula

## 2017-01-24 NOTE — Interval H&P Note (Signed)
History and Physical Interval Note:  01/24/2017 8:34 AM  Rachel Butler  has presented today for surgery, with the diagnosis of BACTEREMIA  The various methods of treatment have been discussed with the patient and family. After consideration of risks, benefits and other options for treatment, the patient has consented to  Procedure(s): TRANSESOPHAGEAL ECHOCARDIOGRAM (TEE)  WITH ANESTHESIA (N/A) as a surgical intervention .  The patient's history has been reviewed, patient examined, no change in status, stable for surgery.  I have reviewed the patient's chart and labs.  Questions were answered to the patient's satisfaction.     Fransico Him

## 2017-01-24 NOTE — Care Management Note (Signed)
Case Management Note  Patient Details  Name: RANYA RICHBERG MRN: [id number] Date of Birth: December 11, 1955  Subjective/Objective:   Pt presented for Sepsis. Plan for d/c home today with [place]. Agency List provided to patient.                  Action/Plan: Patient chose [facility] for DME RW and HH PT Services. Referral provided to [facility] for DME andRW will be delivered to room. HH referral given to [facility]. SOC to begin within 24-48 hours post d/c. No further needs from CM at this time.   Expected Discharge Date:  01/24/17               Expected Discharge Plan:  [place]  In-House Referral:  NA  Discharge planning Services  CM Consult  Post Acute Care Choice:  Home Health, Durable Medical Equipment Choice offered to:  Patient  DME Arranged:  Walker rolling DME Agency:  [place]:  PT [facility] Agency:  [place]  Status of Service:  Completed, signed off  If discussed at [place] of Stay Meetings, dates discussed:    Additional Comments:  Bethena Roys, RN 01/24/2017, 3:07 PM

## 2017-01-24 NOTE — Progress Notes (Signed)
Physical Therapy Treatment Patient Details Name: Rachel Butler MRN: [id number] DOB: 1956/01/17 Today's Date: 01/24/2017    History of Present Illness Pt is a 61 y.o. female admitted following outpatient liver biopsy as she developed fever, tachycardia, hypotension and worsening RUQ pain. PMH includes DDD and chronic pain syndrome. MRI positive for multifocal heterogeneous enhancing liver lesions worrisome for diffuse hepatic metastasis. CT shows aortoiliac atherosclerosis, cholelithiasis, and pelvic ascites. Pt also with acute periphatic hemorrhage after L hepatic mass core biopsy with L hepatic artery gelfoam embo on 2/23; and R groin bleed s/p angio.      PT Comments    Pt performed increased mobility and performed stair training to ensure safe entry into home at d/c.  Pt reports no questions during tx.  D/c order in chart.  Pt will continue to require HHPT and RW at d/c.  Pt educated to continue ambulation at home to improve strength, endurance and respiratory function.      Follow Up Recommendations  Home health PT     Equipment Recommendations  Rolling walker with 5" wheels    Recommendations for Other Services       Precautions / Restrictions Precautions Precautions: Fall Precaution Comments: Watch BP and O2 Restrictions Weight Bearing Restrictions: No    Mobility  Bed Mobility               General bed mobility comments: Pt received seated in chair  Transfers Overall transfer level: Needs assistance Equipment used: Rolling walker (2 wheeled) Transfers: Sit to/from Stand Sit to Stand: Supervision         General transfer comment: cues for hand placement to and from seated surface.    Ambulation/Gait Ambulation/Gait assistance: Supervision Ambulation Distance (Feet): 200 Feet Assistive device: Rolling walker (2 wheeled) Gait Pattern/deviations: Step-through pattern;Decreased stride length Gait velocity: decreased Gait velocity interpretation: Below  normal speed for age/gender General Gait Details: Cues for posture, increasing stride, and purse lip breathing.  Pt HR elevated to 121 bpm, SPO2 94%-98% with one drop to 88% after negotiating 3 stairs it wuickly returned to 94%.     Stairs Stairs: Yes   Stair Management: Two rails Number of Stairs: 3 General stair comments: Cues for sequencing and RW placement(informed patient's spouse to take RW to top of stairs before negotiating to improve safety).  Pt fatigue with limited stair negotiation.    Wheelchair Mobility    Modified Rankin (Stroke Patients Only)       Balance     Sitting balance-Leahy Scale: Good       Standing balance-Leahy Scale: Fair                      Cognition Arousal/Alertness: Awake/alert Behavior During Therapy: WFL for tasks assessed/performed Overall Cognitive Status: Within Functional Limits for tasks assessed                      Exercises      General Comments        Pertinent Vitals/Pain Pain Assessment: No/denies pain    Home Living                      Prior Function            PT Goals (current goals can now be found in the care plan section) Acute Rehab PT Goals Patient Stated Goal: Return home Potential to Achieve Goals: Good Progress towards PT goals: Progressing toward goals  Frequency    Min 3X/week      PT Plan Current plan remains appropriate    Co-evaluation             End of Session Equipment Utilized During Treatment: Gait belt Activity Tolerance: Patient tolerated treatment well Patient left: in chair;with call bell/phone within reach Nurse Communication: Mobility status PT Visit Diagnosis: Unsteadiness on feet (R26.81);Muscle weakness (generalized) (M62.81)     Time: [id number] PT Time Calculation (min) (ACUTE ONLY): 18 min  Charges:  $Gait Training: 8-22 mins                    G Codes:       Cristela Blue 2017-02-18, 2:29 PM Governor Rooks, PTA pager  [phone]

## 2017-01-24 NOTE — Anesthesia Preprocedure Evaluation (Addendum)
Anesthesia Evaluation  Patient identified by MRN, date of birth, ID band Patient awake    Reviewed: Allergy & Precautions, NPO status , Patient's Chart, lab work & pertinent test results  Airway Mallampati: III  TM Distance: >3 FB Neck ROM: Full    Dental no notable dental hx. (+) Edentulous Upper, Edentulous Lower, Dental Advisory Given   Pulmonary neg pulmonary ROS, Current Smoker,    Pulmonary exam normal breath sounds clear to auscultation       Cardiovascular Normal cardiovascular exam+ Valvular Problems/Murmurs MR  Rhythm:Regular Rate:Normal  Echo 01/22/2107 - Left ventricle: The cavity size was normal. Wall thickness was normal. Systolic function was normal. The estimated ejection fraction was in the range of 60% to 65%. Wall motion was normal; there were no regional wall motion abnormalities. Doppler parameters are consistent with abnormal left ventricular relaxation (grade 1 diastolic dysfunction). The E/e&' ratio is >15, suggesting elevated LV filling pressure. - Mitral valve: Thickened leaflets with possible vegetation   associated on the anterior mitral leaflet. There was severe regurgitation. - Left atrium: The atrium was mildly dilated. - Tricuspid valve: There was moderate regurgitation. - Pulmonary arteries: PA peak pressure: 56 mm Hg (S). - Inferior vena cava: The vessel was normal in size. The   respirophasic diameter changes were in the normal range (>= 50%), consistent with normal central venous pressure.  Impressions: - LVEF 60-65%, normal wall thickness, normal wall motion, diastolic dysfunction with elevated LV filling pressure, severe MR with  possible vegetation of the anterior mitral leaflet, moderate TR,  RVSP 56 mmHg. TEE is strongly recommended to further characterize  the etiology of severe mitral valve regurgitation, however,  vegetation on the mitral valve is suspected.   Neuro/Psych  Headaches, negative  psych ROS   GI/Hepatic negative GI ROS, Neg liver ROS,   Endo/Other  negative endocrine ROS  Renal/GU negative Renal ROS     Musculoskeletal negative musculoskeletal ROS (+)   Abdominal   Peds  Hematology negative hematology ROS (+)   Anesthesia Other Findings   Reproductive/Obstetrics negative OB ROS                            Anesthesia Physical Anesthesia Plan  ASA: III  Anesthesia Plan: MAC   Post-op Pain Management:    Induction:   Airway Management Planned: Nasal Cannula  Additional Equipment:   Intra-op Plan:   Post-operative Plan:   Informed Consent: I have reviewed the patients History and Physical, chart, labs and discussed the procedure including the risks, benefits and alternatives for the proposed anesthesia with the patient or authorized representative who has indicated his/her understanding and acceptance.   Dental advisory given  Plan Discussed with: CRNA  Anesthesia Plan Comments:         Anesthesia Quick Evaluation

## 2017-01-24 NOTE — Anesthesia Postprocedure Evaluation (Signed)
Anesthesia Post Note  Patient: Rachel Butler  Procedure(s) Performed: Procedure(s) (LRB): TRANSESOPHAGEAL ECHOCARDIOGRAM (TEE)  WITH ANESTHESIA (N/A)  Patient location during evaluation: PACU Anesthesia Type: MAC Level of consciousness: awake and alert Pain management: pain level controlled Vital Signs Assessment: post-procedure vital signs reviewed and stable Respiratory status: spontaneous breathing Cardiovascular status: stable Anesthetic complications: no       Last Vitals:  Vitals:   01/24/17 0930 01/24/17 0940  BP: (!) 147/70 (!) 158/72  Pulse: (!) 102 94  Resp: 17 15  Temp:      Last Pain:  Vitals:   01/24/17 0919  TempSrc: Oral  PainSc:                  Nolon Nations

## 2017-01-24 NOTE — Telephone Encounter (Signed)
Spoke to him. She is discharged---though still in the room (I spoke to her also) No evidence of infection and valve not worrisome on TEE.  Rosaria Ferries, Please try to get her in with [facility] oncologist tomorrow or Monday.

## 2017-01-24 NOTE — Discharge Summary (Signed)
PATIENT DETAILS Name: Rachel Butler Age: 61 y.o. Sex: female Date of Birth: 1956-07-28 MRN: [id number]. Admitting Physician: Jonetta Osgood, MD [id number] Silvio Pate, MD  Admit Date: 01/18/2017 Discharge date: 01/24/2017  Recommendations for Outpatient Follow-up:  1. Follow up with PCP in 1weeks 2. Please obtain BMP/CBC in one week 3. Please refer to oncology upon follow-up with PCP. May also need to involve hospice at some point. 4. Please follow up on the following pending results:  Admitted From:  Home  Disposition: Home with home health services   [place]:  Yes  Equipment/Devices: None  Discharge Condition: Stable  CODE STATUS: FULL CODE  Diet recommendation:  Heart Healthy  Brief Summary: See H&P, Labs, Consult and Test reports for all details in brief, Patient is a 61 y.o. female with past medical history of chronic pain syndrome-due to degenerative disc disease on chronic narcotics, long-standing history of tobacco use-unexplained weight loss-diagnosed with multiple liver lesions on an outpatient MRI-admitted following an episode of hypotension, tachycardia post liver biopsy. Hospital course has been complicated by fever, tachycardia, and acute blood loss anemia. See below for further details  Brief Hospital Course: Hypotension-due to perihepatic hemorrhage post liver biopsy: I do not think that the patient had sepsis on admission, suspect hypotension was probably related to acute blood loss post liver biopsy. Blood cultures are negative-she was empirically covered with antibiotics-these have now been stopped on 2/28. Her blood pressure is now stable, she is status post numerous units of PRBC and hepatic artery embolization by IR.  Acute blood loss anemia: Secondary to above. Required PRBC transfusion, hemoglobin currently stable.  Fever/tachycardia: Thankfully now afebrile, blood cultures negative. TEE negative for vegetation-did show some mitral valve  thickening. Could have had fever, tachycardia could have been from intractable pain, anemia and underlying malignancy. Continue to follow CBC as outpatient, continue to optimize pain control.  Mitral regurgitation with MV Leaflet thickening: Transthoracic echocardiogram showed severe MR with possible vegetation on the anterior mitral leaflet, cardiology consulted and underwent TEE on 3/1-which showed moderate MR, with moderately thickened mitral valve leaflets. 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 ID was consulted during this hospital course, all antibiotics have been discontinued, had spoken with infectious disease M.D.-Dr. Baxter Flattery on 2/28, no further antimicrobial therapy recommended. Given her poor overall prognoses, doubt any further workup or evaluation is needed for mitral regurgitation.   Persistent leukocytosis with thrombocytosis: Initially thought to have infection-but with negative blood cultures, and underlying malignancy-suspect she has leukemoid reaction and reactive thrombocytosis due to her underlying malignancy issues.  Multiple Liver Lesions: Underwent liver biopsy on 2/23--pathology suggestive of adenocarcinoma-with breast primary. Further evaluation will be deferred to the outpatient setting  Chronic Back Pain with chronic narcotic dependence: follows with pain medicine at [facility] held briefly due to initialhypotension-resumed transdermal fentanyl and Percocet-pain adequately controlled. Have prescribed patient a few days supply of Percocet at her request-she will need to get i touch with her pain management M.D. for further continued needs.  COPD:stable, no wheezing, no cough  Nicotine dependence:. Smoking cessation education provided.   Procedures/Studies: TEE 3/1>> Normal LV size and hyperdynamic LV function EF 65-70% Normal RV size and function Normal RA Mildly  dilated LA Normal TV with mild to moderate TR Normal PV Moderately thickened MV leaflets predominantly on the tip of the anterior MV leaflet with moderate MR.  There is no evidence of flow into the pulmonary veins. Normal trileaflet AV Normal  interatrial septum with no evidence of shunt by colorflow dopper  Normal thoracic and ascending aorta.   Transthoracic Echo 2/27 - LVEF 60-65%, normal wall thickness, normal wall motion, diastolic dysfunction with elevated LV filling pressure, severe MR with possible vegetation of the anterior mitral leaflet, moderate TR, RVSP 56 mmHg. TEE is strongly recommended to further characterize the etiology of severe mitral valve regurgitation, however, vegetation on the mitral valve is suspected.  2/23>> SELECTIVE VISCERAL ARTERIOGRAPHY; IR EMBO ART VEN HEMORR LYMPH EXTRAV INC GUIDE ROADMAPPING; ADDITIONAL ARTERIOGRAPHY; IR ULTRASOUND GUIDANCE VASC ACCESS RIGHT  2/23>> ULTRASOUND BIOPSY CORE LIVER  Discharge Diagnoses:  Principal Problem:   Sepsis ([place]) Active Problems:   Nicotine dependence   Lumbar disc disease   Chronic pain   Liver lesion   Bacteremia   Mitral valve mass   Discharge Instructions:  Activity:  As tolerated with Full fall precautions use walker/cane & assistance as needed  Discharge Instructions    Call MD for:  persistant nausea and vomiting    Complete by:  As directed    Call MD for:  severe uncontrolled pain    Complete by:  As directed    Diet - low sodium heart healthy    Complete by:  As directed    Discharge instructions    Complete by:  As directed    Follow with Primary MD  Viviana Simpler, MD in 1 week  Please ask your primary M.D. to refer you to a oncologist of your choice   Please get a complete blood count and chemistry panel checked by your Primary MD at your next visit, and again as instructed by your Primary MD.  Get Medicines reviewed and adjusted: Please take all your medications  with you for your next visit with your Primary MD  Laboratory/radiological data: Please request your Primary MD to go over all hospital tests and procedure/radiological results at the follow up, please ask your Primary MD to get all Hospital records sent to his/her office.  In some cases, they will be blood work, cultures and biopsy results pending at the time of your discharge. Please request that your primary care M.D. follows up on these results.  Also Note the following: If you experience worsening of your admission symptoms, develop shortness of breath, life threatening emergency, suicidal or homicidal thoughts you must seek medical attention immediately by calling 911 or calling your MD immediately  if symptoms less severe.  You must read complete instructions/literature along with all the possible adverse reactions/side effects for all the Medicines you take and that have been prescribed to you. Take any new Medicines after you have completely understood and accpet all the possible adverse reactions/side effects.   Do not drive when taking Pain medications or sleeping medications (Benzodaizepines)  Do not take more than prescribed Pain, Sleep and Anxiety Medications. It is not advisable to combine anxiety,sleep and pain medications without talking with your primary care practitioner  Special Instructions: If you have smoked or chewed Tobacco  in the last 2 yrs please stop smoking, stop any regular Alcohol  and or any Recreational drug use.  Wear Seat belts while driving.  Please note: You were cared for by a hospitalist during your hospital stay. Once you are discharged, your primary care physician will handle any further medical issues. Please note that NO REFILLS for any discharge medications will be authorized once you are discharged, as it is imperative that you return to your primary care physician (or establish a relationship  with a primary care physician if you do not have one) for  your post hospital discharge needs so that they can reassess your need for medications and monitor your lab values.   Face-to-face encounter (required for Medicare/Medicaid patients)    Complete by:  As directed    I Xu,Fang certify that this patient is under my care and that I, or a nurse practitioner or physician's assistant working with me, had a face-to-face encounter that meets the physician face-to-face encounter requirements with this patient on 01/21/2017. The encounter with the patient was in whole, or in part for the following medical condition(s) which is the primary reason for home health care (List medical condition): FTT   The encounter with the patient was in whole, or in part, for the following medical condition, which is the primary reason for home health care:  FTT   I certify that, based on my findings, the following services are medically necessary home health services:   Nursing Physical therapy     Reason for Medically Necessary Home Health Services:  Skilled Nursing- Change/Decline in Patient Status   My clinical findings support the need for the above services:  Pain interferes with ambulation/mobility   Further, I certify that my clinical findings support that this patient is homebound due to:  Shortness of Breath with activity   For home use only DME Walker rolling    Complete by:  As directed    5wheels   Patient needs a walker to treat with the following condition:  Gait difficulty   For home use only DME oxygen    Complete by:  As directed    Mode or (Route):  Mask   Liters per Minute:  2   Frequency:  Continuous (stationary and portable oxygen unit needed)   Oxygen conserving device:  Yes   Oxygen delivery system:  Gas   Home Health    Complete by:  As directed    To provide the following care/treatments:   PT RN Social work     Increase activity slowly    Complete by:  As directed      Allergies as of 01/24/2017      Reactions   Endoxcin  [lidocaine-menthol]    Nubain [nalbuphine Hcl]    Penicillins    Robaxin [methocarbamol]       Medication List    STOP taking these medications   BC HEADACHE POWDER PO   furosemide 40 MG tablet Commonly known as:  LASIX   LORazepam 0.5 MG tablet Commonly known as:  ATIVAN     TAKE these medications   BIOTIN 5000 PO Take by mouth daily.   fentaNYL 25 MCG/HR patch Commonly known as:  DURAGESIC - dosed mcg/hr Place 25 mcg onto the skin every 3 (three) days.   gabapentin 800 MG tablet Commonly known as:  NEURONTIN Take 1 tablet (800 mg total) by mouth 3 (three) times daily.   HYDROcodone-acetaminophen 5-325 MG tablet Commonly known as:  NORCO/VICODIN Take 1 tablet by mouth 3 (three) times daily as needed. What changed:  reasons to take this   metoprolol tartrate 25 MG tablet Commonly known as:  LOPRESSOR Take 0.5 tablets (12.5 mg total) by mouth 2 (two) times daily.   multivitamin with minerals tablet Take 1 tablet by mouth daily.   polyethylene glycol packet Commonly known as:  MIRALAX / GLYCOLAX Take 17 g by mouth daily.   promethazine 25 MG tablet Commonly known as:  PHENERGAN TAKE 0.5-1 TABLETS (  12.5-25 MG TOTAL) BY MOUTH 3 (THREE) TIMES DAILY AS NEEDED FOR NAUSEA.   sennosides-docusate sodium 8.6-50 MG tablet Commonly known as:  SENOKOT-S Take 2 tablets by mouth daily.            Durable Medical Equipment        Start     Ordered   01/22/17 0000  For home use only DME Walker rolling    Comments:  5wheels  Question:  Patient needs a walker to treat with the following condition  Answer:  Gait difficulty   01/22/17 1253   01/22/17 0000  For home use only DME oxygen    Question Answer Comment  Mode or (Route) Mask   Liters per Minute 2   Frequency Continuous (stationary and portable oxygen unit needed)   Oxygen conserving device Yes   Oxygen delivery system Gas      01/22/17 1253     Follow-up Information    Viviana Simpler, MD Follow up in  1 week(s).   Specialties:  Internal Medicine, Pediatrics Why:  hospital discharge follow up , repeat cbc/cmp at follow up. Contact information: [place] [address] [facility] Follow up.   Specialty:  Oncology Why:  for metastatic adenocarcinoma Contact information: [place] [id number] [place] [id number] [phone]         Allergies  Allergen Reactions  . Endoxcin [Lidocaine-Menthol]   . Nubain [Nalbuphine Hcl]   . Penicillins   . Robaxin [Methocarbamol]    Consultations:   cardiology, ID and IR  Other Procedures/Studies: Dg Chest 2 View  Result Date: 01/16/2017 CLINICAL DATA:  Liver metastases.  Tobacco use. EXAM: CHEST  2 VIEW COMPARISON:  August 25, 2015 FINDINGS: There are nipple shadows bilaterally. There is no evident edema or consolidation. No pulmonary nodular lesion is demonstrable by radiography. Heart size and pulmonary vascularity are normal. No adenopathy. There is atherosclerotic calcification in the aorta. No blastic or lytic bone lesions. IMPRESSION: Aortic atherosclerosis. No edema or consolidation. No evident mass or adenopathy. Electronically Signed   By: Lowella Grip III M.D.   On: 01/16/2017 14:34   Ct Angio Chest Pe W Or Wo Contrast  Addendum Date: 01/23/2017   ADDENDUM REPORT: 01/23/2017 14:00 ADDENDUM: Voice recognition error: The third sentence of the Findings section should read "There is no focal filling defect to suggest pulmonary embolus." Electronically Signed   By: San Morelle M.D.   On: 01/23/2017 14:00   Result Date: 01/23/2017 CLINICAL DATA:  Hypoxia. EXAM: CT ANGIOGRAPHY CHEST WITH CONTRAST TECHNIQUE: Multidetector CT imaging of the chest was performed using the standard protocol during bolus administration of intravenous contrast. Multiplanar CT image reconstructions and MIPs were obtained to evaluate the vascular  anatomy. CONTRAST:  80 mL Isovue 370 COMPARISON:  One-view chest x-ray 01/18/2017. CT of the abdomen and pelvis 01/20/2017 FINDINGS: Cardiovascular: The heart size is normal. The pulmonary arteries demonstrate excellent opacification. There is focal filling defect to suggest pulmonary embolus. Pulmonary arterial size is normal. The aorta demonstrates atherosclerotic change without aneurysm. A 4 vessel arch configuration is present. The left vertebral artery Mediastinum/Nodes: No significant mediastinal or axillary adenopathy is present. The esophagus is unremarkable. Secretions versus small tracheal nodules are evident. Lungs/Pleura: Centrilobular emphysema is present. No focal nodule or mass lesion is present. Mild dependent atelectasis is present bilaterally. There are small bilateral pleural effusions. Upper Abdomen: Liver metastases are again  noted. Musculoskeletal: No focal lytic or blastic lesions are present. Vertebral body heights alignment are maintained. Review of the MIP images confirms the above findings. IMPRESSION: 1. No pulmonary embolus. 2. Centrilobular emphysema. 3. Secretions versus small tracheal nodules. Short-term follow-up CT scan of or bronchoscopy would be useful for further evaluation. 4. Aortic atherosclerosis. 5. Liver metastases. Electronically Signed: By: San Morelle M.D. On: 01/22/2017 09:53   Mr Abdomen Wwo Contrast  Result Date: 01/15/2017 CLINICAL DATA:  Evaluate liver lesions noted on ultrasound. EXAM: MRI ABDOMEN WITHOUT AND WITH CONTRAST TECHNIQUE: Multiplanar multisequence MR imaging of the abdomen was performed both before and after the administration of intravenous contrast. CONTRAST:  62mL MULTIHANCE GADOBENATE DIMEGLUMINE 529 MG/ML IV SOLN COMPARISON:  None. FINDINGS: Lower chest: No acute findings. Hepatobiliary: The liver is enlarged containing multiple ill defined heterogeneous and peripheral enhancing lesions worrisome for multifocal metastatic disease. The  largest lesion is in the medial segment of left lobe measuring 6 cm, image number 26 of series 6. Lesion within segment 8 measures 3.2 cm, image number 8 of series 6. Segment 5 lesion measures 3.1 cm, image 31 of series 6. The gallbladder fundus extends into the large mass within the medial segment of left lobe, image number 65 of series 1401. No biliary dilatation. Pancreas:  No inflammation or mass. Spleen:  Within normal limits in size and appearance. Adrenals/Urinary Tract: No masses identified. No evidence of hydronephrosis. Stomach/Bowel: The stomach is normal. The small bowel loops have a normal course and caliber. Visualized portions of the colon are unremarkable. Vascular/Lymphatic: Normal appearance of the abdominal aorta. Aortic atherosclerosis noted. Periaortic node is prominent measuring 8 mm, image number 50 of series 1401. Necrotic appearing portacaval node measures 1.5 cm, image 55 of series 1405. Porta hepatis node measures 1.3 cm, image 49 of series 1404 and has mass effect upon the extrahepatic portal vein. Other:  None. Musculoskeletal: No suspicious bone lesions identified. IMPRESSION: 1. Examination is positive for multifocal heterogeneous enhancing liver lesions worrisome for diffuse hepatic metastasis. Correlation with tissue sampling is advised. The dominant mass appears contiguous with the gallbladder fundus. Cannot rule out gallbladder carcinoma or other GI primary. 2. Enlarged portacaval and porta hepatic lymph nodes worrisome for metastatic adenopathy. Electronically Signed   By: Kerby Moors M.D.   On: 01/15/2017 16:09   Ir Angiogram Visceral Selective  Result Date: 01/19/2017 INDICATION: Acute abdominal pain with acute perihepatic hemorrhage 2 hours status post percutaneous left hepatic metastasis biopsy. EXAM: SELECTIVE VISCERAL ARTERIOGRAPHY; IR EMBO ART VEN HEMORR LYMPH EXTRAV INC GUIDE ROADMAPPING; ADDITIONAL ARTERIOGRAPHY; IR ULTRASOUND GUIDANCE VASC ACCESS RIGHT  MEDICATIONS: 1% lidocaine locally. ANESTHESIA/SEDATION: Moderate (conscious) sedation was employed during this procedure. A total of Versed 1.0 mg and Fentanyl 25 mcg was administered intravenously. Moderate Sedation Time: 50 minutes. The patient's level of consciousness and vital signs were monitored continuously by radiology nursing throughout the procedure under my direct supervision. CONTRAST:  1 ISOVUE-300 IOPAMIDOL (ISOVUE-300) INJECTION 61%, 1 ISOVUE-300 IOPAMIDOL (ISOVUE-300) INJECTION 61% FLUOROSCOPY TIME:  Fluoroscopy Time: 14 minutes 12 seconds (144 mGy). COMPLICATIONS: None immediate. PROCEDURE: Informed consent was obtained from the patient following explanation of the procedure, risks, benefits and alternatives. The patient understands, agrees and consents for the procedure. All questions were addressed. A time out was performed prior to the initiation of the procedure. Maximal barrier sterile technique utilized including caps, mask, sterile gowns, sterile gloves, large sterile drape, hand hygiene, and Betadine prep. Under sterile conditions and local anesthesia, ultrasound micropuncture access performed of the  right common femoral artery. Five French sheath inserted. SOS catheter was utilized to select the celiac origin. Celiac angiogram performed. Celiac: Celiac origin is widely patent. Splenic, common hepatic, gastroduodenal artery, proper hepatic, right and left hepatic arteries are all patent. Irregularity of the hepatic vasculature is secondary to hepatomegaly and diffuse hepatic metastases. No active bleeding demonstrated. On the venous phase, the splenic vein and portal vein are all patent. Through the [place] a 5 French catheter, a Renegade STC microcatheter was advanced over a micro glidewire into the proper hepatic artery. Proper hepatic angiogram: Proper hepatic, right and left hepatic arteries are patent. Vasospasm present in the proximal right hepatic artery related to wire manipulation.  Limited assessment of the vasculature because of the contrast injection and poor opacification of the left hepatic artery. Over a double angled Glidewire, the microcatheter was advanced into the left hepatic artery. Left hepatic angiogram performed. Left hepatic: Left hepatic artery is patent. This is the dominant supply to the left hepatic lobe lateral segment were the percutaneous biopsy was performed. Peripheral irregularity of the arterial vasculature related to hepatic metastases. No active extravasation, acute bleeding, or other acute vascular abnormality including AV fistula or pseudoaneurysm. Because of the significant recent acute perihepatic hemorrhage and a biopsy performed in the left hepatic lobe lateral segment, Gel-Foam embolization of left hepatic artery will be performed. Left hepatic artery Gel-Foam embolization: Through the microcatheter, approximately 5 cc of Gel-Foam slurry was instilled into left hepatic artery. Following embolization, there is significant reduction in the left hepatic arterial peripheral vasculature and sluggish flow but the left hepatic main artery remains patent. Catheter was retracted and advanced into a left hepatic branch supplying the medial segment. Additional left hepatic angiogram performed. Additional left hepatic angiogram: Irregularity of the parenchymal staining related to diffuse hepatic metastases. Scattered peripheral occlusions present in the additional left hepatic branch related to the Gel-Foam embolization. Vasospasm noted at the catheter site. No active bleeding demonstrated. Microcatheter was removed. A final angiogram was performed through the SOS 5 French catheter of the celiac origin. Celiac: Following left hepatic Gel-Foam embolization, there is significant reduction in the arterial vascularity to the the left hepatic lobe where the biopsy was performed. Residual vasospasm present in the proximal left hepatic artery. No other significant acute  vascular process. Catheter was retracted and advanced into the SMA. SMA angiogram performed. SMA angiogram: SMA is widely patent. No variant SMA supply demonstrated to the liver. On the venous phase, the superior mesenteric vein and portal vein all remain patent. Access removed. Hemostasis obtained with manual compression of the right common femoral artery puncture site. Patient tolerated the procedure well. No immediate complication. IMPRESSION: Successful celiac, hepatic, and SMA angiograms as above. Successful micro catheterization, angiograms, and Gel-Foam embolization of the left hepatic artery resulting in significant reduction in arterial flow to the area the liver were the percutaneous biopsy was performed. Electronically Signed   By: Jerilynn Mages.  Shick M.D.   On: 01/19/2017 10:01   Ir Angiogram Visceral Selective  Result Date: 01/19/2017 INDICATION: Acute abdominal pain with acute perihepatic hemorrhage 2 hours status post percutaneous left hepatic metastasis biopsy. EXAM: SELECTIVE VISCERAL ARTERIOGRAPHY; IR EMBO ART VEN HEMORR LYMPH EXTRAV INC GUIDE ROADMAPPING; ADDITIONAL ARTERIOGRAPHY; IR ULTRASOUND GUIDANCE VASC ACCESS RIGHT MEDICATIONS: 1% lidocaine locally. ANESTHESIA/SEDATION: Moderate (conscious) sedation was employed during this procedure. A total of Versed 1.0 mg and Fentanyl 25 mcg was administered intravenously. Moderate Sedation Time: 50 minutes. The patient's level of consciousness and vital signs were monitored continuously by  radiology nursing throughout the procedure under my direct supervision. CONTRAST:  1 ISOVUE-300 IOPAMIDOL (ISOVUE-300) INJECTION 61%, 1 ISOVUE-300 IOPAMIDOL (ISOVUE-300) INJECTION 61% FLUOROSCOPY TIME:  Fluoroscopy Time: 14 minutes 12 seconds (144 mGy). COMPLICATIONS: None immediate. PROCEDURE: Informed consent was obtained from the patient following explanation of the procedure, risks, benefits and alternatives. The patient understands, agrees and consents for the  procedure. All questions were addressed. A time out was performed prior to the initiation of the procedure. Maximal barrier sterile technique utilized including caps, mask, sterile gowns, sterile gloves, large sterile drape, hand hygiene, and Betadine prep. Under sterile conditions and local anesthesia, ultrasound micropuncture access performed of the right common femoral artery. Five French sheath inserted. SOS catheter was utilized to select the celiac origin. Celiac angiogram performed. Celiac: Celiac origin is widely patent. Splenic, common hepatic, gastroduodenal artery, proper hepatic, right and left hepatic arteries are all patent. Irregularity of the hepatic vasculature is secondary to hepatomegaly and diffuse hepatic metastases. No active bleeding demonstrated. On the venous phase, the splenic vein and portal vein are all patent. Through the [place] a 5 French catheter, a Renegade STC microcatheter was advanced over a micro glidewire into the proper hepatic artery. Proper hepatic angiogram: Proper hepatic, right and left hepatic arteries are patent. Vasospasm present in the proximal right hepatic artery related to wire manipulation. Limited assessment of the vasculature because of the contrast injection and poor opacification of the left hepatic artery. Over a double angled Glidewire, the microcatheter was advanced into the left hepatic artery. Left hepatic angiogram performed. Left hepatic: Left hepatic artery is patent. This is the dominant supply to the left hepatic lobe lateral segment were the percutaneous biopsy was performed. Peripheral irregularity of the arterial vasculature related to hepatic metastases. No active extravasation, acute bleeding, or other acute vascular abnormality including AV fistula or pseudoaneurysm. Because of the significant recent acute perihepatic hemorrhage and a biopsy performed in the left hepatic lobe lateral segment, Gel-Foam embolization of left hepatic artery will be  performed. Left hepatic artery Gel-Foam embolization: Through the microcatheter, approximately 5 cc of Gel-Foam slurry was instilled into left hepatic artery. Following embolization, there is significant reduction in the left hepatic arterial peripheral vasculature and sluggish flow but the left hepatic main artery remains patent. Catheter was retracted and advanced into a left hepatic branch supplying the medial segment. Additional left hepatic angiogram performed. Additional left hepatic angiogram: Irregularity of the parenchymal staining related to diffuse hepatic metastases. Scattered peripheral occlusions present in the additional left hepatic branch related to the Gel-Foam embolization. Vasospasm noted at the catheter site. No active bleeding demonstrated. Microcatheter was removed. A final angiogram was performed through the SOS 5 French catheter of the celiac origin. Celiac: Following left hepatic Gel-Foam embolization, there is significant reduction in the arterial vascularity to the the left hepatic lobe where the biopsy was performed. Residual vasospasm present in the proximal left hepatic artery. No other significant acute vascular process. Catheter was retracted and advanced into the SMA. SMA angiogram performed. SMA angiogram: SMA is widely patent. No variant SMA supply demonstrated to the liver. On the venous phase, the superior mesenteric vein and portal vein all remain patent. Access removed. Hemostasis obtained with manual compression of the right common femoral artery puncture site. Patient tolerated the procedure well. No immediate complication. IMPRESSION: Successful celiac, hepatic, and SMA angiograms as above. Successful micro catheterization, angiograms, and Gel-Foam embolization of the left hepatic artery resulting in significant reduction in arterial flow to the area the liver were  the percutaneous biopsy was performed. Electronically Signed   By: Jerilynn Mages.  Shick M.D.   On: 01/19/2017 10:01    Ir Angiogram Selective Each Additional Vessel  Result Date: 01/19/2017 INDICATION: Acute abdominal pain with acute perihepatic hemorrhage 2 hours status post percutaneous left hepatic metastasis biopsy. EXAM: SELECTIVE VISCERAL ARTERIOGRAPHY; IR EMBO ART VEN HEMORR LYMPH EXTRAV INC GUIDE ROADMAPPING; ADDITIONAL ARTERIOGRAPHY; IR ULTRASOUND GUIDANCE VASC ACCESS RIGHT MEDICATIONS: 1% lidocaine locally. ANESTHESIA/SEDATION: Moderate (conscious) sedation was employed during this procedure. A total of Versed 1.0 mg and Fentanyl 25 mcg was administered intravenously. Moderate Sedation Time: 50 minutes. The patient's level of consciousness and vital signs were monitored continuously by radiology nursing throughout the procedure under my direct supervision. CONTRAST:  1 ISOVUE-300 IOPAMIDOL (ISOVUE-300) INJECTION 61%, 1 ISOVUE-300 IOPAMIDOL (ISOVUE-300) INJECTION 61% FLUOROSCOPY TIME:  Fluoroscopy Time: 14 minutes 12 seconds (144 mGy). COMPLICATIONS: None immediate. PROCEDURE: Informed consent was obtained from the patient following explanation of the procedure, risks, benefits and alternatives. The patient understands, agrees and consents for the procedure. All questions were addressed. A time out was performed prior to the initiation of the procedure. Maximal barrier sterile technique utilized including caps, mask, sterile gowns, sterile gloves, large sterile drape, hand hygiene, and Betadine prep. Under sterile conditions and local anesthesia, ultrasound micropuncture access performed of the right common femoral artery. Five French sheath inserted. SOS catheter was utilized to select the celiac origin. Celiac angiogram performed. Celiac: Celiac origin is widely patent. Splenic, common hepatic, gastroduodenal artery, proper hepatic, right and left hepatic arteries are all patent. Irregularity of the hepatic vasculature is secondary to hepatomegaly and diffuse hepatic metastases. No active bleeding demonstrated. On  the venous phase, the splenic vein and portal vein are all patent. Through the [place] a 5 French catheter, a Renegade STC microcatheter was advanced over a micro glidewire into the proper hepatic artery. Proper hepatic angiogram: Proper hepatic, right and left hepatic arteries are patent. Vasospasm present in the proximal right hepatic artery related to wire manipulation. Limited assessment of the vasculature because of the contrast injection and poor opacification of the left hepatic artery. Over a double angled Glidewire, the microcatheter was advanced into the left hepatic artery. Left hepatic angiogram performed. Left hepatic: Left hepatic artery is patent. This is the dominant supply to the left hepatic lobe lateral segment were the percutaneous biopsy was performed. Peripheral irregularity of the arterial vasculature related to hepatic metastases. No active extravasation, acute bleeding, or other acute vascular abnormality including AV fistula or pseudoaneurysm. Because of the significant recent acute perihepatic hemorrhage and a biopsy performed in the left hepatic lobe lateral segment, Gel-Foam embolization of left hepatic artery will be performed. Left hepatic artery Gel-Foam embolization: Through the microcatheter, approximately 5 cc of Gel-Foam slurry was instilled into left hepatic artery. Following embolization, there is significant reduction in the left hepatic arterial peripheral vasculature and sluggish flow but the left hepatic main artery remains patent. Catheter was retracted and advanced into a left hepatic branch supplying the medial segment. Additional left hepatic angiogram performed. Additional left hepatic angiogram: Irregularity of the parenchymal staining related to diffuse hepatic metastases. Scattered peripheral occlusions present in the additional left hepatic branch related to the Gel-Foam embolization. Vasospasm noted at the catheter site. No active bleeding demonstrated. Microcatheter  was removed. A final angiogram was performed through the SOS 5 French catheter of the celiac origin. Celiac: Following left hepatic Gel-Foam embolization, there is significant reduction in the arterial vascularity to the the left hepatic lobe where the  biopsy was performed. Residual vasospasm present in the proximal left hepatic artery. No other significant acute vascular process. Catheter was retracted and advanced into the SMA. SMA angiogram performed. SMA angiogram: SMA is widely patent. No variant SMA supply demonstrated to the liver. On the venous phase, the superior mesenteric vein and portal vein all remain patent. Access removed. Hemostasis obtained with manual compression of the right common femoral artery puncture site. Patient tolerated the procedure well. No immediate complication. IMPRESSION: Successful celiac, hepatic, and SMA angiograms as above. Successful micro catheterization, angiograms, and Gel-Foam embolization of the left hepatic artery resulting in significant reduction in arterial flow to the area the liver were the percutaneous biopsy was performed. Electronically Signed   By: Jerilynn Mages.  Shick M.D.   On: 01/19/2017 10:01   Ir Angiogram Selective Each Additional Vessel  Result Date: 01/19/2017 INDICATION: Acute abdominal pain with acute perihepatic hemorrhage 2 hours status post percutaneous left hepatic metastasis biopsy. EXAM: SELECTIVE VISCERAL ARTERIOGRAPHY; IR EMBO ART VEN HEMORR LYMPH EXTRAV INC GUIDE ROADMAPPING; ADDITIONAL ARTERIOGRAPHY; IR ULTRASOUND GUIDANCE VASC ACCESS RIGHT MEDICATIONS: 1% lidocaine locally. ANESTHESIA/SEDATION: Moderate (conscious) sedation was employed during this procedure. A total of Versed 1.0 mg and Fentanyl 25 mcg was administered intravenously. Moderate Sedation Time: 50 minutes. The patient's level of consciousness and vital signs were monitored continuously by radiology nursing throughout the procedure under my direct supervision. CONTRAST:  1 ISOVUE-300  IOPAMIDOL (ISOVUE-300) INJECTION 61%, 1 ISOVUE-300 IOPAMIDOL (ISOVUE-300) INJECTION 61% FLUOROSCOPY TIME:  Fluoroscopy Time: 14 minutes 12 seconds (144 mGy). COMPLICATIONS: None immediate. PROCEDURE: Informed consent was obtained from the patient following explanation of the procedure, risks, benefits and alternatives. The patient understands, agrees and consents for the procedure. All questions were addressed. A time out was performed prior to the initiation of the procedure. Maximal barrier sterile technique utilized including caps, mask, sterile gowns, sterile gloves, large sterile drape, hand hygiene, and Betadine prep. Under sterile conditions and local anesthesia, ultrasound micropuncture access performed of the right common femoral artery. Five French sheath inserted. SOS catheter was utilized to select the celiac origin. Celiac angiogram performed. Celiac: Celiac origin is widely patent. Splenic, common hepatic, gastroduodenal artery, proper hepatic, right and left hepatic arteries are all patent. Irregularity of the hepatic vasculature is secondary to hepatomegaly and diffuse hepatic metastases. No active bleeding demonstrated. On the venous phase, the splenic vein and portal vein are all patent. Through the [place] a 5 French catheter, a Renegade STC microcatheter was advanced over a micro glidewire into the proper hepatic artery. Proper hepatic angiogram: Proper hepatic, right and left hepatic arteries are patent. Vasospasm present in the proximal right hepatic artery related to wire manipulation. Limited assessment of the vasculature because of the contrast injection and poor opacification of the left hepatic artery. Over a double angled Glidewire, the microcatheter was advanced into the left hepatic artery. Left hepatic angiogram performed. Left hepatic: Left hepatic artery is patent. This is the dominant supply to the left hepatic lobe lateral segment were the percutaneous biopsy was performed. Peripheral  irregularity of the arterial vasculature related to hepatic metastases. No active extravasation, acute bleeding, or other acute vascular abnormality including AV fistula or pseudoaneurysm. Because of the significant recent acute perihepatic hemorrhage and a biopsy performed in the left hepatic lobe lateral segment, Gel-Foam embolization of left hepatic artery will be performed. Left hepatic artery Gel-Foam embolization: Through the microcatheter, approximately 5 cc of Gel-Foam slurry was instilled into left hepatic artery. Following embolization, there is significant reduction in the left  hepatic arterial peripheral vasculature and sluggish flow but the left hepatic main artery remains patent. Catheter was retracted and advanced into a left hepatic branch supplying the medial segment. Additional left hepatic angiogram performed. Additional left hepatic angiogram: Irregularity of the parenchymal staining related to diffuse hepatic metastases. Scattered peripheral occlusions present in the additional left hepatic branch related to the Gel-Foam embolization. Vasospasm noted at the catheter site. No active bleeding demonstrated. Microcatheter was removed. A final angiogram was performed through the SOS 5 French catheter of the celiac origin. Celiac: Following left hepatic Gel-Foam embolization, there is significant reduction in the arterial vascularity to the the left hepatic lobe where the biopsy was performed. Residual vasospasm present in the proximal left hepatic artery. No other significant acute vascular process. Catheter was retracted and advanced into the SMA. SMA angiogram performed. SMA angiogram: SMA is widely patent. No variant SMA supply demonstrated to the liver. On the venous phase, the superior mesenteric vein and portal vein all remain patent. Access removed. Hemostasis obtained with manual compression of the right common femoral artery puncture site. Patient tolerated the procedure well. No immediate  complication. IMPRESSION: Successful celiac, hepatic, and SMA angiograms as above. Successful micro catheterization, angiograms, and Gel-Foam embolization of the left hepatic artery resulting in significant reduction in arterial flow to the area the liver were the percutaneous biopsy was performed. Electronically Signed   By: Jerilynn Mages.  Shick M.D.   On: 01/19/2017 10:01   Ir Angiogram Selective Each Additional Vessel  Result Date: 01/19/2017 INDICATION: Acute abdominal pain with acute perihepatic hemorrhage 2 hours status post percutaneous left hepatic metastasis biopsy. EXAM: SELECTIVE VISCERAL ARTERIOGRAPHY; IR EMBO ART VEN HEMORR LYMPH EXTRAV INC GUIDE ROADMAPPING; ADDITIONAL ARTERIOGRAPHY; IR ULTRASOUND GUIDANCE VASC ACCESS RIGHT MEDICATIONS: 1% lidocaine locally. ANESTHESIA/SEDATION: Moderate (conscious) sedation was employed during this procedure. A total of Versed 1.0 mg and Fentanyl 25 mcg was administered intravenously. Moderate Sedation Time: 50 minutes. The patient's level of consciousness and vital signs were monitored continuously by radiology nursing throughout the procedure under my direct supervision. CONTRAST:  1 ISOVUE-300 IOPAMIDOL (ISOVUE-300) INJECTION 61%, 1 ISOVUE-300 IOPAMIDOL (ISOVUE-300) INJECTION 61% FLUOROSCOPY TIME:  Fluoroscopy Time: 14 minutes 12 seconds (144 mGy). COMPLICATIONS: None immediate. PROCEDURE: Informed consent was obtained from the patient following explanation of the procedure, risks, benefits and alternatives. The patient understands, agrees and consents for the procedure. All questions were addressed. A time out was performed prior to the initiation of the procedure. Maximal barrier sterile technique utilized including caps, mask, sterile gowns, sterile gloves, large sterile drape, hand hygiene, and Betadine prep. Under sterile conditions and local anesthesia, ultrasound micropuncture access performed of the right common femoral artery. Five French sheath inserted. SOS  catheter was utilized to select the celiac origin. Celiac angiogram performed. Celiac: Celiac origin is widely patent. Splenic, common hepatic, gastroduodenal artery, proper hepatic, right and left hepatic arteries are all patent. Irregularity of the hepatic vasculature is secondary to hepatomegaly and diffuse hepatic metastases. No active bleeding demonstrated. On the venous phase, the splenic vein and portal vein are all patent. Through the [place] a 5 French catheter, a Renegade STC microcatheter was advanced over a micro glidewire into the proper hepatic artery. Proper hepatic angiogram: Proper hepatic, right and left hepatic arteries are patent. Vasospasm present in the proximal right hepatic artery related to wire manipulation. Limited assessment of the vasculature because of the contrast injection and poor opacification of the left hepatic artery. Over a double angled Glidewire, the microcatheter was advanced into the left  hepatic artery. Left hepatic angiogram performed. Left hepatic: Left hepatic artery is patent. This is the dominant supply to the left hepatic lobe lateral segment were the percutaneous biopsy was performed. Peripheral irregularity of the arterial vasculature related to hepatic metastases. No active extravasation, acute bleeding, or other acute vascular abnormality including AV fistula or pseudoaneurysm. Because of the significant recent acute perihepatic hemorrhage and a biopsy performed in the left hepatic lobe lateral segment, Gel-Foam embolization of left hepatic artery will be performed. Left hepatic artery Gel-Foam embolization: Through the microcatheter, approximately 5 cc of Gel-Foam slurry was instilled into left hepatic artery. Following embolization, there is significant reduction in the left hepatic arterial peripheral vasculature and sluggish flow but the left hepatic main artery remains patent. Catheter was retracted and advanced into a left hepatic branch supplying the medial  segment. Additional left hepatic angiogram performed. Additional left hepatic angiogram: Irregularity of the parenchymal staining related to diffuse hepatic metastases. Scattered peripheral occlusions present in the additional left hepatic branch related to the Gel-Foam embolization. Vasospasm noted at the catheter site. No active bleeding demonstrated. Microcatheter was removed. A final angiogram was performed through the SOS 5 French catheter of the celiac origin. Celiac: Following left hepatic Gel-Foam embolization, there is significant reduction in the arterial vascularity to the the left hepatic lobe where the biopsy was performed. Residual vasospasm present in the proximal left hepatic artery. No other significant acute vascular process. Catheter was retracted and advanced into the SMA. SMA angiogram performed. SMA angiogram: SMA is widely patent. No variant SMA supply demonstrated to the liver. On the venous phase, the superior mesenteric vein and portal vein all remain patent. Access removed. Hemostasis obtained with manual compression of the right common femoral artery puncture site. Patient tolerated the procedure well. No immediate complication. IMPRESSION: Successful celiac, hepatic, and SMA angiograms as above. Successful micro catheterization, angiograms, and Gel-Foam embolization of the left hepatic artery resulting in significant reduction in arterial flow to the area the liver were the percutaneous biopsy was performed. Electronically Signed   By: Jerilynn Mages.  Shick M.D.   On: 01/19/2017 10:01   US Biopsy  Result Date: 01/18/2017 INDICATION: LIVER METASTASES, UNKNOWN PRIMARY. EXAM: ULTRASOUND BIOPSY CORE LIVER MEDICATIONS: 1% LIDOCAINE ANESTHESIA/SEDATION: Moderate (conscious) sedation was employed during this procedure. A total of Versed 1.0 mg and Fentanyl 50 mcg was administered intravenously. Moderate Sedation Time: 10 minutes. The patient's level of consciousness and vital signs were monitored  continuously by radiology nursing throughout the procedure under my direct supervision. FLUOROSCOPY TIME:  Fluoroscopy Time: NONE. COMPLICATIONS: None immediate. PROCEDURE: Informed written consent was obtained from the patient after a thorough discussion of the procedural risks, benefits and alternatives. All questions were addressed. Maximal Sterile Barrier Technique was utilized including caps, mask, sterile gowns, sterile gloves, sterile drape, hand hygiene and skin antiseptic. A timeout was performed prior to the initiation of the procedure. Previous imaging reviewed. Preliminary ultrasound performed. A solid left hepatic lobe lesion was localized from a subxiphoid window. This was correlated with the abdominal MRI. Overlying skin was marked. Under sterile conditions and local anesthesia, a 17 gauge 6.8 cm access needle was advanced from an anterior subxiphoid approach into the left hepatic mass. Needle position confirmed with ultrasound. Three 1 cm 18 gauge core biopsies obtained. Samples placed in formalin. These were intact and non fragmented. Needle tract embolized with Gel-Foam. Postprocedure imaging demonstrates no hemorrhage or hematoma. Patient tolerated the biopsy well. IMPRESSION: Successful ultrasound left hepatic mass 18 gauge core biopsy Electronically Signed  By: Eugenie Filler M.D.   On: 01/18/2017 14:53   Ir US Guide Vasc Access Right  Result Date: 01/19/2017 INDICATION: Acute abdominal pain with acute perihepatic hemorrhage 2 hours status post percutaneous left hepatic metastasis biopsy. EXAM: SELECTIVE VISCERAL ARTERIOGRAPHY; IR EMBO ART VEN HEMORR LYMPH EXTRAV INC GUIDE ROADMAPPING; ADDITIONAL ARTERIOGRAPHY; IR ULTRASOUND GUIDANCE VASC ACCESS RIGHT MEDICATIONS: 1% lidocaine locally. ANESTHESIA/SEDATION: Moderate (conscious) sedation was employed during this procedure. A total of Versed 1.0 mg and Fentanyl 25 mcg was administered intravenously. Moderate Sedation Time: 50 minutes. The patient's  level of consciousness and vital signs were monitored continuously by radiology nursing throughout the procedure under my direct supervision. CONTRAST:  1 ISOVUE-300 IOPAMIDOL (ISOVUE-300) INJECTION 61%, 1 ISOVUE-300 IOPAMIDOL (ISOVUE-300) INJECTION 61% FLUOROSCOPY TIME:  Fluoroscopy Time: 14 minutes 12 seconds (144 mGy). COMPLICATIONS: None immediate. PROCEDURE: Informed consent was obtained from the patient following explanation of the procedure, risks, benefits and alternatives. The patient understands, agrees and consents for the procedure. All questions were addressed. A time out was performed prior to the initiation of the procedure. Maximal barrier sterile technique utilized including caps, mask, sterile gowns, sterile gloves, large sterile drape, hand hygiene, and Betadine prep. Under sterile conditions and local anesthesia, ultrasound micropuncture access performed of the right common femoral artery. Five French sheath inserted. SOS catheter was utilized to select the celiac origin. Celiac angiogram performed. Celiac: Celiac origin is widely patent. Splenic, common hepatic, gastroduodenal artery, proper hepatic, right and left hepatic arteries are all patent. Irregularity of the hepatic vasculature is secondary to hepatomegaly and diffuse hepatic metastases. No active bleeding demonstrated. On the venous phase, the splenic vein and portal vein are all patent. Through the [place] a 5 French catheter, a Renegade STC microcatheter was advanced over a micro glidewire into the proper hepatic artery. Proper hepatic angiogram: Proper hepatic, right and left hepatic arteries are patent. Vasospasm present in the proximal right hepatic artery related to wire manipulation. Limited assessment of the vasculature because of the contrast injection and poor opacification of the left hepatic artery. Over a double angled Glidewire, the microcatheter was advanced into the left hepatic artery. Left hepatic angiogram performed.  Left hepatic: Left hepatic artery is patent. This is the dominant supply to the left hepatic lobe lateral segment were the percutaneous biopsy was performed. Peripheral irregularity of the arterial vasculature related to hepatic metastases. No active extravasation, acute bleeding, or other acute vascular abnormality including AV fistula or pseudoaneurysm. Because of the significant recent acute perihepatic hemorrhage and a biopsy performed in the left hepatic lobe lateral segment, Gel-Foam embolization of left hepatic artery will be performed. Left hepatic artery Gel-Foam embolization: Through the microcatheter, approximately 5 cc of Gel-Foam slurry was instilled into left hepatic artery. Following embolization, there is significant reduction in the left hepatic arterial peripheral vasculature and sluggish flow but the left hepatic main artery remains patent. Catheter was retracted and advanced into a left hepatic branch supplying the medial segment. Additional left hepatic angiogram performed. Additional left hepatic angiogram: Irregularity of the parenchymal staining related to diffuse hepatic metastases. Scattered peripheral occlusions present in the additional left hepatic branch related to the Gel-Foam embolization. Vasospasm noted at the catheter site. No active bleeding demonstrated. Microcatheter was removed. A final angiogram was performed through the SOS 5 French catheter of the celiac origin. Celiac: Following left hepatic Gel-Foam embolization, there is significant reduction in the arterial vascularity to the the left hepatic lobe where the biopsy was performed. Residual vasospasm present in the proximal left  hepatic artery. No other significant acute vascular process. Catheter was retracted and advanced into the SMA. SMA angiogram performed. SMA angiogram: SMA is widely patent. No variant SMA supply demonstrated to the liver. On the venous phase, the superior mesenteric vein and portal vein all remain  patent. Access removed. Hemostasis obtained with manual compression of the right common femoral artery puncture site. Patient tolerated the procedure well. No immediate complication. IMPRESSION: Successful celiac, hepatic, and SMA angiograms as above. Successful micro catheterization, angiograms, and Gel-Foam embolization of the left hepatic artery resulting in significant reduction in arterial flow to the area the liver were the percutaneous biopsy was performed. Electronically Signed   By: Jerilynn Mages.  Shick M.D.   On: 01/19/2017 10:01   Dg Chest Port 1v Same Day  Result Date: 01/18/2017 CLINICAL DATA:  Acute onset of shortness of breath, vomiting and epigastric abdominal pain. Fever. Initial encounter. EXAM: PORTABLE CHEST 1 VIEW COMPARISON:  Chest radiograph performed 01/16/2017 FINDINGS: The lungs are hyperexpanded, with flattening of the hemidiaphragms, compatible with COPD. There is no evidence of focal opacification, pleural effusion or pneumothorax. The cardiomediastinal silhouette is within normal limits. No acute osseous abnormalities are seen. IMPRESSION: Findings of COPD.  Lungs remain otherwise clear. Electronically Signed   By: Garald Balding M.D.   On: 01/18/2017 23:48   Dg Abd Portable 1v  Result Date: 01/18/2017 CLINICAL DATA:  Acute onset of shortness of breath, vomiting and epigastric abdominal pain. Recent liver biopsy. Fever. Initial encounter. EXAM: PORTABLE ABDOMEN - 1 VIEW COMPARISON:  None. FINDINGS: The visualized bowel gas pattern is unremarkable. Scattered air and stool filled loops of colon are seen; no abnormal dilatation of small bowel loops is seen to suggest small bowel obstruction. No free intra-abdominal air is identified, though evaluation for free air is limited on a single supine view. The stomach is largely filled with air. The visualized osseous structures are within normal limits; the sacroiliac joints are unremarkable in appearance. IMPRESSION: Unremarkable bowel gas  pattern; no free intra-abdominal air seen. Moderate amount of stool noted in the colon. Electronically Signed   By: Garald Balding M.D.   On: 01/18/2017 23:50   [place] Guide Roadmapping  Result Date: 01/19/2017 INDICATION: Acute abdominal pain with acute perihepatic hemorrhage 2 hours status post percutaneous left hepatic metastasis biopsy. EXAM: SELECTIVE VISCERAL ARTERIOGRAPHY; IR EMBO ART VEN HEMORR LYMPH EXTRAV INC GUIDE ROADMAPPING; ADDITIONAL ARTERIOGRAPHY; IR ULTRASOUND GUIDANCE VASC ACCESS RIGHT MEDICATIONS: 1% lidocaine locally. ANESTHESIA/SEDATION: Moderate (conscious) sedation was employed during this procedure. A total of Versed 1.0 mg and Fentanyl 25 mcg was administered intravenously. Moderate Sedation Time: 50 minutes. The patient's level of consciousness and vital signs were monitored continuously by radiology nursing throughout the procedure under my direct supervision. CONTRAST:  1 ISOVUE-300 IOPAMIDOL (ISOVUE-300) INJECTION 61%, 1 ISOVUE-300 IOPAMIDOL (ISOVUE-300) INJECTION 61% FLUOROSCOPY TIME:  Fluoroscopy Time: 14 minutes 12 seconds (144 mGy). COMPLICATIONS: None immediate. PROCEDURE: Informed consent was obtained from the patient following explanation of the procedure, risks, benefits and alternatives. The patient understands, agrees and consents for the procedure. All questions were addressed. A time out was performed prior to the initiation of the procedure. Maximal barrier sterile technique utilized including caps, mask, sterile gowns, sterile gloves, large sterile drape, hand hygiene, and Betadine prep. Under sterile conditions and local anesthesia, ultrasound micropuncture access performed of the right common femoral artery. Five French sheath inserted. SOS catheter was utilized to select the celiac origin. Celiac angiogram performed. Celiac:  Celiac origin is widely patent. Splenic, common hepatic, gastroduodenal artery, proper hepatic, right and  left hepatic arteries are all patent. Irregularity of the hepatic vasculature is secondary to hepatomegaly and diffuse hepatic metastases. No active bleeding demonstrated. On the venous phase, the splenic vein and portal vein are all patent. Through the [place] a 5 French catheter, a Renegade STC microcatheter was advanced over a micro glidewire into the proper hepatic artery. Proper hepatic angiogram: Proper hepatic, right and left hepatic arteries are patent. Vasospasm present in the proximal right hepatic artery related to wire manipulation. Limited assessment of the vasculature because of the contrast injection and poor opacification of the left hepatic artery. Over a double angled Glidewire, the microcatheter was advanced into the left hepatic artery. Left hepatic angiogram performed. Left hepatic: Left hepatic artery is patent. This is the dominant supply to the left hepatic lobe lateral segment were the percutaneous biopsy was performed. Peripheral irregularity of the arterial vasculature related to hepatic metastases. No active extravasation, acute bleeding, or other acute vascular abnormality including AV fistula or pseudoaneurysm. Because of the significant recent acute perihepatic hemorrhage and a biopsy performed in the left hepatic lobe lateral segment, Gel-Foam embolization of left hepatic artery will be performed. Left hepatic artery Gel-Foam embolization: Through the microcatheter, approximately 5 cc of Gel-Foam slurry was instilled into left hepatic artery. Following embolization, there is significant reduction in the left hepatic arterial peripheral vasculature and sluggish flow but the left hepatic main artery remains patent. Catheter was retracted and advanced into a left hepatic branch supplying the medial segment. Additional left hepatic angiogram performed. Additional left hepatic angiogram: Irregularity of the parenchymal staining related to diffuse hepatic metastases. Scattered peripheral  occlusions present in the additional left hepatic branch related to the Gel-Foam embolization. Vasospasm noted at the catheter site. No active bleeding demonstrated. Microcatheter was removed. A final angiogram was performed through the SOS 5 French catheter of the celiac origin. Celiac: Following left hepatic Gel-Foam embolization, there is significant reduction in the arterial vascularity to the the left hepatic lobe where the biopsy was performed. Residual vasospasm present in the proximal left hepatic artery. No other significant acute vascular process. Catheter was retracted and advanced into the SMA. SMA angiogram performed. SMA angiogram: SMA is widely patent. No variant SMA supply demonstrated to the liver. On the venous phase, the superior mesenteric vein and portal vein all remain patent. Access removed. Hemostasis obtained with manual compression of the right common femoral artery puncture site. Patient tolerated the procedure well. No immediate complication. IMPRESSION: Successful celiac, hepatic, and SMA angiograms as above. Successful micro catheterization, angiograms, and Gel-Foam embolization of the left hepatic artery resulting in significant reduction in arterial flow to the area the liver were the percutaneous biopsy was performed. Electronically Signed   By: Jerilynn Mages.  Shick M.D.   On: 01/19/2017 10:01   Ct Angio Abd/pel W/ And/or W/o  Result Date: 01/20/2017 CLINICAL DATA:  Hepatic metastases, status post ultrasound percutaneous biopsy which was complicated by acute bleeding 2 hours after the biopsy. Patient subsequently underwent Gel-Foam embolization of left hepatic artery. Despite transfusions, further decrease in hemoglobin this morning. EXAM: CT ANGIOGRAPHY ABDOMEN AND PELVIS TECHNIQUE: Multidetector CT imaging of the abdomen and pelvis was performed using the standard protocol during bolus administration of intravenous contrast. Multiplanar reconstructed images including MIPs were obtained  and reviewed to evaluate the vascular anatomy. CONTRAST:  100 cc Isovue COMPARISON:  01/18/2017 FINDINGS: Arterial findings: Aorta: Scattered moderate atherosclerosis of the aorta, most pronounced in  the infrarenal aorta without occlusion, aneurysm or dissection. No retroperitoneal hemorrhage. Celiac axis: Atherosclerotic origin but remains patent. Splenic, left gastric, and hepatic branches appear patent. No active bleeding or acute vascular process. Superior mesenteric: Atherosclerotic origin but remains patent. SMA branches appear patent. No active bleeding. Left renal:           Mild atherosclerosis but widely patent. Right renal:          Mild atherosclerosis but widely patent. Inferior mesenteric:  Atherosclerotic origin but remains patent. Left iliac: Iliac atherosclerosis noted without occlusion. Left common, internal and external iliac arteries are patent. Right iliac: Similar atherosclerosis without occlusion. Right common, internal and external iliac arteries are patent. Venous findings: Hepatic veins, portal, splenic, mesenteric veins are patent. No veno-occlusive process. IVC, renal veins common iliac veins are patent. Review of the MIP images confirms the above findings. Nonvascular findings: Basilar emphysema noted. Minor atelectasis. Normal heart size. Trace pleural effusions. No pericardial effusion. Hypodense peripherally enhancing hepatic masses again evident compatible with metastases. No evidence of active bleeding or acute vascular process within the liver. No biliary dilatation. Gallbladder is collapsed but contains partially calcified gallstones. Moderate Heterogeneous perihepatic free fluid, compatible with a mixture of ascites and known hemoperitoneum related to the recent left hepatic mass biopsy. Spleen is unremarkable. Small amount of hemoperitoneum about the spleen upper pole. Adrenal glands, kidneys, ureters, and bladder demonstrate no acute process, obstruction or hydronephrosis.  Negative for bowel obstruction, significant dilatation, ileus, or free air. No adenopathy Remote hysterectomy.  Pelvic ascites also evident. No inguinal abnormality or hernia. Intact abdominal wall. No rectus sheath hemorrhage or hematoma over the left hepatic biopsy site. Degenerative changes of the spine. Bones are mildly osteopenic. No acute osseous finding. IMPRESSION: Negative for acute or active intra-abdominal bleeding. Stable diffuse hepatic metastatic process. Stable perihepatic ascites and hemoperitoneum related to the recent left hepatic mass biopsy. Intact abdominal wall without rectus sheath hematoma or hemorrhage. Aortoiliac atherosclerosis Cholelithiasis Pelvic ascites as well These results were called by telephone at the time of interpretation on 01/20/2017 at 2:13 pm to Dr. Florencia Reasons , who verbally acknowledged these results. Electronically Signed   By: Jerilynn Mages.  Shick M.D.   On: 01/20/2017 14:15   US Abdomen Limited Ruq  Result Date: 01/18/2017 CLINICAL DATA:  61 year old female with a history of multiple liver masses, status post percutaneous biopsy today's date. Interval development of abdominal pain EXAM: US ABDOMEN LIMITED - RIGHT UPPER QUADRANT COMPARISON:  Ultrasound 01/18/2017, MRI 01/15/2017 FINDINGS: Gallbladder: Contracted gallbladder with echogenic focus in the lumen compatible with cholelithiasis. Common bile duct: Diameter: 4 mm -5 mm Liver: Re- demonstration of mass within right liver lobe 7 point far cm by 5.4 cm x 7.4 cm. The biopsy of the left liver lobe on the comparison ultrasound today's date demonstrates small echogenic focus, compatible with hemostatic material. There is hyperechoic free fluid adjacent to the liver margin, both anterior and posterior to the left liver lobe. IMPRESSION: New hyperechoic free fluid adjacent to the left liver lobe, status post percutaneous biopsy, compatible with hemorrhage. Re- demonstration of liver masses, better characterized on prior MRI. These  results were called by telephone at the time of interpretation on 01/18/2017 at 5:42 pm to Dr. Annamaria Boots who verbally acknowledged these results. Electronically Signed   By: Corrie Mckusick D.O.   On: 01/18/2017 17:43   US Abdomen Limited Ruq  Result Date: 01/11/2017 CLINICAL DATA:  Right upper quadrant pain . EXAM: US ABDOMEN LIMITED - RIGHT UPPER QUADRANT  COMPARISON:  No recent prior . FINDINGS: Gallbladder: Multiple gallstones are noted. Gallstone stones measure up to 1.1 cm. Gallbladder wall is thickened at 1.1 cm. Cholecystitis cannot be excluded. Negative Murphy sign. Common bile duct: Diameter: 2.0 mm Liver: Multiple solid hepatic lesions are noted. Largest lesion measures 9.7 cm and is in the right hepatic lobe. These findings are highly suspicious for metastatic disease. A pancreatic head mass cannot be excluded. Appear pancreatic adenopathy cannot be excluded. Portal vein is narrowed most likely from periportal adenopathy. Portal vein however is patent. IMPRESSION: 1. Multiple gallstones. Gallbladder wall is thickened at 1.1 cm. Cholecystitis cannot be excluded. 2. Multiple solid hepatic lesions. The largest measures 9.7 cm. Pancreatic mass appears to be present. Pancreatic solid lesions noted most likely adenopathy. Portal vein is narrowed possibly from periportal adenopathy. Portal vein however is patent. These findings together suggest malignancy with metastatic disease. Further evaluation with gadolinium-enhanced MRI of the abdomen should be considered. Electronically Signed   By: Marcello Moores  Register   On: 01/11/2017 12:19      TODAY-DAY OF DISCHARGE:  Subjective:   Rachel Butler today has no headache,no chest abdominal pain,no new weakness tingling or numbness, feels much better wants to go home today.  Objective:   Blood pressure (!) 158/72, pulse 94, temperature 99.2 F (37.3 C), temperature source Oral, resp. rate 15, height 5\' 3"  (1.6 m), weight 46.6 kg (102 lb 11.2 oz), SpO2 100  %.  Intake/Output Summary (Last 24 hours) at 01/24/17 1205 Last data filed at 01/24/17 0913  Gross per 24 hour  Intake              940 ml  Output             1100 ml  Net             -160 ml   Filed Weights   01/22/17 0241 01/23/17 0444 01/24/17 0503  Weight: 47.6 kg (104 lb 14.4 oz) 46.9 kg (103 lb 6.4 oz) 46.6 kg (102 lb 11.2 oz)    Exam: Awake Alert, Oriented *3, No new F.N deficits, Normal affect [place].AT,PERRAL Supple Neck,No JVD, No cervical lymphadenopathy appriciated.  Symmetrical Chest wall movement, Good air movement bilaterally, CTAB RRR,No Gallops,Rubs or new Murmurs, No Parasternal Heave +ve B.Sounds, Abd Soft, Non tender, No organomegaly appriciated, No rebound -guarding or rigidity. No Cyanosis, Clubbing or edema, No new Rash or bruise   PERTINENT RADIOLOGIC STUDIES: Dg Chest 2 View  Result Date: 01/16/2017 CLINICAL DATA:  Liver metastases.  Tobacco use. EXAM: CHEST  2 VIEW COMPARISON:  August 25, 2015 FINDINGS: There are nipple shadows bilaterally. There is no evident edema or consolidation. No pulmonary nodular lesion is demonstrable by radiography. Heart size and pulmonary vascularity are normal. No adenopathy. There is atherosclerotic calcification in the aorta. No blastic or lytic bone lesions. IMPRESSION: Aortic atherosclerosis. No edema or consolidation. No evident mass or adenopathy. Electronically Signed   By: Lowella Grip III M.D.   On: 01/16/2017 14:34   Ct Angio Chest Pe W Or Wo Contrast  Addendum Date: 01/23/2017   ADDENDUM REPORT: 01/23/2017 14:00 ADDENDUM: Voice recognition error: The third sentence of the Findings section should read "There is no focal filling defect to suggest pulmonary embolus." Electronically Signed   By: San Morelle M.D.   On: 01/23/2017 14:00   Result Date: 01/23/2017 CLINICAL DATA:  Hypoxia. EXAM: CT ANGIOGRAPHY CHEST WITH CONTRAST TECHNIQUE: Multidetector CT imaging of the chest was performed using the standard  protocol during  bolus administration of intravenous contrast. Multiplanar CT image reconstructions and MIPs were obtained to evaluate the vascular anatomy. CONTRAST:  80 mL Isovue 370 COMPARISON:  One-view chest x-ray 01/18/2017. CT of the abdomen and pelvis 01/20/2017 FINDINGS: Cardiovascular: The heart size is normal. The pulmonary arteries demonstrate excellent opacification. There is focal filling defect to suggest pulmonary embolus. Pulmonary arterial size is normal. The aorta demonstrates atherosclerotic change without aneurysm. A 4 vessel arch configuration is present. The left vertebral artery Mediastinum/Nodes: No significant mediastinal or axillary adenopathy is present. The esophagus is unremarkable. Secretions versus small tracheal nodules are evident. Lungs/Pleura: Centrilobular emphysema is present. No focal nodule or mass lesion is present. Mild dependent atelectasis is present bilaterally. There are small bilateral pleural effusions. Upper Abdomen: Liver metastases are again noted. Musculoskeletal: No focal lytic or blastic lesions are present. Vertebral body heights alignment are maintained. Review of the MIP images confirms the above findings. IMPRESSION: 1. No pulmonary embolus. 2. Centrilobular emphysema. 3. Secretions versus small tracheal nodules. Short-term follow-up CT scan of or bronchoscopy would be useful for further evaluation. 4. Aortic atherosclerosis. 5. Liver metastases. Electronically Signed: By: San Morelle M.D. On: 01/22/2017 09:53   Mr Abdomen Wwo Contrast  Result Date: 01/15/2017 CLINICAL DATA:  Evaluate liver lesions noted on ultrasound. EXAM: MRI ABDOMEN WITHOUT AND WITH CONTRAST TECHNIQUE: Multiplanar multisequence MR imaging of the abdomen was performed both before and after the administration of intravenous contrast. CONTRAST:  15mL MULTIHANCE GADOBENATE DIMEGLUMINE 529 MG/ML IV SOLN COMPARISON:  None. FINDINGS: Lower chest: No acute findings. Hepatobiliary: The  liver is enlarged containing multiple ill defined heterogeneous and peripheral enhancing lesions worrisome for multifocal metastatic disease. The largest lesion is in the medial segment of left lobe measuring 6 cm, image number 26 of series 6. Lesion within segment 8 measures 3.2 cm, image number 8 of series 6. Segment 5 lesion measures 3.1 cm, image 31 of series 6. The gallbladder fundus extends into the large mass within the medial segment of left lobe, image number 65 of series 1401. No biliary dilatation. Pancreas:  No inflammation or mass. Spleen:  Within normal limits in size and appearance. Adrenals/Urinary Tract: No masses identified. No evidence of hydronephrosis. Stomach/Bowel: The stomach is normal. The small bowel loops have a normal course and caliber. Visualized portions of the colon are unremarkable. Vascular/Lymphatic: Normal appearance of the abdominal aorta. Aortic atherosclerosis noted. Periaortic node is prominent measuring 8 mm, image number 50 of series 1401. Necrotic appearing portacaval node measures 1.5 cm, image 55 of series 1405. Porta hepatis node measures 1.3 cm, image 49 of series 1404 and has mass effect upon the extrahepatic portal vein. Other:  None. Musculoskeletal: No suspicious bone lesions identified. IMPRESSION: 1. Examination is positive for multifocal heterogeneous enhancing liver lesions worrisome for diffuse hepatic metastasis. Correlation with tissue sampling is advised. The dominant mass appears contiguous with the gallbladder fundus. Cannot rule out gallbladder carcinoma or other GI primary. 2. Enlarged portacaval and porta hepatic lymph nodes worrisome for metastatic adenopathy. Electronically Signed   By: Kerby Moors M.D.   On: 01/15/2017 16:09   Ir Angiogram Visceral Selective  Result Date: 01/19/2017 INDICATION: Acute abdominal pain with acute perihepatic hemorrhage 2 hours status post percutaneous left hepatic metastasis biopsy. EXAM: SELECTIVE VISCERAL  ARTERIOGRAPHY; IR EMBO ART VEN HEMORR LYMPH EXTRAV INC GUIDE ROADMAPPING; ADDITIONAL ARTERIOGRAPHY; IR ULTRASOUND GUIDANCE VASC ACCESS RIGHT MEDICATIONS: 1% lidocaine locally. ANESTHESIA/SEDATION: Moderate (conscious) sedation was employed during this procedure. A total of Versed 1.0 mg and Fentanyl  25 mcg was administered intravenously. Moderate Sedation Time: 50 minutes. The patient's level of consciousness and vital signs were monitored continuously by radiology nursing throughout the procedure under my direct supervision. CONTRAST:  1 ISOVUE-300 IOPAMIDOL (ISOVUE-300) INJECTION 61%, 1 ISOVUE-300 IOPAMIDOL (ISOVUE-300) INJECTION 61% FLUOROSCOPY TIME:  Fluoroscopy Time: 14 minutes 12 seconds (144 mGy). COMPLICATIONS: None immediate. PROCEDURE: Informed consent was obtained from the patient following explanation of the procedure, risks, benefits and alternatives. The patient understands, agrees and consents for the procedure. All questions were addressed. A time out was performed prior to the initiation of the procedure. Maximal barrier sterile technique utilized including caps, mask, sterile gowns, sterile gloves, large sterile drape, hand hygiene, and Betadine prep. Under sterile conditions and local anesthesia, ultrasound micropuncture access performed of the right common femoral artery. Five French sheath inserted. SOS catheter was utilized to select the celiac origin. Celiac angiogram performed. Celiac: Celiac origin is widely patent. Splenic, common hepatic, gastroduodenal artery, proper hepatic, right and left hepatic arteries are all patent. Irregularity of the hepatic vasculature is secondary to hepatomegaly and diffuse hepatic metastases. No active bleeding demonstrated. On the venous phase, the splenic vein and portal vein are all patent. Through the [place] a 5 French catheter, a Renegade STC microcatheter was advanced over a micro glidewire into the proper hepatic artery. Proper hepatic angiogram: Proper  hepatic, right and left hepatic arteries are patent. Vasospasm present in the proximal right hepatic artery related to wire manipulation. Limited assessment of the vasculature because of the contrast injection and poor opacification of the left hepatic artery. Over a double angled Glidewire, the microcatheter was advanced into the left hepatic artery. Left hepatic angiogram performed. Left hepatic: Left hepatic artery is patent. This is the dominant supply to the left hepatic lobe lateral segment were the percutaneous biopsy was performed. Peripheral irregularity of the arterial vasculature related to hepatic metastases. No active extravasation, acute bleeding, or other acute vascular abnormality including AV fistula or pseudoaneurysm. Because of the significant recent acute perihepatic hemorrhage and a biopsy performed in the left hepatic lobe lateral segment, Gel-Foam embolization of left hepatic artery will be performed. Left hepatic artery Gel-Foam embolization: Through the microcatheter, approximately 5 cc of Gel-Foam slurry was instilled into left hepatic artery. Following embolization, there is significant reduction in the left hepatic arterial peripheral vasculature and sluggish flow but the left hepatic main artery remains patent. Catheter was retracted and advanced into a left hepatic branch supplying the medial segment. Additional left hepatic angiogram performed. Additional left hepatic angiogram: Irregularity of the parenchymal staining related to diffuse hepatic metastases. Scattered peripheral occlusions present in the additional left hepatic branch related to the Gel-Foam embolization. Vasospasm noted at the catheter site. No active bleeding demonstrated. Microcatheter was removed. A final angiogram was performed through the SOS 5 French catheter of the celiac origin. Celiac: Following left hepatic Gel-Foam embolization, there is significant reduction in the arterial vascularity to the the left  hepatic lobe where the biopsy was performed. Residual vasospasm present in the proximal left hepatic artery. No other significant acute vascular process. Catheter was retracted and advanced into the SMA. SMA angiogram performed. SMA angiogram: SMA is widely patent. No variant SMA supply demonstrated to the liver. On the venous phase, the superior mesenteric vein and portal vein all remain patent. Access removed. Hemostasis obtained with manual compression of the right common femoral artery puncture site. Patient tolerated the procedure well. No immediate complication. IMPRESSION: Successful celiac, hepatic, and SMA angiograms as above. Successful micro catheterization,  angiograms, and Gel-Foam embolization of the left hepatic artery resulting in significant reduction in arterial flow to the area the liver were the percutaneous biopsy was performed. Electronically Signed   By: Jerilynn Mages.  Shick M.D.   On: 01/19/2017 10:01   Ir Angiogram Visceral Selective  Result Date: 01/19/2017 INDICATION: Acute abdominal pain with acute perihepatic hemorrhage 2 hours status post percutaneous left hepatic metastasis biopsy. EXAM: SELECTIVE VISCERAL ARTERIOGRAPHY; IR EMBO ART VEN HEMORR LYMPH EXTRAV INC GUIDE ROADMAPPING; ADDITIONAL ARTERIOGRAPHY; IR ULTRASOUND GUIDANCE VASC ACCESS RIGHT MEDICATIONS: 1% lidocaine locally. ANESTHESIA/SEDATION: Moderate (conscious) sedation was employed during this procedure. A total of Versed 1.0 mg and Fentanyl 25 mcg was administered intravenously. Moderate Sedation Time: 50 minutes. The patient's level of consciousness and vital signs were monitored continuously by radiology nursing throughout the procedure under my direct supervision. CONTRAST:  1 ISOVUE-300 IOPAMIDOL (ISOVUE-300) INJECTION 61%, 1 ISOVUE-300 IOPAMIDOL (ISOVUE-300) INJECTION 61% FLUOROSCOPY TIME:  Fluoroscopy Time: 14 minutes 12 seconds (144 mGy). COMPLICATIONS: None immediate. PROCEDURE: Informed consent was obtained from the patient  following explanation of the procedure, risks, benefits and alternatives. The patient understands, agrees and consents for the procedure. All questions were addressed. A time out was performed prior to the initiation of the procedure. Maximal barrier sterile technique utilized including caps, mask, sterile gowns, sterile gloves, large sterile drape, hand hygiene, and Betadine prep. Under sterile conditions and local anesthesia, ultrasound micropuncture access performed of the right common femoral artery. Five French sheath inserted. SOS catheter was utilized to select the celiac origin. Celiac angiogram performed. Celiac: Celiac origin is widely patent. Splenic, common hepatic, gastroduodenal artery, proper hepatic, right and left hepatic arteries are all patent. Irregularity of the hepatic vasculature is secondary to hepatomegaly and diffuse hepatic metastases. No active bleeding demonstrated. On the venous phase, the splenic vein and portal vein are all patent. Through the [place] a 5 French catheter, a Renegade STC microcatheter was advanced over a micro glidewire into the proper hepatic artery. Proper hepatic angiogram: Proper hepatic, right and left hepatic arteries are patent. Vasospasm present in the proximal right hepatic artery related to wire manipulation. Limited assessment of the vasculature because of the contrast injection and poor opacification of the left hepatic artery. Over a double angled Glidewire, the microcatheter was advanced into the left hepatic artery. Left hepatic angiogram performed. Left hepatic: Left hepatic artery is patent. This is the dominant supply to the left hepatic lobe lateral segment were the percutaneous biopsy was performed. Peripheral irregularity of the arterial vasculature related to hepatic metastases. No active extravasation, acute bleeding, or other acute vascular abnormality including AV fistula or pseudoaneurysm. Because of the significant recent acute perihepatic  hemorrhage and a biopsy performed in the left hepatic lobe lateral segment, Gel-Foam embolization of left hepatic artery will be performed. Left hepatic artery Gel-Foam embolization: Through the microcatheter, approximately 5 cc of Gel-Foam slurry was instilled into left hepatic artery. Following embolization, there is significant reduction in the left hepatic arterial peripheral vasculature and sluggish flow but the left hepatic main artery remains patent. Catheter was retracted and advanced into a left hepatic branch supplying the medial segment. Additional left hepatic angiogram performed. Additional left hepatic angiogram: Irregularity of the parenchymal staining related to diffuse hepatic metastases. Scattered peripheral occlusions present in the additional left hepatic branch related to the Gel-Foam embolization. Vasospasm noted at the catheter site. No active bleeding demonstrated. Microcatheter was removed. A final angiogram was performed through the SOS 5 French catheter of the celiac origin. Celiac: Following left  hepatic Gel-Foam embolization, there is significant reduction in the arterial vascularity to the the left hepatic lobe where the biopsy was performed. Residual vasospasm present in the proximal left hepatic artery. No other significant acute vascular process. Catheter was retracted and advanced into the SMA. SMA angiogram performed. SMA angiogram: SMA is widely patent. No variant SMA supply demonstrated to the liver. On the venous phase, the superior mesenteric vein and portal vein all remain patent. Access removed. Hemostasis obtained with manual compression of the right common femoral artery puncture site. Patient tolerated the procedure well. No immediate complication. IMPRESSION: Successful celiac, hepatic, and SMA angiograms as above. Successful micro catheterization, angiograms, and Gel-Foam embolization of the left hepatic artery resulting in significant reduction in arterial flow to the  area the liver were the percutaneous biopsy was performed. Electronically Signed   By: Jerilynn Mages.  Shick M.D.   On: 01/19/2017 10:01   Ir Angiogram Selective Each Additional Vessel  Result Date: 01/19/2017 INDICATION: Acute abdominal pain with acute perihepatic hemorrhage 2 hours status post percutaneous left hepatic metastasis biopsy. EXAM: SELECTIVE VISCERAL ARTERIOGRAPHY; IR EMBO ART VEN HEMORR LYMPH EXTRAV INC GUIDE ROADMAPPING; ADDITIONAL ARTERIOGRAPHY; IR ULTRASOUND GUIDANCE VASC ACCESS RIGHT MEDICATIONS: 1% lidocaine locally. ANESTHESIA/SEDATION: Moderate (conscious) sedation was employed during this procedure. A total of Versed 1.0 mg and Fentanyl 25 mcg was administered intravenously. Moderate Sedation Time: 50 minutes. The patient's level of consciousness and vital signs were monitored continuously by radiology nursing throughout the procedure under my direct supervision. CONTRAST:  1 ISOVUE-300 IOPAMIDOL (ISOVUE-300) INJECTION 61%, 1 ISOVUE-300 IOPAMIDOL (ISOVUE-300) INJECTION 61% FLUOROSCOPY TIME:  Fluoroscopy Time: 14 minutes 12 seconds (144 mGy). COMPLICATIONS: None immediate. PROCEDURE: Informed consent was obtained from the patient following explanation of the procedure, risks, benefits and alternatives. The patient understands, agrees and consents for the procedure. All questions were addressed. A time out was performed prior to the initiation of the procedure. Maximal barrier sterile technique utilized including caps, mask, sterile gowns, sterile gloves, large sterile drape, hand hygiene, and Betadine prep. Under sterile conditions and local anesthesia, ultrasound micropuncture access performed of the right common femoral artery. Five French sheath inserted. SOS catheter was utilized to select the celiac origin. Celiac angiogram performed. Celiac: Celiac origin is widely patent. Splenic, common hepatic, gastroduodenal artery, proper hepatic, right and left hepatic arteries are all patent. Irregularity  of the hepatic vasculature is secondary to hepatomegaly and diffuse hepatic metastases. No active bleeding demonstrated. On the venous phase, the splenic vein and portal vein are all patent. Through the [place] a 5 French catheter, a Renegade STC microcatheter was advanced over a micro glidewire into the proper hepatic artery. Proper hepatic angiogram: Proper hepatic, right and left hepatic arteries are patent. Vasospasm present in the proximal right hepatic artery related to wire manipulation. Limited assessment of the vasculature because of the contrast injection and poor opacification of the left hepatic artery. Over a double angled Glidewire, the microcatheter was advanced into the left hepatic artery. Left hepatic angiogram performed. Left hepatic: Left hepatic artery is patent. This is the dominant supply to the left hepatic lobe lateral segment were the percutaneous biopsy was performed. Peripheral irregularity of the arterial vasculature related to hepatic metastases. No active extravasation, acute bleeding, or other acute vascular abnormality including AV fistula or pseudoaneurysm. Because of the significant recent acute perihepatic hemorrhage and a biopsy performed in the left hepatic lobe lateral segment, Gel-Foam embolization of left hepatic artery will be performed. Left hepatic artery Gel-Foam embolization: Through the microcatheter, approximately 5  cc of Gel-Foam slurry was instilled into left hepatic artery. Following embolization, there is significant reduction in the left hepatic arterial peripheral vasculature and sluggish flow but the left hepatic main artery remains patent. Catheter was retracted and advanced into a left hepatic branch supplying the medial segment. Additional left hepatic angiogram performed. Additional left hepatic angiogram: Irregularity of the parenchymal staining related to diffuse hepatic metastases. Scattered peripheral occlusions present in the additional left hepatic branch  related to the Gel-Foam embolization. Vasospasm noted at the catheter site. No active bleeding demonstrated. Microcatheter was removed. A final angiogram was performed through the SOS 5 French catheter of the celiac origin. Celiac: Following left hepatic Gel-Foam embolization, there is significant reduction in the arterial vascularity to the the left hepatic lobe where the biopsy was performed. Residual vasospasm present in the proximal left hepatic artery. No other significant acute vascular process. Catheter was retracted and advanced into the SMA. SMA angiogram performed. SMA angiogram: SMA is widely patent. No variant SMA supply demonstrated to the liver. On the venous phase, the superior mesenteric vein and portal vein all remain patent. Access removed. Hemostasis obtained with manual compression of the right common femoral artery puncture site. Patient tolerated the procedure well. No immediate complication. IMPRESSION: Successful celiac, hepatic, and SMA angiograms as above. Successful micro catheterization, angiograms, and Gel-Foam embolization of the left hepatic artery resulting in significant reduction in arterial flow to the area the liver were the percutaneous biopsy was performed. Electronically Signed   By: Jerilynn Mages.  Shick M.D.   On: 01/19/2017 10:01   Ir Angiogram Selective Each Additional Vessel  Result Date: 01/19/2017 INDICATION: Acute abdominal pain with acute perihepatic hemorrhage 2 hours status post percutaneous left hepatic metastasis biopsy. EXAM: SELECTIVE VISCERAL ARTERIOGRAPHY; IR EMBO ART VEN HEMORR LYMPH EXTRAV INC GUIDE ROADMAPPING; ADDITIONAL ARTERIOGRAPHY; IR ULTRASOUND GUIDANCE VASC ACCESS RIGHT MEDICATIONS: 1% lidocaine locally. ANESTHESIA/SEDATION: Moderate (conscious) sedation was employed during this procedure. A total of Versed 1.0 mg and Fentanyl 25 mcg was administered intravenously. Moderate Sedation Time: 50 minutes. The patient's level of consciousness and vital signs were  monitored continuously by radiology nursing throughout the procedure under my direct supervision. CONTRAST:  1 ISOVUE-300 IOPAMIDOL (ISOVUE-300) INJECTION 61%, 1 ISOVUE-300 IOPAMIDOL (ISOVUE-300) INJECTION 61% FLUOROSCOPY TIME:  Fluoroscopy Time: 14 minutes 12 seconds (144 mGy). COMPLICATIONS: None immediate. PROCEDURE: Informed consent was obtained from the patient following explanation of the procedure, risks, benefits and alternatives. The patient understands, agrees and consents for the procedure. All questions were addressed. A time out was performed prior to the initiation of the procedure. Maximal barrier sterile technique utilized including caps, mask, sterile gowns, sterile gloves, large sterile drape, hand hygiene, and Betadine prep. Under sterile conditions and local anesthesia, ultrasound micropuncture access performed of the right common femoral artery. Five French sheath inserted. SOS catheter was utilized to select the celiac origin. Celiac angiogram performed. Celiac: Celiac origin is widely patent. Splenic, common hepatic, gastroduodenal artery, proper hepatic, right and left hepatic arteries are all patent. Irregularity of the hepatic vasculature is secondary to hepatomegaly and diffuse hepatic metastases. No active bleeding demonstrated. On the venous phase, the splenic vein and portal vein are all patent. Through the [place] a 5 French catheter, a Renegade STC microcatheter was advanced over a micro glidewire into the proper hepatic artery. Proper hepatic angiogram: Proper hepatic, right and left hepatic arteries are patent. Vasospasm present in the proximal right hepatic artery related to wire manipulation. Limited assessment of the vasculature because of the contrast injection and  poor opacification of the left hepatic artery. Over a double angled Glidewire, the microcatheter was advanced into the left hepatic artery. Left hepatic angiogram performed. Left hepatic: Left hepatic artery is patent.  This is the dominant supply to the left hepatic lobe lateral segment were the percutaneous biopsy was performed. Peripheral irregularity of the arterial vasculature related to hepatic metastases. No active extravasation, acute bleeding, or other acute vascular abnormality including AV fistula or pseudoaneurysm. Because of the significant recent acute perihepatic hemorrhage and a biopsy performed in the left hepatic lobe lateral segment, Gel-Foam embolization of left hepatic artery will be performed. Left hepatic artery Gel-Foam embolization: Through the microcatheter, approximately 5 cc of Gel-Foam slurry was instilled into left hepatic artery. Following embolization, there is significant reduction in the left hepatic arterial peripheral vasculature and sluggish flow but the left hepatic main artery remains patent. Catheter was retracted and advanced into a left hepatic branch supplying the medial segment. Additional left hepatic angiogram performed. Additional left hepatic angiogram: Irregularity of the parenchymal staining related to diffuse hepatic metastases. Scattered peripheral occlusions present in the additional left hepatic branch related to the Gel-Foam embolization. Vasospasm noted at the catheter site. No active bleeding demonstrated. Microcatheter was removed. A final angiogram was performed through the SOS 5 French catheter of the celiac origin. Celiac: Following left hepatic Gel-Foam embolization, there is significant reduction in the arterial vascularity to the the left hepatic lobe where the biopsy was performed. Residual vasospasm present in the proximal left hepatic artery. No other significant acute vascular process. Catheter was retracted and advanced into the SMA. SMA angiogram performed. SMA angiogram: SMA is widely patent. No variant SMA supply demonstrated to the liver. On the venous phase, the superior mesenteric vein and portal vein all remain patent. Access removed. Hemostasis obtained  with manual compression of the right common femoral artery puncture site. Patient tolerated the procedure well. No immediate complication. IMPRESSION: Successful celiac, hepatic, and SMA angiograms as above. Successful micro catheterization, angiograms, and Gel-Foam embolization of the left hepatic artery resulting in significant reduction in arterial flow to the area the liver were the percutaneous biopsy was performed. Electronically Signed   By: Jerilynn Mages.  Shick M.D.   On: 01/19/2017 10:01   Ir Angiogram Selective Each Additional Vessel  Result Date: 01/19/2017 INDICATION: Acute abdominal pain with acute perihepatic hemorrhage 2 hours status post percutaneous left hepatic metastasis biopsy. EXAM: SELECTIVE VISCERAL ARTERIOGRAPHY; IR EMBO ART VEN HEMORR LYMPH EXTRAV INC GUIDE ROADMAPPING; ADDITIONAL ARTERIOGRAPHY; IR ULTRASOUND GUIDANCE VASC ACCESS RIGHT MEDICATIONS: 1% lidocaine locally. ANESTHESIA/SEDATION: Moderate (conscious) sedation was employed during this procedure. A total of Versed 1.0 mg and Fentanyl 25 mcg was administered intravenously. Moderate Sedation Time: 50 minutes. The patient's level of consciousness and vital signs were monitored continuously by radiology nursing throughout the procedure under my direct supervision. CONTRAST:  1 ISOVUE-300 IOPAMIDOL (ISOVUE-300) INJECTION 61%, 1 ISOVUE-300 IOPAMIDOL (ISOVUE-300) INJECTION 61% FLUOROSCOPY TIME:  Fluoroscopy Time: 14 minutes 12 seconds (144 mGy). COMPLICATIONS: None immediate. PROCEDURE: Informed consent was obtained from the patient following explanation of the procedure, risks, benefits and alternatives. The patient understands, agrees and consents for the procedure. All questions were addressed. A time out was performed prior to the initiation of the procedure. Maximal barrier sterile technique utilized including caps, mask, sterile gowns, sterile gloves, large sterile drape, hand hygiene, and Betadine prep. Under sterile conditions and local  anesthesia, ultrasound micropuncture access performed of the right common femoral artery. Five French sheath inserted. SOS catheter was utilized to select  the celiac origin. Celiac angiogram performed. Celiac: Celiac origin is widely patent. Splenic, common hepatic, gastroduodenal artery, proper hepatic, right and left hepatic arteries are all patent. Irregularity of the hepatic vasculature is secondary to hepatomegaly and diffuse hepatic metastases. No active bleeding demonstrated. On the venous phase, the splenic vein and portal vein are all patent. Through the [place] a 5 French catheter, a Renegade STC microcatheter was advanced over a micro glidewire into the proper hepatic artery. Proper hepatic angiogram: Proper hepatic, right and left hepatic arteries are patent. Vasospasm present in the proximal right hepatic artery related to wire manipulation. Limited assessment of the vasculature because of the contrast injection and poor opacification of the left hepatic artery. Over a double angled Glidewire, the microcatheter was advanced into the left hepatic artery. Left hepatic angiogram performed. Left hepatic: Left hepatic artery is patent. This is the dominant supply to the left hepatic lobe lateral segment were the percutaneous biopsy was performed. Peripheral irregularity of the arterial vasculature related to hepatic metastases. No active extravasation, acute bleeding, or other acute vascular abnormality including AV fistula or pseudoaneurysm. Because of the significant recent acute perihepatic hemorrhage and a biopsy performed in the left hepatic lobe lateral segment, Gel-Foam embolization of left hepatic artery will be performed. Left hepatic artery Gel-Foam embolization: Through the microcatheter, approximately 5 cc of Gel-Foam slurry was instilled into left hepatic artery. Following embolization, there is significant reduction in the left hepatic arterial peripheral vasculature and sluggish flow but the left  hepatic main artery remains patent. Catheter was retracted and advanced into a left hepatic branch supplying the medial segment. Additional left hepatic angiogram performed. Additional left hepatic angiogram: Irregularity of the parenchymal staining related to diffuse hepatic metastases. Scattered peripheral occlusions present in the additional left hepatic branch related to the Gel-Foam embolization. Vasospasm noted at the catheter site. No active bleeding demonstrated. Microcatheter was removed. A final angiogram was performed through the SOS 5 French catheter of the celiac origin. Celiac: Following left hepatic Gel-Foam embolization, there is significant reduction in the arterial vascularity to the the left hepatic lobe where the biopsy was performed. Residual vasospasm present in the proximal left hepatic artery. No other significant acute vascular process. Catheter was retracted and advanced into the SMA. SMA angiogram performed. SMA angiogram: SMA is widely patent. No variant SMA supply demonstrated to the liver. On the venous phase, the superior mesenteric vein and portal vein all remain patent. Access removed. Hemostasis obtained with manual compression of the right common femoral artery puncture site. Patient tolerated the procedure well. No immediate complication. IMPRESSION: Successful celiac, hepatic, and SMA angiograms as above. Successful micro catheterization, angiograms, and Gel-Foam embolization of the left hepatic artery resulting in significant reduction in arterial flow to the area the liver were the percutaneous biopsy was performed. Electronically Signed   By: Jerilynn Mages.  Shick M.D.   On: 01/19/2017 10:01   US Biopsy  Result Date: 01/18/2017 INDICATION: LIVER METASTASES, UNKNOWN PRIMARY. EXAM: ULTRASOUND BIOPSY CORE LIVER MEDICATIONS: 1% LIDOCAINE ANESTHESIA/SEDATION: Moderate (conscious) sedation was employed during this procedure. A total of Versed 1.0 mg and Fentanyl 50 mcg was administered  intravenously. Moderate Sedation Time: 10 minutes. The patient's level of consciousness and vital signs were monitored continuously by radiology nursing throughout the procedure under my direct supervision. FLUOROSCOPY TIME:  Fluoroscopy Time: NONE. COMPLICATIONS: None immediate. PROCEDURE: Informed written consent was obtained from the patient after a thorough discussion of the procedural risks, benefits and alternatives. All questions were addressed. Maximal Sterile Barrier Technique  was utilized including caps, mask, sterile gowns, sterile gloves, sterile drape, hand hygiene and skin antiseptic. A timeout was performed prior to the initiation of the procedure. Previous imaging reviewed. Preliminary ultrasound performed. A solid left hepatic lobe lesion was localized from a subxiphoid window. This was correlated with the abdominal MRI. Overlying skin was marked. Under sterile conditions and local anesthesia, a 17 gauge 6.8 cm access needle was advanced from an anterior subxiphoid approach into the left hepatic mass. Needle position confirmed with ultrasound. Three 1 cm 18 gauge core biopsies obtained. Samples placed in formalin. These were intact and non fragmented. Needle tract embolized with Gel-Foam. Postprocedure imaging demonstrates no hemorrhage or hematoma. Patient tolerated the biopsy well. IMPRESSION: Successful ultrasound left hepatic mass 18 gauge core biopsy Electronically Signed   By: Jerilynn Mages.  Shick M.D.   On: 01/18/2017 14:53   Ir US Guide Vasc Access Right  Result Date: 01/19/2017 INDICATION: Acute abdominal pain with acute perihepatic hemorrhage 2 hours status post percutaneous left hepatic metastasis biopsy. EXAM: SELECTIVE VISCERAL ARTERIOGRAPHY; IR EMBO ART VEN HEMORR LYMPH EXTRAV INC GUIDE ROADMAPPING; ADDITIONAL ARTERIOGRAPHY; IR ULTRASOUND GUIDANCE VASC ACCESS RIGHT MEDICATIONS: 1% lidocaine locally. ANESTHESIA/SEDATION: Moderate (conscious) sedation was employed during this procedure. A total  of Versed 1.0 mg and Fentanyl 25 mcg was administered intravenously. Moderate Sedation Time: 50 minutes. The patient's level of consciousness and vital signs were monitored continuously by radiology nursing throughout the procedure under my direct supervision. CONTRAST:  1 ISOVUE-300 IOPAMIDOL (ISOVUE-300) INJECTION 61%, 1 ISOVUE-300 IOPAMIDOL (ISOVUE-300) INJECTION 61% FLUOROSCOPY TIME:  Fluoroscopy Time: 14 minutes 12 seconds (144 mGy). COMPLICATIONS: None immediate. PROCEDURE: Informed consent was obtained from the patient following explanation of the procedure, risks, benefits and alternatives. The patient understands, agrees and consents for the procedure. All questions were addressed. A time out was performed prior to the initiation of the procedure. Maximal barrier sterile technique utilized including caps, mask, sterile gowns, sterile gloves, large sterile drape, hand hygiene, and Betadine prep. Under sterile conditions and local anesthesia, ultrasound micropuncture access performed of the right common femoral artery. Five French sheath inserted. SOS catheter was utilized to select the celiac origin. Celiac angiogram performed. Celiac: Celiac origin is widely patent. Splenic, common hepatic, gastroduodenal artery, proper hepatic, right and left hepatic arteries are all patent. Irregularity of the hepatic vasculature is secondary to hepatomegaly and diffuse hepatic metastases. No active bleeding demonstrated. On the venous phase, the splenic vein and portal vein are all patent. Through the [place] a 5 French catheter, a Renegade STC microcatheter was advanced over a micro glidewire into the proper hepatic artery. Proper hepatic angiogram: Proper hepatic, right and left hepatic arteries are patent. Vasospasm present in the proximal right hepatic artery related to wire manipulation. Limited assessment of the vasculature because of the contrast injection and poor opacification of the left hepatic artery. Over a  double angled Glidewire, the microcatheter was advanced into the left hepatic artery. Left hepatic angiogram performed. Left hepatic: Left hepatic artery is patent. This is the dominant supply to the left hepatic lobe lateral segment were the percutaneous biopsy was performed. Peripheral irregularity of the arterial vasculature related to hepatic metastases. No active extravasation, acute bleeding, or other acute vascular abnormality including AV fistula or pseudoaneurysm. Because of the significant recent acute perihepatic hemorrhage and a biopsy performed in the left hepatic lobe lateral segment, Gel-Foam embolization of left hepatic artery will be performed. Left hepatic artery Gel-Foam embolization: Through the microcatheter, approximately 5 cc of Gel-Foam slurry was instilled into  left hepatic artery. Following embolization, there is significant reduction in the left hepatic arterial peripheral vasculature and sluggish flow but the left hepatic main artery remains patent. Catheter was retracted and advanced into a left hepatic branch supplying the medial segment. Additional left hepatic angiogram performed. Additional left hepatic angiogram: Irregularity of the parenchymal staining related to diffuse hepatic metastases. Scattered peripheral occlusions present in the additional left hepatic branch related to the Gel-Foam embolization. Vasospasm noted at the catheter site. No active bleeding demonstrated. Microcatheter was removed. A final angiogram was performed through the SOS 5 French catheter of the celiac origin. Celiac: Following left hepatic Gel-Foam embolization, there is significant reduction in the arterial vascularity to the the left hepatic lobe where the biopsy was performed. Residual vasospasm present in the proximal left hepatic artery. No other significant acute vascular process. Catheter was retracted and advanced into the SMA. SMA angiogram performed. SMA angiogram: SMA is widely patent. No  variant SMA supply demonstrated to the liver. On the venous phase, the superior mesenteric vein and portal vein all remain patent. Access removed. Hemostasis obtained with manual compression of the right common femoral artery puncture site. Patient tolerated the procedure well. No immediate complication. IMPRESSION: Successful celiac, hepatic, and SMA angiograms as above. Successful micro catheterization, angiograms, and Gel-Foam embolization of the left hepatic artery resulting in significant reduction in arterial flow to the area the liver were the percutaneous biopsy was performed. Electronically Signed   By: Jerilynn Mages.  Shick M.D.   On: 01/19/2017 10:01   Dg Chest Port 1v Same Day  Result Date: 01/18/2017 CLINICAL DATA:  Acute onset of shortness of breath, vomiting and epigastric abdominal pain. Fever. Initial encounter. EXAM: PORTABLE CHEST 1 VIEW COMPARISON:  Chest radiograph performed 01/16/2017 FINDINGS: The lungs are hyperexpanded, with flattening of the hemidiaphragms, compatible with COPD. There is no evidence of focal opacification, pleural effusion or pneumothorax. The cardiomediastinal silhouette is within normal limits. No acute osseous abnormalities are seen. IMPRESSION: Findings of COPD.  Lungs remain otherwise clear. Electronically Signed   By: Garald Balding M.D.   On: 01/18/2017 23:48   Dg Abd Portable 1v  Result Date: 01/18/2017 CLINICAL DATA:  Acute onset of shortness of breath, vomiting and epigastric abdominal pain. Recent liver biopsy. Fever. Initial encounter. EXAM: PORTABLE ABDOMEN - 1 VIEW COMPARISON:  None. FINDINGS: The visualized bowel gas pattern is unremarkable. Scattered air and stool filled loops of colon are seen; no abnormal dilatation of small bowel loops is seen to suggest small bowel obstruction. No free intra-abdominal air is identified, though evaluation for free air is limited on a single supine view. The stomach is largely filled with air. The visualized osseous structures  are within normal limits; the sacroiliac joints are unremarkable in appearance. IMPRESSION: Unremarkable bowel gas pattern; no free intra-abdominal air seen. Moderate amount of stool noted in the colon. Electronically Signed   By: Garald Balding M.D.   On: 01/18/2017 23:50   [place] Guide Roadmapping  Result Date: 01/19/2017 INDICATION: Acute abdominal pain with acute perihepatic hemorrhage 2 hours status post percutaneous left hepatic metastasis biopsy. EXAM: SELECTIVE VISCERAL ARTERIOGRAPHY; IR EMBO ART VEN HEMORR LYMPH EXTRAV INC GUIDE ROADMAPPING; ADDITIONAL ARTERIOGRAPHY; IR ULTRASOUND GUIDANCE VASC ACCESS RIGHT MEDICATIONS: 1% lidocaine locally. ANESTHESIA/SEDATION: Moderate (conscious) sedation was employed during this procedure. A total of Versed 1.0 mg and Fentanyl 25 mcg was administered intravenously. Moderate Sedation Time: 50 minutes. The patient's level of consciousness and vital signs were  monitored continuously by radiology nursing throughout the procedure under my direct supervision. CONTRAST:  1 ISOVUE-300 IOPAMIDOL (ISOVUE-300) INJECTION 61%, 1 ISOVUE-300 IOPAMIDOL (ISOVUE-300) INJECTION 61% FLUOROSCOPY TIME:  Fluoroscopy Time: 14 minutes 12 seconds (144 mGy). COMPLICATIONS: None immediate. PROCEDURE: Informed consent was obtained from the patient following explanation of the procedure, risks, benefits and alternatives. The patient understands, agrees and consents for the procedure. All questions were addressed. A time out was performed prior to the initiation of the procedure. Maximal barrier sterile technique utilized including caps, mask, sterile gowns, sterile gloves, large sterile drape, hand hygiene, and Betadine prep. Under sterile conditions and local anesthesia, ultrasound micropuncture access performed of the right common femoral artery. Five French sheath inserted. SOS catheter was utilized to select the celiac origin. Celiac angiogram performed.  Celiac: Celiac origin is widely patent. Splenic, common hepatic, gastroduodenal artery, proper hepatic, right and left hepatic arteries are all patent. Irregularity of the hepatic vasculature is secondary to hepatomegaly and diffuse hepatic metastases. No active bleeding demonstrated. On the venous phase, the splenic vein and portal vein are all patent. Through the [place] a 5 French catheter, a Renegade STC microcatheter was advanced over a micro glidewire into the proper hepatic artery. Proper hepatic angiogram: Proper hepatic, right and left hepatic arteries are patent. Vasospasm present in the proximal right hepatic artery related to wire manipulation. Limited assessment of the vasculature because of the contrast injection and poor opacification of the left hepatic artery. Over a double angled Glidewire, the microcatheter was advanced into the left hepatic artery. Left hepatic angiogram performed. Left hepatic: Left hepatic artery is patent. This is the dominant supply to the left hepatic lobe lateral segment were the percutaneous biopsy was performed. Peripheral irregularity of the arterial vasculature related to hepatic metastases. No active extravasation, acute bleeding, or other acute vascular abnormality including AV fistula or pseudoaneurysm. Because of the significant recent acute perihepatic hemorrhage and a biopsy performed in the left hepatic lobe lateral segment, Gel-Foam embolization of left hepatic artery will be performed. Left hepatic artery Gel-Foam embolization: Through the microcatheter, approximately 5 cc of Gel-Foam slurry was instilled into left hepatic artery. Following embolization, there is significant reduction in the left hepatic arterial peripheral vasculature and sluggish flow but the left hepatic main artery remains patent. Catheter was retracted and advanced into a left hepatic branch supplying the medial segment. Additional left hepatic angiogram performed. Additional left hepatic  angiogram: Irregularity of the parenchymal staining related to diffuse hepatic metastases. Scattered peripheral occlusions present in the additional left hepatic branch related to the Gel-Foam embolization. Vasospasm noted at the catheter site. No active bleeding demonstrated. Microcatheter was removed. A final angiogram was performed through the SOS 5 French catheter of the celiac origin. Celiac: Following left hepatic Gel-Foam embolization, there is significant reduction in the arterial vascularity to the the left hepatic lobe where the biopsy was performed. Residual vasospasm present in the proximal left hepatic artery. No other significant acute vascular process. Catheter was retracted and advanced into the SMA. SMA angiogram performed. SMA angiogram: SMA is widely patent. No variant SMA supply demonstrated to the liver. On the venous phase, the superior mesenteric vein and portal vein all remain patent. Access removed. Hemostasis obtained with manual compression of the right common femoral artery puncture site. Patient tolerated the procedure well. No immediate complication. IMPRESSION: Successful celiac, hepatic, and SMA angiograms as above. Successful micro catheterization, angiograms, and Gel-Foam embolization of the left hepatic artery resulting in significant reduction in arterial flow to the area  the liver were the percutaneous biopsy was performed. Electronically Signed   By: Jerilynn Mages.  Shick M.D.   On: 01/19/2017 10:01   Ct Angio Abd/pel W/ And/or W/o  Result Date: 01/20/2017 CLINICAL DATA:  Hepatic metastases, status post ultrasound percutaneous biopsy which was complicated by acute bleeding 2 hours after the biopsy. Patient subsequently underwent Gel-Foam embolization of left hepatic artery. Despite transfusions, further decrease in hemoglobin this morning. EXAM: CT ANGIOGRAPHY ABDOMEN AND PELVIS TECHNIQUE: Multidetector CT imaging of the abdomen and pelvis was performed using the standard protocol  during bolus administration of intravenous contrast. Multiplanar reconstructed images including MIPs were obtained and reviewed to evaluate the vascular anatomy. CONTRAST:  100 cc Isovue COMPARISON:  01/18/2017 FINDINGS: Arterial findings: Aorta: Scattered moderate atherosclerosis of the aorta, most pronounced in the infrarenal aorta without occlusion, aneurysm or dissection. No retroperitoneal hemorrhage. Celiac axis: Atherosclerotic origin but remains patent. Splenic, left gastric, and hepatic branches appear patent. No active bleeding or acute vascular process. Superior mesenteric: Atherosclerotic origin but remains patent. SMA branches appear patent. No active bleeding. Left renal:           Mild atherosclerosis but widely patent. Right renal:          Mild atherosclerosis but widely patent. Inferior mesenteric:  Atherosclerotic origin but remains patent. Left iliac: Iliac atherosclerosis noted without occlusion. Left common, internal and external iliac arteries are patent. Right iliac: Similar atherosclerosis without occlusion. Right common, internal and external iliac arteries are patent. Venous findings: Hepatic veins, portal, splenic, mesenteric veins are patent. No veno-occlusive process. IVC, renal veins common iliac veins are patent. Review of the MIP images confirms the above findings. Nonvascular findings: Basilar emphysema noted. Minor atelectasis. Normal heart size. Trace pleural effusions. No pericardial effusion. Hypodense peripherally enhancing hepatic masses again evident compatible with metastases. No evidence of active bleeding or acute vascular process within the liver. No biliary dilatation. Gallbladder is collapsed but contains partially calcified gallstones. Moderate Heterogeneous perihepatic free fluid, compatible with a mixture of ascites and known hemoperitoneum related to the recent left hepatic mass biopsy. Spleen is unremarkable. Small amount of hemoperitoneum about the spleen upper  pole. Adrenal glands, kidneys, ureters, and bladder demonstrate no acute process, obstruction or hydronephrosis. Negative for bowel obstruction, significant dilatation, ileus, or free air. No adenopathy Remote hysterectomy.  Pelvic ascites also evident. No inguinal abnormality or hernia. Intact abdominal wall. No rectus sheath hemorrhage or hematoma over the left hepatic biopsy site. Degenerative changes of the spine. Bones are mildly osteopenic. No acute osseous finding. IMPRESSION: Negative for acute or active intra-abdominal bleeding. Stable diffuse hepatic metastatic process. Stable perihepatic ascites and hemoperitoneum related to the recent left hepatic mass biopsy. Intact abdominal wall without rectus sheath hematoma or hemorrhage. Aortoiliac atherosclerosis Cholelithiasis Pelvic ascites as well These results were called by telephone at the time of interpretation on 01/20/2017 at 2:13 pm to Dr. Florencia Reasons , who verbally acknowledged these results. Electronically Signed   By: Jerilynn Mages.  Shick M.D.   On: 01/20/2017 14:15   US Abdomen Limited Ruq  Result Date: 01/18/2017 CLINICAL DATA:  61 year old female with a history of multiple liver masses, status post percutaneous biopsy today's date. Interval development of abdominal pain EXAM: US ABDOMEN LIMITED - RIGHT UPPER QUADRANT COMPARISON:  Ultrasound 01/18/2017, MRI 01/15/2017 FINDINGS: Gallbladder: Contracted gallbladder with echogenic focus in the lumen compatible with cholelithiasis. Common bile duct: Diameter: 4 mm -5 mm Liver: Re- demonstration of mass within right liver lobe 7 point far cm by 5.4 cm x  7.4 cm. The biopsy of the left liver lobe on the comparison ultrasound today's date demonstrates small echogenic focus, compatible with hemostatic material. There is hyperechoic free fluid adjacent to the liver margin, both anterior and posterior to the left liver lobe. IMPRESSION: New hyperechoic free fluid adjacent to the left liver lobe, status post percutaneous  biopsy, compatible with hemorrhage. Re- demonstration of liver masses, better characterized on prior MRI. These results were called by telephone at the time of interpretation on 01/18/2017 at 5:42 pm to Dr. Annamaria Boots who verbally acknowledged these results. Electronically Signed   By: Corrie Mckusick D.O.   On: 01/18/2017 17:43   US Abdomen Limited Ruq  Result Date: 01/11/2017 CLINICAL DATA:  Right upper quadrant pain . EXAM: US ABDOMEN LIMITED - RIGHT UPPER QUADRANT COMPARISON:  No recent prior . FINDINGS: Gallbladder: Multiple gallstones are noted. Gallstone stones measure up to 1.1 cm. Gallbladder wall is thickened at 1.1 cm. Cholecystitis cannot be excluded. Negative Murphy sign. Common bile duct: Diameter: 2.0 mm Liver: Multiple solid hepatic lesions are noted. Largest lesion measures 9.7 cm and is in the right hepatic lobe. These findings are highly suspicious for metastatic disease. A pancreatic head mass cannot be excluded. Appear pancreatic adenopathy cannot be excluded. Portal vein is narrowed most likely from periportal adenopathy. Portal vein however is patent. IMPRESSION: 1. Multiple gallstones. Gallbladder wall is thickened at 1.1 cm. Cholecystitis cannot be excluded. 2. Multiple solid hepatic lesions. The largest measures 9.7 cm. Pancreatic mass appears to be present. Pancreatic solid lesions noted most likely adenopathy. Portal vein is narrowed possibly from periportal adenopathy. Portal vein however is patent. These findings together suggest malignancy with metastatic disease. Further evaluation with gadolinium-enhanced MRI of the abdomen should be considered. Electronically Signed   By: Marcello Moores  Register   On: 01/11/2017 12:19     PERTINENT LAB RESULTS: CBC:  Recent Labs  01/22/17 0450 01/23/17 1047  WBC 24.5* 27.4*  HGB 8.5* 8.9*  HCT 28.3* 29.6*  PLT 464* 521*   CMET CMP     Component Value Date/Time   NA 135 01/22/2017 0450   K 4.0 01/22/2017 0450   CL 94 (L) 01/22/2017 0450    CO2 30 01/22/2017 0450   GLUCOSE 87 01/22/2017 0450   BUN <5 (L) 01/22/2017 0450   CREATININE 0.36 (L) 01/22/2017 0450   CALCIUM 8.4 (L) 01/22/2017 0450   PROT 6.0 (L) 01/22/2017 0450   ALBUMIN 1.7 (L) 01/22/2017 0450   AST 31 01/22/2017 0450   ALT 16 01/22/2017 0450   ALKPHOS 134 (H) 01/22/2017 0450   BILITOT 0.6 01/22/2017 0450   GFRNONAA >60 01/22/2017 0450   GFRAA >60 01/22/2017 0450    GFR Estimated Creatinine Clearance: 56.4 mL/min (by C-G formula based on SCr of 0.36 mg/dL (L)). No results for input(s): LIPASE, AMYLASE in the last 72 hours. No results for input(s): CKTOTAL, CKMB, CKMBINDEX, TROPONINI in the last 72 hours. Invalid input(s): POCBNP No results for input(s): DDIMER in the last 72 hours. No results for input(s): HGBA1C in the last 72 hours. No results for input(s): CHOL, HDL, LDLCALC, TRIG, CHOLHDL, LDLDIRECT in the last 72 hours. No results for input(s): TSH, T4TOTAL, T3FREE, THYROIDAB in the last 72 hours.  Invalid input(s): FREET3 No results for input(s): VITAMINB12, FOLATE, FERRITIN, TIBC, IRON, RETICCTPCT in the last 72 hours. Coags: No results for input(s): INR in the last 72 hours.  Invalid input(s): PT Microbiology: Recent Results (from the past 240 hour(s))  Culture, blood (Routine X 2)  w Reflex to ID Panel     Status: None   Collection Time: 01/18/17  5:50 PM  Result Value Ref Range Status   Specimen Description BLOOD LEFT ANTECUBITAL  Final   Special Requests BOTTLES DRAWN AEROBIC AND ANAEROBIC 4CC  Final   Culture NO GROWTH 5 DAYS  Final   Report Status 01/23/2017 FINAL  Final  Culture, blood (Routine X 2) w Reflex to ID Panel     Status: None   Collection Time: 01/18/17  6:12 PM  Result Value Ref Range Status   Specimen Description BLOOD LEFT HAND  Final   Special Requests BOTTLES DRAWN AEROBIC AND ANAEROBIC 3CC  Final   Culture NO GROWTH 5 DAYS  Final   Report Status 01/23/2017 FINAL  Final  MRSA PCR Screening     Status: None    Collection Time: 01/18/17  8:50 PM  Result Value Ref Range Status   MRSA by PCR NEGATIVE NEGATIVE Final    Comment:        The GeneXpert MRSA Assay (FDA approved for NASAL specimens only), is one component of a comprehensive MRSA colonization surveillance program. It is not intended to diagnose MRSA infection nor to guide or monitor treatment for MRSA infections.   Culture, blood (x 2)     Status: None   Collection Time: 01/18/17 11:51 PM  Result Value Ref Range Status   Specimen Description BLOOD RIGHT ARM  Final   Special Requests BOTTLES DRAWN AEROBIC AND ANAEROBIC 3ML  Final   Culture NO GROWTH 5 DAYS  Final   Report Status 01/24/2017 FINAL  Final  Culture, blood (x 2)     Status: None   Collection Time: 01/18/17 11:51 PM  Result Value Ref Range Status   Specimen Description BLOOD RIGHT ARM  Final   Special Requests BOTTLES DRAWN AEROBIC AND ANAEROBIC 3ML  Final   Culture NO GROWTH 5 DAYS  Final   Report Status 01/24/2017 FINAL  Final  Culture, Urine     Status: None   Collection Time: 01/19/17  6:25 AM  Result Value Ref Range Status   Specimen Description URINE, RANDOM  Final   Special Requests NONE  Final   Culture NO GROWTH  Final   Report Status 01/20/2017 FINAL  Final    FURTHER DISCHARGE INSTRUCTIONS:  Get Medicines reviewed and adjusted: Please take all your medications with you for your next visit with your Primary MD  Laboratory/radiological data: Please request your Primary MD to go over all hospital tests and procedure/radiological results at the follow up, please ask your Primary MD to get all Hospital records sent to his/her office.  In some cases, they will be blood work, cultures and biopsy results pending at the time of your discharge. Please request that your primary care M.D. goes through all the records of your hospital data and follows up on these results.  Also Note the following: If you experience worsening of your admission symptoms, develop  shortness of breath, life threatening emergency, suicidal or homicidal thoughts you must seek medical attention immediately by calling 911 or calling your MD immediately  if symptoms less severe.  You must read complete instructions/literature along with all the possible adverse reactions/side effects for all the Medicines you take and that have been prescribed to you. Take any new Medicines after you have completely understood and accpet all the possible adverse reactions/side effects.   Do not drive when taking Pain medications or sleeping medications (Benzodaizepines)  Do not take  more than prescribed Pain, Sleep and Anxiety Medications. It is not advisable to combine anxiety,sleep and pain medications without talking with your primary care practitioner  Special Instructions: If you have smoked or chewed Tobacco  in the last 2 yrs please stop smoking, stop any regular Alcohol  and or any Recreational drug use.  Wear Seat belts while driving.  Please note: You were cared for by a hospitalist during your hospital stay. Once you are discharged, your primary care physician will handle any further medical issues. Please note that NO REFILLS for any discharge medications will be authorized once you are discharged, as it is imperative that you return to your primary care physician (or establish a relationship with a primary care physician if you do not have one) for your post hospital discharge needs so that they can reassess your need for medications and monitor your lab values.  Total Time spent coordinating discharge including counseling, education and face to face time equals 45 minutes.  SignedOren Binet 01/24/2017 12:05 PM

## 2017-01-24 NOTE — Telephone Encounter (Signed)
okay

## 2017-01-24 NOTE — CV Procedure (Signed)
    PROCEDURE NOTE:  Procedure:  Transesophageal echocardiogram Operator:  Fransico Him, MD Indications:  MV mass Complications: None  During this procedure the patient is administered a total of Propofol  134 mg to achieve and maintain sedation.  The patient's heart rate, blood pressure, and oxygen saturation are monitored continuously during the procedure by anesthesia.   Results: Normal LV size and hyperdynamic LV function EF 65-70% Normal RV size and function Normal RA Mildly dilated LA Normal TV with mild to moderate TR Normal PV Moderately thickened MV leaflets predominantly on the tip of the anterior MV leaflet with moderate MR.  There is no evidence of flow into the pulmonary veins. Normal trileaflet AV Normal interatrial septum with no evidence of shunt by colorflow dopper  Normal thoracic and ascending aorta.  The patient tolerated the procedure well and was transferred back to their room in stable condition.  Signed: Fransico Him, MD [facility]

## 2017-01-24 NOTE — Transfer of Care (Signed)
Immediate Anesthesia Transfer of Care Note  Patient: Rachel Butler  Procedure(s) Performed: Procedure(s): TRANSESOPHAGEAL ECHOCARDIOGRAM (TEE)  WITH ANESTHESIA (N/A)  Patient Location: Endoscopy Unit  Anesthesia Type:MAC  Level of Consciousness: awake, alert , oriented and patient cooperative  Airway & Oxygen Therapy: Patient Spontanous Breathing and Patient connected to nasal cannula oxygen  Post-op Assessment: Report given to RN and Post -op Vital signs reviewed and stable  Post vital signs: Reviewed and stable  Last Vitals:  Vitals:   01/24/17 0919 01/24/17 0920  BP:  131/78  Pulse: (!) 111 (!) 103  Resp:  17  Temp: 37.3 C     Last Pain:  Vitals:   01/24/17 0919  TempSrc: Oral  PainSc:       Patients Stated Pain Goal: 2 (17/61/60 7371)  Complications: No apparent anesthesia complications

## 2017-01-24 NOTE — Telephone Encounter (Signed)
Patient has been scheduled for Monday, March 5th with Dr Janese Banks at [facility] location for her 1st Appt. Patient has been notified.

## 2017-01-24 NOTE — Discharge Instructions (Signed)
Pain Scale Information, Adult A pain scale is a tool to help you describe your pain. A pain scale often uses pictures, numbers, or words. It can help you explain to your health care provider:  What your pain feels like, such as dull, achy, throbbing, or sharp.  Where pain is located in your body.  How often you have pain. Pain scales range from simple to complex. Which pain scale your health care provider uses depends on your condition. Some pain scales only measure pain intensity. These can be useful if the cause of your pain is known. Other pain scales measure more factors, including if you are able to do your usual activities and how the pain is affecting your mood. These scales are useful if you have long-term (chronic) pain. How is a pain scale used? A pain scale may be used in your health care provider's office or in the hospital. Pain scales for adults are usually in the form of a survey. Your health care provider will ask you the questions on the pain scale or have you fill out a form. Your health care provider may also give you a pain scale to use at home. If you have chronic pain, you may use a pain scale for several weeks or months. Keeping a record of your pain symptoms helps your health care provider see how your pain changes over time. Your health care provider can use a pain scale rating to guide your treatment plan. Why is it important to communicate about pain? Being in pain can make you feel unwell and have negative feelings. It can interfere with your daily activities, such as work, school, hobbies, or relationships. Pain can be a sign you have a condition that needs to be treated. A pain scale can help you describe your pain so your health care provider has a better idea of what you are feeling and how to treat your condition. What are some questions to ask my health care provider?  How accurate are the results of this pain scale?  How often should I use a pain scale?  What is  causing my pain?  How long will I need treatment?  What are the risks of treatment with medicines?  What other treatments can help?  What if my pain does not go away with treatment?  Should I keep a record of pain symptoms or pain scale results at home? This information is not intended to replace advice given to you by your health care provider. Make sure you discuss any questions you have with your health care provider. Document Released: 11/27/2015 Document Revised: 04/19/2016 Document Reviewed: 11/27/2015 Elsevier Interactive Patient Education  2017 [employer].

## 2017-01-24 NOTE — H&P (View-Only) (Signed)
CONSULT NOTE  Date: 01/23/2017               Patient Name:  Rachel Butler MRN: [id number]  DOB: 12-31-1955 Age / Sex: 61 y.o., female        PCP: Viviana Simpler Primary Cardiologist: Caryl Comes ( 2016)             Referring Physician: Sloan Leiter              Reason for Consult: MV endocarditis, severe mitral regurgitation           History of Present Illness: Patient is a 61 y.o. female with a PMHx of liver mass , pulmonary HTN  who was admitted to [facility] on 01/18/2017 for evaluation of fever, tachycardia , and hypotension following a liver biopsy.  An echo showed a possible MV vegetation associated with severe MR. We were asked to see her for further evaluation of this.  She's not had any cardiac problems. She is a heavy smoker. She has seen Dr. Caryl Comes in the past Jan. 2016 )  for shortness of breath and volume retention. Echocardiogram at that time revealed mildly depressed left ventricular systolic function. She did not have pulmonary hypertension at that time. Her estimated PA pressure was 28 mmHg.  Her repeat echocardiogram performed on 01/22/2017 revealed a normal left ventricle systolic function with an ejection fraction of 60-65%. She has grade 1 diastolic dysfunction. She has moderate tricuspid regurgitation with an estimated PA pressure of 56 mm of work. This is moderate pulmonary hypertension. She has severe mitral regurgitation with a possible vegetation associated with the anterior mitral leaflet.  She's had progressively weight loss over the past years. Several years ago weight decreased as low as 86 pounds. She then regained back up to 120 and now has lost oxide 20 pounds over the past 6-12 months. Her appetite remains good but she just is losing weight. She's also had repeated fevers. She's not had any cardiac symptoms.  She had a liver biopsy up proximally 5 days ago. The results of the biopsy have suggested that this is metastatic breast cancer. She has never had  mammograms.  She smokes one half packs of cigarettes a day.  Her family history is positive for coronary artery disease-her father had coronary artery bypass grafting in his 79s.  Medications: Outpatient medications: Prescriptions Prior to Admission  Medication Sig Dispense Refill Last Dose  . Aspirin-Salicylamide-Caffeine (BC HEADACHE POWDER PO) Take 1 Package by mouth 2 (two) times daily. Pain   01/17/2017 at Unknown time  . BIOTIN 5000 PO Take by mouth daily.   01/18/2017 at 0800  . fentaNYL (DURAGESIC - DOSED MCG/HR) 25 MCG/HR patch Place 25 mcg onto the skin every 3 (three) days.   0 01/17/2017 at Unknown time  . furosemide (LASIX) 40 MG tablet TAKE 1 TABLET BY MOUTH ONCE A DAY 30 tablet 0 01/18/2017 at Unknown time  . gabapentin (NEURONTIN) 800 MG tablet Take 1 tablet (800 mg total) by mouth 3 (three) times daily. 90 tablet 11 01/18/2017 at Unknown time  . HYDROcodone-acetaminophen (NORCO/VICODIN) 5-325 MG per tablet Take 1 tablet by mouth 3 (three) times daily as needed for pain.   01/17/2017 at Unknown time  . LORazepam (ATIVAN) 0.5 MG tablet 1-2 tablets 1 hour before MRI. May repeat as needed. 4 tablet 0 Past Week at Unknown time  . Multiple Vitamins-Minerals (MULTIVITAMIN WITH MINERALS) tablet Take 1 tablet by mouth daily.   01/18/2017 at Unknown time  .  promethazine (PHENERGAN) 25 MG tablet TAKE 0.5-1 TABLETS (12.5-25 MG TOTAL) BY MOUTH 3 (THREE) TIMES DAILY AS NEEDED FOR NAUSEA. 30 tablet 1 Past Week at Unknown time  . sennosides-docusate sodium (SENOKOT-S) 8.6-50 MG tablet Take 2 tablets by mouth daily.   01/17/2017 at Unknown time    Current medications: Current Facility-Administered Medications  Medication Dose Route Frequency Provider Last Rate Last Dose  . 0.9 %  sodium chloride infusion   Intravenous Once Jonetta Osgood, MD      . acetaminophen (TYLENOL) tablet 650 mg  650 mg Oral Once Jonetta Osgood, MD      . albuterol (PROVENTIL) (2.5 MG/3ML) 0.083% nebulizer solution  2.5 mg  2.5 mg Nebulization Q2H PRN Jonetta Osgood, MD      . ciprofloxacin (CIPRO) tablet 500 mg  500 mg Oral BID Florencia Reasons, MD   500 mg at 01/23/17 [id number]  . diphenhydrAMINE (BENADRYL) injection 25 mg  25 mg Intravenous Once Jonetta Osgood, MD      . fentaNYL ([place] - dosed mcg/hr) patch 25 mcg  25 mcg Transdermal Q72H Florencia Reasons, MD   25 mcg at 01/20/17 2107  . fentaNYL (SUBLIMAZE) injection 25 mcg  25 mcg Intravenous Q2H PRN Rise Patience, MD   25 mcg at 01/19/17 2014  . gabapentin (NEURONTIN) capsule 400 mg  400 mg Oral TID Honor Loh, RPH   400 mg at 01/23/17 [id number]  . HYDROcodone-acetaminophen (NORCO/VICODIN) 5-325 MG per tablet 1-2 tablet  1-2 tablet Oral Q6H PRN Saverio Danker, PA-C   1 tablet at 01/23/17 0735  . metroNIDAZOLE (FLAGYL) tablet 500 mg  500 mg Oral Q8H Florencia Reasons, MD   500 mg at 01/23/17 0608  . ondansetron (ZOFRAN) tablet 4 mg  4 mg Oral Q6H PRN Jonetta Osgood, MD   4 mg at 01/21/17 1844   Or  . ondansetron [facility]) injection 4 mg  4 mg Intravenous Q6H PRN Jonetta Osgood, MD   4 mg at 01/19/17 0006  . polyethylene glycol (MIRALAX / GLYCOLAX) packet 17 g  17 g Oral Daily Florencia Reasons, MD   17 g at 01/23/17 [id number]  . senna-docusate (Senokot-S) tablet 2 tablet  2 tablet Oral QHS Jonetta Osgood, MD   2 tablet at 01/22/17 2125  . sodium chloride flush (NS) 0.9 % injection 3 mL  3 mL Intravenous Q12H Saverio Danker, PA-C   3 mL at 01/23/17 0839  . sodium chloride flush (NS) 0.9 % injection 3 mL  3 mL Intravenous Q12H Jonetta Osgood, MD   3 mL at 01/23/17 0839     Allergies  Allergen Reactions  . Endoxcin [Lidocaine-Menthol]   . Nubain [Nalbuphine Hcl]   . Penicillins   . Robaxin [Methocarbamol]      Past Medical History:  Diagnosis Date  . Cervical disc disease   . Lumbar disc disease   . Migraines   . Nicotine dependence   . UTI (urinary tract infection)     Past Surgical History:  Procedure Laterality Date  . ABDOMINAL HYSTERECTOMY  1986   right  tube and ovary removed--then laparoscopy after for ?retained cyst?  . CERVICAL DISCECTOMY  1993   C5-6  . CESAREAN SECTION  [id number]  . COLONOSCOPY    . IR GENERIC HISTORICAL  01/18/2017   IR EMBO ART  VEN HEMORR LYMPH EXTRAV  INC GUIDE ROADMAPPING 01/18/2017 Greggory Keen, MD [facility]  . IR GENERIC HISTORICAL  01/18/2017   IR ANGIOGRAM  SELECTIVE EACH ADDITIONAL VESSEL 01/18/2017 Greggory Keen, MD [facility]  . IR GENERIC HISTORICAL  01/18/2017   IR ANGIOGRAM SELECTIVE EACH ADDITIONAL VESSEL 01/18/2017 Greggory Keen, MD [facility]  . IR GENERIC HISTORICAL  01/18/2017   IR US GUIDE VASC ACCESS RIGHT 01/18/2017 Greggory Keen, MD [facility]  . IR GENERIC HISTORICAL  01/18/2017   IR ANGIOGRAM VISCERAL SELECTIVE 01/18/2017 Greggory Keen, MD [facility]  . IR GENERIC HISTORICAL  01/18/2017   IR ANGIOGRAM SELECTIVE EACH ADDITIONAL VESSEL 01/18/2017 Greggory Keen, MD [facility]  . IR GENERIC HISTORICAL  01/18/2017   IR ANGIOGRAM VISCERAL SELECTIVE 01/18/2017 Greggory Keen, MD [facility]  . TONSILLECTOMY AND ADENOIDECTOMY  1970  . TUBAL LIGATION      Family History  Problem Relation Age of Onset  . Cancer Mother   . COPD Father   . Heart disease Father   . Hypertension Father   . Thyroid disease Other   . Cancer Other     mother's side  . Heart disease Other     Dad's side  . Colon cancer Neg Hx     Social History:  reports that she has been smoking Cigarettes.  She has smoked for the past 0.00 years. She has never used smokeless tobacco. She reports that she does not drink alcohol or use drugs.   Review of Systems: Constitutional:  admits to fever, weight loss and  fatigue.  HEENT: denies photophobia, eye pain, redness, hearing loss, ear pain, congestion, sore throat, rhinorrhea, sneezing, neck pain, neck stiffness and tinnitus.  Respiratory: denies SOB, DOE, cough, chest tightness, and wheezing.  Cardiovascular: denies chest pain, palpitations and leg swelling.    Gastrointestinal: denies nausea, vomiting, abdominal pain, diarrhea, constipation, blood in stool.  Genitourinary: denies dysuria, urgency, frequency, hematuria, flank pain and difficulty urinating.  Musculoskeletal: denies  myalgias, back pain, joint swelling, arthralgias and gait problem.   Skin: denies pallor, rash and wound.  Neurological: denies dizziness, seizures, syncope, weakness, light-headedness, numbness and headaches.   Hematological: denies adenopathy, easy bruising, personal or family bleeding history.  Psychiatric/ Behavioral: denies suicidal ideation, mood changes, confusion, nervousness, sleep disturbance and agitation.    Physical Exam: BP 135/82 (BP Location: Left Arm)   Pulse 97   Temp 98 F (36.7 C) (Oral)   Resp 15   Ht 5\' 3"  (1.6 m)   Wt 103 lb 6.4 oz (46.9 kg)   SpO2 99%   BMI 18.32 kg/m   Wt Readings from Last 3 Encounters:  01/23/17 103 lb 6.4 oz (46.9 kg)  01/16/17 97 lb (44 kg)  01/12/17 101 lb (45.8 kg)    General: Vital signs reviewed and noted. Well-developed, well-nourished, in no acute distress; alert,   Head: Normocephalic, atraumatic, sclera anicteric,   Neck: Supple. Negative for carotid bruits. No JVD   Lungs:  Clear bilaterally, no  wheezes, rales, or rhonchi. Breathing is normal   Heart: RRR with S1 S2. Soft systolic  murmur, rubs, or gallops   Abdomen/ GI :  Soft, non-tender, non-distended with normoactive bowel sounds. No hepatomegaly. No rebound/guarding. No obvious abdominal masses   MSK: Strength and the appear normal for age.   Extremities: No clubbing or cyanosis. No edema.  Distal pedal pulses are 2+ and equal   Neurologic:  CN are grossly intact,  No obvious motor or sensory defect.  Alert and oriented X 3. Moves all extremities spontaneously.  Psych: Responds to questions appropriately with a normal affect.     Lab results:  Basic Metabolic Panel:  Recent Labs Lab 01/20/17 0511 01/21/17 0352 01/22/17 0450  NA 138 135  135  K 3.8 3.5 4.0  CL 95* 94* 94*  CO2 34* 33* 30  GLUCOSE 111* 88 87  BUN 6 <5* <5*  CREATININE 0.35* 0.37* 0.36*  CALCIUM 8.8* 8.4* 8.4*  MG 1.9  --   --     Liver Function Tests:  Recent Labs Lab 01/20/17 0511 01/21/17 0352 01/22/17 0450  AST 56* 37 31  ALT 20 17 16   ALKPHOS 138* 130* 134*  BILITOT 0.3 0.4 0.6  PROT 6.3* 5.8* 6.0*  ALBUMIN 1.9* 1.7* 1.7*   No results for input(s): LIPASE, AMYLASE in the last 168 hours. No results for input(s): AMMONIA in the last 168 hours.  CBC:  Recent Labs Lab 01/19/17 1137 01/19/17 1830 01/20/17 0511 01/20/17 1127 01/21/17 0352 01/22/17 0450  WBC 33.7* 33.9* 32.8*  --  26.6* 24.5*  NEUTROABS  --   --  28.9*  --  22.8* 19.8*  HGB 8.1* 7.6* 6.9* 8.4* 8.4* 8.5*  HCT 26.7* 25.6* 22.8* 27.5* 27.2* 28.3*  MCV 84.2 84.8 85.7  --  85.8 86.0  PLT 712* 683* 584*  --  455* 464*    Cardiac Enzymes:  Recent Labs Lab 01/18/17 1558 01/19/17 0639  TROPONINI 0.03* 0.03*    BNP: Invalid input(s): POCBNP  CBG: No results for input(s): GLUCAP in the last 168 hours.  Coagulation Studies: No results for input(s): LABPROT, INR in the last 72 hours.   Other results: Personal review of EKG shows :  - Jan. 23, 2018:   Sinus tach at 123.  She has 1-2 mm St depression in the inf. And lateral leads.    Imaging: Ct Angio Chest Pe W Or Wo Contrast  Result Date: 01/22/2017 CLINICAL DATA:  Hypoxia. EXAM: CT ANGIOGRAPHY CHEST WITH CONTRAST TECHNIQUE: Multidetector CT imaging of the chest was performed using the standard protocol during bolus administration of intravenous contrast. Multiplanar CT image reconstructions and MIPs were obtained to evaluate the vascular anatomy. CONTRAST:  80 mL Isovue 370 COMPARISON:  One-view chest x-ray 01/18/2017. CT of the abdomen and pelvis 01/20/2017 FINDINGS: Cardiovascular: The heart size is normal. The pulmonary arteries demonstrate excellent opacification. There is focal filling defect to suggest  pulmonary embolus. Pulmonary arterial size is normal. The aorta demonstrates atherosclerotic change without aneurysm. A 4 vessel arch configuration is present. The left vertebral artery Mediastinum/Nodes: No significant mediastinal or axillary adenopathy is present. The esophagus is unremarkable. Secretions versus small tracheal nodules are evident. Lungs/Pleura: Centrilobular emphysema is present. No focal nodule or mass lesion is present. Mild dependent atelectasis is present bilaterally. There are small bilateral pleural effusions. Upper Abdomen: Liver metastases are again noted. Musculoskeletal: No focal lytic or blastic lesions are present. Vertebral body heights alignment are maintained. Review of the MIP images confirms the above findings. IMPRESSION: 1. No pulmonary embolus. 2. Centrilobular emphysema. 3. Secretions versus small tracheal nodules. Short-term follow-up CT scan of or bronchoscopy would be useful for further evaluation. 4. Aortic atherosclerosis. 5. Liver metastases. Electronically Signed   By: San Morelle M.D.   On: 01/22/2017 09:53       Assessment & Plan:   1. Possible mitral valve vegetation: Her blood cultures have returned negative. She had has had a history of some fevers and weight loss but this also could be related to her metastatic cancer. This could be a case of [place] Long endocarditis associated with this metastatic cancer. I think  that a transesophageal echo may be beneficial in helping [place] identify this further. Underlying treatment would involve treatment of her underlying cancer. I do not think that there is a role for anticoagulation.  2. Mitral regurgitation: She has fairly severe mitral regurgitation. At this point I do not think that she is a candidate for mitral valve repair or replacement given her metastatic cancer. I think we would want to see her response to therapy before we committed to mitral valve repair or replacement. Fortunately, she is  basically asymptomatic and is not having too much short of breath; Will set her up for a TEE tomorrow Ive explained the procedure, risks, benefits.  She understands and agrees to proceed.  2. Moderate pulmonary hypertension: I suspect that she has some underlying lung disease. The CT  Angiogram  did not show any evidence of pulmonary embolus. She probably has COPD.     Thayer Headings, Brooke Bonito., MD, [facility] 01/23/2017, 10:51 AM Office - [phone] Pager [phone]

## 2017-01-24 NOTE — Consult Note (Signed)
[facility] CM Primary Care Navigator  01/24/2017  Rachel Butler Feb 06, 1956 [id number]   Met with patient and husband (Ron) at the bedside to identify possible discharge needs. Patient reports feeling a knot on her right side and having pain to that side that had led to this admission.  Patient endorses Dr. Viviana Simpler with [facility] as her primary care provider.   Patient shared using [facility] in [place] to obtain medications without difficulty.   Patient reports that husband is managing her medications at home using "pill box" system weekly.  She states that husband had been providing transportation to her doctors' appointments.  Her husband is the primary caregiver at home as stated.   Anticipated discharge plan is home with home health services per patient.  Patient and husband voiced understanding to call primary care provider's office when she gets home, for a post discharge follow-up appointment within a week or sooner if needed. Patient letter (with PCP's contact number) was provided as their reminder.  Patient denies any health management needs or concerns at this time.  [facility] care management contact information was provided for future needs that may arise.  For additional questions please contact:  Edwena Felty A. Lillionna Nabi, BSN, RN-BC [facility] PRIMARY CARE Navigator Cell: [phone]

## 2017-01-25 ENCOUNTER — Telehealth: Payer: Self-pay | Admitting: *Deleted

## 2017-01-25 ENCOUNTER — Encounter (HOSPITAL_COMMUNITY): Payer: Self-pay | Admitting: Cardiology

## 2017-01-25 DIAGNOSIS — G894 Chronic pain syndrome: Secondary | ICD-10-CM | POA: Diagnosis not present

## 2017-01-25 DIAGNOSIS — D376 Neoplasm of uncertain behavior of liver, gallbladder and bile ducts: Secondary | ICD-10-CM | POA: Diagnosis not present

## 2017-01-25 DIAGNOSIS — Z79891 Long term (current) use of opiate analgesic: Secondary | ICD-10-CM | POA: Diagnosis not present

## 2017-01-25 DIAGNOSIS — F17210 Nicotine dependence, cigarettes, uncomplicated: Secondary | ICD-10-CM | POA: Diagnosis not present

## 2017-01-25 DIAGNOSIS — M5136 Other intervertebral disc degeneration, lumbar region: Secondary | ICD-10-CM | POA: Diagnosis not present

## 2017-01-25 DIAGNOSIS — G43909 Migraine, unspecified, not intractable, without status migrainosus: Secondary | ICD-10-CM | POA: Diagnosis not present

## 2017-01-25 DIAGNOSIS — M5090 Cervical disc disorder, unspecified, unspecified cervical region: Secondary | ICD-10-CM | POA: Diagnosis not present

## 2017-01-25 NOTE — Telephone Encounter (Signed)
Per Discharge Summary:  PATIENT DETAILS Name: Rachel Butler Age: 61 y.o. Sex: female Date of Birth: 11/11/1956 MRN: [id number]. Admitting Physician: Jonetta Osgood, MD [place]:[id number] Silvio Pate, MD  Admit Date: 01/18/2017 Discharge date: 01/24/2017  Recommendations for Outpatient Follow-up:  1. Follow up with PCP in 1weeks 2. Please obtain BMP/CBC in one week 3. Please refer to oncology upon follow-up with PCP. May also need to involve hospice at some point. 4. Please follow up on the following pending results:  Admitted From:  Home  Disposition: Home with home health services ---  Transition Care Management Follow-up Telephone Call   How have you been since you were released from the hospital? "I have a problem with the pain in my abd." Pt has a call out to pain management. No acute complaints. States that she is used to taking more pain medication than the hospital prescribed upon discharge.    Do you understand why you were in the hospital? yes   Do you understand the discharge instructions? yes   Where were you discharged to? Home   Items Reviewed:  Medications reviewed: yes  Allergies reviewed: yes  Dietary changes reviewed: yes  Referrals reviewed: yes   Functional Questionnaire:   Activities of Daily Living (ADLs):   She states they are independent in the following: ambulation, feeding, continence, grooming and dressing. Ambulating with walker. States they require assistance with the following: bathing and hygiene and toileting. Husband has been helping her. Pt states she has a shower chair.    Any transportation issues/concerns?: no   Any patient concerns? no   Confirmed importance and date/time of follow-up visits scheduled yes  Provider Appointment booked with Dr Silvio Pate March 14th, 12:15.  Confirmed with patient if condition begins to worsen call PCP or go to the ER.  Patient was given the office number and encouraged to call back with  question or concerns.  : yes

## 2017-01-28 ENCOUNTER — Inpatient Hospital Stay: Payer: Medicare Other

## 2017-01-28 ENCOUNTER — Other Ambulatory Visit: Payer: Self-pay | Admitting: *Deleted

## 2017-01-28 ENCOUNTER — Inpatient Hospital Stay: Payer: Medicare Other | Attending: Oncology | Admitting: Oncology

## 2017-01-28 ENCOUNTER — Encounter: Payer: Self-pay | Admitting: Oncology

## 2017-01-28 VITALS — BP 88/60 | HR 132 | Temp 100.3°F | Resp 21 | Ht 63.58 in | Wt 104.1 lb

## 2017-01-28 DIAGNOSIS — Z90710 Acquired absence of both cervix and uterus: Secondary | ICD-10-CM | POA: Insufficient documentation

## 2017-01-28 DIAGNOSIS — G893 Neoplasm related pain (acute) (chronic): Secondary | ICD-10-CM | POA: Insufficient documentation

## 2017-01-28 DIAGNOSIS — I739 Peripheral vascular disease, unspecified: Secondary | ICD-10-CM | POA: Insufficient documentation

## 2017-01-28 DIAGNOSIS — Z880 Allergy status to penicillin: Secondary | ICD-10-CM

## 2017-01-28 DIAGNOSIS — E871 Hypo-osmolality and hyponatremia: Secondary | ICD-10-CM | POA: Insufficient documentation

## 2017-01-28 DIAGNOSIS — R600 Localized edema: Secondary | ICD-10-CM | POA: Insufficient documentation

## 2017-01-28 DIAGNOSIS — G629 Polyneuropathy, unspecified: Secondary | ICD-10-CM | POA: Insufficient documentation

## 2017-01-28 DIAGNOSIS — I700 Atherosclerosis of aorta: Secondary | ICD-10-CM | POA: Diagnosis not present

## 2017-01-28 DIAGNOSIS — Z79899 Other long term (current) drug therapy: Secondary | ICD-10-CM

## 2017-01-28 DIAGNOSIS — R5383 Other fatigue: Secondary | ICD-10-CM | POA: Diagnosis not present

## 2017-01-28 DIAGNOSIS — R188 Other ascites: Secondary | ICD-10-CM | POA: Diagnosis not present

## 2017-01-28 DIAGNOSIS — I5032 Chronic diastolic (congestive) heart failure: Secondary | ICD-10-CM | POA: Diagnosis not present

## 2017-01-28 DIAGNOSIS — M519 Unspecified thoracic, thoracolumbar and lumbosacral intervertebral disc disorder: Secondary | ICD-10-CM | POA: Insufficient documentation

## 2017-01-28 DIAGNOSIS — D509 Iron deficiency anemia, unspecified: Secondary | ICD-10-CM | POA: Diagnosis not present

## 2017-01-28 DIAGNOSIS — Z5111 Encounter for antineoplastic chemotherapy: Secondary | ICD-10-CM | POA: Diagnosis not present

## 2017-01-28 DIAGNOSIS — I272 Pulmonary hypertension, unspecified: Secondary | ICD-10-CM | POA: Diagnosis not present

## 2017-01-28 DIAGNOSIS — Z809 Family history of malignant neoplasm, unspecified: Secondary | ICD-10-CM | POA: Insufficient documentation

## 2017-01-28 DIAGNOSIS — K869 Disease of pancreas, unspecified: Secondary | ICD-10-CM | POA: Insufficient documentation

## 2017-01-28 DIAGNOSIS — F17210 Nicotine dependence, cigarettes, uncomplicated: Secondary | ICD-10-CM | POA: Insufficient documentation

## 2017-01-28 DIAGNOSIS — C787 Secondary malignant neoplasm of liver and intrahepatic bile duct: Secondary | ICD-10-CM | POA: Diagnosis not present

## 2017-01-28 DIAGNOSIS — R531 Weakness: Secondary | ICD-10-CM | POA: Insufficient documentation

## 2017-01-28 DIAGNOSIS — R160 Hepatomegaly, not elsewhere classified: Secondary | ICD-10-CM | POA: Insufficient documentation

## 2017-01-28 DIAGNOSIS — R000 Tachycardia, unspecified: Secondary | ICD-10-CM | POA: Diagnosis not present

## 2017-01-28 DIAGNOSIS — K8020 Calculus of gallbladder without cholecystitis without obstruction: Secondary | ICD-10-CM | POA: Insufficient documentation

## 2017-01-28 DIAGNOSIS — C50919 Malignant neoplasm of unspecified site of unspecified female breast: Secondary | ICD-10-CM

## 2017-01-28 DIAGNOSIS — R5381 Other malaise: Secondary | ICD-10-CM | POA: Diagnosis not present

## 2017-01-28 DIAGNOSIS — R64 Cachexia: Secondary | ICD-10-CM | POA: Diagnosis not present

## 2017-01-28 DIAGNOSIS — Z90722 Acquired absence of ovaries, bilateral: Secondary | ICD-10-CM | POA: Insufficient documentation

## 2017-01-28 DIAGNOSIS — R1011 Right upper quadrant pain: Secondary | ICD-10-CM | POA: Insufficient documentation

## 2017-01-28 DIAGNOSIS — E8779 Other fluid overload: Secondary | ICD-10-CM | POA: Insufficient documentation

## 2017-01-28 DIAGNOSIS — I2510 Atherosclerotic heart disease of native coronary artery without angina pectoris: Secondary | ICD-10-CM | POA: Insufficient documentation

## 2017-01-28 DIAGNOSIS — Z9981 Dependence on supplemental oxygen: Secondary | ICD-10-CM | POA: Insufficient documentation

## 2017-01-28 DIAGNOSIS — L89159 Pressure ulcer of sacral region, unspecified stage: Secondary | ICD-10-CM | POA: Diagnosis not present

## 2017-01-28 DIAGNOSIS — E860 Dehydration: Secondary | ICD-10-CM

## 2017-01-28 DIAGNOSIS — B370 Candidal stomatitis: Secondary | ICD-10-CM | POA: Diagnosis not present

## 2017-01-28 DIAGNOSIS — J449 Chronic obstructive pulmonary disease, unspecified: Secondary | ICD-10-CM | POA: Diagnosis not present

## 2017-01-28 DIAGNOSIS — I509 Heart failure, unspecified: Secondary | ICD-10-CM | POA: Insufficient documentation

## 2017-01-28 DIAGNOSIS — Z7189 Other specified counseling: Secondary | ICD-10-CM | POA: Insufficient documentation

## 2017-01-28 LAB — CBC WITH DIFFERENTIAL/PLATELET
BAND NEUTROPHILS: 0 %
BLASTS: 0 %
Basophils Absolute: 0 10*3/uL (ref 0–0.1)
Basophils Relative: 0 %
EOS ABS: 0 10*3/uL (ref 0–0.7)
EOS PCT: 0 %
HCT: 31.4 % — ABNORMAL LOW (ref 35.0–47.0)
HEMOGLOBIN: 9.7 g/dL — AB (ref 12.0–16.0)
LYMPHS ABS: 0.9 10*3/uL — AB (ref 1.0–3.6)
LYMPHS PCT: 2 %
MCH: 25.4 pg — AB (ref 26.0–34.0)
MCHC: 31 g/dL — AB (ref 32.0–36.0)
MCV: 81.7 fL (ref 80.0–100.0)
METAMYELOCYTES PCT: 0 %
MONOS PCT: 8 %
Monocytes Absolute: 3.4 10*3/uL — ABNORMAL HIGH (ref 0.2–0.9)
Myelocytes: 0 %
NEUTROS ABS: 38.3 10*3/uL — AB (ref 1.4–6.5)
Neutrophils Relative %: 90 %
OTHER: 0 %
PLATELETS: 776 10*3/uL — AB (ref 150–440)
Promyelocytes Absolute: 0 %
RBC: 3.84 MIL/uL (ref 3.80–5.20)
RDW: 20 % — ABNORMAL HIGH (ref 11.5–14.5)
SMEAR REVIEW: INCREASED
WBC: 42.6 10*3/uL — ABNORMAL HIGH (ref 3.6–11.0)
nRBC: 0 /100 WBC

## 2017-01-28 LAB — RETICULOCYTES
RBC.: 3.74 MIL/uL — ABNORMAL LOW (ref 3.80–5.20)
RETIC COUNT ABSOLUTE: 123.4 10*3/uL (ref 19.0–183.0)
Retic Ct Pct: 3.3 % — ABNORMAL HIGH (ref 0.4–3.1)

## 2017-01-28 LAB — COMPREHENSIVE METABOLIC PANEL
ALBUMIN: 2.1 g/dL — AB (ref 3.5–5.0)
ALT: 25 U/L (ref 14–54)
ANION GAP: 14 (ref 5–15)
AST: 53 U/L — AB (ref 15–41)
Alkaline Phosphatase: 253 U/L — ABNORMAL HIGH (ref 38–126)
BUN: 7 mg/dL (ref 6–20)
CO2: 27 mmol/L (ref 22–32)
Calcium: 8.4 mg/dL — ABNORMAL LOW (ref 8.9–10.3)
Chloride: 87 mmol/L — ABNORMAL LOW (ref 101–111)
Creatinine, Ser: 0.51 mg/dL (ref 0.44–1.00)
GFR calc Af Amer: >60 mL/min (ref >60)
GFR calc non Af Amer: >60 mL/min (ref >60)
GLUCOSE: 89 mg/dL (ref 65–99)
POTASSIUM: 3.3 mmol/L — AB (ref 3.5–5.1)
SODIUM: 128 mmol/L — AB (ref 135–145)
Total Bilirubin: 0.9 mg/dL (ref 0.3–1.2)
Total Protein: 7 g/dL (ref 6.5–8.1)

## 2017-01-28 LAB — FERRITIN: Ferritin: 971 ng/mL — ABNORMAL HIGH (ref 11–307)

## 2017-01-28 LAB — IRON AND TIBC
Iron: 14 ug/dL — ABNORMAL LOW (ref 28–170)
SATURATION RATIOS: 13 % (ref 10.4–31.8)
TIBC: 106 ug/dL — ABNORMAL LOW (ref 250–450)
UIBC: 92 ug/dL

## 2017-01-28 LAB — FOLATE: Folate: 13.1 ng/mL (ref >5.9)

## 2017-01-28 LAB — TSH: TSH: 2.176 u[IU]/mL (ref 0.350–4.500)

## 2017-01-28 MED ORDER — OXYCODONE HCL 10 MG PO TABS: 10.0000 mg | ORAL_TABLET | Freq: Four times a day (QID) | ORAL | 0 refills | Status: DC | PRN

## 2017-01-28 MED ORDER — SODIUM CHLORIDE 0.9 % IV SOLN
Freq: Once | INTRAVENOUS | Status: AC
  Administered 2017-01-28: 16:00:00 via INTRAVENOUS
  Filled 2017-01-28: qty 1000

## 2017-01-28 NOTE — Progress Notes (Signed)
Patient reports taking duragesic patch per orders, taking prn pain meds as ordered and rates pain 7/10 in right arm, right breast and right side of the abdomen.  Patient has increased HR and temperature today see vitals.  Patient reports that she has been running a low grade fever.

## 2017-01-28 NOTE — Progress Notes (Signed)
Hematology/Oncology Consult note [facility] [phone] [phone]  Patient Care Team: Venia Carbon, MD as PCP - General (Pediatrics)   Name of the patient: Rachel Butler  [id number]  1956-04-10    Reason for referral- metastatic breast cancer   Referring physician- Dr. Silvio Pate  Date of visit: 01/28/17   History of presenting illness-  Patient is a 61 year old female who presented with symptoms of right upper quadrant abdominal pain and underwent ultrasound on 01/11/2017.  2. Ultrasound showed multiple gallstones. Multiple solid hepatic lesions area and the largest measures 9.7 cm. Pancreatic mass appears to be present. Pancreatic solid lesions noted most likely adenopathy. Portal vein is narrowed possibly from periportal adenopathy.  3. MRI of the abdomen on 01/15/2017 showed enlargement of the liver and contains multiple ill-defined heterogeneous and peripheral enhancing lesions worrisome for multifocal metastatic disease. The largest lesion is in the medial segment of the left lobe measuring 6 cm. Lesion within segment 8 measures 3.2 cm segment 5 lesion measures 3.1 cm. The gallbladder fundus extends into the large mass within the medial segment of the left lobe. No biliary dilation. No inflammation or mass in the pancreas. Enlarged portocaval and porta hepatic lymph nodes worrisome for metastatic adenopathy.  4. Patient underwent ultrasound-guided liver biopsy which showed metastatic adenocarcinoma. Malignant cells are positive for CK 7 and GATA 3 as well as focal positive for estrogen. ER 40% positive PR negative. Cells are negative for CK 20, CDX2, TTF1, PAX8, napsin A, p53, WT 1 This is favored to represent breast primary  5.  Post liver biopsy patient had acute bleeding for about 2 hours and underwent Gelfoam embolization of the left hepatic artery. CTA of the abdomen did not reveal any acute or active intra-abdominal bleeding.  Stable diffuse hepatic metastatic process. Stable perihepatic ascites and hemoperitoneum related to recent left hepatic mass biopsy. Intact abdominal wall without rectus sheath hematoma or hemorrhage. Pelvic ascites. Pelvic ascites.  6.  CTA of the chest showed no pulmonary embolus. Centrilobular emphysema. Secretions versus small pretracheal nodules. Short-term follow-up CT or bronchoscopy would be useful for further evaluation.   7. There was also a concern for endocarditis which was ruled out with TEE.   Patient was recently discharged from our hospital and currently reports significant right upper quadrant abdominal which is not adequately controlled with hydrocodone. She is also been considerably weak and fatigued. appetite has been fair. Patient smokes about 1 pack of cigarettes per day and has been doing so for the last 30 years. Reports that she is trying to quit. She is status post hysterectomy and unilateral salpingo-oophorectomy in 1984. She has not had any prior mammograms   ECOG PS- 2  Pain scale- 5   Review of systems- Review of Systems  Constitutional: Positive for malaise/fatigue. Negative for chills, fever and weight loss.  HENT: Negative for congestion, ear discharge and nosebleeds.   Eyes: Negative for blurred vision.  Respiratory: Negative for cough, hemoptysis, sputum production, shortness of breath and wheezing.   Cardiovascular: Negative for chest pain, palpitations, orthopnea and claudication.  Gastrointestinal: Positive for abdominal pain. Negative for blood in stool, constipation, diarrhea, heartburn, melena, nausea and vomiting.  Genitourinary: Negative for dysuria, flank pain, frequency, hematuria and urgency.  Musculoskeletal: Negative for back pain, joint pain and myalgias.  Skin: Negative for rash.  Neurological: Positive for weakness. Negative for dizziness, tingling, focal weakness, seizures and headaches.  Endo/Heme/Allergies: Does not bruise/bleed easily.   Psychiatric/Behavioral: Negative for depression and suicidal  ideas. The patient does not have insomnia.     Allergies  Allergen Reactions  . Indomethacin Other (See Comments)    Can not urinate  . Penicillins Rash  . Robaxin [Methocarbamol] Other (See Comments)    Urinary retention  . Nubain [Nalbuphine Hcl]     Patient Active Problem List   Diagnosis Date Noted  . Bacteremia   . Mitral valve mass   . Liver lesion 01/18/2017  . Sepsis ([place]) 01/18/2017  . Liver metastases ([place]) 01/16/2017  . RUQ abdominal pain 01/09/2017  . Intermittent claudication ([place]) 01/09/2017  . Neuropathy ([place]) 12/29/2015  . Chronic diastolic heart failure ([place]) 08/22/2015  . Generalized edema 08/17/2015  . Preventative health care 12/20/2014  . Advance directive discussed with patient 12/20/2014  . Mild malnutrition ([place]) 12/07/2014  . Chronic pain 12/05/2014  . Nicotine dependence   . Cervical disc disease   . Lumbar disc disease   . Migraines      Past Medical History:  Diagnosis Date  . Anemia   . Arthritis   . Blood transfusion without reported diagnosis   . Cancer ([place])   . Cervical disc disease   . COPD (chronic obstructive pulmonary disease) ([place])   . Intervertebral disk disease   . Lumbar disc disease   . Migraines   . Nicotine dependence   . UTI (urinary tract infection)      Past Surgical History:  Procedure Laterality Date  . ABDOMINAL HYSTERECTOMY  1986   right tube and ovary removed--then laparoscopy after for ?retained cyst?  . CERVICAL DISCECTOMY  1993   C5-6  . CESAREAN SECTION  [id number]  . COLONOSCOPY    . disk removal     c5c6  . IR GENERIC HISTORICAL  01/18/2017   IR EMBO ART  VEN HEMORR LYMPH EXTRAV  INC GUIDE ROADMAPPING 01/18/2017 Greggory Keen, MD [facility]  . IR GENERIC HISTORICAL  01/18/2017   IR ANGIOGRAM SELECTIVE EACH ADDITIONAL VESSEL 01/18/2017 Greggory Keen, MD [facility]  . IR GENERIC HISTORICAL  01/18/2017   IR ANGIOGRAM SELECTIVE EACH  ADDITIONAL VESSEL 01/18/2017 Greggory Keen, MD [facility]  . IR GENERIC HISTORICAL  01/18/2017   IR US GUIDE VASC ACCESS RIGHT 01/18/2017 Greggory Keen, MD [facility]  . IR GENERIC HISTORICAL  01/18/2017   IR ANGIOGRAM VISCERAL SELECTIVE 01/18/2017 Greggory Keen, MD [facility]  . IR GENERIC HISTORICAL  01/18/2017   IR ANGIOGRAM SELECTIVE EACH ADDITIONAL VESSEL 01/18/2017 Greggory Keen, MD [facility]  . IR GENERIC HISTORICAL  01/18/2017   IR ANGIOGRAM VISCERAL SELECTIVE 01/18/2017 Greggory Keen, MD [facility]  . TEE WITHOUT CARDIOVERSION N/A 01/24/2017   Procedure: TRANSESOPHAGEAL ECHOCARDIOGRAM (TEE)  WITH ANESTHESIA;  Surgeon: Sueanne Margarita, MD;  Location: [place];  Service: Cardiovascular;  Laterality: N/A;  . [place]  . TUBAL LIGATION      Social History   Social History  . Marital status: Married    Spouse name: N/A  . Number of children: 2  . Years of education: N/A   Occupational History  . Social research officer, government ---retired   . 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 Smoking status: Current Every Day Smoker    Packs/day: 1.00    Years: 40.00    Types: Cigarettes  . Smokeless tobacco: Never Used  . Alcohol use No  . Drug use: No  . Sexual activity: Not on file   Other Topics Concern  .  Not on file   Social History Narrative   [name] daughter      Has living will   Husband is health care POA   Would accept resuscitation but no prolonged ventilation   No tube feeds if cognitively unaware     Family History  Problem Relation Age of Onset  . Cancer Mother   . COPD Father   . Heart disease Father   . Hypertension Father   . Thyroid disease Other   . Cancer Other     mother's side  . Heart disease Other     Dad's side  . Colon cancer Neg Hx      Current Outpatient Prescriptions:  .  BIOTIN 5000 PO, Take by mouth daily., Disp: , Rfl:  .  fentaNYL (DURAGESIC - DOSED MCG/HR) 25 MCG/HR patch, Place 25  mcg onto the skin every 3 (three) days. , Disp: , Rfl: 0 .  gabapentin (NEURONTIN) 800 MG tablet, Take 1 tablet (800 mg total) by mouth 3 (three) times daily., Disp: 90 tablet, Rfl: 11 .  HYDROcodone-acetaminophen (NORCO/VICODIN) 5-325 MG tablet, Take 1 tablet by mouth 3 (three) times daily as needed., Disp: 20 tablet, Rfl: 0 .  Melatonin 2.5 MG CAPS, Take 1 capsule by mouth at bedtime., Disp: , Rfl:  .  metoprolol tartrate (LOPRESSOR) 25 MG tablet, Take 0.5 tablets (12.5 mg total) by mouth 2 (two) times daily., Disp: 60 tablet, Rfl: 0 .  Multiple Vitamins-Minerals (MULTIVITAMIN WITH MINERALS) tablet, Take 1 tablet by mouth daily., Disp: , Rfl:  .  polyethylene glycol (MIRALAX / GLYCOLAX) packet, Take 17 g by mouth daily., Disp: 14 each, Rfl: 0 .  promethazine (PHENERGAN) 25 MG tablet, TAKE 0.5-1 TABLETS (12.5-25 MG TOTAL) BY MOUTH 3 (THREE) TIMES DAILY AS NEEDED FOR NAUSEA., Disp: 30 tablet, Rfl: 1 .  sennosides-docusate sodium (SENOKOT-S) 8.6-50 MG tablet, Take 2 tablets by mouth daily., Disp: , Rfl:  .  tiZANidine (ZANAFLEX) 4 MG capsule, Take 4 mg by mouth 3 (three) times daily. Takes 1 tid and up to 3 tablets at bedtime to sleep, Disp: , Rfl:    Physical exam:  Vitals:   01/28/17 1451  BP: (!) 88/60  Pulse: (!) 132  Resp: (!) 21  Temp: 100.3 F (37.9 C)  TempSrc: Tympanic  Weight: 104 lb 0.9 oz (47.2 kg)  Height: 5' 3.58" (1.615 m)   Physical Exam  Constitutional: She is oriented to person, place, and time.  Thin, cachectic and fatigued. Pale-appearing  HENT:  Head: Normocephalic and atraumatic.  Eyes: EOM are normal. Pupils are equal, round, and reactive to light.  Neck: Normal range of motion.  Cardiovascular: Regular rhythm and normal heart sounds.   tachycardic  Pulmonary/Chest: Effort normal and breath sounds normal.  Abdominal: Soft. Bowel sounds are normal.  TTP RUQ. No palpable ascites  Neurological: She is alert and oriented to person, place, and time.  Skin: Skin  is warm and dry.    Breast exam was performed in seated and lying down position.  No evidence of any palpable masses in the left breast. Fibrocystic changes noted. No evidence of axillary adenopathy. No evidence of any palpable masses or lumps in the right breast. No evidence of right axillary adenopathy   CMP Latest Ref Rng & Units 01/22/2017  Glucose 65 - 99 mg/dL 87  BUN 6 - 20 mg/dL <5(L)  Creatinine 0.44 - 1.00 mg/dL 0.36(L)  Sodium 135 - 145 mmol/L 135  Potassium 3.5 - 5.1 mmol/L 4.0  Chloride 101 - 111 mmol/L 94(L)  CO2 22 - 32 mmol/L 30  Calcium 8.9 - 10.3 mg/dL 8.4(L)  Total Protein 6.5 - 8.1 g/dL 6.0(L)  Total Bilirubin 0.3 - 1.2 mg/dL 0.6  Alkaline Phos 38 - 126 U/L 134(H)  AST 15 - 41 U/L 31  ALT 14 - 54 U/L 16   CBC Latest Ref Rng & Units 01/23/2017  WBC 4.0 - 10.5 K/uL 27.4(H)  Hemoglobin 12.0 - 15.0 g/dL 8.9(L)  Hematocrit 36.0 - 46.0 % 29.6(L)  Platelets 150 - 400 K/uL 521(H)    No images are attached to the encounter.  Dg Chest 2 View  Result Date: 01/16/2017 CLINICAL DATA:  Liver metastases.  Tobacco use. EXAM: CHEST  2 VIEW COMPARISON:  August 25, 2015 FINDINGS: There are nipple shadows bilaterally. There is no evident edema or consolidation. No pulmonary nodular lesion is demonstrable by radiography. Heart size and pulmonary vascularity are normal. No adenopathy. There is atherosclerotic calcification in the aorta. No blastic or lytic bone lesions. IMPRESSION: Aortic atherosclerosis. No edema or consolidation. No evident mass or adenopathy. Electronically Signed   By: Lowella Grip III M.D.   On: 01/16/2017 14:34   Ct Angio Chest Pe W Or Wo Contrast  Addendum Date: 01/23/2017   ADDENDUM REPORT: 01/23/2017 14:00 ADDENDUM: Voice recognition error: The third sentence of the Findings section should read "There is no focal filling defect to suggest pulmonary embolus." Electronically Signed   By: San Morelle M.D.   On: 01/23/2017 14:00   Result Date:  01/23/2017 CLINICAL DATA:  Hypoxia. EXAM: CT ANGIOGRAPHY CHEST WITH CONTRAST TECHNIQUE: Multidetector CT imaging of the chest was performed using the standard protocol during bolus administration of intravenous contrast. Multiplanar CT image reconstructions and MIPs were obtained to evaluate the vascular anatomy. CONTRAST:  80 mL Isovue 370 COMPARISON:  One-view chest x-ray 01/18/2017. CT of the abdomen and pelvis 01/20/2017 FINDINGS: Cardiovascular: The heart size is normal. The pulmonary arteries demonstrate excellent opacification. There is focal filling defect to suggest pulmonary embolus. Pulmonary arterial size is normal. The aorta demonstrates atherosclerotic change without aneurysm. A 4 vessel arch configuration is present. The left vertebral artery Mediastinum/Nodes: No significant mediastinal or axillary adenopathy is present. The esophagus is unremarkable. Secretions versus small tracheal nodules are evident. Lungs/Pleura: Centrilobular emphysema is present. No focal nodule or mass lesion is present. Mild dependent atelectasis is present bilaterally. There are small bilateral pleural effusions. Upper Abdomen: Liver metastases are again noted. Musculoskeletal: No focal lytic or blastic lesions are present. Vertebral body heights alignment are maintained. Review of the MIP images confirms the above findings. IMPRESSION: 1. No pulmonary embolus. 2. Centrilobular emphysema. 3. Secretions versus small tracheal nodules. Short-term follow-up CT scan of or bronchoscopy would be useful for further evaluation. 4. Aortic atherosclerosis. 5. Liver metastases. Electronically Signed: By: San Morelle M.D. On: 01/22/2017 09:53   Mr Abdomen Wwo Contrast  Result Date: 01/15/2017 CLINICAL DATA:  Evaluate liver lesions noted on ultrasound. EXAM: MRI ABDOMEN WITHOUT AND WITH CONTRAST TECHNIQUE: Multiplanar multisequence MR imaging of the abdomen was performed both before and after the administration of  intravenous contrast. CONTRAST:  39m MULTIHANCE GADOBENATE DIMEGLUMINE 529 MG/ML IV SOLN COMPARISON:  None. FINDINGS: Lower chest: No acute findings. Hepatobiliary: The liver is enlarged containing multiple ill defined heterogeneous and peripheral enhancing lesions worrisome for multifocal metastatic disease. The largest lesion is in the medial segment of left lobe measuring 6 cm, image number 26 of series 6. Lesion within segment 8 measures 3.2  cm, image number 8 of series 6. Segment 5 lesion measures 3.1 cm, image 31 of series 6. The gallbladder fundus extends into the large mass within the medial segment of left lobe, image number 65 of series 1401. No biliary dilatation. Pancreas:  No inflammation or mass. Spleen:  Within normal limits in size and appearance. Adrenals/Urinary Tract: No masses identified. No evidence of hydronephrosis. Stomach/Bowel: The stomach is normal. The small bowel loops have a normal course and caliber. Visualized portions of the colon are unremarkable. Vascular/Lymphatic: Normal appearance of the abdominal aorta. Aortic atherosclerosis noted. Periaortic node is prominent measuring 8 mm, image number 50 of series 1401. Necrotic appearing portacaval node measures 1.5 cm, image 55 of series 1405. Porta hepatis node measures 1.3 cm, image 49 of series 1404 and has mass effect upon the extrahepatic portal vein. Other:  None. Musculoskeletal: No suspicious bone lesions identified. IMPRESSION: 1. Examination is positive for multifocal heterogeneous enhancing liver lesions worrisome for diffuse hepatic metastasis. Correlation with tissue sampling is advised. The dominant mass appears contiguous with the gallbladder fundus. Cannot rule out gallbladder carcinoma or other GI primary. 2. Enlarged portacaval and porta hepatic lymph nodes worrisome for metastatic adenopathy. Electronically Signed   By: Kerby Moors M.D.   On: 01/15/2017 16:09   Ir Angiogram Visceral Selective  Result Date:  01/19/2017 INDICATION: Acute abdominal pain with acute perihepatic hemorrhage 2 hours status post percutaneous left hepatic metastasis biopsy. EXAM: SELECTIVE VISCERAL ARTERIOGRAPHY; IR EMBO ART VEN HEMORR LYMPH EXTRAV INC GUIDE ROADMAPPING; ADDITIONAL ARTERIOGRAPHY; IR ULTRASOUND GUIDANCE VASC ACCESS RIGHT MEDICATIONS: 1% lidocaine locally. ANESTHESIA/SEDATION: Moderate (conscious) sedation was employed during this procedure. A total of Versed 1.0 mg and Fentanyl 25 mcg was administered intravenously. Moderate Sedation Time: 50 minutes. The patient's level of consciousness and vital signs were monitored continuously by radiology nursing throughout the procedure under my direct supervision. CONTRAST:  1 ISOVUE-300 IOPAMIDOL (ISOVUE-300) INJECTION 61%, 1 ISOVUE-300 IOPAMIDOL (ISOVUE-300) INJECTION 61% FLUOROSCOPY TIME:  Fluoroscopy Time: 14 minutes 12 seconds (144 mGy). COMPLICATIONS: None immediate. PROCEDURE: Informed consent was obtained from the patient following explanation of the procedure, risks, benefits and alternatives. The patient understands, agrees and consents for the procedure. All questions were addressed. A time out was performed prior to the initiation of the procedure. Maximal barrier sterile technique utilized including caps, mask, sterile gowns, sterile gloves, large sterile drape, hand hygiene, and Betadine prep. Under sterile conditions and local anesthesia, ultrasound micropuncture access performed of the right common femoral artery. Five French sheath inserted. SOS catheter was utilized to select the celiac origin. Celiac angiogram performed. Celiac: Celiac origin is widely patent. Splenic, common hepatic, gastroduodenal artery, proper hepatic, right and left hepatic arteries are all patent. Irregularity of the hepatic vasculature is secondary to hepatomegaly and diffuse hepatic metastases. No active bleeding demonstrated. On the venous phase, the splenic vein and portal vein are all patent.  Through the [place] a 5 French catheter, a Renegade STC microcatheter was advanced over a micro glidewire into the proper hepatic artery. Proper hepatic angiogram: Proper hepatic, right and left hepatic arteries are patent. Vasospasm present in the proximal right hepatic artery related to wire manipulation. Limited assessment of the vasculature because of the contrast injection and poor opacification of the left hepatic artery. Over a double angled Glidewire, the microcatheter was advanced into the left hepatic artery. Left hepatic angiogram performed. Left hepatic: Left hepatic artery is patent. This is the dominant supply to the left hepatic lobe lateral segment were the percutaneous biopsy was  performed. Peripheral irregularity of the arterial vasculature related to hepatic metastases. No active extravasation, acute bleeding, or other acute vascular abnormality including AV fistula or pseudoaneurysm. Because of the significant recent acute perihepatic hemorrhage and a biopsy performed in the left hepatic lobe lateral segment, Gel-Foam embolization of left hepatic artery will be performed. Left hepatic artery Gel-Foam embolization: Through the microcatheter, approximately 5 cc of Gel-Foam slurry was instilled into left hepatic artery. Following embolization, there is significant reduction in the left hepatic arterial peripheral vasculature and sluggish flow but the left hepatic main artery remains patent. Catheter was retracted and advanced into a left hepatic branch supplying the medial segment. Additional left hepatic angiogram performed. Additional left hepatic angiogram: Irregularity of the parenchymal staining related to diffuse hepatic metastases. Scattered peripheral occlusions present in the additional left hepatic branch related to the Gel-Foam embolization. Vasospasm noted at the catheter site. No active bleeding demonstrated. Microcatheter was removed. A final angiogram was performed through the SOS 5  French catheter of the celiac origin. Celiac: Following left hepatic Gel-Foam embolization, there is significant reduction in the arterial vascularity to the the left hepatic lobe where the biopsy was performed. Residual vasospasm present in the proximal left hepatic artery. No other significant acute vascular process. Catheter was retracted and advanced into the SMA. SMA angiogram performed. SMA angiogram: SMA is widely patent. No variant SMA supply demonstrated to the liver. On the venous phase, the superior mesenteric vein and portal vein all remain patent. Access removed. Hemostasis obtained with manual compression of the right common femoral artery puncture site. Patient tolerated the procedure well. No immediate complication. IMPRESSION: Successful celiac, hepatic, and SMA angiograms as above. Successful micro catheterization, angiograms, and Gel-Foam embolization of the left hepatic artery resulting in significant reduction in arterial flow to the area the liver were the percutaneous biopsy was performed. Electronically Signed   By: Jerilynn Mages.  Shick M.D.   On: 01/19/2017 10:01   Ir Angiogram Visceral Selective  Result Date: 01/19/2017 INDICATION: Acute abdominal pain with acute perihepatic hemorrhage 2 hours status post percutaneous left hepatic metastasis biopsy. EXAM: SELECTIVE VISCERAL ARTERIOGRAPHY; IR EMBO ART VEN HEMORR LYMPH EXTRAV INC GUIDE ROADMAPPING; ADDITIONAL ARTERIOGRAPHY; IR ULTRASOUND GUIDANCE VASC ACCESS RIGHT MEDICATIONS: 1% lidocaine locally. ANESTHESIA/SEDATION: Moderate (conscious) sedation was employed during this procedure. A total of Versed 1.0 mg and Fentanyl 25 mcg was administered intravenously. Moderate Sedation Time: 50 minutes. The patient's level of consciousness and vital signs were monitored continuously by radiology nursing throughout the procedure under my direct supervision. CONTRAST:  1 ISOVUE-300 IOPAMIDOL (ISOVUE-300) INJECTION 61%, 1 ISOVUE-300 IOPAMIDOL (ISOVUE-300)  INJECTION 61% FLUOROSCOPY TIME:  Fluoroscopy Time: 14 minutes 12 seconds (144 mGy). COMPLICATIONS: None immediate. PROCEDURE: Informed consent was obtained from the patient following explanation of the procedure, risks, benefits and alternatives. The patient understands, agrees and consents for the procedure. All questions were addressed. A time out was performed prior to the initiation of the procedure. Maximal barrier sterile technique utilized including caps, mask, sterile gowns, sterile gloves, large sterile drape, hand hygiene, and Betadine prep. Under sterile conditions and local anesthesia, ultrasound micropuncture access performed of the right common femoral artery. Five French sheath inserted. SOS catheter was utilized to select the celiac origin. Celiac angiogram performed. Celiac: Celiac origin is widely patent. Splenic, common hepatic, gastroduodenal artery, proper hepatic, right and left hepatic arteries are all patent. Irregularity of the hepatic vasculature is secondary to hepatomegaly and diffuse hepatic metastases. No active bleeding demonstrated. On the venous phase, the splenic vein and  portal vein are all patent. Through the SOS a 5 French catheter, a Renegade STC microcatheter was advanced over a micro glidewire into the proper hepatic artery. Proper hepatic angiogram: Proper hepatic, right and left hepatic arteries are patent. Vasospasm present in the proximal right hepatic artery related to wire manipulation. Limited assessment of the vasculature because of the contrast injection and poor opacification of the left hepatic artery. Over a double angled Glidewire, the microcatheter was advanced into the left hepatic artery. Left hepatic angiogram performed. Left hepatic: Left hepatic artery is patent. This is the dominant supply to the left hepatic lobe lateral segment were the percutaneous biopsy was performed. Peripheral irregularity of the arterial vasculature related to hepatic metastases. No  active extravasation, acute bleeding, or other acute vascular abnormality including AV fistula or pseudoaneurysm. Because of the significant recent acute perihepatic hemorrhage and a biopsy performed in the left hepatic lobe lateral segment, Gel-Foam embolization of left hepatic artery will be performed. Left hepatic artery Gel-Foam embolization: Through the microcatheter, approximately 5 cc of Gel-Foam slurry was instilled into left hepatic artery. Following embolization, there is significant reduction in the left hepatic arterial peripheral vasculature and sluggish flow but the left hepatic main artery remains patent. Catheter was retracted and advanced into a left hepatic branch supplying the medial segment. Additional left hepatic angiogram performed. Additional left hepatic angiogram: Irregularity of the parenchymal staining related to diffuse hepatic metastases. Scattered peripheral occlusions present in the additional left hepatic branch related to the Gel-Foam embolization. Vasospasm noted at the catheter site. No active bleeding demonstrated. Microcatheter was removed. A final angiogram was performed through the SOS 5 French catheter of the celiac origin. Celiac: Following left hepatic Gel-Foam embolization, there is significant reduction in the arterial vascularity to the the left hepatic lobe where the biopsy was performed. Residual vasospasm present in the proximal left hepatic artery. No other significant acute vascular process. Catheter was retracted and advanced into the SMA. SMA angiogram performed. SMA angiogram: SMA is widely patent. No variant SMA supply demonstrated to the liver. On the venous phase, the superior mesenteric vein and portal vein all remain patent. Access removed. Hemostasis obtained with manual compression of the right common femoral artery puncture site. Patient tolerated the procedure well. No immediate complication. IMPRESSION: Successful celiac, hepatic, and SMA angiograms as  above. Successful micro catheterization, angiograms, and Gel-Foam embolization of the left hepatic artery resulting in significant reduction in arterial flow to the area the liver were the percutaneous biopsy was performed. Electronically Signed   By: Judie Petit.  Shick M.D.   On: 01/19/2017 10:01   Ir Angiogram Selective Each Additional Vessel  Result Date: 01/19/2017 INDICATION: Acute abdominal pain with acute perihepatic hemorrhage 2 hours status post percutaneous left hepatic metastasis biopsy. EXAM: SELECTIVE VISCERAL ARTERIOGRAPHY; IR EMBO ART VEN HEMORR LYMPH EXTRAV INC GUIDE ROADMAPPING; ADDITIONAL ARTERIOGRAPHY; IR ULTRASOUND GUIDANCE VASC ACCESS RIGHT MEDICATIONS: 1% lidocaine locally. ANESTHESIA/SEDATION: Moderate (conscious) sedation was employed during this procedure. A total of Versed 1.0 mg and Fentanyl 25 mcg was administered intravenously. Moderate Sedation Time: 50 minutes. The patient's level of consciousness and vital signs were monitored continuously by radiology nursing throughout the procedure under my direct supervision. CONTRAST:  1 ISOVUE-300 IOPAMIDOL (ISOVUE-300) INJECTION 61%, 1 ISOVUE-300 IOPAMIDOL (ISOVUE-300) INJECTION 61% FLUOROSCOPY TIME:  Fluoroscopy Time: 14 minutes 12 seconds (144 mGy). COMPLICATIONS: None immediate. PROCEDURE: Informed consent was obtained from the patient following explanation of the procedure, risks, benefits and alternatives. The patient understands, agrees and consents for the procedure. All  questions were addressed. A time out was performed prior to the initiation of the procedure. Maximal barrier sterile technique utilized including caps, mask, sterile gowns, sterile gloves, large sterile drape, hand hygiene, and Betadine prep. Under sterile conditions and local anesthesia, ultrasound micropuncture access performed of the right common femoral artery. Five French sheath inserted. SOS catheter was utilized to select the celiac origin. Celiac angiogram performed.  Celiac: Celiac origin is widely patent. Splenic, common hepatic, gastroduodenal artery, proper hepatic, right and left hepatic arteries are all patent. Irregularity of the hepatic vasculature is secondary to hepatomegaly and diffuse hepatic metastases. No active bleeding demonstrated. On the venous phase, the splenic vein and portal vein are all patent. Through the [place] a 5 French catheter, a Renegade STC microcatheter was advanced over a micro glidewire into the proper hepatic artery. Proper hepatic angiogram: Proper hepatic, right and left hepatic arteries are patent. Vasospasm present in the proximal right hepatic artery related to wire manipulation. Limited assessment of the vasculature because of the contrast injection and poor opacification of the left hepatic artery. Over a double angled Glidewire, the microcatheter was advanced into the left hepatic artery. Left hepatic angiogram performed. Left hepatic: Left hepatic artery is patent. This is the dominant supply to the left hepatic lobe lateral segment were the percutaneous biopsy was performed. Peripheral irregularity of the arterial vasculature related to hepatic metastases. No active extravasation, acute bleeding, or other acute vascular abnormality including AV fistula or pseudoaneurysm. Because of the significant recent acute perihepatic hemorrhage and a biopsy performed in the left hepatic lobe lateral segment, Gel-Foam embolization of left hepatic artery will be performed. Left hepatic artery Gel-Foam embolization: Through the microcatheter, approximately 5 cc of Gel-Foam slurry was instilled into left hepatic artery. Following embolization, there is significant reduction in the left hepatic arterial peripheral vasculature and sluggish flow but the left hepatic main artery remains patent. Catheter was retracted and advanced into a left hepatic branch supplying the medial segment. Additional left hepatic angiogram performed. Additional left hepatic  angiogram: Irregularity of the parenchymal staining related to diffuse hepatic metastases. Scattered peripheral occlusions present in the additional left hepatic branch related to the Gel-Foam embolization. Vasospasm noted at the catheter site. No active bleeding demonstrated. Microcatheter was removed. A final angiogram was performed through the SOS 5 French catheter of the celiac origin. Celiac: Following left hepatic Gel-Foam embolization, there is significant reduction in the arterial vascularity to the the left hepatic lobe where the biopsy was performed. Residual vasospasm present in the proximal left hepatic artery. No other significant acute vascular process. Catheter was retracted and advanced into the SMA. SMA angiogram performed. SMA angiogram: SMA is widely patent. No variant SMA supply demonstrated to the liver. On the venous phase, the superior mesenteric vein and portal vein all remain patent. Access removed. Hemostasis obtained with manual compression of the right common femoral artery puncture site. Patient tolerated the procedure well. No immediate complication. IMPRESSION: Successful celiac, hepatic, and SMA angiograms as above. Successful micro catheterization, angiograms, and Gel-Foam embolization of the left hepatic artery resulting in significant reduction in arterial flow to the area the liver were the percutaneous biopsy was performed. Electronically Signed   By: Jerilynn Mages.  Shick M.D.   On: 01/19/2017 10:01   Ir Angiogram Selective Each Additional Vessel  Result Date: 01/19/2017 INDICATION: Acute abdominal pain with acute perihepatic hemorrhage 2 hours status post percutaneous left hepatic metastasis biopsy. EXAM: SELECTIVE VISCERAL ARTERIOGRAPHY; IR EMBO ART VEN HEMORR LYMPH EXTRAV INC GUIDE ROADMAPPING; ADDITIONAL ARTERIOGRAPHY;  IR ULTRASOUND GUIDANCE VASC ACCESS RIGHT MEDICATIONS: 1% lidocaine locally. ANESTHESIA/SEDATION: Moderate (conscious) sedation was employed during this procedure. A  total of Versed 1.0 mg and Fentanyl 25 mcg was administered intravenously. Moderate Sedation Time: 50 minutes. The patient's level of consciousness and vital signs were monitored continuously by radiology nursing throughout the procedure under my direct supervision. CONTRAST:  1 ISOVUE-300 IOPAMIDOL (ISOVUE-300) INJECTION 61%, 1 ISOVUE-300 IOPAMIDOL (ISOVUE-300) INJECTION 61% FLUOROSCOPY TIME:  Fluoroscopy Time: 14 minutes 12 seconds (144 mGy). COMPLICATIONS: None immediate. PROCEDURE: Informed consent was obtained from the patient following explanation of the procedure, risks, benefits and alternatives. The patient understands, agrees and consents for the procedure. All questions were addressed. A time out was performed prior to the initiation of the procedure. Maximal barrier sterile technique utilized including caps, mask, sterile gowns, sterile gloves, large sterile drape, hand hygiene, and Betadine prep. Under sterile conditions and local anesthesia, ultrasound micropuncture access performed of the right common femoral artery. Five French sheath inserted. SOS catheter was utilized to select the celiac origin. Celiac angiogram performed. Celiac: Celiac origin is widely patent. Splenic, common hepatic, gastroduodenal artery, proper hepatic, right and left hepatic arteries are all patent. Irregularity of the hepatic vasculature is secondary to hepatomegaly and diffuse hepatic metastases. No active bleeding demonstrated. On the venous phase, the splenic vein and portal vein are all patent. Through the [place] a 5 French catheter, a Renegade STC microcatheter was advanced over a micro glidewire into the proper hepatic artery. Proper hepatic angiogram: Proper hepatic, right and left hepatic arteries are patent. Vasospasm present in the proximal right hepatic artery related to wire manipulation. Limited assessment of the vasculature because of the contrast injection and poor opacification of the left hepatic artery. Over  a double angled Glidewire, the microcatheter was advanced into the left hepatic artery. Left hepatic angiogram performed. Left hepatic: Left hepatic artery is patent. This is the dominant supply to the left hepatic lobe lateral segment were the percutaneous biopsy was performed. Peripheral irregularity of the arterial vasculature related to hepatic metastases. No active extravasation, acute bleeding, or other acute vascular abnormality including AV fistula or pseudoaneurysm. Because of the significant recent acute perihepatic hemorrhage and a biopsy performed in the left hepatic lobe lateral segment, Gel-Foam embolization of left hepatic artery will be performed. Left hepatic artery Gel-Foam embolization: Through the microcatheter, approximately 5 cc of Gel-Foam slurry was instilled into left hepatic artery. Following embolization, there is significant reduction in the left hepatic arterial peripheral vasculature and sluggish flow but the left hepatic main artery remains patent. Catheter was retracted and advanced into a left hepatic branch supplying the medial segment. Additional left hepatic angiogram performed. Additional left hepatic angiogram: Irregularity of the parenchymal staining related to diffuse hepatic metastases. Scattered peripheral occlusions present in the additional left hepatic branch related to the Gel-Foam embolization. Vasospasm noted at the catheter site. No active bleeding demonstrated. Microcatheter was removed. A final angiogram was performed through the SOS 5 French catheter of the celiac origin. Celiac: Following left hepatic Gel-Foam embolization, there is significant reduction in the arterial vascularity to the the left hepatic lobe where the biopsy was performed. Residual vasospasm present in the proximal left hepatic artery. No other significant acute vascular process. Catheter was retracted and advanced into the SMA. SMA angiogram performed. SMA angiogram: SMA is widely patent. No  variant SMA supply demonstrated to the liver. On the venous phase, the superior mesenteric vein and portal vein all remain patent. Access removed. Hemostasis obtained with manual compression  of the right common femoral artery puncture site. Patient tolerated the procedure well. No immediate complication. IMPRESSION: Successful celiac, hepatic, and SMA angiograms as above. Successful micro catheterization, angiograms, and Gel-Foam embolization of the left hepatic artery resulting in significant reduction in arterial flow to the area the liver were the percutaneous biopsy was performed. Electronically Signed   By: Jerilynn Mages.  Shick M.D.   On: 01/19/2017 10:01   Ir Angiogram Selective Each Additional Vessel  Result Date: 01/19/2017 INDICATION: Acute abdominal pain with acute perihepatic hemorrhage 2 hours status post percutaneous left hepatic metastasis biopsy. EXAM: SELECTIVE VISCERAL ARTERIOGRAPHY; IR EMBO ART VEN HEMORR LYMPH EXTRAV INC GUIDE ROADMAPPING; ADDITIONAL ARTERIOGRAPHY; IR ULTRASOUND GUIDANCE VASC ACCESS RIGHT MEDICATIONS: 1% lidocaine locally. ANESTHESIA/SEDATION: Moderate (conscious) sedation was employed during this procedure. A total of Versed 1.0 mg and Fentanyl 25 mcg was administered intravenously. Moderate Sedation Time: 50 minutes. The patient's level of consciousness and vital signs were monitored continuously by radiology nursing throughout the procedure under my direct supervision. CONTRAST:  1 ISOVUE-300 IOPAMIDOL (ISOVUE-300) INJECTION 61%, 1 ISOVUE-300 IOPAMIDOL (ISOVUE-300) INJECTION 61% FLUOROSCOPY TIME:  Fluoroscopy Time: 14 minutes 12 seconds (144 mGy). COMPLICATIONS: None immediate. PROCEDURE: Informed consent was obtained from the patient following explanation of the procedure, risks, benefits and alternatives. The patient understands, agrees and consents for the procedure. All questions were addressed. A time out was performed prior to the initiation of the procedure. Maximal barrier  sterile technique utilized including caps, mask, sterile gowns, sterile gloves, large sterile drape, hand hygiene, and Betadine prep. Under sterile conditions and local anesthesia, ultrasound micropuncture access performed of the right common femoral artery. Five French sheath inserted. SOS catheter was utilized to select the celiac origin. Celiac angiogram performed. Celiac: Celiac origin is widely patent. Splenic, common hepatic, gastroduodenal artery, proper hepatic, right and left hepatic arteries are all patent. Irregularity of the hepatic vasculature is secondary to hepatomegaly and diffuse hepatic metastases. No active bleeding demonstrated. On the venous phase, the splenic vein and portal vein are all patent. Through the [place] a 5 French catheter, a Renegade STC microcatheter was advanced over a micro glidewire into the proper hepatic artery. Proper hepatic angiogram: Proper hepatic, right and left hepatic arteries are patent. Vasospasm present in the proximal right hepatic artery related to wire manipulation. Limited assessment of the vasculature because of the contrast injection and poor opacification of the left hepatic artery. Over a double angled Glidewire, the microcatheter was advanced into the left hepatic artery. Left hepatic angiogram performed. Left hepatic: Left hepatic artery is patent. This is the dominant supply to the left hepatic lobe lateral segment were the percutaneous biopsy was performed. Peripheral irregularity of the arterial vasculature related to hepatic metastases. No active extravasation, acute bleeding, or other acute vascular abnormality including AV fistula or pseudoaneurysm. Because of the significant recent acute perihepatic hemorrhage and a biopsy performed in the left hepatic lobe lateral segment, Gel-Foam embolization of left hepatic artery will be performed. Left hepatic artery Gel-Foam embolization: Through the microcatheter, approximately 5 cc of Gel-Foam slurry was  instilled into left hepatic artery. Following embolization, there is significant reduction in the left hepatic arterial peripheral vasculature and sluggish flow but the left hepatic main artery remains patent. Catheter was retracted and advanced into a left hepatic branch supplying the medial segment. Additional left hepatic angiogram performed. Additional left hepatic angiogram: Irregularity of the parenchymal staining related to diffuse hepatic metastases. Scattered peripheral occlusions present in the additional left hepatic branch related to the Gel-Foam embolization. Vasospasm  noted at the catheter site. No active bleeding demonstrated. Microcatheter was removed. A final angiogram was performed through the SOS 5 French catheter of the celiac origin. Celiac: Following left hepatic Gel-Foam embolization, there is significant reduction in the arterial vascularity to the the left hepatic lobe where the biopsy was performed. Residual vasospasm present in the proximal left hepatic artery. No other significant acute vascular process. Catheter was retracted and advanced into the SMA. SMA angiogram performed. SMA angiogram: SMA is widely patent. No variant SMA supply demonstrated to the liver. On the venous phase, the superior mesenteric vein and portal vein all remain patent. Access removed. Hemostasis obtained with manual compression of the right common femoral artery puncture site. Patient tolerated the procedure well. No immediate complication. IMPRESSION: Successful celiac, hepatic, and SMA angiograms as above. Successful micro catheterization, angiograms, and Gel-Foam embolization of the left hepatic artery resulting in significant reduction in arterial flow to the area the liver were the percutaneous biopsy was performed. Electronically Signed   By: Jerilynn Mages.  Shick M.D.   On: 01/19/2017 10:01   US Biopsy  Result Date: 01/18/2017 INDICATION: LIVER METASTASES, UNKNOWN PRIMARY. EXAM: ULTRASOUND BIOPSY CORE LIVER  MEDICATIONS: 1% LIDOCAINE ANESTHESIA/SEDATION: Moderate (conscious) sedation was employed during this procedure. A total of Versed 1.0 mg and Fentanyl 50 mcg was administered intravenously. Moderate Sedation Time: 10 minutes. The patient's level of consciousness and vital signs were monitored continuously by radiology nursing throughout the procedure under my direct supervision. FLUOROSCOPY TIME:  Fluoroscopy Time: NONE. COMPLICATIONS: None immediate. PROCEDURE: Informed written consent was obtained from the patient after a thorough discussion of the procedural risks, benefits and alternatives. All questions were addressed. Maximal Sterile Barrier Technique was utilized including caps, mask, sterile gowns, sterile gloves, sterile drape, hand hygiene and skin antiseptic. A timeout was performed prior to the initiation of the procedure. Previous imaging reviewed. Preliminary ultrasound performed. A solid left hepatic lobe lesion was localized from a subxiphoid window. This was correlated with the abdominal MRI. Overlying skin was marked. Under sterile conditions and local anesthesia, a 17 gauge 6.8 cm access needle was advanced from an anterior subxiphoid approach into the left hepatic mass. Needle position confirmed with ultrasound. Three 1 cm 18 gauge core biopsies obtained. Samples placed in formalin. These were intact and non fragmented. Needle tract embolized with Gel-Foam. Postprocedure imaging demonstrates no hemorrhage or hematoma. Patient tolerated the biopsy well. IMPRESSION: Successful ultrasound left hepatic mass 18 gauge core biopsy Electronically Signed   By: Jerilynn Mages.  Shick M.D.   On: 01/18/2017 14:53   Ir US Guide Vasc Access Right  Result Date: 01/19/2017 INDICATION: Acute abdominal pain with acute perihepatic hemorrhage 2 hours status post percutaneous left hepatic metastasis biopsy. EXAM: SELECTIVE VISCERAL ARTERIOGRAPHY; IR EMBO ART VEN HEMORR LYMPH EXTRAV INC GUIDE ROADMAPPING; ADDITIONAL  ARTERIOGRAPHY; IR ULTRASOUND GUIDANCE VASC ACCESS RIGHT MEDICATIONS: 1% lidocaine locally. ANESTHESIA/SEDATION: Moderate (conscious) sedation was employed during this procedure. A total of Versed 1.0 mg and Fentanyl 25 mcg was administered intravenously. Moderate Sedation Time: 50 minutes. The patient's level of consciousness and vital signs were monitored continuously by radiology nursing throughout the procedure under my direct supervision. CONTRAST:  1 ISOVUE-300 IOPAMIDOL (ISOVUE-300) INJECTION 61%, 1 ISOVUE-300 IOPAMIDOL (ISOVUE-300) INJECTION 61% FLUOROSCOPY TIME:  Fluoroscopy Time: 14 minutes 12 seconds (144 mGy). COMPLICATIONS: None immediate. PROCEDURE: Informed consent was obtained from the patient following explanation of the procedure, risks, benefits and alternatives. The patient understands, agrees and consents for the procedure. All questions were addressed. A time out was  performed prior to the initiation of the procedure. Maximal barrier sterile technique utilized including caps, mask, sterile gowns, sterile gloves, large sterile drape, hand hygiene, and Betadine prep. Under sterile conditions and local anesthesia, ultrasound micropuncture access performed of the right common femoral artery. Five French sheath inserted. SOS catheter was utilized to select the celiac origin. Celiac angiogram performed. Celiac: Celiac origin is widely patent. Splenic, common hepatic, gastroduodenal artery, proper hepatic, right and left hepatic arteries are all patent. Irregularity of the hepatic vasculature is secondary to hepatomegaly and diffuse hepatic metastases. No active bleeding demonstrated. On the venous phase, the splenic vein and portal vein are all patent. Through the [place] a 5 French catheter, a Renegade STC microcatheter was advanced over a micro glidewire into the proper hepatic artery. Proper hepatic angiogram: Proper hepatic, right and left hepatic arteries are patent. Vasospasm present in the proximal  right hepatic artery related to wire manipulation. Limited assessment of the vasculature because of the contrast injection and poor opacification of the left hepatic artery. Over a double angled Glidewire, the microcatheter was advanced into the left hepatic artery. Left hepatic angiogram performed. Left hepatic: Left hepatic artery is patent. This is the dominant supply to the left hepatic lobe lateral segment were the percutaneous biopsy was performed. Peripheral irregularity of the arterial vasculature related to hepatic metastases. No active extravasation, acute bleeding, or other acute vascular abnormality including AV fistula or pseudoaneurysm. Because of the significant recent acute perihepatic hemorrhage and a biopsy performed in the left hepatic lobe lateral segment, Gel-Foam embolization of left hepatic artery will be performed. Left hepatic artery Gel-Foam embolization: Through the microcatheter, approximately 5 cc of Gel-Foam slurry was instilled into left hepatic artery. Following embolization, there is significant reduction in the left hepatic arterial peripheral vasculature and sluggish flow but the left hepatic main artery remains patent. Catheter was retracted and advanced into a left hepatic branch supplying the medial segment. Additional left hepatic angiogram performed. Additional left hepatic angiogram: Irregularity of the parenchymal staining related to diffuse hepatic metastases. Scattered peripheral occlusions present in the additional left hepatic branch related to the Gel-Foam embolization. Vasospasm noted at the catheter site. No active bleeding demonstrated. Microcatheter was removed. A final angiogram was performed through the SOS 5 French catheter of the celiac origin. Celiac: Following left hepatic Gel-Foam embolization, there is significant reduction in the arterial vascularity to the the left hepatic lobe where the biopsy was performed. Residual vasospasm present in the proximal left  hepatic artery. No other significant acute vascular process. Catheter was retracted and advanced into the SMA. SMA angiogram performed. SMA angiogram: SMA is widely patent. No variant SMA supply demonstrated to the liver. On the venous phase, the superior mesenteric vein and portal vein all remain patent. Access removed. Hemostasis obtained with manual compression of the right common femoral artery puncture site. Patient tolerated the procedure well. No immediate complication. IMPRESSION: Successful celiac, hepatic, and SMA angiograms as above. Successful micro catheterization, angiograms, and Gel-Foam embolization of the left hepatic artery resulting in significant reduction in arterial flow to the area the liver were the percutaneous biopsy was performed. Electronically Signed   By: Jerilynn Mages.  Shick M.D.   On: 01/19/2017 10:01   Dg Chest Port 1v Same Day  Result Date: 01/18/2017 CLINICAL DATA:  Acute onset of shortness of breath, vomiting and epigastric abdominal pain. Fever. Initial encounter. EXAM: PORTABLE CHEST 1 VIEW COMPARISON:  Chest radiograph performed 01/16/2017 FINDINGS: The lungs are hyperexpanded, with flattening of the hemidiaphragms, compatible with  COPD. There is no evidence of focal opacification, pleural effusion or pneumothorax. The cardiomediastinal silhouette is within normal limits. No acute osseous abnormalities are seen. IMPRESSION: Findings of COPD.  Lungs remain otherwise clear. Electronically Signed   By: Rachel Butler M.D.   On: 01/18/2017 23:48   Dg Abd Portable 1v  Result Date: 01/18/2017 CLINICAL DATA:  Acute onset of shortness of breath, vomiting and epigastric abdominal pain. Recent liver biopsy. Fever. Initial encounter. EXAM: PORTABLE ABDOMEN - 1 VIEW COMPARISON:  None. FINDINGS: The visualized bowel gas pattern is unremarkable. Scattered air and stool filled loops of colon are seen; no abnormal dilatation of small bowel loops is seen to suggest small bowel obstruction. No free  intra-abdominal air is identified, though evaluation for free air is limited on a single supine view. The stomach is largely filled with air. The visualized osseous structures are within normal limits; the sacroiliac joints are unremarkable in appearance. IMPRESSION: Unremarkable bowel gas pattern; no free intra-abdominal air seen. Moderate amount of stool noted in the colon. Electronically Signed   By: Rachel Butler M.D.   On: 01/18/2017 23:50   Ir Embo Art  [facility] [employer] Guide Roadmapping  Result Date: 01/19/2017 INDICATION: Acute abdominal pain with acute perihepatic hemorrhage 2 hours status post percutaneous left hepatic metastasis biopsy. EXAM: SELECTIVE VISCERAL ARTERIOGRAPHY; IR EMBO ART VEN HEMORR LYMPH EXTRAV INC GUIDE ROADMAPPING; ADDITIONAL ARTERIOGRAPHY; IR ULTRASOUND GUIDANCE VASC ACCESS RIGHT MEDICATIONS: 1% lidocaine locally. ANESTHESIA/SEDATION: Moderate (conscious) sedation was employed during this procedure. A total of Versed 1.0 mg and Fentanyl 25 mcg was administered intravenously. Moderate Sedation Time: 50 minutes. The patient's level of consciousness and vital signs were monitored continuously by radiology nursing throughout the procedure under my direct supervision. CONTRAST:  1 ISOVUE-300 IOPAMIDOL (ISOVUE-300) INJECTION 61%, 1 ISOVUE-300 IOPAMIDOL (ISOVUE-300) INJECTION 61% FLUOROSCOPY TIME:  Fluoroscopy Time: 14 minutes 12 seconds (144 mGy). COMPLICATIONS: None immediate. PROCEDURE: Informed consent was obtained from the patient following explanation of the procedure, risks, benefits and alternatives. The patient understands, agrees and consents for the procedure. All questions were addressed. A time out was performed prior to the initiation of the procedure. Maximal barrier sterile technique utilized including caps, mask, sterile gowns, sterile gloves, large sterile drape, hand hygiene, and Betadine prep. Under sterile conditions and local anesthesia, ultrasound  micropuncture access performed of the right common femoral artery. Five French sheath inserted. SOS catheter was utilized to select the celiac origin. Celiac angiogram performed. Celiac: Celiac origin is widely patent. Splenic, common hepatic, gastroduodenal artery, proper hepatic, right and left hepatic arteries are all patent. Irregularity of the hepatic vasculature is secondary to hepatomegaly and diffuse hepatic metastases. No active bleeding demonstrated. On the venous phase, the splenic vein and portal vein are all patent. Through the SOS a 5 French catheter, a Renegade STC microcatheter was advanced over a micro glidewire into the proper hepatic artery. Proper hepatic angiogram: Proper hepatic, right and left hepatic arteries are patent. Vasospasm present in the proximal right hepatic artery related to wire manipulation. Limited assessment of the vasculature because of the contrast injection and poor opacification of the left hepatic artery. Over a double angled Glidewire, the microcatheter was advanced into the left hepatic artery. Left hepatic angiogram performed. Left hepatic: Left hepatic artery is patent. This is the dominant supply to the left hepatic lobe lateral segment were the percutaneous biopsy was performed. Peripheral irregularity of the arterial vasculature related to hepatic metastases. No active extravasation, acute bleeding, or other acute  vascular abnormality including AV fistula or pseudoaneurysm. Because of the significant recent acute perihepatic hemorrhage and a biopsy performed in the left hepatic lobe lateral segment, Gel-Foam embolization of left hepatic artery will be performed. Left hepatic artery Gel-Foam embolization: Through the microcatheter, approximately 5 cc of Gel-Foam slurry was instilled into left hepatic artery. Following embolization, there is significant reduction in the left hepatic arterial peripheral vasculature and sluggish flow but the left hepatic main artery  remains patent. Catheter was retracted and advanced into a left hepatic branch supplying the medial segment. Additional left hepatic angiogram performed. Additional left hepatic angiogram: Irregularity of the parenchymal staining related to diffuse hepatic metastases. Scattered peripheral occlusions present in the additional left hepatic branch related to the Gel-Foam embolization. Vasospasm noted at the catheter site. No active bleeding demonstrated. Microcatheter was removed. A final angiogram was performed through the SOS 5 French catheter of the celiac origin. Celiac: Following left hepatic Gel-Foam embolization, there is significant reduction in the arterial vascularity to the the left hepatic lobe where the biopsy was performed. Residual vasospasm present in the proximal left hepatic artery. No other significant acute vascular process. Catheter was retracted and advanced into the SMA. SMA angiogram performed. SMA angiogram: SMA is widely patent. No variant SMA supply demonstrated to the liver. On the venous phase, the superior mesenteric vein and portal vein all remain patent. Access removed. Hemostasis obtained with manual compression of the right common femoral artery puncture site. Patient tolerated the procedure well. No immediate complication. IMPRESSION: Successful celiac, hepatic, and SMA angiograms as above. Successful micro catheterization, angiograms, and Gel-Foam embolization of the left hepatic artery resulting in significant reduction in arterial flow to the area the liver were the percutaneous biopsy was performed. Electronically Signed   By: Jerilynn Mages.  Shick M.D.   On: 01/19/2017 10:01   Ct Angio Abd/pel W/ And/or W/o  Result Date: 01/20/2017 CLINICAL DATA:  Hepatic metastases, status post ultrasound percutaneous biopsy which was complicated by acute bleeding 2 hours after the biopsy. Patient subsequently underwent Gel-Foam embolization of left hepatic artery. Despite transfusions, further decrease  in hemoglobin this morning. EXAM: CT ANGIOGRAPHY ABDOMEN AND PELVIS TECHNIQUE: Multidetector CT imaging of the abdomen and pelvis was performed using the standard protocol during bolus administration of intravenous contrast. Multiplanar reconstructed images including MIPs were obtained and reviewed to evaluate the vascular anatomy. CONTRAST:  100 cc Isovue COMPARISON:  01/18/2017 FINDINGS: Arterial findings: Aorta: Scattered moderate atherosclerosis of the aorta, most pronounced in the infrarenal aorta without occlusion, aneurysm or dissection. No retroperitoneal hemorrhage. Celiac axis: Atherosclerotic origin but remains patent. Splenic, left gastric, and hepatic branches appear patent. No active bleeding or acute vascular process. Superior mesenteric: Atherosclerotic origin but remains patent. SMA branches appear patent. No active bleeding. Left renal:           Mild atherosclerosis but widely patent. Right renal:          Mild atherosclerosis but widely patent. Inferior mesenteric:  Atherosclerotic origin but remains patent. Left iliac: Iliac atherosclerosis noted without occlusion. Left common, internal and external iliac arteries are patent. Right iliac: Similar atherosclerosis without occlusion. Right common, internal and external iliac arteries are patent. Venous findings: Hepatic veins, portal, splenic, mesenteric veins are patent. No veno-occlusive process. IVC, renal veins common iliac veins are patent. Review of the MIP images confirms the above findings. Nonvascular findings: Basilar emphysema noted. Minor atelectasis. Normal heart size. Trace pleural effusions. No pericardial effusion. Hypodense peripherally enhancing hepatic masses again evident compatible with metastases. No  evidence of active bleeding or acute vascular process within the liver. No biliary dilatation. Gallbladder is collapsed but contains partially calcified gallstones. Moderate Heterogeneous perihepatic free fluid, compatible with a  mixture of ascites and known hemoperitoneum related to the recent left hepatic mass biopsy. Spleen is unremarkable. Small amount of hemoperitoneum about the spleen upper pole. Adrenal glands, kidneys, ureters, and bladder demonstrate no acute process, obstruction or hydronephrosis. Negative for bowel obstruction, significant dilatation, ileus, or free air. No adenopathy Remote hysterectomy.  Pelvic ascites also evident. No inguinal abnormality or hernia. Intact abdominal wall. No rectus sheath hemorrhage or hematoma over the left hepatic biopsy site. Degenerative changes of the spine. Bones are mildly osteopenic. No acute osseous finding. IMPRESSION: Negative for acute or active intra-abdominal bleeding. Stable diffuse hepatic metastatic process. Stable perihepatic ascites and hemoperitoneum related to the recent left hepatic mass biopsy. Intact abdominal wall without rectus sheath hematoma or hemorrhage. Aortoiliac atherosclerosis Cholelithiasis Pelvic ascites as well These results were called by telephone at the time of interpretation on 01/20/2017 at 2:13 pm to Dr. Florencia Reasons , who verbally acknowledged these results. Electronically Signed   By: Jerilynn Mages.  Shick M.D.   On: 01/20/2017 14:15   US Abdomen Limited Ruq  Result Date: 01/18/2017 CLINICAL DATA:  61 year old female with a history of multiple liver masses, status post percutaneous biopsy today's date. Interval development of abdominal pain EXAM: US ABDOMEN LIMITED - RIGHT UPPER QUADRANT COMPARISON:  Ultrasound 01/18/2017, MRI 01/15/2017 FINDINGS: Gallbladder: Contracted gallbladder with echogenic focus in the lumen compatible with cholelithiasis. Common bile duct: Diameter: 4 mm -5 mm Liver: Re- demonstration of mass within right liver lobe 7 point far cm by 5.4 cm x 7.4 cm. The biopsy of the left liver lobe on the comparison ultrasound today's date demonstrates small echogenic focus, compatible with hemostatic material. There is hyperechoic free fluid adjacent  to the liver margin, both anterior and posterior to the left liver lobe. IMPRESSION: New hyperechoic free fluid adjacent to the left liver lobe, status post percutaneous biopsy, compatible with hemorrhage. Re- demonstration of liver masses, better characterized on prior MRI. These results were called by telephone at the time of interpretation on 01/18/2017 at 5:42 pm to Dr. Annamaria Boots who verbally acknowledged these results. Electronically Signed   By: Corrie Mckusick D.O.   On: 01/18/2017 17:43   US Abdomen Limited Ruq  Result Date: 01/11/2017 CLINICAL DATA:  Right upper quadrant pain . EXAM: US ABDOMEN LIMITED - RIGHT UPPER QUADRANT COMPARISON:  No recent prior . FINDINGS: Gallbladder: Multiple gallstones are noted. Gallstone stones measure up to 1.1 cm. Gallbladder wall is thickened at 1.1 cm. Cholecystitis cannot be excluded. Negative Murphy sign. Common bile duct: Diameter: 2.0 mm Liver: Multiple solid hepatic lesions are noted. Largest lesion measures 9.7 cm and is in the right hepatic lobe. These findings are highly suspicious for metastatic disease. A pancreatic head mass cannot be excluded. Appear pancreatic adenopathy cannot be excluded. Portal vein is narrowed most likely from periportal adenopathy. Portal vein however is patent. IMPRESSION: 1. Multiple gallstones. Gallbladder wall is thickened at 1.1 cm. Cholecystitis cannot be excluded. 2. Multiple solid hepatic lesions. The largest measures 9.7 cm. Pancreatic mass appears to be present. Pancreatic solid lesions noted most likely adenopathy. Portal vein is narrowed possibly from periportal adenopathy. Portal vein however is patent. These findings together suggest malignancy with metastatic disease. Further evaluation with gadolinium-enhanced MRI of the abdomen should be considered. Electronically Signed   By: [place]   On: 01/11/2017 12:19  Assessment and plan- Patient is a 61 y.o. female with newly diagnosed metastatic breast cancer with  liver metastases ER +40%, PR negative and HER-2/neu negative  Findings on pathology are highly suggestive of breast primary however no breast mass is palpable on today's exam and was not noted on CT thorax as well. At this time I will obtain second opinion on her pathology from our pathologists as well. I will proceed with MRI with and without contrast of bilateral breast as well as MRI brain to rule out metastatic disease. I will obtain a PET CT scan for complete staging workup.   I will also obtain baseline blood work today including CBC with differential, CMP, iron studies, B12 and folate, TSH and reticulocyte count to workup her anemia further. I suspect that her anemia is likely from her underlying malignancy. Patient was also noted to have leukocytosis mainly neutrophilia and thrombocytosis which is also likely reactive secondary to her underlying malignancy. I will also obtain baseline tumor markers including CA 125 (to look for GYN primary) and CA-27-29.  I explained to the patient that given that she has fairly large liver metastases I am inclined to proceed with first line palliative chemotherapy instead of hormone therapy although she was ER positive. I will favor doing weekly carboplatin AUC 2 along with Taxol at 80 mg/m. I'm choosing carboplatin as it works well when there is visceral disease involving liver. I will consider dropping carboplatin down the line if she has significant side effects or if she has achieved good response to treatment in which case I can continue with single agent Taxol alone. I discussed the risks and benefits of chemotherapy including all but not limited to nausea, vomiting, fatigue, low blood counts, risk of infections and hair loss. Risk of allergy reactions to Taxol and peripheral neuropathy associated with it. Patient understands and agrees to proceed. We will proceed with port placement at this time and schedule her for chemotherapy class and tentatively plan to  start chemotherapy in about 8-10 days' time.Patient understands that chemotherapy will be palliative not curative to improve her QOL and longevity. However if her performance status were to decline or if she is unable to tolerate chemotherapy, we will consider best supportive care at that time.   Patient is tachycardic with heart rate in 132G and systolic blood pressure of 88. I will give her 500 mL of normal saline bolus today  Neoplasm-related pain- I will stop her hydrocodone and switch her to oxycodone 10 mg by mouth every 6 when necessary for pain   Total face to face encounter time for this patient visit was 60 min. >50% of the time was  spent in counseling and coordination of care.     Thank you for this kind referral and the opportunity to participate in the care of this patient   Visit Diagnosis 1. Liver metastases ([place])   2. Metastatic breast cancer [facility])     Dr. Randa Evens, MD, MPH [facility] at [facility] Pager- [phone] 01/28/2017

## 2017-01-29 ENCOUNTER — Other Ambulatory Visit: Payer: Self-pay | Admitting: *Deleted

## 2017-01-29 ENCOUNTER — Telehealth: Payer: Self-pay | Admitting: *Deleted

## 2017-01-29 ENCOUNTER — Other Ambulatory Visit: Payer: Self-pay | Admitting: Oncology

## 2017-01-29 ENCOUNTER — Telehealth: Payer: Self-pay

## 2017-01-29 DIAGNOSIS — C787 Secondary malignant neoplasm of liver and intrahepatic bile duct: Secondary | ICD-10-CM

## 2017-01-29 DIAGNOSIS — G894 Chronic pain syndrome: Secondary | ICD-10-CM | POA: Diagnosis not present

## 2017-01-29 DIAGNOSIS — M5090 Cervical disc disorder, unspecified, unspecified cervical region: Secondary | ICD-10-CM | POA: Diagnosis not present

## 2017-01-29 DIAGNOSIS — F17210 Nicotine dependence, cigarettes, uncomplicated: Secondary | ICD-10-CM | POA: Diagnosis not present

## 2017-01-29 DIAGNOSIS — E871 Hypo-osmolality and hyponatremia: Secondary | ICD-10-CM

## 2017-01-29 DIAGNOSIS — E876 Hypokalemia: Secondary | ICD-10-CM

## 2017-01-29 DIAGNOSIS — C50919 Malignant neoplasm of unspecified site of unspecified female breast: Secondary | ICD-10-CM | POA: Insufficient documentation

## 2017-01-29 DIAGNOSIS — D376 Neoplasm of uncertain behavior of liver, gallbladder and bile ducts: Secondary | ICD-10-CM | POA: Diagnosis not present

## 2017-01-29 DIAGNOSIS — M5136 Other intervertebral disc degeneration, lumbar region: Secondary | ICD-10-CM | POA: Diagnosis not present

## 2017-01-29 DIAGNOSIS — G43909 Migraine, unspecified, not intractable, without status migrainosus: Secondary | ICD-10-CM | POA: Diagnosis not present

## 2017-01-29 LAB — VITAMIN B12: VITAMIN B 12: 849 pg/mL (ref 180–914)

## 2017-01-29 LAB — CANCER ANTIGEN 27.29: CA 27.29: 142 U/mL — ABNORMAL HIGH (ref 0.0–38.6)

## 2017-01-29 LAB — PATHOLOGIST SMEAR REVIEW

## 2017-01-29 LAB — CA 125: CA 125: 908.7 U/mL — AB (ref 0.0–38.1)

## 2017-01-29 MED ORDER — POTASSIUM CHLORIDE CRYS ER 20 MEQ PO TBCR: 20.0000 meq | EXTENDED_RELEASE_TABLET | Freq: Two times a day (BID) | ORAL | 0 refills | Status: DC

## 2017-01-29 MED ORDER — SODIUM CHLORIDE 0.9 % IV SOLN: INTRAVENOUS | Status: DC

## 2017-01-29 NOTE — Telephone Encounter (Signed)
She should make an appt to be evaluated by PCP upon his return

## 2017-01-29 NOTE — Telephone Encounter (Signed)
I  Called pt with lab results to tell her that na=128 and K-3.3. I have called in potassium for her to take per Dr. Janese Banks and she will need 1 pill bid. Also she would like her to come in for IVF with potassium in the IV to help with both the na and K+ levels. Pt agreeable to come in tom. And I made her appt 1:30. I told her that when she gets here I will tell her about all the other appts she has. Her husband are agreeable to plan

## 2017-01-29 NOTE — Telephone Encounter (Signed)
Stacy PT with [facility] left v/m; Stacy saw pt for the first time and O2 saturations were between 89% -91%. When pt moved around and exercising O2 sat went to 79% and would recover quickly when pt sat down to 91 %. Heart rate resting was around 100 and when pt walked short distance pulse was 126. Pt does not appear overly SOB and pt has no complaints and pt states she feels fine. Stacy wanted PCP to be aware and request cb if want anything done differently.

## 2017-01-29 NOTE — Patient Instructions (Signed)

## 2017-01-30 ENCOUNTER — Inpatient Hospital Stay: Payer: Medicare Other

## 2017-01-30 DIAGNOSIS — C50919 Malignant neoplasm of unspecified site of unspecified female breast: Secondary | ICD-10-CM | POA: Diagnosis not present

## 2017-01-30 DIAGNOSIS — E876 Hypokalemia: Secondary | ICD-10-CM

## 2017-01-30 DIAGNOSIS — Z5111 Encounter for antineoplastic chemotherapy: Secondary | ICD-10-CM | POA: Diagnosis not present

## 2017-01-30 DIAGNOSIS — E871 Hypo-osmolality and hyponatremia: Secondary | ICD-10-CM

## 2017-01-30 DIAGNOSIS — D509 Iron deficiency anemia, unspecified: Secondary | ICD-10-CM | POA: Diagnosis not present

## 2017-01-30 DIAGNOSIS — K869 Disease of pancreas, unspecified: Secondary | ICD-10-CM | POA: Diagnosis not present

## 2017-01-30 DIAGNOSIS — C787 Secondary malignant neoplasm of liver and intrahepatic bile duct: Secondary | ICD-10-CM | POA: Diagnosis not present

## 2017-01-30 DIAGNOSIS — R188 Other ascites: Secondary | ICD-10-CM | POA: Diagnosis not present

## 2017-01-30 MED ORDER — SODIUM CHLORIDE 0.9 % IV SOLN
INTRAVENOUS | Status: AC
  Administered 2017-01-30: 14:00:00 via INTRAVENOUS
  Filled 2017-01-30 (×2): qty 1000

## 2017-01-30 NOTE — Telephone Encounter (Signed)
Spoke to Roxie and advised. Pt is scheduled to see Dr Silvio Pate 3/14

## 2017-01-31 ENCOUNTER — Telehealth: Payer: Self-pay | Admitting: *Deleted

## 2017-01-31 ENCOUNTER — Other Ambulatory Visit: Payer: Self-pay | Admitting: Internal Medicine

## 2017-01-31 ENCOUNTER — Inpatient Hospital Stay: Payer: Medicare Other

## 2017-01-31 DIAGNOSIS — D376 Neoplasm of uncertain behavior of liver, gallbladder and bile ducts: Secondary | ICD-10-CM | POA: Diagnosis not present

## 2017-01-31 DIAGNOSIS — F17210 Nicotine dependence, cigarettes, uncomplicated: Secondary | ICD-10-CM | POA: Diagnosis not present

## 2017-01-31 DIAGNOSIS — G43909 Migraine, unspecified, not intractable, without status migrainosus: Secondary | ICD-10-CM | POA: Diagnosis not present

## 2017-01-31 DIAGNOSIS — G894 Chronic pain syndrome: Secondary | ICD-10-CM | POA: Diagnosis not present

## 2017-01-31 DIAGNOSIS — M5136 Other intervertebral disc degeneration, lumbar region: Secondary | ICD-10-CM | POA: Diagnosis not present

## 2017-01-31 DIAGNOSIS — M5090 Cervical disc disorder, unspecified, unspecified cervical region: Secondary | ICD-10-CM | POA: Diagnosis not present

## 2017-01-31 MED ORDER — LIDOCAINE-PRILOCAINE 2.5-2.5 % EX CREA: 1.0000 "application " | TOPICAL_CREAM | CUTANEOUS | 1 refills | Status: DC | PRN

## 2017-01-31 NOTE — Telephone Encounter (Signed)
We can prescribe it to her.

## 2017-01-31 NOTE — Telephone Encounter (Signed)
Escribed

## 2017-01-31 NOTE — Telephone Encounter (Signed)
Last Rx 06/2016 #30 1R. Pt has upcoming appt next week

## 2017-01-31 NOTE — Telephone Encounter (Signed)
Patient requesting prescription for EMLA Cream, she is not having her port placed until 3/14. Please advise

## 2017-02-01 ENCOUNTER — Ambulatory Visit
Admission: RE | Admit: 2017-02-01 | Discharge: 2017-02-01 | Disposition: A | Discharge status: Home / Self Care | Payer: Medicare Other | Source: Ambulatory Visit | Attending: Oncology | Admitting: Oncology

## 2017-02-01 DIAGNOSIS — C50919 Malignant neoplasm of unspecified site of unspecified female breast: Secondary | ICD-10-CM

## 2017-02-01 DIAGNOSIS — C787 Secondary malignant neoplasm of liver and intrahepatic bile duct: Secondary | ICD-10-CM

## 2017-02-01 DIAGNOSIS — R922 Inconclusive mammogram: Secondary | ICD-10-CM | POA: Diagnosis not present

## 2017-02-01 DIAGNOSIS — N6489 Other specified disorders of breast: Secondary | ICD-10-CM | POA: Diagnosis not present

## 2017-02-04 ENCOUNTER — Ambulatory Visit (HOSPITAL_COMMUNITY): Payer: Medicare Other

## 2017-02-04 ENCOUNTER — Other Ambulatory Visit: Payer: Self-pay | Admitting: *Deleted

## 2017-02-04 ENCOUNTER — Ambulatory Visit
Admission: RE | Admit: 2017-02-04 | Discharge: 2017-02-04 | Disposition: A | Discharge status: Home / Self Care | Payer: Medicare Other | Source: Ambulatory Visit | Attending: Oncology | Admitting: Oncology

## 2017-02-04 ENCOUNTER — Inpatient Hospital Stay: Payer: Medicare Other

## 2017-02-04 ENCOUNTER — Other Ambulatory Visit: Payer: Self-pay | Admitting: Oncology

## 2017-02-04 ENCOUNTER — Telehealth: Payer: Self-pay | Admitting: *Deleted

## 2017-02-04 DIAGNOSIS — D376 Neoplasm of uncertain behavior of liver, gallbladder and bile ducts: Secondary | ICD-10-CM | POA: Diagnosis not present

## 2017-02-04 DIAGNOSIS — C787 Secondary malignant neoplasm of liver and intrahepatic bile duct: Secondary | ICD-10-CM

## 2017-02-04 DIAGNOSIS — F17210 Nicotine dependence, cigarettes, uncomplicated: Secondary | ICD-10-CM | POA: Diagnosis not present

## 2017-02-04 DIAGNOSIS — C50919 Malignant neoplasm of unspecified site of unspecified female breast: Secondary | ICD-10-CM

## 2017-02-04 DIAGNOSIS — M5090 Cervical disc disorder, unspecified, unspecified cervical region: Secondary | ICD-10-CM | POA: Diagnosis not present

## 2017-02-04 DIAGNOSIS — K8020 Calculus of gallbladder without cholecystitis without obstruction: Secondary | ICD-10-CM | POA: Diagnosis not present

## 2017-02-04 DIAGNOSIS — I700 Atherosclerosis of aorta: Secondary | ICD-10-CM | POA: Diagnosis not present

## 2017-02-04 DIAGNOSIS — E871 Hypo-osmolality and hyponatremia: Secondary | ICD-10-CM

## 2017-02-04 DIAGNOSIS — I2510 Atherosclerotic heart disease of native coronary artery without angina pectoris: Secondary | ICD-10-CM | POA: Insufficient documentation

## 2017-02-04 DIAGNOSIS — G43909 Migraine, unspecified, not intractable, without status migrainosus: Secondary | ICD-10-CM | POA: Diagnosis not present

## 2017-02-04 DIAGNOSIS — M5136 Other intervertebral disc degeneration, lumbar region: Secondary | ICD-10-CM | POA: Diagnosis not present

## 2017-02-04 DIAGNOSIS — G894 Chronic pain syndrome: Secondary | ICD-10-CM | POA: Diagnosis not present

## 2017-02-04 DIAGNOSIS — R188 Other ascites: Secondary | ICD-10-CM | POA: Insufficient documentation

## 2017-02-04 DIAGNOSIS — E876 Hypokalemia: Secondary | ICD-10-CM

## 2017-02-04 DIAGNOSIS — J90 Pleural effusion, not elsewhere classified: Secondary | ICD-10-CM | POA: Diagnosis not present

## 2017-02-04 LAB — GLUCOSE, CAPILLARY: Glucose-Capillary: 96 mg/dL (ref 65–99)

## 2017-02-04 LAB — BASIC METABOLIC PANEL
Anion gap: 10 (ref 5–15)
BUN: 6 mg/dL (ref 6–20)
CHLORIDE: 91 mmol/L — AB (ref 101–111)
CO2: 27 mmol/L (ref 22–32)
Calcium: 8.4 mg/dL — ABNORMAL LOW (ref 8.9–10.3)
Creatinine, Ser: 0.78 mg/dL (ref 0.44–1.00)
GFR calc Af Amer: >60 mL/min (ref >60)
GFR calc non Af Amer: >60 mL/min (ref >60)
Glucose, Bld: 91 mg/dL (ref 65–99)
POTASSIUM: 4.9 mmol/L (ref 3.5–5.1)
Sodium: 128 mmol/L — ABNORMAL LOW (ref 135–145)

## 2017-02-04 MED ORDER — LIDOCAINE-PRILOCAINE 2.5-2.5 % EX CREA: TOPICAL_CREAM | CUTANEOUS | 3 refills | Status: DC

## 2017-02-04 MED ORDER — FLUDEOXYGLUCOSE F - 18 (FDG) INJECTION
12.0000 | Freq: Once | INTRAVENOUS | Status: AC | PRN
  Administered 2017-02-04: 12.56 via INTRAVENOUS

## 2017-02-04 MED ORDER — ONDANSETRON HCL 8 MG PO TABS: 8.0000 mg | ORAL_TABLET | Freq: Two times a day (BID) | ORAL | 1 refills | Status: DC | PRN

## 2017-02-04 MED ORDER — PROCHLORPERAZINE MALEATE 10 MG PO TABS: 10.0000 mg | ORAL_TABLET | Freq: Four times a day (QID) | ORAL | 1 refills | Status: DC | PRN

## 2017-02-04 MED ORDER — DEXAMETHASONE 4 MG PO TABS: 8.0000 mg | ORAL_TABLET | Freq: Every day | ORAL | 1 refills | Status: DC

## 2017-02-04 MED ORDER — LORAZEPAM 0.5 MG PO TABS: 0.5000 mg | ORAL_TABLET | Freq: Four times a day (QID) | ORAL | 0 refills | Status: DC | PRN

## 2017-02-04 NOTE — Telephone Encounter (Signed)
Called to let pt know that her potassium is normal and Na is 128 still. We would like to start her treatment this Friday when she comes and possible need of fluids. She is agreeable to this and she will keep her appt for Friday she will just be here longer so that she can get chemo.

## 2017-02-05 ENCOUNTER — Ambulatory Visit
Admission: RE | Admit: 2017-02-05 | Discharge: 2017-02-05 | Disposition: A | Discharge status: Home / Self Care | Payer: Medicare Other | Source: Ambulatory Visit | Attending: Oncology | Admitting: Oncology

## 2017-02-05 ENCOUNTER — Other Ambulatory Visit: Payer: Self-pay | Admitting: Oncology

## 2017-02-05 DIAGNOSIS — C787 Secondary malignant neoplasm of liver and intrahepatic bile duct: Secondary | ICD-10-CM | POA: Diagnosis not present

## 2017-02-05 DIAGNOSIS — C50919 Malignant neoplasm of unspecified site of unspecified female breast: Secondary | ICD-10-CM

## 2017-02-05 MED ORDER — GADOBENATE DIMEGLUMINE 529 MG/ML IV SOLN
9.0000 mL | Freq: Once | INTRAVENOUS | Status: AC | PRN
  Administered 2017-02-05: 9 mL via INTRAVENOUS

## 2017-02-06 ENCOUNTER — Ambulatory Visit
Admission: RE | Admit: 2017-02-06 | Discharge: 2017-02-06 | Disposition: A | Discharge status: Home / Self Care | Payer: Medicare Other | Source: Ambulatory Visit | Attending: Oncology | Admitting: Oncology

## 2017-02-06 ENCOUNTER — Ambulatory Visit (HOSPITAL_COMMUNITY): Payer: Medicare Other

## 2017-02-06 ENCOUNTER — Ambulatory Visit: Payer: Medicare Other

## 2017-02-06 ENCOUNTER — Ambulatory Visit: Payer: Medicare Other | Admitting: Internal Medicine

## 2017-02-06 ENCOUNTER — Inpatient Hospital Stay: Admission: RE | Admit: 2017-02-06 | Payer: Medicare Other | Source: Ambulatory Visit

## 2017-02-06 DIAGNOSIS — C50919 Malignant neoplasm of unspecified site of unspecified female breast: Secondary | ICD-10-CM | POA: Diagnosis not present

## 2017-02-06 DIAGNOSIS — C787 Secondary malignant neoplasm of liver and intrahepatic bile duct: Secondary | ICD-10-CM | POA: Diagnosis not present

## 2017-02-06 DIAGNOSIS — Z452 Encounter for adjustment and management of vascular access device: Secondary | ICD-10-CM | POA: Diagnosis not present

## 2017-02-06 HISTORY — PX: IR GENERIC HISTORICAL: IMG1180011

## 2017-02-06 LAB — APTT: aPTT: 35 seconds (ref 24–36)

## 2017-02-06 LAB — PROTIME-INR
INR: 1.28
Prothrombin Time: 16.1 seconds — ABNORMAL HIGH (ref 11.4–15.2)

## 2017-02-06 LAB — CBC
HEMATOCRIT: 26.5 % — AB (ref 35.0–47.0)
Hemoglobin: 8.8 g/dL — ABNORMAL LOW (ref 12.0–16.0)
MCH: 27 pg (ref 26.0–34.0)
MCHC: 33.1 g/dL (ref 32.0–36.0)
MCV: 81.5 fL (ref 80.0–100.0)
Platelets: 682 10*3/uL — ABNORMAL HIGH (ref 150–440)
RBC: 3.25 MIL/uL — AB (ref 3.80–5.20)
RDW: 21.2 % — ABNORMAL HIGH (ref 11.5–14.5)
WBC: 30.5 10*3/uL — AB (ref 3.6–11.0)

## 2017-02-06 MED ORDER — HEPARIN SOD (PORK) LOCK FLUSH 10 UNIT/ML IV SOLN
INTRAVENOUS | Status: AC | PRN
  Administered 2017-02-06: 500 [IU]

## 2017-02-06 MED ORDER — MIDAZOLAM HCL 5 MG/5ML IJ SOLN
INTRAMUSCULAR | Status: AC | PRN
  Administered 2017-02-06 (×4): 0.5 mg via INTRAVENOUS

## 2017-02-06 MED ORDER — FENTANYL CITRATE (PF) 100 MCG/2ML IJ SOLN
INTRAMUSCULAR | Status: AC | PRN
  Administered 2017-02-06: 25 ug via INTRAVENOUS
  Administered 2017-02-06 (×2): 12.5 ug via INTRAVENOUS
  Administered 2017-02-06: 25 ug via INTRAVENOUS

## 2017-02-06 MED ORDER — HEPARIN SOD (PORK) LOCK FLUSH 100 UNIT/ML IV SOLN
INTRAVENOUS | Status: AC
  Filled 2017-02-06: qty 5

## 2017-02-06 MED ORDER — MIDAZOLAM HCL 5 MG/5ML IJ SOLN
INTRAMUSCULAR | Status: AC
  Filled 2017-02-06: qty 5

## 2017-02-06 MED ORDER — FENTANYL CITRATE (PF) 100 MCG/2ML IJ SOLN
INTRAMUSCULAR | Status: AC
  Filled 2017-02-06: qty 4

## 2017-02-06 MED ORDER — VANCOMYCIN HCL IN DEXTROSE 1-5 GM/200ML-% IV SOLN
1000.0000 mg | Freq: Once | INTRAVENOUS | Status: AC
  Administered 2017-02-06: 11:00:00 1000 mg via INTRAVENOUS
  Filled 2017-02-06: qty 200

## 2017-02-06 MED ORDER — LIDOCAINE HCL (PF) 1 % IJ SOLN
INTRAMUSCULAR | Status: AC
  Filled 2017-02-06: qty 5

## 2017-02-06 MED ORDER — LIDOCAINE HCL (PF) 1 % IJ SOLN
INTRAMUSCULAR | Status: AC | PRN
  Administered 2017-02-06: 10 mL

## 2017-02-06 MED ORDER — SODIUM CHLORIDE 0.9 % IV SOLN
INTRAVENOUS | Status: DC
  Administered 2017-02-06: 10:00:00 via INTRAVENOUS

## 2017-02-06 NOTE — OR Nursing (Signed)
Dr Barbie Banner notified of lab values. (shown comparison from labs from January 28, 2017)

## 2017-02-06 NOTE — Consult Note (Addendum)
Chief Complaint: Patient was seen in consultation today for No chief complaint on file.  at the request of Rao,Archana C  Referring Physician(s): Rao,Archana C  Supervising Physician: Marybelle Killings  Patient Status: [facility] - Out-pt  History of Present Illness: Rachel Butler is a 61 y.o. female with breast cancer metastatic to the liver. She is referred for port placement. She denies any complaints other than fatigue. She underwent a liver biopsy earlier this year which was complicated by hemorrhage. She was also worked up for fevers (including echo to rule out endocarditis) which was negative. Her WBCs have remained elevated, but this was presumed to refect malignancy, noted on her discharge summary.  Past Medical History:  Diagnosis Date  . Anemia   . Arthritis   . Blood transfusion without reported diagnosis   . Cancer ([place])   . Cervical disc disease   . COPD (chronic obstructive pulmonary disease) ([place])   . Intervertebral disk disease   . Lumbar disc disease   . Migraines   . Nicotine dependence   . UTI (urinary tract infection)     Past Surgical History:  Procedure Laterality Date  . ABDOMINAL HYSTERECTOMY  1986   right tube and ovary removed--then laparoscopy after for ?retained cyst?  . CERVICAL DISCECTOMY  1993   C5-6  . CESAREAN SECTION  [id number]  . COLONOSCOPY    . disk removal     [id number]  . IR GENERIC HISTORICAL  01/18/2017   IR EMBO ART  VEN HEMORR LYMPH EXTRAV  INC GUIDE ROADMAPPING 01/18/2017 Greggory Keen, MD [facility]  . IR GENERIC HISTORICAL  01/18/2017   IR ANGIOGRAM SELECTIVE EACH ADDITIONAL VESSEL 01/18/2017 Greggory Keen, MD [facility]  . IR GENERIC HISTORICAL  01/18/2017   IR ANGIOGRAM SELECTIVE EACH ADDITIONAL VESSEL 01/18/2017 Greggory Keen, MD [facility]  . IR GENERIC HISTORICAL  01/18/2017   IR US GUIDE VASC ACCESS RIGHT 01/18/2017 Greggory Keen, MD [facility]  . IR GENERIC HISTORICAL  01/18/2017   IR ANGIOGRAM VISCERAL SELECTIVE  01/18/2017 Greggory Keen, MD [facility]  . IR GENERIC HISTORICAL  01/18/2017   IR ANGIOGRAM SELECTIVE EACH ADDITIONAL VESSEL 01/18/2017 Greggory Keen, MD [facility]  . IR GENERIC HISTORICAL  01/18/2017   IR ANGIOGRAM VISCERAL SELECTIVE 01/18/2017 Greggory Keen, MD [facility]  . TEE WITHOUT CARDIOVERSION N/A 01/24/2017   Procedure: TRANSESOPHAGEAL ECHOCARDIOGRAM (TEE)  WITH ANESTHESIA;  Surgeon: Sueanne Margarita, MD;  Location: [facility];  Service: Cardiovascular;  Laterality: N/A;  . [place]  . TUBAL LIGATION      Allergies: Indomethacin; Penicillins; Robaxin [methocarbamol]; and Nubain [nalbuphine hcl]  Medications: Prior to Admission medications   Medication Sig Start Date End Date Taking? Authorizing Provider  BIOTIN 5000 PO Take by mouth daily.   Yes Historical Provider, MD  Melatonin 2.5 MG CAPS Take 1 capsule by mouth at bedtime.   Yes Historical Provider, MD  metoprolol tartrate (LOPRESSOR) 25 MG tablet Take 0.5 tablets (12.5 mg total) by mouth 2 (two) times daily. 01/24/17  Yes Shanker Kristeen Mans, MD  Multiple Vitamins-Minerals (MULTIVITAMIN WITH MINERALS) tablet Take 1 tablet by mouth daily.   Yes Historical Provider, MD  Oxycodone HCl 10 MG TABS Take 1 tablet (10 mg total) by mouth every 6 (six) hours as needed. 01/28/17  Yes Sindy Guadeloupe, MD  potassium chloride SA (K-DUR,KLOR-CON) 20 MEQ tablet Take 1 tablet (20 mEq total) by mouth 2 (two) times daily. 01/29/17  Yes Sindy Guadeloupe,  MD  sennosides-docusate sodium (SENOKOT-S) 8.6-50 MG tablet Take 2 tablets by mouth daily.   Yes Historical Provider, MD  tiZANidine (ZANAFLEX) 4 MG capsule Take 4 mg by mouth 3 (three) times daily. Takes 1 tid and up to 3 tablets at bedtime to sleep   Yes Historical Provider, MD  dexamethasone (DECADRON) 4 MG tablet Take 2 tablets (8 mg total) by mouth daily. Start the day after chemotherapy for 2 days. 02/04/17   Sindy Guadeloupe, MD  fentaNYL (DURAGESIC - DOSED MCG/HR) 25  MCG/HR patch Place 25 mcg onto the skin every 3 (three) days.  12/13/14   Historical Provider, MD  gabapentin (NEURONTIN) 800 MG tablet Take 1 tablet (800 mg total) by mouth 3 (three) times daily. 01/09/17   Venia Carbon, MD  lidocaine-prilocaine (EMLA) cream Apply 1 application topically as needed. Apply small amount to port site at least 1 hour prior to it being accessed, cover with plastic wrap 01/31/17   Sindy Guadeloupe, MD  lidocaine-prilocaine (EMLA) cream Apply to affected area once 02/04/17   Sindy Guadeloupe, MD  LORazepam (ATIVAN) 0.5 MG tablet Take 1 tablet (0.5 mg total) by mouth every 6 (six) hours as needed (Nausea or vomiting). 02/04/17   Sindy Guadeloupe, MD  ondansetron (ZOFRAN) 8 MG tablet Take 1 tablet (8 mg total) by mouth 2 (two) times daily as needed for refractory nausea / vomiting. Start on day 3 after chemo. 02/04/17   Sindy Guadeloupe, MD  polyethylene glycol (MIRALAX / GLYCOLAX) packet Take 17 g by mouth daily. 01/23/17   Florencia Reasons, MD  prochlorperazine (COMPAZINE) 10 MG tablet Take 1 tablet (10 mg total) by mouth every 6 (six) hours as needed (Nausea or vomiting). 02/04/17   Sindy Guadeloupe, MD  promethazine (PHENERGAN) 25 MG tablet TAKE 1/2 TO 1 TABLETS BY MOUTH 3 TIMES DAILY AS NEEDED FOR NAUSEA 01/31/17   Jearld Fenton, NP     Family History  Problem Relation Age of Onset  . Cancer Mother   . COPD Father   . Heart disease Father   . Hypertension Father   . Thyroid disease Other   . Cancer Other     mother's side  . Heart disease Other     Dad's side  . Colon cancer Neg Hx     Social History   Social History  . Marital status: Married    Spouse name: N/A  . Number of children: 2  . Years of education: N/A   Occupational History  . Social research officer, government ---retired   . 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 Smoking status: Current Every Day Smoker    Packs/day: 1.00    Years: 40.00    Types: Cigarettes  . Smokeless tobacco: Never Used  . Alcohol use No  .  Drug use: No  . Sexual activity: Not Asked   Other Topics Concern  . None   Social History Narrative   [name] daughter      Has living will   Husband is health care POA   Would accept resuscitation but no prolonged ventilation   No tube feeds if cognitively unaware    ECOG Status: 1 - Symptomatic but completely ambulatory  Review of Systems: A 12 point ROS discussed and pertinent positives are indicated in the HPI above.  All other systems are negative.  Review of Systems  Vital Signs: BP 100/69   Pulse 91  Temp 98.9 F (37.2 C)   SpO2 90%   Physical Exam  Mallampati Score:     Imaging: Dg Chest 2 View  Result Date: 01/16/2017 CLINICAL DATA:  Liver metastases.  Tobacco use. EXAM: CHEST  2 VIEW COMPARISON:  August 25, 2015 FINDINGS: There are nipple shadows bilaterally. There is no evident edema or consolidation. No pulmonary nodular lesion is demonstrable by radiography. Heart size and pulmonary vascularity are normal. No adenopathy. There is atherosclerotic calcification in the aorta. No blastic or lytic bone lesions. IMPRESSION: Aortic atherosclerosis. No edema or consolidation. No evident mass or adenopathy. Electronically Signed   By: Lowella Grip III M.D.   On: 01/16/2017 14:34   Ct Angio Chest Pe W Or Wo Contrast  Addendum Date: 01/23/2017   ADDENDUM REPORT: 01/23/2017 14:00 ADDENDUM: Voice recognition error: The third sentence of the Findings section should read "There is no focal filling defect to suggest pulmonary embolus." Electronically Signed   By: San Morelle M.D.   On: 01/23/2017 14:00   Result Date: 01/23/2017 CLINICAL DATA:  Hypoxia. EXAM: CT ANGIOGRAPHY CHEST WITH CONTRAST TECHNIQUE: Multidetector CT imaging of the chest was performed using the standard protocol during bolus administration of intravenous contrast. Multiplanar CT image reconstructions and MIPs were obtained to evaluate the vascular anatomy. CONTRAST:  80 mL Isovue  370 COMPARISON:  One-view chest x-ray 01/18/2017. CT of the abdomen and pelvis 01/20/2017 FINDINGS: Cardiovascular: The heart size is normal. The pulmonary arteries demonstrate excellent opacification. There is focal filling defect to suggest pulmonary embolus. Pulmonary arterial size is normal. The aorta demonstrates atherosclerotic change without aneurysm. A 4 vessel arch configuration is present. The left vertebral artery Mediastinum/Nodes: No significant mediastinal or axillary adenopathy is present. The esophagus is unremarkable. Secretions versus small tracheal nodules are evident. Lungs/Pleura: Centrilobular emphysema is present. No focal nodule or mass lesion is present. Mild dependent atelectasis is present bilaterally. There are small bilateral pleural effusions. Upper Abdomen: Liver metastases are again noted. Musculoskeletal: No focal lytic or blastic lesions are present. Vertebral body heights alignment are maintained. Review of the MIP images confirms the above findings. IMPRESSION: 1. No pulmonary embolus. 2. Centrilobular emphysema. 3. Secretions versus small tracheal nodules. Short-term follow-up CT scan of or bronchoscopy would be useful for further evaluation. 4. Aortic atherosclerosis. 5. Liver metastases. Electronically Signed: By: San Morelle M.D. On: 01/22/2017 09:53   Mr Jeri Cos QM Contrast  Result Date: 02/05/2017 CLINICAL DATA:  Breast cancer with liver metastases. EXAM: MRI HEAD WITHOUT AND WITH CONTRAST TECHNIQUE: Multiplanar, multiecho pulse sequences of the brain and surrounding structures were obtained without and with intravenous contrast. CONTRAST:  66mL MULTIHANCE GADOBENATE DIMEGLUMINE 529 MG/ML IV SOLN COMPARISON:  PET CT 02/04/2017 FINDINGS: Brain: No focal diffusion restriction to indicate acute infarct. No intraparenchymal hemorrhage. There is minimal multifocal hyperintense T2-weighted signal within the periventricular white matter, which may be seen in the setting  of migraine headaches or early chronic microvascular disease; however, it is also seen in normal patients of this age. No mass lesion or midline shift. No hydrocephalus or extra-axial fluid collection. The midline structures are normal. No age advanced or lobar predominant atrophy. Vascular: Major intracranial arterial and venous sinus flow voids are preserved. No evidence of chronic microhemorrhage or amyloid angiopathy. Skull and upper cervical spine: The visualized skull base, calvarium, upper cervical spine and extracranial soft tissues are normal. Sinuses/Orbits: No fluid levels or advanced mucosal thickening. No mastoid effusion. Normal orbits. IMPRESSION: No intracranial metastatic disease. Electronically Signed  By: Ulyses Jarred M.D.   On: 02/05/2017 14:44   Mr Abdomen Wwo Contrast  Result Date: 01/15/2017 CLINICAL DATA:  Evaluate liver lesions noted on ultrasound. EXAM: MRI ABDOMEN WITHOUT AND WITH CONTRAST TECHNIQUE: Multiplanar multisequence MR imaging of the abdomen was performed both before and after the administration of intravenous contrast. CONTRAST:  57mL MULTIHANCE GADOBENATE DIMEGLUMINE 529 MG/ML IV SOLN COMPARISON:  None. FINDINGS: Lower chest: No acute findings. Hepatobiliary: The liver is enlarged containing multiple ill defined heterogeneous and peripheral enhancing lesions worrisome for multifocal metastatic disease. The largest lesion is in the medial segment of left lobe measuring 6 cm, image number 26 of series 6. Lesion within segment 8 measures 3.2 cm, image number 8 of series 6. Segment 5 lesion measures 3.1 cm, image 31 of series 6. The gallbladder fundus extends into the large mass within the medial segment of left lobe, image number 65 of series 1401. No biliary dilatation. Pancreas:  No inflammation or mass. Spleen:  Within normal limits in size and appearance. Adrenals/Urinary Tract: No masses identified. No evidence of hydronephrosis. Stomach/Bowel: The stomach is normal.  The small bowel loops have a normal course and caliber. Visualized portions of the colon are unremarkable. Vascular/Lymphatic: Normal appearance of the abdominal aorta. Aortic atherosclerosis noted. Periaortic node is prominent measuring 8 mm, image number 50 of series 1401. Necrotic appearing portacaval node measures 1.5 cm, image 55 of series 1405. Porta hepatis node measures 1.3 cm, image 49 of series 1404 and has mass effect upon the extrahepatic portal vein. Other:  None. Musculoskeletal: No suspicious bone lesions identified. IMPRESSION: 1. Examination is positive for multifocal heterogeneous enhancing liver lesions worrisome for diffuse hepatic metastasis. Correlation with tissue sampling is advised. The dominant mass appears contiguous with the gallbladder fundus. Cannot rule out gallbladder carcinoma or other GI primary. 2. Enlarged portacaval and porta hepatic lymph nodes worrisome for metastatic adenopathy. Electronically Signed   By: Kerby Moors M.D.   On: 01/15/2017 16:09   Ir Angiogram Visceral Selective  Result Date: 01/19/2017 INDICATION: Acute abdominal pain with acute perihepatic hemorrhage 2 hours status post percutaneous left hepatic metastasis biopsy. EXAM: SELECTIVE VISCERAL ARTERIOGRAPHY; IR EMBO ART VEN HEMORR LYMPH EXTRAV INC GUIDE ROADMAPPING; ADDITIONAL ARTERIOGRAPHY; IR ULTRASOUND GUIDANCE VASC ACCESS RIGHT MEDICATIONS: 1% lidocaine locally. ANESTHESIA/SEDATION: Moderate (conscious) sedation was employed during this procedure. A total of Versed 1.0 mg and Fentanyl 25 mcg was administered intravenously. Moderate Sedation Time: 50 minutes. The patient's level of consciousness and vital signs were monitored continuously by radiology nursing throughout the procedure under my direct supervision. CONTRAST:  1 ISOVUE-300 IOPAMIDOL (ISOVUE-300) INJECTION 61%, 1 ISOVUE-300 IOPAMIDOL (ISOVUE-300) INJECTION 61% FLUOROSCOPY TIME:  Fluoroscopy Time: 14 minutes 12 seconds (144 mGy).  COMPLICATIONS: None immediate. PROCEDURE: Informed consent was obtained from the patient following explanation of the procedure, risks, benefits and alternatives. The patient understands, agrees and consents for the procedure. All questions were addressed. A time out was performed prior to the initiation of the procedure. Maximal barrier sterile technique utilized including caps, mask, sterile gowns, sterile gloves, large sterile drape, hand hygiene, and Betadine prep. Under sterile conditions and local anesthesia, ultrasound micropuncture access performed of the right common femoral artery. Five French sheath inserted. SOS catheter was utilized to select the celiac origin. Celiac angiogram performed. Celiac: Celiac origin is widely patent. Splenic, common hepatic, gastroduodenal artery, proper hepatic, right and left hepatic arteries are all patent. Irregularity of the hepatic vasculature is secondary to hepatomegaly and diffuse hepatic metastases. No active bleeding demonstrated.  On the venous phase, the splenic vein and portal vein are all patent. Through the [place] a 5 French catheter, a Renegade STC microcatheter was advanced over a micro glidewire into the proper hepatic artery. Proper hepatic angiogram: Proper hepatic, right and left hepatic arteries are patent. Vasospasm present in the proximal right hepatic artery related to wire manipulation. Limited assessment of the vasculature because of the contrast injection and poor opacification of the left hepatic artery. Over a double angled Glidewire, the microcatheter was advanced into the left hepatic artery. Left hepatic angiogram performed. Left hepatic: Left hepatic artery is patent. This is the dominant supply to the left hepatic lobe lateral segment were the percutaneous biopsy was performed. Peripheral irregularity of the arterial vasculature related to hepatic metastases. No active extravasation, acute bleeding, or other acute vascular abnormality including  AV fistula or pseudoaneurysm. Because of the significant recent acute perihepatic hemorrhage and a biopsy performed in the left hepatic lobe lateral segment, Gel-Foam embolization of left hepatic artery will be performed. Left hepatic artery Gel-Foam embolization: Through the microcatheter, approximately 5 cc of Gel-Foam slurry was instilled into left hepatic artery. Following embolization, there is significant reduction in the left hepatic arterial peripheral vasculature and sluggish flow but the left hepatic main artery remains patent. Catheter was retracted and advanced into a left hepatic branch supplying the medial segment. Additional left hepatic angiogram performed. Additional left hepatic angiogram: Irregularity of the parenchymal staining related to diffuse hepatic metastases. Scattered peripheral occlusions present in the additional left hepatic branch related to the Gel-Foam embolization. Vasospasm noted at the catheter site. No active bleeding demonstrated. Microcatheter was removed. A final angiogram was performed through the SOS 5 French catheter of the celiac origin. Celiac: Following left hepatic Gel-Foam embolization, there is significant reduction in the arterial vascularity to the the left hepatic lobe where the biopsy was performed. Residual vasospasm present in the proximal left hepatic artery. No other significant acute vascular process. Catheter was retracted and advanced into the SMA. SMA angiogram performed. SMA angiogram: SMA is widely patent. No variant SMA supply demonstrated to the liver. On the venous phase, the superior mesenteric vein and portal vein all remain patent. Access removed. Hemostasis obtained with manual compression of the right common femoral artery puncture site. Patient tolerated the procedure well. No immediate complication. IMPRESSION: Successful celiac, hepatic, and SMA angiograms as above. Successful micro catheterization, angiograms, and Gel-Foam embolization of  the left hepatic artery resulting in significant reduction in arterial flow to the area the liver were the percutaneous biopsy was performed. Electronically Signed   By: Jerilynn Mages.  Shick M.D.   On: 01/19/2017 10:01   Ir Angiogram Visceral Selective  Result Date: 01/19/2017 INDICATION: Acute abdominal pain with acute perihepatic hemorrhage 2 hours status post percutaneous left hepatic metastasis biopsy. EXAM: SELECTIVE VISCERAL ARTERIOGRAPHY; IR EMBO ART VEN HEMORR LYMPH EXTRAV INC GUIDE ROADMAPPING; ADDITIONAL ARTERIOGRAPHY; IR ULTRASOUND GUIDANCE VASC ACCESS RIGHT MEDICATIONS: 1% lidocaine locally. ANESTHESIA/SEDATION: Moderate (conscious) sedation was employed during this procedure. A total of Versed 1.0 mg and Fentanyl 25 mcg was administered intravenously. Moderate Sedation Time: 50 minutes. The patient's level of consciousness and vital signs were monitored continuously by radiology nursing throughout the procedure under my direct supervision. CONTRAST:  1 ISOVUE-300 IOPAMIDOL (ISOVUE-300) INJECTION 61%, 1 ISOVUE-300 IOPAMIDOL (ISOVUE-300) INJECTION 61% FLUOROSCOPY TIME:  Fluoroscopy Time: 14 minutes 12 seconds (144 mGy). COMPLICATIONS: None immediate. PROCEDURE: Informed consent was obtained from the patient following explanation of the procedure, risks, benefits and alternatives. The patient understands, agrees  and consents for the procedure. All questions were addressed. A time out was performed prior to the initiation of the procedure. Maximal barrier sterile technique utilized including caps, mask, sterile gowns, sterile gloves, large sterile drape, hand hygiene, and Betadine prep. Under sterile conditions and local anesthesia, ultrasound micropuncture access performed of the right common femoral artery. Five French sheath inserted. SOS catheter was utilized to select the celiac origin. Celiac angiogram performed. Celiac: Celiac origin is widely patent. Splenic, common hepatic, gastroduodenal artery, proper  hepatic, right and left hepatic arteries are all patent. Irregularity of the hepatic vasculature is secondary to hepatomegaly and diffuse hepatic metastases. No active bleeding demonstrated. On the venous phase, the splenic vein and portal vein are all patent. Through the [place] a 5 French catheter, a Renegade STC microcatheter was advanced over a micro glidewire into the proper hepatic artery. Proper hepatic angiogram: Proper hepatic, right and left hepatic arteries are patent. Vasospasm present in the proximal right hepatic artery related to wire manipulation. Limited assessment of the vasculature because of the contrast injection and poor opacification of the left hepatic artery. Over a double angled Glidewire, the microcatheter was advanced into the left hepatic artery. Left hepatic angiogram performed. Left hepatic: Left hepatic artery is patent. This is the dominant supply to the left hepatic lobe lateral segment were the percutaneous biopsy was performed. Peripheral irregularity of the arterial vasculature related to hepatic metastases. No active extravasation, acute bleeding, or other acute vascular abnormality including AV fistula or pseudoaneurysm. Because of the significant recent acute perihepatic hemorrhage and a biopsy performed in the left hepatic lobe lateral segment, Gel-Foam embolization of left hepatic artery will be performed. Left hepatic artery Gel-Foam embolization: Through the microcatheter, approximately 5 cc of Gel-Foam slurry was instilled into left hepatic artery. Following embolization, there is significant reduction in the left hepatic arterial peripheral vasculature and sluggish flow but the left hepatic main artery remains patent. Catheter was retracted and advanced into a left hepatic branch supplying the medial segment. Additional left hepatic angiogram performed. Additional left hepatic angiogram: Irregularity of the parenchymal staining related to diffuse hepatic metastases.  Scattered peripheral occlusions present in the additional left hepatic branch related to the Gel-Foam embolization. Vasospasm noted at the catheter site. No active bleeding demonstrated. Microcatheter was removed. A final angiogram was performed through the SOS 5 French catheter of the celiac origin. Celiac: Following left hepatic Gel-Foam embolization, there is significant reduction in the arterial vascularity to the the left hepatic lobe where the biopsy was performed. Residual vasospasm present in the proximal left hepatic artery. No other significant acute vascular process. Catheter was retracted and advanced into the SMA. SMA angiogram performed. SMA angiogram: SMA is widely patent. No variant SMA supply demonstrated to the liver. On the venous phase, the superior mesenteric vein and portal vein all remain patent. Access removed. Hemostasis obtained with manual compression of the right common femoral artery puncture site. Patient tolerated the procedure well. No immediate complication. IMPRESSION: Successful celiac, hepatic, and SMA angiograms as above. Successful micro catheterization, angiograms, and Gel-Foam embolization of the left hepatic artery resulting in significant reduction in arterial flow to the area the liver were the percutaneous biopsy was performed. Electronically Signed   By: Jerilynn Mages.  Shick M.D.   On: 01/19/2017 10:01   Ir Angiogram Selective Each Additional Vessel  Result Date: 01/19/2017 INDICATION: Acute abdominal pain with acute perihepatic hemorrhage 2 hours status post percutaneous left hepatic metastasis biopsy. EXAM: SELECTIVE VISCERAL ARTERIOGRAPHY; IR EMBO ART VEN HEMORR LYMPH  EXTRAV INC GUIDE ROADMAPPING; ADDITIONAL ARTERIOGRAPHY; IR ULTRASOUND GUIDANCE VASC ACCESS RIGHT MEDICATIONS: 1% lidocaine locally. ANESTHESIA/SEDATION: Moderate (conscious) sedation was employed during this procedure. A total of Versed 1.0 mg and Fentanyl 25 mcg was administered intravenously. Moderate Sedation  Time: 50 minutes. The patient's level of consciousness and vital signs were monitored continuously by radiology nursing throughout the procedure under my direct supervision. CONTRAST:  1 ISOVUE-300 IOPAMIDOL (ISOVUE-300) INJECTION 61%, 1 ISOVUE-300 IOPAMIDOL (ISOVUE-300) INJECTION 61% FLUOROSCOPY TIME:  Fluoroscopy Time: 14 minutes 12 seconds (144 mGy). COMPLICATIONS: None immediate. PROCEDURE: Informed consent was obtained from the patient following explanation of the procedure, risks, benefits and alternatives. The patient understands, agrees and consents for the procedure. All questions were addressed. A time out was performed prior to the initiation of the procedure. Maximal barrier sterile technique utilized including caps, mask, sterile gowns, sterile gloves, large sterile drape, hand hygiene, and Betadine prep. Under sterile conditions and local anesthesia, ultrasound micropuncture access performed of the right common femoral artery. Five French sheath inserted. SOS catheter was utilized to select the celiac origin. Celiac angiogram performed. Celiac: Celiac origin is widely patent. Splenic, common hepatic, gastroduodenal artery, proper hepatic, right and left hepatic arteries are all patent. Irregularity of the hepatic vasculature is secondary to hepatomegaly and diffuse hepatic metastases. No active bleeding demonstrated. On the venous phase, the splenic vein and portal vein are all patent. Through the [place] a 5 French catheter, a Renegade STC microcatheter was advanced over a micro glidewire into the proper hepatic artery. Proper hepatic angiogram: Proper hepatic, right and left hepatic arteries are patent. Vasospasm present in the proximal right hepatic artery related to wire manipulation. Limited assessment of the vasculature because of the contrast injection and poor opacification of the left hepatic artery. Over a double angled Glidewire, the microcatheter was advanced into the left hepatic artery. Left  hepatic angiogram performed. Left hepatic: Left hepatic artery is patent. This is the dominant supply to the left hepatic lobe lateral segment were the percutaneous biopsy was performed. Peripheral irregularity of the arterial vasculature related to hepatic metastases. No active extravasation, acute bleeding, or other acute vascular abnormality including AV fistula or pseudoaneurysm. Because of the significant recent acute perihepatic hemorrhage and a biopsy performed in the left hepatic lobe lateral segment, Gel-Foam embolization of left hepatic artery will be performed. Left hepatic artery Gel-Foam embolization: Through the microcatheter, approximately 5 cc of Gel-Foam slurry was instilled into left hepatic artery. Following embolization, there is significant reduction in the left hepatic arterial peripheral vasculature and sluggish flow but the left hepatic main artery remains patent. Catheter was retracted and advanced into a left hepatic branch supplying the medial segment. Additional left hepatic angiogram performed. Additional left hepatic angiogram: Irregularity of the parenchymal staining related to diffuse hepatic metastases. Scattered peripheral occlusions present in the additional left hepatic branch related to the Gel-Foam embolization. Vasospasm noted at the catheter site. No active bleeding demonstrated. Microcatheter was removed. A final angiogram was performed through the SOS 5 French catheter of the celiac origin. Celiac: Following left hepatic Gel-Foam embolization, there is significant reduction in the arterial vascularity to the the left hepatic lobe where the biopsy was performed. Residual vasospasm present in the proximal left hepatic artery. No other significant acute vascular process. Catheter was retracted and advanced into the SMA. SMA angiogram performed. SMA angiogram: SMA is widely patent. No variant SMA supply demonstrated to the liver. On the venous phase, the superior mesenteric vein  and portal vein all remain patent. Access  removed. Hemostasis obtained with manual compression of the right common femoral artery puncture site. Patient tolerated the procedure well. No immediate complication. IMPRESSION: Successful celiac, hepatic, and SMA angiograms as above. Successful micro catheterization, angiograms, and Gel-Foam embolization of the left hepatic artery resulting in significant reduction in arterial flow to the area the liver were the percutaneous biopsy was performed. Electronically Signed   By: Jerilynn Mages.  Shick M.D.   On: 01/19/2017 10:01   Ir Angiogram Selective Each Additional Vessel  Result Date: 01/19/2017 INDICATION: Acute abdominal pain with acute perihepatic hemorrhage 2 hours status post percutaneous left hepatic metastasis biopsy. EXAM: SELECTIVE VISCERAL ARTERIOGRAPHY; IR EMBO ART VEN HEMORR LYMPH EXTRAV INC GUIDE ROADMAPPING; ADDITIONAL ARTERIOGRAPHY; IR ULTRASOUND GUIDANCE VASC ACCESS RIGHT MEDICATIONS: 1% lidocaine locally. ANESTHESIA/SEDATION: Moderate (conscious) sedation was employed during this procedure. A total of Versed 1.0 mg and Fentanyl 25 mcg was administered intravenously. Moderate Sedation Time: 50 minutes. The patient's level of consciousness and vital signs were monitored continuously by radiology nursing throughout the procedure under my direct supervision. CONTRAST:  1 ISOVUE-300 IOPAMIDOL (ISOVUE-300) INJECTION 61%, 1 ISOVUE-300 IOPAMIDOL (ISOVUE-300) INJECTION 61% FLUOROSCOPY TIME:  Fluoroscopy Time: 14 minutes 12 seconds (144 mGy). COMPLICATIONS: None immediate. PROCEDURE: Informed consent was obtained from the patient following explanation of the procedure, risks, benefits and alternatives. The patient understands, agrees and consents for the procedure. All questions were addressed. A time out was performed prior to the initiation of the procedure. Maximal barrier sterile technique utilized including caps, mask, sterile gowns, sterile gloves, large sterile  drape, hand hygiene, and Betadine prep. Under sterile conditions and local anesthesia, ultrasound micropuncture access performed of the right common femoral artery. Five French sheath inserted. SOS catheter was utilized to select the celiac origin. Celiac angiogram performed. Celiac: Celiac origin is widely patent. Splenic, common hepatic, gastroduodenal artery, proper hepatic, right and left hepatic arteries are all patent. Irregularity of the hepatic vasculature is secondary to hepatomegaly and diffuse hepatic metastases. No active bleeding demonstrated. On the venous phase, the splenic vein and portal vein are all patent. Through the [place] a 5 French catheter, a Renegade STC microcatheter was advanced over a micro glidewire into the proper hepatic artery. Proper hepatic angiogram: Proper hepatic, right and left hepatic arteries are patent. Vasospasm present in the proximal right hepatic artery related to wire manipulation. Limited assessment of the vasculature because of the contrast injection and poor opacification of the left hepatic artery. Over a double angled Glidewire, the microcatheter was advanced into the left hepatic artery. Left hepatic angiogram performed. Left hepatic: Left hepatic artery is patent. This is the dominant supply to the left hepatic lobe lateral segment were the percutaneous biopsy was performed. Peripheral irregularity of the arterial vasculature related to hepatic metastases. No active extravasation, acute bleeding, or other acute vascular abnormality including AV fistula or pseudoaneurysm. Because of the significant recent acute perihepatic hemorrhage and a biopsy performed in the left hepatic lobe lateral segment, Gel-Foam embolization of left hepatic artery will be performed. Left hepatic artery Gel-Foam embolization: Through the microcatheter, approximately 5 cc of Gel-Foam slurry was instilled into left hepatic artery. Following embolization, there is significant reduction in the  left hepatic arterial peripheral vasculature and sluggish flow but the left hepatic main artery remains patent. Catheter was retracted and advanced into a left hepatic branch supplying the medial segment. Additional left hepatic angiogram performed. Additional left hepatic angiogram: Irregularity of the parenchymal staining related to diffuse hepatic metastases. Scattered peripheral occlusions present in the additional left hepatic branch  related to the Gel-Foam embolization. Vasospasm noted at the catheter site. No active bleeding demonstrated. Microcatheter was removed. A final angiogram was performed through the SOS 5 French catheter of the celiac origin. Celiac: Following left hepatic Gel-Foam embolization, there is significant reduction in the arterial vascularity to the the left hepatic lobe where the biopsy was performed. Residual vasospasm present in the proximal left hepatic artery. No other significant acute vascular process. Catheter was retracted and advanced into the SMA. SMA angiogram performed. SMA angiogram: SMA is widely patent. No variant SMA supply demonstrated to the liver. On the venous phase, the superior mesenteric vein and portal vein all remain patent. Access removed. Hemostasis obtained with manual compression of the right common femoral artery puncture site. Patient tolerated the procedure well. No immediate complication. IMPRESSION: Successful celiac, hepatic, and SMA angiograms as above. Successful micro catheterization, angiograms, and Gel-Foam embolization of the left hepatic artery resulting in significant reduction in arterial flow to the area the liver were the percutaneous biopsy was performed. Electronically Signed   By: Jerilynn Mages.  Shick M.D.   On: 01/19/2017 10:01   Ir Angiogram Selective Each Additional Vessel  Result Date: 01/19/2017 INDICATION: Acute abdominal pain with acute perihepatic hemorrhage 2 hours status post percutaneous left hepatic metastasis biopsy. EXAM: SELECTIVE  VISCERAL ARTERIOGRAPHY; IR EMBO ART VEN HEMORR LYMPH EXTRAV INC GUIDE ROADMAPPING; ADDITIONAL ARTERIOGRAPHY; IR ULTRASOUND GUIDANCE VASC ACCESS RIGHT MEDICATIONS: 1% lidocaine locally. ANESTHESIA/SEDATION: Moderate (conscious) sedation was employed during this procedure. A total of Versed 1.0 mg and Fentanyl 25 mcg was administered intravenously. Moderate Sedation Time: 50 minutes. The patient's level of consciousness and vital signs were monitored continuously by radiology nursing throughout the procedure under my direct supervision. CONTRAST:  1 ISOVUE-300 IOPAMIDOL (ISOVUE-300) INJECTION 61%, 1 ISOVUE-300 IOPAMIDOL (ISOVUE-300) INJECTION 61% FLUOROSCOPY TIME:  Fluoroscopy Time: 14 minutes 12 seconds (144 mGy). COMPLICATIONS: None immediate. PROCEDURE: Informed consent was obtained from the patient following explanation of the procedure, risks, benefits and alternatives. The patient understands, agrees and consents for the procedure. All questions were addressed. A time out was performed prior to the initiation of the procedure. Maximal barrier sterile technique utilized including caps, mask, sterile gowns, sterile gloves, large sterile drape, hand hygiene, and Betadine prep. Under sterile conditions and local anesthesia, ultrasound micropuncture access performed of the right common femoral artery. Five French sheath inserted. SOS catheter was utilized to select the celiac origin. Celiac angiogram performed. Celiac: Celiac origin is widely patent. Splenic, common hepatic, gastroduodenal artery, proper hepatic, right and left hepatic arteries are all patent. Irregularity of the hepatic vasculature is secondary to hepatomegaly and diffuse hepatic metastases. No active bleeding demonstrated. On the venous phase, the splenic vein and portal vein are all patent. Through the [place] a 5 French catheter, a Renegade STC microcatheter was advanced over a micro glidewire into the proper hepatic artery. Proper hepatic angiogram:  Proper hepatic, right and left hepatic arteries are patent. Vasospasm present in the proximal right hepatic artery related to wire manipulation. Limited assessment of the vasculature because of the contrast injection and poor opacification of the left hepatic artery. Over a double angled Glidewire, the microcatheter was advanced into the left hepatic artery. Left hepatic angiogram performed. Left hepatic: Left hepatic artery is patent. This is the dominant supply to the left hepatic lobe lateral segment were the percutaneous biopsy was performed. Peripheral irregularity of the arterial vasculature related to hepatic metastases. No active extravasation, acute bleeding, or other acute vascular abnormality including AV fistula or pseudoaneurysm. Because of  the significant recent acute perihepatic hemorrhage and a biopsy performed in the left hepatic lobe lateral segment, Gel-Foam embolization of left hepatic artery will be performed. Left hepatic artery Gel-Foam embolization: Through the microcatheter, approximately 5 cc of Gel-Foam slurry was instilled into left hepatic artery. Following embolization, there is significant reduction in the left hepatic arterial peripheral vasculature and sluggish flow but the left hepatic main artery remains patent. Catheter was retracted and advanced into a left hepatic branch supplying the medial segment. Additional left hepatic angiogram performed. Additional left hepatic angiogram: Irregularity of the parenchymal staining related to diffuse hepatic metastases. Scattered peripheral occlusions present in the additional left hepatic branch related to the Gel-Foam embolization. Vasospasm noted at the catheter site. No active bleeding demonstrated. Microcatheter was removed. A final angiogram was performed through the SOS 5 French catheter of the celiac origin. Celiac: Following left hepatic Gel-Foam embolization, there is significant reduction in the arterial vascularity to the the  left hepatic lobe where the biopsy was performed. Residual vasospasm present in the proximal left hepatic artery. No other significant acute vascular process. Catheter was retracted and advanced into the SMA. SMA angiogram performed. SMA angiogram: SMA is widely patent. No variant SMA supply demonstrated to the liver. On the venous phase, the superior mesenteric vein and portal vein all remain patent. Access removed. Hemostasis obtained with manual compression of the right common femoral artery puncture site. Patient tolerated the procedure well. No immediate complication. IMPRESSION: Successful celiac, hepatic, and SMA angiograms as above. Successful micro catheterization, angiograms, and Gel-Foam embolization of the left hepatic artery resulting in significant reduction in arterial flow to the area the liver were the percutaneous biopsy was performed. Electronically Signed   By: Jerilynn Mages.  Shick M.D.   On: 01/19/2017 10:01   Nm Pet Image Initial (pi) Skull Base To Thigh  Result Date: 02/04/2017 CLINICAL DATA:  Initial treatment strategy for breast cancer with liver metastases. EXAM: NUCLEAR MEDICINE PET SKULL BASE TO THIGH TECHNIQUE: 12.6 mCi F-18 FDG was injected intravenously. Full-ring PET imaging was performed from the skull base to thigh after the radiotracer. CT data was obtained and used for attenuation correction and anatomic localization. FASTING BLOOD GLUCOSE:  Value: 96 mg/dl COMPARISON:  CT chest 01/22/2017, CT abdomen pelvis 01/20/2017 and MR abdomen 01/15/2017. FINDINGS: NECK No hypermetabolic lymph nodes in the neck. CT images show no acute findings. CHEST Low left internal jugular lymph node measures 9 mm (CT image 59) with an SUV max of 2.5. No additional hypermetabolic mediastinal, hilar or axillary lymph nodes. No hypermetabolic pulmonary nodules. Atherosclerotic calcification of the arterial vasculature, including coronary arteries. No pericardial effusion. Small bilateral pleural effusions.  ABDOMEN/PELVIS Hypermetabolic lesions in the liver measure up to approximately 7.0 cm in the inferior right hepatic lobe, with an SUV max of 9.4. No abnormal hypermetabolism in the adrenal glands, spleen or pancreas. Hypermetabolic gastrohepatic ligament lymph nodes measure up to approximately 10 mm with an SUV max of 3.2. Hypermetabolic porta hepatis adenopathy is difficult to measure without IV contrast but has an SUV max 6.9. Hypermetabolic left periaortic lymph node measures 8 mm (CT image 160) with an SUV max of 5.6. Stones are seen in the gallbladder. Adrenal glands, kidneys, spleen, pancreas, stomach and bowel are grossly unremarkable. Moderate ascites. SKELETON No abnormal osseous hypermetabolism. IMPRESSION: 1. Hypermetabolic hepatic metastatic disease with hypermetabolic gastrohepatic ligament, porta hepatis and retroperitoneal lymph nodes. 2. Moderate ascites. 3. Small bilateral pleural effusions. 4. Aortic atherosclerosis (ICD10-170.0). Coronary artery calcification. 5. Cholelithiasis. Electronically Signed  By: Lorin Picket M.D.   On: 02/04/2017 10:20   US Biopsy  Result Date: 01/18/2017 INDICATION: LIVER METASTASES, UNKNOWN PRIMARY. EXAM: ULTRASOUND BIOPSY CORE LIVER MEDICATIONS: 1% LIDOCAINE ANESTHESIA/SEDATION: Moderate (conscious) sedation was employed during this procedure. A total of Versed 1.0 mg and Fentanyl 50 mcg was administered intravenously. Moderate Sedation Time: 10 minutes. The patient's level of consciousness and vital signs were monitored continuously by radiology nursing throughout the procedure under my direct supervision. FLUOROSCOPY TIME:  Fluoroscopy Time: NONE. COMPLICATIONS: None immediate. PROCEDURE: Informed written consent was obtained from the patient after a thorough discussion of the procedural risks, benefits and alternatives. All questions were addressed. Maximal Sterile Barrier Technique was utilized including caps, mask, sterile gowns, sterile gloves, sterile  drape, hand hygiene and skin antiseptic. A timeout was performed prior to the initiation of the procedure. Previous imaging reviewed. Preliminary ultrasound performed. A solid left hepatic lobe lesion was localized from a subxiphoid window. This was correlated with the abdominal MRI. Overlying skin was marked. Under sterile conditions and local anesthesia, a 17 gauge 6.8 cm access needle was advanced from an anterior subxiphoid approach into the left hepatic mass. Needle position confirmed with ultrasound. Three 1 cm 18 gauge core biopsies obtained. Samples placed in formalin. These were intact and non fragmented. Needle tract embolized with Gel-Foam. Postprocedure imaging demonstrates no hemorrhage or hematoma. Patient tolerated the biopsy well. IMPRESSION: Successful ultrasound left hepatic mass 18 gauge core biopsy Electronically Signed   By: Jerilynn Mages.  Shick M.D.   On: 01/18/2017 14:53   Ir US Guide Vasc Access Right  Result Date: 01/19/2017 INDICATION: Acute abdominal pain with acute perihepatic hemorrhage 2 hours status post percutaneous left hepatic metastasis biopsy. EXAM: SELECTIVE VISCERAL ARTERIOGRAPHY; IR EMBO ART VEN HEMORR LYMPH EXTRAV INC GUIDE ROADMAPPING; ADDITIONAL ARTERIOGRAPHY; IR ULTRASOUND GUIDANCE VASC ACCESS RIGHT MEDICATIONS: 1% lidocaine locally. ANESTHESIA/SEDATION: Moderate (conscious) sedation was employed during this procedure. A total of Versed 1.0 mg and Fentanyl 25 mcg was administered intravenously. Moderate Sedation Time: 50 minutes. The patient's level of consciousness and vital signs were monitored continuously by radiology nursing throughout the procedure under my direct supervision. CONTRAST:  1 ISOVUE-300 IOPAMIDOL (ISOVUE-300) INJECTION 61%, 1 ISOVUE-300 IOPAMIDOL (ISOVUE-300) INJECTION 61% FLUOROSCOPY TIME:  Fluoroscopy Time: 14 minutes 12 seconds (144 mGy). COMPLICATIONS: None immediate. PROCEDURE: Informed consent was obtained from the patient following explanation of the  procedure, risks, benefits and alternatives. The patient understands, agrees and consents for the procedure. All questions were addressed. A time out was performed prior to the initiation of the procedure. Maximal barrier sterile technique utilized including caps, mask, sterile gowns, sterile gloves, large sterile drape, hand hygiene, and Betadine prep. Under sterile conditions and local anesthesia, ultrasound micropuncture access performed of the right common femoral artery. Five French sheath inserted. SOS catheter was utilized to select the celiac origin. Celiac angiogram performed. Celiac: Celiac origin is widely patent. Splenic, common hepatic, gastroduodenal artery, proper hepatic, right and left hepatic arteries are all patent. Irregularity of the hepatic vasculature is secondary to hepatomegaly and diffuse hepatic metastases. No active bleeding demonstrated. On the venous phase, the splenic vein and portal vein are all patent. Through the [place] a 5 French catheter, a Renegade STC microcatheter was advanced over a micro glidewire into the proper hepatic artery. Proper hepatic angiogram: Proper hepatic, right and left hepatic arteries are patent. Vasospasm present in the proximal right hepatic artery related to wire manipulation. Limited assessment of the vasculature because of the contrast injection and poor opacification of the  left hepatic artery. Over a double angled Glidewire, the microcatheter was advanced into the left hepatic artery. Left hepatic angiogram performed. Left hepatic: Left hepatic artery is patent. This is the dominant supply to the left hepatic lobe lateral segment were the percutaneous biopsy was performed. Peripheral irregularity of the arterial vasculature related to hepatic metastases. No active extravasation, acute bleeding, or other acute vascular abnormality including AV fistula or pseudoaneurysm. Because of the significant recent acute perihepatic hemorrhage and a biopsy performed  in the left hepatic lobe lateral segment, Gel-Foam embolization of left hepatic artery will be performed. Left hepatic artery Gel-Foam embolization: Through the microcatheter, approximately 5 cc of Gel-Foam slurry was instilled into left hepatic artery. Following embolization, there is significant reduction in the left hepatic arterial peripheral vasculature and sluggish flow but the left hepatic main artery remains patent. Catheter was retracted and advanced into a left hepatic branch supplying the medial segment. Additional left hepatic angiogram performed. Additional left hepatic angiogram: Irregularity of the parenchymal staining related to diffuse hepatic metastases. Scattered peripheral occlusions present in the additional left hepatic branch related to the Gel-Foam embolization. Vasospasm noted at the catheter site. No active bleeding demonstrated. Microcatheter was removed. A final angiogram was performed through the SOS 5 French catheter of the celiac origin. Celiac: Following left hepatic Gel-Foam embolization, there is significant reduction in the arterial vascularity to the the left hepatic lobe where the biopsy was performed. Residual vasospasm present in the proximal left hepatic artery. No other significant acute vascular process. Catheter was retracted and advanced into the SMA. SMA angiogram performed. SMA angiogram: SMA is widely patent. No variant SMA supply demonstrated to the liver. On the venous phase, the superior mesenteric vein and portal vein all remain patent. Access removed. Hemostasis obtained with manual compression of the right common femoral artery puncture site. Patient tolerated the procedure well. No immediate complication. IMPRESSION: Successful celiac, hepatic, and SMA angiograms as above. Successful micro catheterization, angiograms, and Gel-Foam embolization of the left hepatic artery resulting in significant reduction in arterial flow to the area the liver were the  percutaneous biopsy was performed. Electronically Signed   By: Jerilynn Mages.  Shick M.D.   On: 01/19/2017 10:01   Dg Chest Port 1v Same Day  Result Date: 01/18/2017 CLINICAL DATA:  Acute onset of shortness of breath, vomiting and epigastric abdominal pain. Fever. Initial encounter. EXAM: PORTABLE CHEST 1 VIEW COMPARISON:  Chest radiograph performed 01/16/2017 FINDINGS: The lungs are hyperexpanded, with flattening of the hemidiaphragms, compatible with COPD. There is no evidence of focal opacification, pleural effusion or pneumothorax. The cardiomediastinal silhouette is within normal limits. No acute osseous abnormalities are seen. IMPRESSION: Findings of COPD.  Lungs remain otherwise clear. Electronically Signed   By: Garald Balding M.D.   On: 01/18/2017 23:48   Dg Abd Portable 1v  Result Date: 01/18/2017 CLINICAL DATA:  Acute onset of shortness of breath, vomiting and epigastric abdominal pain. Recent liver biopsy. Fever. Initial encounter. EXAM: PORTABLE ABDOMEN - 1 VIEW COMPARISON:  None. FINDINGS: The visualized bowel gas pattern is unremarkable. Scattered air and stool filled loops of colon are seen; no abnormal dilatation of small bowel loops is seen to suggest small bowel obstruction. No free intra-abdominal air is identified, though evaluation for free air is limited on a single supine view. The stomach is largely filled with air. The visualized osseous structures are within normal limits; the sacroiliac joints are unremarkable in appearance. IMPRESSION: Unremarkable bowel gas pattern; no free intra-abdominal air seen. Moderate amount of  stool noted in the colon. Electronically Signed   By: Garald Balding M.D.   On: 01/18/2017 23:50   US Breast Ltd Uni Left Inc Axilla  Result Date: 02/01/2017 CLINICAL DATA:  Status post liver biopsy demonstrating metastatic breast cancer. This is patient's baseline mammogram. EXAM: 2D DIGITAL DIAGNOSTIC BILATERAL MAMMOGRAM WITH CAD AND ADJUNCT TOMO ULTRASOUND LEFT BREAST  COMPARISON:  None. ACR Breast Density Category c: The breast tissue is heterogeneously dense, which may obscure small masses. FINDINGS: Bilateral 2D CC and MLO projections were obtained, with additional 3D tomosynthesis. There are no dominant masses, suspicious calcifications or secondary signs of malignancy within either breast. There is an asymmetry within the lower left breast, compatible with superimposition of normal dense fibroglandular tissues on 3D tomosynthesis images, for which ultrasound will be performed to ensure benignity. Mammographic images were processed with CAD. Targeted ultrasound is performed, evaluating the lower outer quadrant of the left breast, showing only normal fibroglandular tissues and fat lobules. There is a ridge of normal dense fibroglandular tissue within the lower outer quadrant of the left breast corresponding to the mammographic finding. IMPRESSION: No evidence of malignancy within either breast. RECOMMENDATION: Given patient's history of a left breast biopsy demonstrating metastatic breast cancer, I recommend breast MRI to evaluate for occult breast cancer. These results and recommendations were called by telephone at the time of interpretation on 02/01/2017 at 12:25 pm to Dr. Astrid Divine RAO , who verbally acknowledged these results. I have discussed the findings and recommendations with the patient. Results were also provided in writing at the conclusion of the visit. If applicable, a reminder letter will be sent to the patient regarding the next appointment. BI-RADS CATEGORY  1: Negative. Electronically Signed   By: Franki Cabot M.D.   On: 02/01/2017 13:29   Mm Diag Breast Tomo Bilateral  Result Date: 02/01/2017 CLINICAL DATA:  Status post liver biopsy demonstrating metastatic breast cancer. This is patient's baseline mammogram. EXAM: 2D DIGITAL DIAGNOSTIC BILATERAL MAMMOGRAM WITH CAD AND ADJUNCT TOMO ULTRASOUND LEFT BREAST COMPARISON:  None. ACR Breast Density Category c: The  breast tissue is heterogeneously dense, which may obscure small masses. FINDINGS: Bilateral 2D CC and MLO projections were obtained, with additional 3D tomosynthesis. There are no dominant masses, suspicious calcifications or secondary signs of malignancy within either breast. There is an asymmetry within the lower left breast, compatible with superimposition of normal dense fibroglandular tissues on 3D tomosynthesis images, for which ultrasound will be performed to ensure benignity. Mammographic images were processed with CAD. Targeted ultrasound is performed, evaluating the lower outer quadrant of the left breast, showing only normal fibroglandular tissues and fat lobules. There is a ridge of normal dense fibroglandular tissue within the lower outer quadrant of the left breast corresponding to the mammographic finding. IMPRESSION: No evidence of malignancy within either breast. RECOMMENDATION: Given patient's history of a left breast biopsy demonstrating metastatic breast cancer, I recommend breast MRI to evaluate for occult breast cancer. These results and recommendations were called by telephone at the time of interpretation on 02/01/2017 at 12:25 pm to Dr. Astrid Divine RAO , who verbally acknowledged these results. I have discussed the findings and recommendations with the patient. Results were also provided in writing at the conclusion of the visit. If applicable, a reminder letter will be sent to the patient regarding the next appointment. BI-RADS CATEGORY  1: Negative. Electronically Signed   By: Franki Cabot M.D.   On: 02/01/2017 13:29   Ir Embo Art  Lawson Fiscal Hemorr Lymph Netta Corrigan  Inc Guide Roadmapping  Result Date: 01/19/2017 INDICATION: Acute abdominal pain with acute perihepatic hemorrhage 2 hours status post percutaneous left hepatic metastasis biopsy. EXAM: SELECTIVE VISCERAL ARTERIOGRAPHY; IR EMBO ART VEN HEMORR LYMPH EXTRAV INC GUIDE ROADMAPPING; ADDITIONAL ARTERIOGRAPHY; IR ULTRASOUND GUIDANCE VASC ACCESS  RIGHT MEDICATIONS: 1% lidocaine locally. ANESTHESIA/SEDATION: Moderate (conscious) sedation was employed during this procedure. A total of Versed 1.0 mg and Fentanyl 25 mcg was administered intravenously. Moderate Sedation Time: 50 minutes. The patient's level of consciousness and vital signs were monitored continuously by radiology nursing throughout the procedure under my direct supervision. CONTRAST:  1 ISOVUE-300 IOPAMIDOL (ISOVUE-300) INJECTION 61%, 1 ISOVUE-300 IOPAMIDOL (ISOVUE-300) INJECTION 61% FLUOROSCOPY TIME:  Fluoroscopy Time: 14 minutes 12 seconds (144 mGy). COMPLICATIONS: None immediate. PROCEDURE: Informed consent was obtained from the patient following explanation of the procedure, risks, benefits and alternatives. The patient understands, agrees and consents for the procedure. All questions were addressed. A time out was performed prior to the initiation of the procedure. Maximal barrier sterile technique utilized including caps, mask, sterile gowns, sterile gloves, large sterile drape, hand hygiene, and Betadine prep. Under sterile conditions and local anesthesia, ultrasound micropuncture access performed of the right common femoral artery. Five French sheath inserted. SOS catheter was utilized to select the celiac origin. Celiac angiogram performed. Celiac: Celiac origin is widely patent. Splenic, common hepatic, gastroduodenal artery, proper hepatic, right and left hepatic arteries are all patent. Irregularity of the hepatic vasculature is secondary to hepatomegaly and diffuse hepatic metastases. No active bleeding demonstrated. On the venous phase, the splenic vein and portal vein are all patent. Through the [place] a 5 French catheter, a Renegade STC microcatheter was advanced over a micro glidewire into the proper hepatic artery. Proper hepatic angiogram: Proper hepatic, right and left hepatic arteries are patent. Vasospasm present in the proximal right hepatic artery related to wire  manipulation. Limited assessment of the vasculature because of the contrast injection and poor opacification of the left hepatic artery. Over a double angled Glidewire, the microcatheter was advanced into the left hepatic artery. Left hepatic angiogram performed. Left hepatic: Left hepatic artery is patent. This is the dominant supply to the left hepatic lobe lateral segment were the percutaneous biopsy was performed. Peripheral irregularity of the arterial vasculature related to hepatic metastases. No active extravasation, acute bleeding, or other acute vascular abnormality including AV fistula or pseudoaneurysm. Because of the significant recent acute perihepatic hemorrhage and a biopsy performed in the left hepatic lobe lateral segment, Gel-Foam embolization of left hepatic artery will be performed. Left hepatic artery Gel-Foam embolization: Through the microcatheter, approximately 5 cc of Gel-Foam slurry was instilled into left hepatic artery. Following embolization, there is significant reduction in the left hepatic arterial peripheral vasculature and sluggish flow but the left hepatic main artery remains patent. Catheter was retracted and advanced into a left hepatic branch supplying the medial segment. Additional left hepatic angiogram performed. Additional left hepatic angiogram: Irregularity of the parenchymal staining related to diffuse hepatic metastases. Scattered peripheral occlusions present in the additional left hepatic branch related to the Gel-Foam embolization. Vasospasm noted at the catheter site. No active bleeding demonstrated. Microcatheter was removed. A final angiogram was performed through the SOS 5 French catheter of the celiac origin. Celiac: Following left hepatic Gel-Foam embolization, there is significant reduction in the arterial vascularity to the the left hepatic lobe where the biopsy was performed. Residual vasospasm present in the proximal left hepatic artery. No other significant  acute vascular process. Catheter was retracted and advanced into  the SMA. SMA angiogram performed. SMA angiogram: SMA is widely patent. No variant SMA supply demonstrated to the liver. On the venous phase, the superior mesenteric vein and portal vein all remain patent. Access removed. Hemostasis obtained with manual compression of the right common femoral artery puncture site. Patient tolerated the procedure well. No immediate complication. IMPRESSION: Successful celiac, hepatic, and SMA angiograms as above. Successful micro catheterization, angiograms, and Gel-Foam embolization of the left hepatic artery resulting in significant reduction in arterial flow to the area the liver were the percutaneous biopsy was performed. Electronically Signed   By: Jerilynn Mages.  Shick M.D.   On: 01/19/2017 10:01   Ct Angio Abd/pel W/ And/or W/o  Result Date: 01/20/2017 CLINICAL DATA:  Hepatic metastases, status post ultrasound percutaneous biopsy which was complicated by acute bleeding 2 hours after the biopsy. Patient subsequently underwent Gel-Foam embolization of left hepatic artery. Despite transfusions, further decrease in hemoglobin this morning. EXAM: CT ANGIOGRAPHY ABDOMEN AND PELVIS TECHNIQUE: Multidetector CT imaging of the abdomen and pelvis was performed using the standard protocol during bolus administration of intravenous contrast. Multiplanar reconstructed images including MIPs were obtained and reviewed to evaluate the vascular anatomy. CONTRAST:  100 cc Isovue COMPARISON:  01/18/2017 FINDINGS: Arterial findings: Aorta: Scattered moderate atherosclerosis of the aorta, most pronounced in the infrarenal aorta without occlusion, aneurysm or dissection. No retroperitoneal hemorrhage. Celiac axis: Atherosclerotic origin but remains patent. Splenic, left gastric, and hepatic branches appear patent. No active bleeding or acute vascular process. Superior mesenteric: Atherosclerotic origin but remains patent. SMA branches appear  patent. No active bleeding. Left renal:           Mild atherosclerosis but widely patent. Right renal:          Mild atherosclerosis but widely patent. Inferior mesenteric:  Atherosclerotic origin but remains patent. Left iliac: Iliac atherosclerosis noted without occlusion. Left common, internal and external iliac arteries are patent. Right iliac: Similar atherosclerosis without occlusion. Right common, internal and external iliac arteries are patent. Venous findings: Hepatic veins, portal, splenic, mesenteric veins are patent. No veno-occlusive process. IVC, renal veins common iliac veins are patent. Review of the MIP images confirms the above findings. Nonvascular findings: Basilar emphysema noted. Minor atelectasis. Normal heart size. Trace pleural effusions. No pericardial effusion. Hypodense peripherally enhancing hepatic masses again evident compatible with metastases. No evidence of active bleeding or acute vascular process within the liver. No biliary dilatation. Gallbladder is collapsed but contains partially calcified gallstones. Moderate Heterogeneous perihepatic free fluid, compatible with a mixture of ascites and known hemoperitoneum related to the recent left hepatic mass biopsy. Spleen is unremarkable. Small amount of hemoperitoneum about the spleen upper pole. Adrenal glands, kidneys, ureters, and bladder demonstrate no acute process, obstruction or hydronephrosis. Negative for bowel obstruction, significant dilatation, ileus, or free air. No adenopathy Remote hysterectomy.  Pelvic ascites also evident. No inguinal abnormality or hernia. Intact abdominal wall. No rectus sheath hemorrhage or hematoma over the left hepatic biopsy site. Degenerative changes of the spine. Bones are mildly osteopenic. No acute osseous finding. IMPRESSION: Negative for acute or active intra-abdominal bleeding. Stable diffuse hepatic metastatic process. Stable perihepatic ascites and hemoperitoneum related to the recent  left hepatic mass biopsy. Intact abdominal wall without rectus sheath hematoma or hemorrhage. Aortoiliac atherosclerosis Cholelithiasis Pelvic ascites as well These results were called by telephone at the time of interpretation on 01/20/2017 at 2:13 pm to Dr. Florencia Reasons , who verbally acknowledged these results. Electronically Signed   By: Jerilynn Mages.  Shick M.D.   On:  01/20/2017 14:15   US Abdomen Limited Ruq  Result Date: 01/18/2017 CLINICAL DATA:  61 year old female with a history of multiple liver masses, status post percutaneous biopsy today's date. Interval development of abdominal pain EXAM: US ABDOMEN LIMITED - RIGHT UPPER QUADRANT COMPARISON:  Ultrasound 01/18/2017, MRI 01/15/2017 FINDINGS: Gallbladder: Contracted gallbladder with echogenic focus in the lumen compatible with cholelithiasis. Common bile duct: Diameter: 4 mm -5 mm Liver: Re- demonstration of mass within right liver lobe 7 point far cm by 5.4 cm x 7.4 cm. The biopsy of the left liver lobe on the comparison ultrasound today's date demonstrates small echogenic focus, compatible with hemostatic material. There is hyperechoic free fluid adjacent to the liver margin, both anterior and posterior to the left liver lobe. IMPRESSION: New hyperechoic free fluid adjacent to the left liver lobe, status post percutaneous biopsy, compatible with hemorrhage. Re- demonstration of liver masses, better characterized on prior MRI. These results were called by telephone at the time of interpretation on 01/18/2017 at 5:42 pm to Dr. Annamaria Boots who verbally acknowledged these results. Electronically Signed   By: Corrie Mckusick D.O.   On: 01/18/2017 17:43   US Abdomen Limited Ruq  Result Date: 01/11/2017 CLINICAL DATA:  Right upper quadrant pain . EXAM: US ABDOMEN LIMITED - RIGHT UPPER QUADRANT COMPARISON:  No recent prior . FINDINGS: Gallbladder: Multiple gallstones are noted. Gallstone stones measure up to 1.1 cm. Gallbladder wall is thickened at 1.1 cm. Cholecystitis cannot  be excluded. Negative Murphy sign. Common bile duct: Diameter: 2.0 mm Liver: Multiple solid hepatic lesions are noted. Largest lesion measures 9.7 cm and is in the right hepatic lobe. These findings are highly suspicious for metastatic disease. A pancreatic head mass cannot be excluded. Appear pancreatic adenopathy cannot be excluded. Portal vein is narrowed most likely from periportal adenopathy. Portal vein however is patent. IMPRESSION: 1. Multiple gallstones. Gallbladder wall is thickened at 1.1 cm. Cholecystitis cannot be excluded. 2. Multiple solid hepatic lesions. The largest measures 9.7 cm. Pancreatic mass appears to be present. Pancreatic solid lesions noted most likely adenopathy. Portal vein is narrowed possibly from periportal adenopathy. Portal vein however is patent. These findings together suggest malignancy with metastatic disease. Further evaluation with gadolinium-enhanced MRI of the abdomen should be considered. Electronically Signed   By: Marcello Moores  Register   On: 01/11/2017 12:19    Labs:  CBC:  Recent Labs  01/22/17 0450 01/23/17 1047 01/28/17 1611 02/06/17 0949  WBC 24.5* 27.4* 42.6* 30.5*  HGB 8.5* 8.9* 9.7* 8.8*  HCT 28.3* 29.6* 31.4* 26.5*  PLT 464* 521* 776* 682*    COAGS:  Recent Labs  01/18/17 0005 01/18/17 1134 02/06/17 0949  INR 1.27 1.11 1.28  APTT 32 36 35    BMP:  Recent Labs  01/21/17 0352 01/22/17 0450 01/28/17 1611 02/04/17 0918  NA 135 135 128* 128*  K 3.5 4.0 3.3* 4.9  CL 94* 94* 87* 91*  CO2 33* 30 27 27   GLUCOSE 88 87 89 91  BUN <5* <5* 7 6  CALCIUM 8.4* 8.4* 8.4* 8.4*  CREATININE 0.37* 0.36* 0.51 0.78  GFRNONAA >60 >60 >60 >60  GFRAA >60 >60 >60 >60    LIVER FUNCTION TESTS:  Recent Labs  01/20/17 0511 01/21/17 0352 01/22/17 0450 01/28/17 1611  BILITOT 0.3 0.4 0.6 0.9  AST 56* 37 31 53*  ALT 20 17 16 25   ALKPHOS 138* 130* 134* 253*  PROT 6.3* 5.8* 6.0* 7.0  ALBUMIN 1.9* 1.7* 1.7* 2.1*    TUMOR MARKERS:  Recent  Labs  01/19/17 1137  AFPTM 4.2  CEA 6.9*    Assessment and Plan:  Breast cancer. Leukocytosis is presumably from malignancy. Port placement to follow.  Thank you for this interesting consult.  I greatly enjoyed meeting Rachel Butler and look forward to participating in their care.  A copy of this report was sent to the requesting provider on this date.  Electronically Signed: [place] Millon, ART A 02/06/2017, 10:35 AM   I spent a total of  40 Minutes   in face to face in clinical consultation, greater than 50% of which was counseling/coordinating care for port placement.

## 2017-02-06 NOTE — Procedures (Signed)
RIJV PAC SVC RA No comp/EBL 

## 2017-02-07 ENCOUNTER — Other Ambulatory Visit: Payer: Medicare Other

## 2017-02-07 ENCOUNTER — Telehealth: Payer: Self-pay | Admitting: *Deleted

## 2017-02-07 DIAGNOSIS — F17210 Nicotine dependence, cigarettes, uncomplicated: Secondary | ICD-10-CM | POA: Diagnosis not present

## 2017-02-07 DIAGNOSIS — G894 Chronic pain syndrome: Secondary | ICD-10-CM | POA: Diagnosis not present

## 2017-02-07 DIAGNOSIS — M5090 Cervical disc disorder, unspecified, unspecified cervical region: Secondary | ICD-10-CM | POA: Diagnosis not present

## 2017-02-07 DIAGNOSIS — D376 Neoplasm of uncertain behavior of liver, gallbladder and bile ducts: Secondary | ICD-10-CM | POA: Diagnosis not present

## 2017-02-07 DIAGNOSIS — G43909 Migraine, unspecified, not intractable, without status migrainosus: Secondary | ICD-10-CM | POA: Diagnosis not present

## 2017-02-07 DIAGNOSIS — M5136 Other intervertebral disc degeneration, lumbar region: Secondary | ICD-10-CM | POA: Diagnosis not present

## 2017-02-07 NOTE — Telephone Encounter (Signed)
PTs husband who is her primary caregiver 24/7 came in because he got a jury summons. He would like to get a letter to try to get out of it due to the fact that his wife needs 24/7 care and he is the only caregiver. Letter placed in [facility] tower. Please contact him when it is ready [phone]

## 2017-02-07 NOTE — Telephone Encounter (Signed)
Not Dr. Synthia Innocent patient. Sees Dr. Silvio Pate Husband isn't a patient here.

## 2017-02-08 ENCOUNTER — Inpatient Hospital Stay (HOSPITAL_BASED_OUTPATIENT_CLINIC_OR_DEPARTMENT_OTHER): Payer: Medicare Other | Admitting: Oncology

## 2017-02-08 ENCOUNTER — Encounter: Payer: Self-pay | Admitting: Oncology

## 2017-02-08 ENCOUNTER — Encounter: Payer: Self-pay | Admitting: *Deleted

## 2017-02-08 ENCOUNTER — Encounter: Payer: Self-pay | Admitting: Internal Medicine

## 2017-02-08 ENCOUNTER — Inpatient Hospital Stay: Payer: Medicare Other

## 2017-02-08 VITALS — BP 132/77 | HR 100 | Temp 98.3°F | Resp 20

## 2017-02-08 DIAGNOSIS — C787 Secondary malignant neoplasm of liver and intrahepatic bile duct: Secondary | ICD-10-CM | POA: Diagnosis not present

## 2017-02-08 DIAGNOSIS — K8020 Calculus of gallbladder without cholecystitis without obstruction: Secondary | ICD-10-CM

## 2017-02-08 DIAGNOSIS — J9601 Acute respiratory failure with hypoxia: Secondary | ICD-10-CM

## 2017-02-08 DIAGNOSIS — R160 Hepatomegaly, not elsewhere classified: Secondary | ICD-10-CM

## 2017-02-08 DIAGNOSIS — R5383 Other fatigue: Secondary | ICD-10-CM

## 2017-02-08 DIAGNOSIS — R5381 Other malaise: Secondary | ICD-10-CM

## 2017-02-08 DIAGNOSIS — Z809 Family history of malignant neoplasm, unspecified: Secondary | ICD-10-CM

## 2017-02-08 DIAGNOSIS — Z7189 Other specified counseling: Secondary | ICD-10-CM

## 2017-02-08 DIAGNOSIS — Z880 Allergy status to penicillin: Secondary | ICD-10-CM

## 2017-02-08 DIAGNOSIS — R1011 Right upper quadrant pain: Secondary | ICD-10-CM

## 2017-02-08 DIAGNOSIS — R188 Other ascites: Secondary | ICD-10-CM | POA: Diagnosis not present

## 2017-02-08 DIAGNOSIS — D649 Anemia, unspecified: Secondary | ICD-10-CM | POA: Insufficient documentation

## 2017-02-08 DIAGNOSIS — C50919 Malignant neoplasm of unspecified site of unspecified female breast: Secondary | ICD-10-CM

## 2017-02-08 DIAGNOSIS — I700 Atherosclerosis of aorta: Secondary | ICD-10-CM

## 2017-02-08 DIAGNOSIS — Z5111 Encounter for antineoplastic chemotherapy: Secondary | ICD-10-CM | POA: Diagnosis not present

## 2017-02-08 DIAGNOSIS — K869 Disease of pancreas, unspecified: Secondary | ICD-10-CM

## 2017-02-08 DIAGNOSIS — I2510 Atherosclerotic heart disease of native coronary artery without angina pectoris: Secondary | ICD-10-CM

## 2017-02-08 DIAGNOSIS — Z90710 Acquired absence of both cervix and uterus: Secondary | ICD-10-CM

## 2017-02-08 DIAGNOSIS — R531 Weakness: Secondary | ICD-10-CM | POA: Diagnosis not present

## 2017-02-08 DIAGNOSIS — G629 Polyneuropathy, unspecified: Secondary | ICD-10-CM

## 2017-02-08 DIAGNOSIS — G893 Neoplasm related pain (acute) (chronic): Secondary | ICD-10-CM | POA: Diagnosis not present

## 2017-02-08 DIAGNOSIS — M519 Unspecified thoracic, thoracolumbar and lumbosacral intervertebral disc disorder: Secondary | ICD-10-CM

## 2017-02-08 DIAGNOSIS — D509 Iron deficiency anemia, unspecified: Secondary | ICD-10-CM

## 2017-02-08 DIAGNOSIS — I739 Peripheral vascular disease, unspecified: Secondary | ICD-10-CM

## 2017-02-08 DIAGNOSIS — J449 Chronic obstructive pulmonary disease, unspecified: Secondary | ICD-10-CM | POA: Diagnosis not present

## 2017-02-08 DIAGNOSIS — Z79899 Other long term (current) drug therapy: Secondary | ICD-10-CM

## 2017-02-08 DIAGNOSIS — E871 Hypo-osmolality and hyponatremia: Secondary | ICD-10-CM

## 2017-02-08 DIAGNOSIS — Z90722 Acquired absence of ovaries, bilateral: Secondary | ICD-10-CM

## 2017-02-08 DIAGNOSIS — Z9981 Dependence on supplemental oxygen: Secondary | ICD-10-CM

## 2017-02-08 DIAGNOSIS — I5032 Chronic diastolic (congestive) heart failure: Secondary | ICD-10-CM | POA: Diagnosis not present

## 2017-02-08 DIAGNOSIS — F17210 Nicotine dependence, cigarettes, uncomplicated: Secondary | ICD-10-CM

## 2017-02-08 LAB — CBC WITH DIFFERENTIAL/PLATELET
BASOS ABS: 0.1 10*3/uL (ref 0–0.1)
Basophils Relative: 0 %
Eosinophils Absolute: 0.1 10*3/uL (ref 0–0.7)
Eosinophils Relative: 0 %
HEMATOCRIT: 25.3 % — AB (ref 35.0–47.0)
Hemoglobin: 8.1 g/dL — ABNORMAL LOW (ref 12.0–16.0)
Lymphocytes Relative: 6 %
Lymphs Abs: 1.4 10*3/uL (ref 1.0–3.6)
MCH: 26.1 pg (ref 26.0–34.0)
MCHC: 31.9 g/dL — ABNORMAL LOW (ref 32.0–36.0)
MCV: 81.6 fL (ref 80.0–100.0)
Monocytes Absolute: 1.8 10*3/uL — ABNORMAL HIGH (ref 0.2–0.9)
Monocytes Relative: 8 %
NEUTROS ABS: 19.5 10*3/uL — AB (ref 1.4–6.5)
NEUTROS PCT: 86 %
Platelets: 492 10*3/uL — ABNORMAL HIGH (ref 150–440)
RBC: 3.1 MIL/uL — AB (ref 3.80–5.20)
RDW: 21.2 % — ABNORMAL HIGH (ref 11.5–14.5)
WBC: 22.9 10*3/uL — AB (ref 3.6–11.0)

## 2017-02-08 LAB — COMPREHENSIVE METABOLIC PANEL
ALBUMIN: 1.7 g/dL — AB (ref 3.5–5.0)
ALT: 29 U/L (ref 14–54)
ANION GAP: 5 (ref 5–15)
AST: 61 U/L — ABNORMAL HIGH (ref 15–41)
Alkaline Phosphatase: 195 U/L — ABNORMAL HIGH (ref 38–126)
BILIRUBIN TOTAL: 0.5 mg/dL (ref 0.3–1.2)
BUN: 6 mg/dL (ref 6–20)
CO2: 29 mmol/L (ref 22–32)
Calcium: 7.9 mg/dL — ABNORMAL LOW (ref 8.9–10.3)
Chloride: 92 mmol/L — ABNORMAL LOW (ref 101–111)
Creatinine, Ser: 0.47 mg/dL (ref 0.44–1.00)
Glucose, Bld: 110 mg/dL — ABNORMAL HIGH (ref 65–99)
Potassium: 4.5 mmol/L (ref 3.5–5.1)
Sodium: 126 mmol/L — ABNORMAL LOW (ref 135–145)
TOTAL PROTEIN: 5.6 g/dL — AB (ref 6.5–8.1)

## 2017-02-08 LAB — OSMOLALITY, URINE: OSMOLALITY UR: 351 mosm/kg (ref 300–900)

## 2017-02-08 LAB — OSMOLALITY: OSMOLALITY: 266 mosm/kg — AB (ref 275–295)

## 2017-02-08 LAB — SODIUM, URINE, RANDOM: Sodium, Ur: <10 mmol/L

## 2017-02-08 MED ORDER — FAMOTIDINE IN NACL 20-0.9 MG/50ML-% IV SOLN
20.0000 mg | Freq: Once | INTRAVENOUS | Status: AC
Start: 2017-02-08 — End: 2017-02-08
  Administered 2017-02-08: 20 mg via INTRAVENOUS
  Filled 2017-02-08: qty 50

## 2017-02-08 MED ORDER — DEXTROSE 5 % IV SOLN
60.0000 mg/m2 | Freq: Once | INTRAVENOUS | Status: AC
  Administered 2017-02-08: 90 mg via INTRAVENOUS
  Filled 2017-02-08: qty 15

## 2017-02-08 MED ORDER — SODIUM CHLORIDE 0.9 % IV SOLN
510.0000 mg | Freq: Once | INTRAVENOUS | Status: AC
  Administered 2017-02-08: 510 mg via INTRAVENOUS
  Filled 2017-02-08: qty 17

## 2017-02-08 MED ORDER — SODIUM CHLORIDE 0.9 % IV SOLN
Freq: Once | INTRAVENOUS | Status: AC
  Administered 2017-02-08: 12:00:00 via INTRAVENOUS
  Filled 2017-02-08: qty 1000

## 2017-02-08 MED ORDER — PALONOSETRON HCL INJECTION 0.25 MG/5ML
0.2500 mg | Freq: Once | INTRAVENOUS | Status: AC
  Administered 2017-02-08: 0.25 mg via INTRAVENOUS
  Filled 2017-02-08: qty 5

## 2017-02-08 MED ORDER — ALBUTEROL SULFATE (2.5 MG/3ML) 0.083% IN NEBU
INHALATION_SOLUTION | RESPIRATORY_TRACT | Status: AC
  Filled 2017-02-08: qty 3

## 2017-02-08 MED ORDER — DIPHENHYDRAMINE HCL 50 MG/ML IJ SOLN
50.0000 mg | Freq: Once | INTRAMUSCULAR | Status: AC
  Administered 2017-02-08: 50 mg via INTRAVENOUS
  Filled 2017-02-08: qty 1

## 2017-02-08 MED ORDER — SODIUM CHLORIDE 0.9 % IV SOLN
20.0000 mg | Freq: Once | INTRAVENOUS | Status: AC
  Administered 2017-02-08: 20 mg via INTRAVENOUS
  Filled 2017-02-08: qty 2

## 2017-02-08 MED ORDER — SODIUM CHLORIDE 0.9 % IV SOLN
Freq: Once | INTRAVENOUS | Status: AC
  Filled 2017-02-08: qty 1000

## 2017-02-08 MED ORDER — HEPARIN SOD (PORK) LOCK FLUSH 100 UNIT/ML IV SOLN
500.0000 [IU] | Freq: Once | INTRAVENOUS | Status: AC
  Administered 2017-02-08: 500 [IU] via INTRAVENOUS

## 2017-02-08 MED ORDER — SODIUM CHLORIDE 0.9 % IV SOLN
Freq: Once | INTRAVENOUS | Status: AC
  Administered 2017-02-08: 11:00:00 via INTRAVENOUS
  Filled 2017-02-08: qty 1000

## 2017-02-08 MED ORDER — HYDROCORTISONE NA SUCCINATE PF 100 MG IJ SOLR
100.0000 mg | Freq: Once | INTRAMUSCULAR | Status: AC
  Administered 2017-02-08: 100 mg via INTRAVENOUS

## 2017-02-08 MED ORDER — SODIUM CHLORIDE 0.9 % IV SOLN
164.2000 mg | Freq: Once | INTRAVENOUS | Status: AC
  Administered 2017-02-08: 160 mg via INTRAVENOUS
  Filled 2017-02-08: qty 16

## 2017-02-08 MED ORDER — SODIUM CHLORIDE 0.9% FLUSH
10.0000 mL | INTRAVENOUS | Status: DC | PRN
  Administered 2017-02-08: 10 mL via INTRAVENOUS
  Filled 2017-02-08: qty 10

## 2017-02-08 MED ORDER — ALBUTEROL SULFATE (2.5 MG/3ML) 0.083% IN NEBU
2.5000 mg | INHALATION_SOLUTION | Freq: Once | RESPIRATORY_TRACT | Status: AC | PRN
  Administered 2017-02-08: 2.5 mg via RESPIRATORY_TRACT

## 2017-02-08 NOTE — Progress Notes (Unsigned)
Pt sats after chemotherapy at room air at rest was 85%. 02 applied per nasal canula of 2 liters and will evaluate for home oxygen

## 2017-02-08 NOTE — Telephone Encounter (Signed)
I left a message on patient's husband's voice mail that letter is ready for pick up.

## 2017-02-08 NOTE — Addendum Note (Signed)
Addended by: Luella Cook on: 02/08/2017 04:45 PM   Modules accepted: Orders

## 2017-02-08 NOTE — Progress Notes (Signed)
1330 - started Taxol at 1325, patient complains of feeling "hot" in her chin/neck area.  No sore throat reported or any other symptoms per patient.  Stopped Taxol, increased NS, took vitals (see Flowsheet), called Dr. Janese Banks, gave meds (see MAR) per Dr. Janese Banks.    13:45 - patient given albuterol breathing treatment, per Dr. Janese Banks.  Dr. Janese Banks said to give her 15 minutes after treatment and restart Taxol.   14:15 - restarted Taxol, no further symptoms noted by the patient.

## 2017-02-08 NOTE — Telephone Encounter (Signed)
Letter done No charge 

## 2017-02-08 NOTE — Progress Notes (Signed)
Patient's BP decreased today.  States she was placed on Beta blocker and it has been running low since then.  Also states she took her last K+ pill this morning.  If she is to continue taking, she will need refills.

## 2017-02-08 NOTE — Progress Notes (Signed)
Taxol restarted at 1415. Pt's O2 80 upon restarting. 2L O2 [place] applied. Sats 94-95% on 2L. No other symptoms present. No complaints from patient. Dr Janese Banks notified and stated continue with Taxol unless sats drop again. Keep patient on O2 at 2L via [place].  1430. Patient's sats 96% on 2L . Pt resting comfortably, offering no complaints. Will continue to monitor

## 2017-02-08 NOTE — Progress Notes (Addendum)
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  2. Ultrasound showed multiple gallstones. Multiple solid hepatic lesions area and the largest measures 9.7 cm. Pancreatic mass appears to be present. Pancreatic solid lesions noted most likely adenopathy. Portal vein is narrowed possibly from periportal adenopathy.  3. MRI of the abdomen on 01/15/2017 showed enlargement of the liver and contains multiple ill-defined heterogeneous and peripheral enhancing lesions worrisome for multifocal metastatic disease. The largest lesion is in the medial segment of the left lobe measuring 6 cm. Lesion within segment 8 measures 3.2 cm segment 5 lesion measures 3.1 cm. The gallbladder fundus extends into the large mass within the medial segment of the left lobe. No biliary dilation. No inflammation or mass in the pancreas. Enlarged portocaval and porta hepatic lymph nodes worrisome for metastatic adenopathy.  4. Patient underwent ultrasound-guided liver biopsy which showed metastatic adenocarcinoma. Malignant cells are positive for CK 7 and GATA 3 as well as focal positive for estrogen. ER 40% positive PR negative. Cells are negative for CK 20, CDX2, TTF1, PAX8, napsin A, p53, WT 1 This is favored to represent breast primary  5. Post liver biopsy patient had acute bleeding for about 2 hours and underwent Gelfoam embolization of the left hepatic artery. CTA of the  abdomen did not reveal any acute or active intra-abdominal bleeding. Stable diffuse hepatic metastatic process. Stable perihepatic ascites and hemoperitoneum related to recent left hepatic mass biopsy. Intact abdominal wall without rectus sheath hematoma or hemorrhage. Pelvic ascites. Pelvic ascites.  6. CTA of the chest showed no pulmonary embolus. Centrilobular emphysema. Secretions versus small pretracheal nodules. Short-term follow-up CT or bronchoscopy would be useful for further evaluation.   7. There was also a concern for endocarditis which was ruled out with TEE.   8. PET/CT from 02/04/17 showed: IMPRESSION: 1. Hypermetabolic hepatic metastatic disease with hypermetabolic gastrohepatic ligament, porta hepatis and retroperitoneal lymph nodes. 2. Moderate ascites. 3. Small bilateral pleural effusions. 4. Aortic atherosclerosis (ICD10-170.0). Coronary artery calcification. 5. Cholelithiasis.  9. Mammogram showed no breast mass. MRI breast pending. MRI brain was negative for metastases  10. Patients case was discussed at tumor board and pathology from [facility] reviewed it for second opinion as well. IHC consistent with breast primary although no primary breast mass. Tissue of origin test sent for confirmation  Interval history- feels poorly. She is fatigued. Pain meds helping with abdominal pain  ECOG PS- 2-3 Pain scale- 0 Opioid associated constipation- no  Review of systems- Review of Systems  Constitutional: Positive for malaise/fatigue. Negative for chills, fever and weight loss.  HENT: Negative for congestion, ear discharge and nosebleeds.   Eyes: Negative for blurred vision.  Respiratory: Negative for cough, hemoptysis, sputum production, shortness of breath and wheezing.   Cardiovascular: Negative for chest pain, palpitations, orthopnea and claudication.  Gastrointestinal: Positive for abdominal pain. Negative for blood in stool, constipation, diarrhea, heartburn,  melena, nausea and vomiting.  Genitourinary: Negative for dysuria, flank pain, frequency, hematuria and urgency.  Musculoskeletal: Negative for back  pain, joint pain and myalgias.  Skin: Negative for rash.  Neurological: Positive for weakness. Negative for dizziness, tingling, focal weakness, seizures and headaches.  Endo/Heme/Allergies: Does not bruise/bleed easily.  Psychiatric/Behavioral: Negative for depression and suicidal ideas. The patient does not have insomnia.      Current treatment- to start today  Allergies  Allergen Reactions  . Indomethacin Other (See Comments)    Can not urinate  . Penicillins Rash  . Robaxin [Methocarbamol] Other (See Comments)    Urinary retention  . Nubain [Nalbuphine Hcl]      Past Medical History:  Diagnosis Date  . Anemia   . Arthritis   . Blood transfusion without reported diagnosis   . Cancer ([place])   . Cervical disc disease   . COPD (chronic obstructive pulmonary disease) ([place])   . Intervertebral disk disease   . Lumbar disc disease   . Migraines   . Nicotine dependence   . UTI (urinary tract infection)      Past Surgical History:  Procedure Laterality Date  . ABDOMINAL HYSTERECTOMY  1986   right tube and ovary removed--then laparoscopy after for ?retained cyst?  . CERVICAL DISCECTOMY  1993   C5-6  . CESAREAN SECTION  [id number]  . COLONOSCOPY    . disk removal     c5c6  . IR GENERIC HISTORICAL  01/18/2017   IR EMBO ART  VEN HEMORR LYMPH EXTRAV  INC GUIDE ROADMAPPING 01/18/2017 Greggory Keen, MD [facility]  . IR GENERIC HISTORICAL  01/18/2017   IR ANGIOGRAM SELECTIVE EACH ADDITIONAL VESSEL 01/18/2017 Greggory Keen, MD [facility]  . IR GENERIC HISTORICAL  01/18/2017   IR ANGIOGRAM SELECTIVE EACH ADDITIONAL VESSEL 01/18/2017 Greggory Keen, MD [facility]  . IR GENERIC HISTORICAL  01/18/2017   IR US GUIDE VASC ACCESS RIGHT 01/18/2017 Greggory Keen, MD [facility]  . IR GENERIC HISTORICAL  01/18/2017   IR ANGIOGRAM VISCERAL  SELECTIVE 01/18/2017 Greggory Keen, MD [facility]  . IR GENERIC HISTORICAL  01/18/2017   IR ANGIOGRAM SELECTIVE EACH ADDITIONAL VESSEL 01/18/2017 Greggory Keen, MD [facility]  . IR GENERIC HISTORICAL  01/18/2017   IR ANGIOGRAM VISCERAL SELECTIVE 01/18/2017 Greggory Keen, MD [facility]  . IR GENERIC HISTORICAL  02/06/2017   IR FLUORO GUIDE PORT INSERTION RIGHT 02/06/2017 Marybelle Killings, MD [facility]-INTER[facility] RAD  . TEE WITHOUT CARDIOVERSION N/A 01/24/2017   Procedure: TRANSESOPHAGEAL ECHOCARDIOGRAM (TEE)  WITH ANESTHESIA;  Surgeon: Sueanne Margarita, MD;  Location: [place];  Service: Cardiovascular;  Laterality: N/A;  . [place]  . TUBAL LIGATION      Social History   Social History  . Marital status: Married    Spouse name: N/A  . Number of children: 2  . Years of education: N/A   Occupational History  . Social research officer, government ---retired   . 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 Smoking status: Current Every Day Smoker    Packs/day: 1.00    Years: 40.00    Types: Cigarettes  . Smokeless tobacco: Never Used  . Alcohol use No  . Drug use: No  . Sexual activity: Not on file   Other Topics Concern  . Not on file   Social History Narrative   [name] daughter      Has living will   Husband is health care POA   Would accept resuscitation but no prolonged ventilation   No tube feeds if cognitively unaware  Family History  Problem Relation Age of Onset  . Cancer Mother   . COPD Father   . Heart disease Father   . Hypertension Father   . Thyroid disease Other   . Cancer Other     mother's side  . Heart disease Other     Dad's side  . Colon cancer Neg Hx      Current Outpatient Prescriptions:  .  BIOTIN 5000 PO, Take by mouth daily., Disp: , Rfl:  .  dexamethasone (DECADRON) 4 MG tablet, Take 2 tablets (8 mg total) by mouth daily. Start the day after chemotherapy for 2 days., Disp: 30 tablet, Rfl: 1 .  fentaNYL (DURAGESIC -  DOSED MCG/HR) 25 MCG/HR patch, Place 25 mcg onto the skin every 3 (three) days. , Disp: , Rfl: 0 .  gabapentin (NEURONTIN) 800 MG tablet, Take 1 tablet (800 mg total) by mouth 3 (three) times daily., Disp: 90 tablet, Rfl: 11 .  lidocaine-prilocaine (EMLA) cream, Apply 1 application topically as needed. Apply small amount to port site at least 1 hour prior to it being accessed, cover with plastic wrap, Disp: 30 g, Rfl: 1 .  lidocaine-prilocaine (EMLA) cream, Apply to affected area once, Disp: 30 g, Rfl: 3 .  LORazepam (ATIVAN) 0.5 MG tablet, Take 1 tablet (0.5 mg total) by mouth every 6 (six) hours as needed (Nausea or vomiting)., Disp: 30 tablet, Rfl: 0 .  Melatonin 2.5 MG CAPS, Take 1 capsule by mouth at bedtime., Disp: , Rfl:  .  metoprolol tartrate (LOPRESSOR) 25 MG tablet, Take 0.5 tablets (12.5 mg total) by mouth 2 (two) times daily., Disp: 60 tablet, Rfl: 0 .  Multiple Vitamins-Minerals (MULTIVITAMIN WITH MINERALS) tablet, Take 1 tablet by mouth daily., Disp: , Rfl:  .  ondansetron (ZOFRAN) 8 MG tablet, Take 1 tablet (8 mg total) by mouth 2 (two) times daily as needed for refractory nausea / vomiting. Start on day 3 after chemo., Disp: 30 tablet, Rfl: 1 .  Oxycodone HCl 10 MG TABS, Take 1 tablet (10 mg total) by mouth every 6 (six) hours as needed., Disp: 56 tablet, Rfl: 0 .  polyethylene glycol (MIRALAX / GLYCOLAX) packet, Take 17 g by mouth daily., Disp: 14 each, Rfl: 0 .  potassium chloride SA (K-DUR,KLOR-CON) 20 MEQ tablet, Take 1 tablet (20 mEq total) by mouth 2 (two) times daily., Disp: 20 tablet, Rfl: 0 .  prochlorperazine (COMPAZINE) 10 MG tablet, Take 1 tablet (10 mg total) by mouth every 6 (six) hours as needed (Nausea or vomiting)., Disp: 30 tablet, Rfl: 1 .  promethazine (PHENERGAN) 25 MG tablet, TAKE 1/2 TO 1 TABLETS BY MOUTH 3 TIMES DAILY AS NEEDED FOR NAUSEA, Disp: 30 tablet, Rfl: 0 .  sennosides-docusate sodium (SENOKOT-S) 8.6-50 MG tablet, Take 2 tablets by mouth daily., Disp: ,  Rfl:  .  tiZANidine (ZANAFLEX) 4 MG capsule, Take 4 mg by mouth 3 (three) times daily. Takes 1 tid and up to 3 tablets at bedtime to sleep, Disp: , Rfl:   Physical exam:  Vitals:   02/08/17 0934  BP: 91/61  Pulse: 92  Resp: 18  Temp: 98.3 F (36.8 C)  TempSrc: Tympanic  Weight: 107 lb 5.8 oz (48.7 kg)   Physical Exam  Constitutional: She is oriented to person, place, and time.  Fatigued, cachectic, sitting in a wheelchair  HENT:  Head: Normocephalic and atraumatic.  Eyes: EOM are normal. Pupils are equal, round, and reactive to light.  Neck: Normal range of motion.  Cardiovascular:  Normal rate, regular rhythm and normal heart sounds.   Pulmonary/Chest: Effort normal and breath sounds normal.  Abdominal: Soft. Bowel sounds are normal.  Palpable hepatomegaly  Neurological: She is alert and oriented to person, place, and time.  Skin: Skin is warm and dry.     CMP Latest Ref Rng & Units 02/04/2017  Glucose 65 - 99 mg/dL 91  BUN 6 - 20 mg/dL 6  Creatinine 0.44 - 1.00 mg/dL 0.78  Sodium 135 - 145 mmol/L 128(L)  Potassium 3.5 - 5.1 mmol/L 4.9  Chloride 101 - 111 mmol/L 91(L)  CO2 22 - 32 mmol/L 27  Calcium 8.9 - 10.3 mg/dL 8.4(L)  Total Protein 6.5 - 8.1 g/dL -  Total Bilirubin 0.3 - 1.2 mg/dL -  Alkaline Phos 38 - 126 U/L -  AST 15 - 41 U/L -  ALT 14 - 54 U/L -   CBC Latest Ref Rng & Units 02/06/2017  WBC 3.6 - 11.0 K/uL 30.5(H)  Hemoglobin 12.0 - 16.0 g/dL 8.8(L)  Hematocrit 35.0 - 47.0 % 26.5(L)  Platelets 150 - 440 K/uL 682(H)    No images are attached to the encounter.  Dg Chest 2 View  Result Date: 01/16/2017 CLINICAL DATA:  Liver metastases.  Tobacco use. EXAM: CHEST  2 VIEW COMPARISON:  August 25, 2015 FINDINGS: There are nipple shadows bilaterally. There is no evident edema or consolidation. No pulmonary nodular lesion is demonstrable by radiography. Heart size and pulmonary vascularity are normal. No adenopathy. There is atherosclerotic calcification in the  aorta. No blastic or lytic bone lesions. IMPRESSION: Aortic atherosclerosis. No edema or consolidation. No evident mass or adenopathy. Electronically Signed   By: Lowella Grip III M.D.   On: 01/16/2017 14:34   Ct Angio Chest Pe W Or Wo Contrast  Addendum Date: 01/23/2017   ADDENDUM REPORT: 01/23/2017 14:00 ADDENDUM: Voice recognition error: The third sentence of the Findings section should read "There is no focal filling defect to suggest pulmonary embolus." Electronically Signed   By: San Morelle M.D.   On: 01/23/2017 14:00   Result Date: 01/23/2017 CLINICAL DATA:  Hypoxia. EXAM: CT ANGIOGRAPHY CHEST WITH CONTRAST TECHNIQUE: Multidetector CT imaging of the chest was performed using the standard protocol during bolus administration of intravenous contrast. Multiplanar CT image reconstructions and MIPs were obtained to evaluate the vascular anatomy. CONTRAST:  80 mL Isovue 370 COMPARISON:  One-view chest x-ray 01/18/2017. CT of the abdomen and pelvis 01/20/2017 FINDINGS: Cardiovascular: The heart size is normal. The pulmonary arteries demonstrate excellent opacification. There is focal filling defect to suggest pulmonary embolus. Pulmonary arterial size is normal. The aorta demonstrates atherosclerotic change without aneurysm. A 4 vessel arch configuration is present. The left vertebral artery Mediastinum/Nodes: No significant mediastinal or axillary adenopathy is present. The esophagus is unremarkable. Secretions versus small tracheal nodules are evident. Lungs/Pleura: Centrilobular emphysema is present. No focal nodule or mass lesion is present. Mild dependent atelectasis is present bilaterally. There are small bilateral pleural effusions. Upper Abdomen: Liver metastases are again noted. Musculoskeletal: No focal lytic or blastic lesions are present. Vertebral body heights alignment are maintained. Review of the MIP images confirms the above findings. IMPRESSION: 1. No pulmonary embolus. 2.  Centrilobular emphysema. 3. Secretions versus small tracheal nodules. Short-term follow-up CT scan of or bronchoscopy would be useful for further evaluation. 4. Aortic atherosclerosis. 5. Liver metastases. Electronically Signed: By: San Morelle M.D. On: 01/22/2017 09:53   Mr Jeri Cos PP Contrast  Result Date: 02/05/2017 CLINICAL DATA:  Breast cancer  with liver metastases. EXAM: MRI HEAD WITHOUT AND WITH CONTRAST TECHNIQUE: Multiplanar, multiecho pulse sequences of the brain and surrounding structures were obtained without and with intravenous contrast. CONTRAST:  40m MULTIHANCE GADOBENATE DIMEGLUMINE 529 MG/ML IV SOLN COMPARISON:  PET CT 02/04/2017 FINDINGS: Brain: No focal diffusion restriction to indicate acute infarct. No intraparenchymal hemorrhage. There is minimal multifocal hyperintense T2-weighted signal within the periventricular white matter, which may be seen in the setting of migraine headaches or early chronic microvascular disease; however, it is also seen in normal patients of this age. No mass lesion or midline shift. No hydrocephalus or extra-axial fluid collection. The midline structures are normal. No age advanced or lobar predominant atrophy. Vascular: Major intracranial arterial and venous sinus flow voids are preserved. No evidence of chronic microhemorrhage or amyloid angiopathy. Skull and upper cervical spine: The visualized skull base, calvarium, upper cervical spine and extracranial soft tissues are normal. Sinuses/Orbits: No fluid levels or advanced mucosal thickening. No mastoid effusion. Normal orbits. IMPRESSION: No intracranial metastatic disease. Electronically Signed   By: KUlyses JarredM.D.   On: 02/05/2017 14:44   Mr Abdomen Wwo Contrast  Result Date: 01/15/2017 CLINICAL DATA:  Evaluate liver lesions noted on ultrasound. EXAM: MRI ABDOMEN WITHOUT AND WITH CONTRAST TECHNIQUE: Multiplanar multisequence MR imaging of the abdomen was performed both before and after the  administration of intravenous contrast. CONTRAST:  176mMULTIHANCE GADOBENATE DIMEGLUMINE 529 MG/ML IV SOLN COMPARISON:  None. FINDINGS: Lower chest: No acute findings. Hepatobiliary: The liver is enlarged containing multiple ill defined heterogeneous and peripheral enhancing lesions worrisome for multifocal metastatic disease. The largest lesion is in the medial segment of left lobe measuring 6 cm, image number 26 of series 6. Lesion within segment 8 measures 3.2 cm, image number 8 of series 6. Segment 5 lesion measures 3.1 cm, image 31 of series 6. The gallbladder fundus extends into the large mass within the medial segment of left lobe, image number 65 of series 1401. No biliary dilatation. Pancreas:  No inflammation or mass. Spleen:  Within normal limits in size and appearance. Adrenals/Urinary Tract: No masses identified. No evidence of hydronephrosis. Stomach/Bowel: The stomach is normal. The small bowel loops have a normal course and caliber. Visualized portions of the colon are unremarkable. Vascular/Lymphatic: Normal appearance of the abdominal aorta. Aortic atherosclerosis noted. Periaortic node is prominent measuring 8 mm, image number 50 of series 1401. Necrotic appearing portacaval node measures 1.5 cm, image 55 of series 1405. Porta hepatis node measures 1.3 cm, image 49 of series 1404 and has mass effect upon the extrahepatic portal vein. Other:  None. Musculoskeletal: No suspicious bone lesions identified. IMPRESSION: 1. Examination is positive for multifocal heterogeneous enhancing liver lesions worrisome for diffuse hepatic metastasis. Correlation with tissue sampling is advised. The dominant mass appears contiguous with the gallbladder fundus. Cannot rule out gallbladder carcinoma or other GI primary. 2. Enlarged portacaval and porta hepatic lymph nodes worrisome for metastatic adenopathy. Electronically Signed   By: TaKerby Moors.D.   On: 01/15/2017 16:09   Ir Angiogram Visceral  Selective  Result Date: 01/19/2017 INDICATION: Acute abdominal pain with acute perihepatic hemorrhage 2 hours status post percutaneous left hepatic metastasis biopsy. EXAM: SELECTIVE VISCERAL ARTERIOGRAPHY; IR EMBO ART VEN HEMORR LYMPH EXTRAV INC GUIDE ROADMAPPING; ADDITIONAL ARTERIOGRAPHY; IR ULTRASOUND GUIDANCE VASC ACCESS RIGHT MEDICATIONS: 1% lidocaine locally. ANESTHESIA/SEDATION: Moderate (conscious) sedation was employed during this procedure. A total of Versed 1.0 mg and Fentanyl 25 mcg was administered intravenously. Moderate Sedation Time: 50 minutes. The patient's level  of consciousness and vital signs were monitored continuously by radiology nursing throughout the procedure under my direct supervision. CONTRAST:  1 ISOVUE-300 IOPAMIDOL (ISOVUE-300) INJECTION 61%, 1 ISOVUE-300 IOPAMIDOL (ISOVUE-300) INJECTION 61% FLUOROSCOPY TIME:  Fluoroscopy Time: 14 minutes 12 seconds (144 mGy). COMPLICATIONS: None immediate. PROCEDURE: Informed consent was obtained from the patient following explanation of the procedure, risks, benefits and alternatives. The patient understands, agrees and consents for the procedure. All questions were addressed. A time out was performed prior to the initiation of the procedure. Maximal barrier sterile technique utilized including caps, mask, sterile gowns, sterile gloves, large sterile drape, hand hygiene, and Betadine prep. Under sterile conditions and local anesthesia, ultrasound micropuncture access performed of the right common femoral artery. Five French sheath inserted. SOS catheter was utilized to select the celiac origin. Celiac angiogram performed. Celiac: Celiac origin is widely patent. Splenic, common hepatic, gastroduodenal artery, proper hepatic, right and left hepatic arteries are all patent. Irregularity of the hepatic vasculature is secondary to hepatomegaly and diffuse hepatic metastases. No active bleeding demonstrated. On the venous phase, the splenic vein and  portal vein are all patent. Through the [place] a 5 French catheter, a Renegade STC microcatheter was advanced over a micro glidewire into the proper hepatic artery. Proper hepatic angiogram: Proper hepatic, right and left hepatic arteries are patent. Vasospasm present in the proximal right hepatic artery related to wire manipulation. Limited assessment of the vasculature because of the contrast injection and poor opacification of the left hepatic artery. Over a double angled Glidewire, the microcatheter was advanced into the left hepatic artery. Left hepatic angiogram performed. Left hepatic: Left hepatic artery is patent. This is the dominant supply to the left hepatic lobe lateral segment were the percutaneous biopsy was performed. Peripheral irregularity of the arterial vasculature related to hepatic metastases. No active extravasation, acute bleeding, or other acute vascular abnormality including AV fistula or pseudoaneurysm. Because of the significant recent acute perihepatic hemorrhage and a biopsy performed in the left hepatic lobe lateral segment, Gel-Foam embolization of left hepatic artery will be performed. Left hepatic artery Gel-Foam embolization: Through the microcatheter, approximately 5 cc of Gel-Foam slurry was instilled into left hepatic artery. Following embolization, there is significant reduction in the left hepatic arterial peripheral vasculature and sluggish flow but the left hepatic main artery remains patent. Catheter was retracted and advanced into a left hepatic branch supplying the medial segment. Additional left hepatic angiogram performed. Additional left hepatic angiogram: Irregularity of the parenchymal staining related to diffuse hepatic metastases. Scattered peripheral occlusions present in the additional left hepatic branch related to the Gel-Foam embolization. Vasospasm noted at the catheter site. No active bleeding demonstrated. Microcatheter was removed. A final angiogram was  performed through the SOS 5 French catheter of the celiac origin. Celiac: Following left hepatic Gel-Foam embolization, there is significant reduction in the arterial vascularity to the the left hepatic lobe where the biopsy was performed. Residual vasospasm present in the proximal left hepatic artery. No other significant acute vascular process. Catheter was retracted and advanced into the SMA. SMA angiogram performed. SMA angiogram: SMA is widely patent. No variant SMA supply demonstrated to the liver. On the venous phase, the superior mesenteric vein and portal vein all remain patent. Access removed. Hemostasis obtained with manual compression of the right common femoral artery puncture site. Patient tolerated the procedure well. No immediate complication. IMPRESSION: Successful celiac, hepatic, and SMA angiograms as above. Successful micro catheterization, angiograms, and Gel-Foam embolization of the left hepatic artery resulting in significant reduction  in arterial flow to the area the liver were the percutaneous biopsy was performed. Electronically Signed   By: Jerilynn Mages.  Shick M.D.   On: 01/19/2017 10:01   Ir Angiogram Visceral Selective  Result Date: 01/19/2017 INDICATION: Acute abdominal pain with acute perihepatic hemorrhage 2 hours status post percutaneous left hepatic metastasis biopsy. EXAM: SELECTIVE VISCERAL ARTERIOGRAPHY; IR EMBO ART VEN HEMORR LYMPH EXTRAV INC GUIDE ROADMAPPING; ADDITIONAL ARTERIOGRAPHY; IR ULTRASOUND GUIDANCE VASC ACCESS RIGHT MEDICATIONS: 1% lidocaine locally. ANESTHESIA/SEDATION: Moderate (conscious) sedation was employed during this procedure. A total of Versed 1.0 mg and Fentanyl 25 mcg was administered intravenously. Moderate Sedation Time: 50 minutes. The patient's level of consciousness and vital signs were monitored continuously by radiology nursing throughout the procedure under my direct supervision. CONTRAST:  1 ISOVUE-300 IOPAMIDOL (ISOVUE-300) INJECTION 61%, 1 ISOVUE-300  IOPAMIDOL (ISOVUE-300) INJECTION 61% FLUOROSCOPY TIME:  Fluoroscopy Time: 14 minutes 12 seconds (144 mGy). COMPLICATIONS: None immediate. PROCEDURE: Informed consent was obtained from the patient following explanation of the procedure, risks, benefits and alternatives. The patient understands, agrees and consents for the procedure. All questions were addressed. A time out was performed prior to the initiation of the procedure. Maximal barrier sterile technique utilized including caps, mask, sterile gowns, sterile gloves, large sterile drape, hand hygiene, and Betadine prep. Under sterile conditions and local anesthesia, ultrasound micropuncture access performed of the right common femoral artery. Five French sheath inserted. SOS catheter was utilized to select the celiac origin. Celiac angiogram performed. Celiac: Celiac origin is widely patent. Splenic, common hepatic, gastroduodenal artery, proper hepatic, right and left hepatic arteries are all patent. Irregularity of the hepatic vasculature is secondary to hepatomegaly and diffuse hepatic metastases. No active bleeding demonstrated. On the venous phase, the splenic vein and portal vein are all patent. Through the [place] a 5 French catheter, a Renegade STC microcatheter was advanced over a micro glidewire into the proper hepatic artery. Proper hepatic angiogram: Proper hepatic, right and left hepatic arteries are patent. Vasospasm present in the proximal right hepatic artery related to wire manipulation. Limited assessment of the vasculature because of the contrast injection and poor opacification of the left hepatic artery. Over a double angled Glidewire, the microcatheter was advanced into the left hepatic artery. Left hepatic angiogram performed. Left hepatic: Left hepatic artery is patent. This is the dominant supply to the left hepatic lobe lateral segment were the percutaneous biopsy was performed. Peripheral irregularity of the arterial vasculature related to  hepatic metastases. No active extravasation, acute bleeding, or other acute vascular abnormality including AV fistula or pseudoaneurysm. Because of the significant recent acute perihepatic hemorrhage and a biopsy performed in the left hepatic lobe lateral segment, Gel-Foam embolization of left hepatic artery will be performed. Left hepatic artery Gel-Foam embolization: Through the microcatheter, approximately 5 cc of Gel-Foam slurry was instilled into left hepatic artery. Following embolization, there is significant reduction in the left hepatic arterial peripheral vasculature and sluggish flow but the left hepatic main artery remains patent. Catheter was retracted and advanced into a left hepatic branch supplying the medial segment. Additional left hepatic angiogram performed. Additional left hepatic angiogram: Irregularity of the parenchymal staining related to diffuse hepatic metastases. Scattered peripheral occlusions present in the additional left hepatic branch related to the Gel-Foam embolization. Vasospasm noted at the catheter site. No active bleeding demonstrated. Microcatheter was removed. A final angiogram was performed through the SOS 5 French catheter of the celiac origin. Celiac: Following left hepatic Gel-Foam embolization, there is significant reduction in the arterial vascularity to the  the left hepatic lobe where the biopsy was performed. Residual vasospasm present in the proximal left hepatic artery. No other significant acute vascular process. Catheter was retracted and advanced into the SMA. SMA angiogram performed. SMA angiogram: SMA is widely patent. No variant SMA supply demonstrated to the liver. On the venous phase, the superior mesenteric vein and portal vein all remain patent. Access removed. Hemostasis obtained with manual compression of the right common femoral artery puncture site. Patient tolerated the procedure well. No immediate complication. IMPRESSION: Successful celiac, hepatic,  and SMA angiograms as above. Successful micro catheterization, angiograms, and Gel-Foam embolization of the left hepatic artery resulting in significant reduction in arterial flow to the area the liver were the percutaneous biopsy was performed. Electronically Signed   By: Jerilynn Mages.  Shick M.D.   On: 01/19/2017 10:01   Ir Angiogram Selective Each Additional Vessel  Result Date: 01/19/2017 INDICATION: Acute abdominal pain with acute perihepatic hemorrhage 2 hours status post percutaneous left hepatic metastasis biopsy. EXAM: SELECTIVE VISCERAL ARTERIOGRAPHY; IR EMBO ART VEN HEMORR LYMPH EXTRAV INC GUIDE ROADMAPPING; ADDITIONAL ARTERIOGRAPHY; IR ULTRASOUND GUIDANCE VASC ACCESS RIGHT MEDICATIONS: 1% lidocaine locally. ANESTHESIA/SEDATION: Moderate (conscious) sedation was employed during this procedure. A total of Versed 1.0 mg and Fentanyl 25 mcg was administered intravenously. Moderate Sedation Time: 50 minutes. The patient's level of consciousness and vital signs were monitored continuously by radiology nursing throughout the procedure under my direct supervision. CONTRAST:  1 ISOVUE-300 IOPAMIDOL (ISOVUE-300) INJECTION 61%, 1 ISOVUE-300 IOPAMIDOL (ISOVUE-300) INJECTION 61% FLUOROSCOPY TIME:  Fluoroscopy Time: 14 minutes 12 seconds (144 mGy). COMPLICATIONS: None immediate. PROCEDURE: Informed consent was obtained from the patient following explanation of the procedure, risks, benefits and alternatives. The patient understands, agrees and consents for the procedure. All questions were addressed. A time out was performed prior to the initiation of the procedure. Maximal barrier sterile technique utilized including caps, mask, sterile gowns, sterile gloves, large sterile drape, hand hygiene, and Betadine prep. Under sterile conditions and local anesthesia, ultrasound micropuncture access performed of the right common femoral artery. Five French sheath inserted. SOS catheter was utilized to select the celiac origin. Celiac  angiogram performed. Celiac: Celiac origin is widely patent. Splenic, common hepatic, gastroduodenal artery, proper hepatic, right and left hepatic arteries are all patent. Irregularity of the hepatic vasculature is secondary to hepatomegaly and diffuse hepatic metastases. No active bleeding demonstrated. On the venous phase, the splenic vein and portal vein are all patent. Through the [place] a 5 French catheter, a Renegade STC microcatheter was advanced over a micro glidewire into the proper hepatic artery. Proper hepatic angiogram: Proper hepatic, right and left hepatic arteries are patent. Vasospasm present in the proximal right hepatic artery related to wire manipulation. Limited assessment of the vasculature because of the contrast injection and poor opacification of the left hepatic artery. Over a double angled Glidewire, the microcatheter was advanced into the left hepatic artery. Left hepatic angiogram performed. Left hepatic: Left hepatic artery is patent. This is the dominant supply to the left hepatic lobe lateral segment were the percutaneous biopsy was performed. Peripheral irregularity of the arterial vasculature related to hepatic metastases. No active extravasation, acute bleeding, or other acute vascular abnormality including AV fistula or pseudoaneurysm. Because of the significant recent acute perihepatic hemorrhage and a biopsy performed in the left hepatic lobe lateral segment, Gel-Foam embolization of left hepatic artery will be performed. Left hepatic artery Gel-Foam embolization: Through the microcatheter, approximately 5 cc of Gel-Foam slurry was instilled into left hepatic artery. Following embolization, there  is significant reduction in the left hepatic arterial peripheral vasculature and sluggish flow but the left hepatic main artery remains patent. Catheter was retracted and advanced into a left hepatic branch supplying the medial segment. Additional left hepatic angiogram performed.  Additional left hepatic angiogram: Irregularity of the parenchymal staining related to diffuse hepatic metastases. Scattered peripheral occlusions present in the additional left hepatic branch related to the Gel-Foam embolization. Vasospasm noted at the catheter site. No active bleeding demonstrated. Microcatheter was removed. A final angiogram was performed through the SOS 5 French catheter of the celiac origin. Celiac: Following left hepatic Gel-Foam embolization, there is significant reduction in the arterial vascularity to the the left hepatic lobe where the biopsy was performed. Residual vasospasm present in the proximal left hepatic artery. No other significant acute vascular process. Catheter was retracted and advanced into the SMA. SMA angiogram performed. SMA angiogram: SMA is widely patent. No variant SMA supply demonstrated to the liver. On the venous phase, the superior mesenteric vein and portal vein all remain patent. Access removed. Hemostasis obtained with manual compression of the right common femoral artery puncture site. Patient tolerated the procedure well. No immediate complication. IMPRESSION: Successful celiac, hepatic, and SMA angiograms as above. Successful micro catheterization, angiograms, and Gel-Foam embolization of the left hepatic artery resulting in significant reduction in arterial flow to the area the liver were the percutaneous biopsy was performed. Electronically Signed   By: Jerilynn Mages.  Shick M.D.   On: 01/19/2017 10:01   Ir Angiogram Selective Each Additional Vessel  Result Date: 01/19/2017 INDICATION: Acute abdominal pain with acute perihepatic hemorrhage 2 hours status post percutaneous left hepatic metastasis biopsy. EXAM: SELECTIVE VISCERAL ARTERIOGRAPHY; IR EMBO ART VEN HEMORR LYMPH EXTRAV INC GUIDE ROADMAPPING; ADDITIONAL ARTERIOGRAPHY; IR ULTRASOUND GUIDANCE VASC ACCESS RIGHT MEDICATIONS: 1% lidocaine locally. ANESTHESIA/SEDATION: Moderate (conscious) sedation was employed  during this procedure. A total of Versed 1.0 mg and Fentanyl 25 mcg was administered intravenously. Moderate Sedation Time: 50 minutes. The patient's level of consciousness and vital signs were monitored continuously by radiology nursing throughout the procedure under my direct supervision. CONTRAST:  1 ISOVUE-300 IOPAMIDOL (ISOVUE-300) INJECTION 61%, 1 ISOVUE-300 IOPAMIDOL (ISOVUE-300) INJECTION 61% FLUOROSCOPY TIME:  Fluoroscopy Time: 14 minutes 12 seconds (144 mGy). COMPLICATIONS: None immediate. PROCEDURE: Informed consent was obtained from the patient following explanation of the procedure, risks, benefits and alternatives. The patient understands, agrees and consents for the procedure. All questions were addressed. A time out was performed prior to the initiation of the procedure. Maximal barrier sterile technique utilized including caps, mask, sterile gowns, sterile gloves, large sterile drape, hand hygiene, and Betadine prep. Under sterile conditions and local anesthesia, ultrasound micropuncture access performed of the right common femoral artery. Five French sheath inserted. SOS catheter was utilized to select the celiac origin. Celiac angiogram performed. Celiac: Celiac origin is widely patent. Splenic, common hepatic, gastroduodenal artery, proper hepatic, right and left hepatic arteries are all patent. Irregularity of the hepatic vasculature is secondary to hepatomegaly and diffuse hepatic metastases. No active bleeding demonstrated. On the venous phase, the splenic vein and portal vein are all patent. Through the [place] a 5 French catheter, a Renegade STC microcatheter was advanced over a micro glidewire into the proper hepatic artery. Proper hepatic angiogram: Proper hepatic, right and left hepatic arteries are patent. Vasospasm present in the proximal right hepatic artery related to wire manipulation. Limited assessment of the vasculature because of the contrast injection and poor opacification of the  left hepatic artery. Over a double angled Glidewire,  the microcatheter was advanced into the left hepatic artery. Left hepatic angiogram performed. Left hepatic: Left hepatic artery is patent. This is the dominant supply to the left hepatic lobe lateral segment were the percutaneous biopsy was performed. Peripheral irregularity of the arterial vasculature related to hepatic metastases. No active extravasation, acute bleeding, or other acute vascular abnormality including AV fistula or pseudoaneurysm. Because of the significant recent acute perihepatic hemorrhage and a biopsy performed in the left hepatic lobe lateral segment, Gel-Foam embolization of left hepatic artery will be performed. Left hepatic artery Gel-Foam embolization: Through the microcatheter, approximately 5 cc of Gel-Foam slurry was instilled into left hepatic artery. Following embolization, there is significant reduction in the left hepatic arterial peripheral vasculature and sluggish flow but the left hepatic main artery remains patent. Catheter was retracted and advanced into a left hepatic branch supplying the medial segment. Additional left hepatic angiogram performed. Additional left hepatic angiogram: Irregularity of the parenchymal staining related to diffuse hepatic metastases. Scattered peripheral occlusions present in the additional left hepatic branch related to the Gel-Foam embolization. Vasospasm noted at the catheter site. No active bleeding demonstrated. Microcatheter was removed. A final angiogram was performed through the SOS 5 French catheter of the celiac origin. Celiac: Following left hepatic Gel-Foam embolization, there is significant reduction in the arterial vascularity to the the left hepatic lobe where the biopsy was performed. Residual vasospasm present in the proximal left hepatic artery. No other significant acute vascular process. Catheter was retracted and advanced into the SMA. SMA angiogram performed. SMA angiogram:  SMA is widely patent. No variant SMA supply demonstrated to the liver. On the venous phase, the superior mesenteric vein and portal vein all remain patent. Access removed. Hemostasis obtained with manual compression of the right common femoral artery puncture site. Patient tolerated the procedure well. No immediate complication. IMPRESSION: Successful celiac, hepatic, and SMA angiograms as above. Successful micro catheterization, angiograms, and Gel-Foam embolization of the left hepatic artery resulting in significant reduction in arterial flow to the area the liver were the percutaneous biopsy was performed. Electronically Signed   By: Jerilynn Mages.  Shick M.D.   On: 01/19/2017 10:01   Ir Angiogram Selective Each Additional Vessel  Result Date: 01/19/2017 INDICATION: Acute abdominal pain with acute perihepatic hemorrhage 2 hours status post percutaneous left hepatic metastasis biopsy. EXAM: SELECTIVE VISCERAL ARTERIOGRAPHY; IR EMBO ART VEN HEMORR LYMPH EXTRAV INC GUIDE ROADMAPPING; ADDITIONAL ARTERIOGRAPHY; IR ULTRASOUND GUIDANCE VASC ACCESS RIGHT MEDICATIONS: 1% lidocaine locally. ANESTHESIA/SEDATION: Moderate (conscious) sedation was employed during this procedure. A total of Versed 1.0 mg and Fentanyl 25 mcg was administered intravenously. Moderate Sedation Time: 50 minutes. The patient's level of consciousness and vital signs were monitored continuously by radiology nursing throughout the procedure under my direct supervision. CONTRAST:  1 ISOVUE-300 IOPAMIDOL (ISOVUE-300) INJECTION 61%, 1 ISOVUE-300 IOPAMIDOL (ISOVUE-300) INJECTION 61% FLUOROSCOPY TIME:  Fluoroscopy Time: 14 minutes 12 seconds (144 mGy). COMPLICATIONS: None immediate. PROCEDURE: Informed consent was obtained from the patient following explanation of the procedure, risks, benefits and alternatives. The patient understands, agrees and consents for the procedure. All questions were addressed. A time out was performed prior to the initiation of the  procedure. Maximal barrier sterile technique utilized including caps, mask, sterile gowns, sterile gloves, large sterile drape, hand hygiene, and Betadine prep. Under sterile conditions and local anesthesia, ultrasound micropuncture access performed of the right common femoral artery. Five French sheath inserted. SOS catheter was utilized to select the celiac origin. Celiac angiogram performed. Celiac: Celiac origin is widely patent. Splenic,  common hepatic, gastroduodenal artery, proper hepatic, right and left hepatic arteries are all patent. Irregularity of the hepatic vasculature is secondary to hepatomegaly and diffuse hepatic metastases. No active bleeding demonstrated. On the venous phase, the splenic vein and portal vein are all patent. Through the [place] a 5 French catheter, a Renegade STC microcatheter was advanced over a micro glidewire into the proper hepatic artery. Proper hepatic angiogram: Proper hepatic, right and left hepatic arteries are patent. Vasospasm present in the proximal right hepatic artery related to wire manipulation. Limited assessment of the vasculature because of the contrast injection and poor opacification of the left hepatic artery. Over a double angled Glidewire, the microcatheter was advanced into the left hepatic artery. Left hepatic angiogram performed. Left hepatic: Left hepatic artery is patent. This is the dominant supply to the left hepatic lobe lateral segment were the percutaneous biopsy was performed. Peripheral irregularity of the arterial vasculature related to hepatic metastases. No active extravasation, acute bleeding, or other acute vascular abnormality including AV fistula or pseudoaneurysm. Because of the significant recent acute perihepatic hemorrhage and a biopsy performed in the left hepatic lobe lateral segment, Gel-Foam embolization of left hepatic artery will be performed. Left hepatic artery Gel-Foam embolization: Through the microcatheter, approximately 5 cc  of Gel-Foam slurry was instilled into left hepatic artery. Following embolization, there is significant reduction in the left hepatic arterial peripheral vasculature and sluggish flow but the left hepatic main artery remains patent. Catheter was retracted and advanced into a left hepatic branch supplying the medial segment. Additional left hepatic angiogram performed. Additional left hepatic angiogram: Irregularity of the parenchymal staining related to diffuse hepatic metastases. Scattered peripheral occlusions present in the additional left hepatic branch related to the Gel-Foam embolization. Vasospasm noted at the catheter site. No active bleeding demonstrated. Microcatheter was removed. A final angiogram was performed through the SOS 5 French catheter of the celiac origin. Celiac: Following left hepatic Gel-Foam embolization, there is significant reduction in the arterial vascularity to the the left hepatic lobe where the biopsy was performed. Residual vasospasm present in the proximal left hepatic artery. No other significant acute vascular process. Catheter was retracted and advanced into the SMA. SMA angiogram performed. SMA angiogram: SMA is widely patent. No variant SMA supply demonstrated to the liver. On the venous phase, the superior mesenteric vein and portal vein all remain patent. Access removed. Hemostasis obtained with manual compression of the right common femoral artery puncture site. Patient tolerated the procedure well. No immediate complication. IMPRESSION: Successful celiac, hepatic, and SMA angiograms as above. Successful micro catheterization, angiograms, and Gel-Foam embolization of the left hepatic artery resulting in significant reduction in arterial flow to the area the liver were the percutaneous biopsy was performed. Electronically Signed   By: Jerilynn Mages.  Shick M.D.   On: 01/19/2017 10:01   Nm Pet Image Initial (pi) Skull Base To Thigh  Result Date: 02/04/2017 CLINICAL DATA:  Initial  treatment strategy for breast cancer with liver metastases. EXAM: NUCLEAR MEDICINE PET SKULL BASE TO THIGH TECHNIQUE: 12.6 mCi F-18 FDG was injected intravenously. Full-ring PET imaging was performed from the skull base to thigh after the radiotracer. CT data was obtained and used for attenuation correction and anatomic localization. FASTING BLOOD GLUCOSE:  Value: 96 mg/dl COMPARISON:  CT chest 01/22/2017, CT abdomen pelvis 01/20/2017 and MR abdomen 01/15/2017. FINDINGS: NECK No hypermetabolic lymph nodes in the neck. CT images show no acute findings. CHEST Low left internal jugular lymph node measures 9 mm (CT image 59) with an  SUV max of 2.5. No additional hypermetabolic mediastinal, hilar or axillary lymph nodes. No hypermetabolic pulmonary nodules. Atherosclerotic calcification of the arterial vasculature, including coronary arteries. No pericardial effusion. Small bilateral pleural effusions. ABDOMEN/PELVIS Hypermetabolic lesions in the liver measure up to approximately 7.0 cm in the inferior right hepatic lobe, with an SUV max of 9.4. No abnormal hypermetabolism in the adrenal glands, spleen or pancreas. Hypermetabolic gastrohepatic ligament lymph nodes measure up to approximately 10 mm with an SUV max of 3.2. Hypermetabolic porta hepatis adenopathy is difficult to measure without IV contrast but has an SUV max 6.9. Hypermetabolic left periaortic lymph node measures 8 mm (CT image 160) with an SUV max of 5.6. Stones are seen in the gallbladder. Adrenal glands, kidneys, spleen, pancreas, stomach and bowel are grossly unremarkable. Moderate ascites. SKELETON No abnormal osseous hypermetabolism. IMPRESSION: 1. Hypermetabolic hepatic metastatic disease with hypermetabolic gastrohepatic ligament, porta hepatis and retroperitoneal lymph nodes. 2. Moderate ascites. 3. Small bilateral pleural effusions. 4. Aortic atherosclerosis (ICD10-170.0). Coronary artery calcification. 5. Cholelithiasis. Electronically Signed    By: Lorin Picket M.D.   On: 02/04/2017 10:20   US Biopsy  Result Date: 01/18/2017 INDICATION: LIVER METASTASES, UNKNOWN PRIMARY. EXAM: ULTRASOUND BIOPSY CORE LIVER MEDICATIONS: 1% LIDOCAINE ANESTHESIA/SEDATION: Moderate (conscious) sedation was employed during this procedure. A total of Versed 1.0 mg and Fentanyl 50 mcg was administered intravenously. Moderate Sedation Time: 10 minutes. The patient's level of consciousness and vital signs were monitored continuously by radiology nursing throughout the procedure under my direct supervision. FLUOROSCOPY TIME:  Fluoroscopy Time: NONE. COMPLICATIONS: None immediate. PROCEDURE: Informed written consent was obtained from the patient after a thorough discussion of the procedural risks, benefits and alternatives. All questions were addressed. Maximal Sterile Barrier Technique was utilized including caps, mask, sterile gowns, sterile gloves, sterile drape, hand hygiene and skin antiseptic. A timeout was performed prior to the initiation of the procedure. Previous imaging reviewed. Preliminary ultrasound performed. A solid left hepatic lobe lesion was localized from a subxiphoid window. This was correlated with the abdominal MRI. Overlying skin was marked. Under sterile conditions and local anesthesia, a 17 gauge 6.8 cm access needle was advanced from an anterior subxiphoid approach into the left hepatic mass. Needle position confirmed with ultrasound. Three 1 cm 18 gauge core biopsies obtained. Samples placed in formalin. These were intact and non fragmented. Needle tract embolized with Gel-Foam. Postprocedure imaging demonstrates no hemorrhage or hematoma. Patient tolerated the biopsy well. IMPRESSION: Successful ultrasound left hepatic mass 18 gauge core biopsy Electronically Signed   By: Jerilynn Mages.  Shick M.D.   On: 01/18/2017 14:53   Ir US Guide Vasc Access Right  Result Date: 01/19/2017 INDICATION: Acute abdominal pain with acute perihepatic hemorrhage 2 hours status  post percutaneous left hepatic metastasis biopsy. EXAM: SELECTIVE VISCERAL ARTERIOGRAPHY; IR EMBO ART VEN HEMORR LYMPH EXTRAV INC GUIDE ROADMAPPING; ADDITIONAL ARTERIOGRAPHY; IR ULTRASOUND GUIDANCE VASC ACCESS RIGHT MEDICATIONS: 1% lidocaine locally. ANESTHESIA/SEDATION: Moderate (conscious) sedation was employed during this procedure. A total of Versed 1.0 mg and Fentanyl 25 mcg was administered intravenously. Moderate Sedation Time: 50 minutes. The patient's level of consciousness and vital signs were monitored continuously by radiology nursing throughout the procedure under my direct supervision. CONTRAST:  1 ISOVUE-300 IOPAMIDOL (ISOVUE-300) INJECTION 61%, 1 ISOVUE-300 IOPAMIDOL (ISOVUE-300) INJECTION 61% FLUOROSCOPY TIME:  Fluoroscopy Time: 14 minutes 12 seconds (144 mGy). COMPLICATIONS: None immediate. PROCEDURE: Informed consent was obtained from the patient following explanation of the procedure, risks, benefits and alternatives. The patient understands, agrees and consents for the procedure. All  questions were addressed. A time out was performed prior to the initiation of the procedure. Maximal barrier sterile technique utilized including caps, mask, sterile gowns, sterile gloves, large sterile drape, hand hygiene, and Betadine prep. Under sterile conditions and local anesthesia, ultrasound micropuncture access performed of the right common femoral artery. Five French sheath inserted. SOS catheter was utilized to select the celiac origin. Celiac angiogram performed. Celiac: Celiac origin is widely patent. Splenic, common hepatic, gastroduodenal artery, proper hepatic, right and left hepatic arteries are all patent. Irregularity of the hepatic vasculature is secondary to hepatomegaly and diffuse hepatic metastases. No active bleeding demonstrated. On the venous phase, the splenic vein and portal vein are all patent. Through the [place] a 5 French catheter, a Renegade STC microcatheter was advanced over a micro  glidewire into the proper hepatic artery. Proper hepatic angiogram: Proper hepatic, right and left hepatic arteries are patent. Vasospasm present in the proximal right hepatic artery related to wire manipulation. Limited assessment of the vasculature because of the contrast injection and poor opacification of the left hepatic artery. Over a double angled Glidewire, the microcatheter was advanced into the left hepatic artery. Left hepatic angiogram performed. Left hepatic: Left hepatic artery is patent. This is the dominant supply to the left hepatic lobe lateral segment were the percutaneous biopsy was performed. Peripheral irregularity of the arterial vasculature related to hepatic metastases. No active extravasation, acute bleeding, or other acute vascular abnormality including AV fistula or pseudoaneurysm. Because of the significant recent acute perihepatic hemorrhage and a biopsy performed in the left hepatic lobe lateral segment, Gel-Foam embolization of left hepatic artery will be performed. Left hepatic artery Gel-Foam embolization: Through the microcatheter, approximately 5 cc of Gel-Foam slurry was instilled into left hepatic artery. Following embolization, there is significant reduction in the left hepatic arterial peripheral vasculature and sluggish flow but the left hepatic main artery remains patent. Catheter was retracted and advanced into a left hepatic branch supplying the medial segment. Additional left hepatic angiogram performed. Additional left hepatic angiogram: Irregularity of the parenchymal staining related to diffuse hepatic metastases. Scattered peripheral occlusions present in the additional left hepatic branch related to the Gel-Foam embolization. Vasospasm noted at the catheter site. No active bleeding demonstrated. Microcatheter was removed. A final angiogram was performed through the SOS 5 French catheter of the celiac origin. Celiac: Following left hepatic Gel-Foam embolization, there  is significant reduction in the arterial vascularity to the the left hepatic lobe where the biopsy was performed. Residual vasospasm present in the proximal left hepatic artery. No other significant acute vascular process. Catheter was retracted and advanced into the SMA. SMA angiogram performed. SMA angiogram: SMA is widely patent. No variant SMA supply demonstrated to the liver. On the venous phase, the superior mesenteric vein and portal vein all remain patent. Access removed. Hemostasis obtained with manual compression of the right common femoral artery puncture site. Patient tolerated the procedure well. No immediate complication. IMPRESSION: Successful celiac, hepatic, and SMA angiograms as above. Successful micro catheterization, angiograms, and Gel-Foam embolization of the left hepatic artery resulting in significant reduction in arterial flow to the area the liver were the percutaneous biopsy was performed. Electronically Signed   By: Jerilynn Mages.  Shick M.D.   On: 01/19/2017 10:01   Dg Chest Port 1v Same Day  Result Date: 01/18/2017 CLINICAL DATA:  Acute onset of shortness of breath, vomiting and epigastric abdominal pain. Fever. Initial encounter. EXAM: PORTABLE CHEST 1 VIEW COMPARISON:  Chest radiograph performed 01/16/2017 FINDINGS: The lungs are hyperexpanded,  with flattening of the hemidiaphragms, compatible with COPD. There is no evidence of focal opacification, pleural effusion or pneumothorax. The cardiomediastinal silhouette is within normal limits. No acute osseous abnormalities are seen. IMPRESSION: Findings of COPD.  Lungs remain otherwise clear. Electronically Signed   By: Garald Balding M.D.   On: 01/18/2017 23:48   Dg Abd Portable 1v  Result Date: 01/18/2017 CLINICAL DATA:  Acute onset of shortness of breath, vomiting and epigastric abdominal pain. Recent liver biopsy. Fever. Initial encounter. EXAM: PORTABLE ABDOMEN - 1 VIEW COMPARISON:  None. FINDINGS: The visualized bowel gas pattern is  unremarkable. Scattered air and stool filled loops of colon are seen; no abnormal dilatation of small bowel loops is seen to suggest small bowel obstruction. No free intra-abdominal air is identified, though evaluation for free air is limited on a single supine view. The stomach is largely filled with air. The visualized osseous structures are within normal limits; the sacroiliac joints are unremarkable in appearance. IMPRESSION: Unremarkable bowel gas pattern; no free intra-abdominal air seen. Moderate amount of stool noted in the colon. Electronically Signed   By: Garald Balding M.D.   On: 01/18/2017 23:50   US Breast Ltd Uni Left Inc Axilla  Result Date: 02/01/2017 CLINICAL DATA:  Status post liver biopsy demonstrating metastatic breast cancer. This is patient's baseline mammogram. EXAM: 2D DIGITAL DIAGNOSTIC BILATERAL MAMMOGRAM WITH CAD AND ADJUNCT TOMO ULTRASOUND LEFT BREAST COMPARISON:  None. ACR Breast Density Category c: The breast tissue is heterogeneously dense, which may obscure small masses. FINDINGS: Bilateral 2D CC and MLO projections were obtained, with additional 3D tomosynthesis. There are no dominant masses, suspicious calcifications or secondary signs of malignancy within either breast. There is an asymmetry within the lower left breast, compatible with superimposition of normal dense fibroglandular tissues on 3D tomosynthesis images, for which ultrasound will be performed to ensure benignity. Mammographic images were processed with CAD. Targeted ultrasound is performed, evaluating the lower outer quadrant of the left breast, showing only normal fibroglandular tissues and fat lobules. There is a ridge of normal dense fibroglandular tissue within the lower outer quadrant of the left breast corresponding to the mammographic finding. IMPRESSION: No evidence of malignancy within either breast. RECOMMENDATION: Given patient's history of a left breast biopsy demonstrating metastatic breast cancer, I  recommend breast MRI to evaluate for occult breast cancer. These results and recommendations were called by telephone at the time of interpretation on 02/01/2017 at 12:25 pm to Dr. Astrid Divine Tavon Corriher , who verbally acknowledged these results. I have discussed the findings and recommendations with the patient. Results were also provided in writing at the conclusion of the visit. If applicable, a reminder letter will be sent to the patient regarding the next appointment. BI-RADS CATEGORY  1: Negative. Electronically Signed   By: Franki Cabot M.D.   On: 02/01/2017 13:29   Mm Diag Breast Tomo Bilateral  Result Date: 02/01/2017 CLINICAL DATA:  Status post liver biopsy demonstrating metastatic breast cancer. This is patient's baseline mammogram. EXAM: 2D DIGITAL DIAGNOSTIC BILATERAL MAMMOGRAM WITH CAD AND ADJUNCT TOMO ULTRASOUND LEFT BREAST COMPARISON:  None. ACR Breast Density Category c: The breast tissue is heterogeneously dense, which may obscure small masses. FINDINGS: Bilateral 2D CC and MLO projections were obtained, with additional 3D tomosynthesis. There are no dominant masses, suspicious calcifications or secondary signs of malignancy within either breast. There is an asymmetry within the lower left breast, compatible with superimposition of normal dense fibroglandular tissues on 3D tomosynthesis images, for which ultrasound will be performed  to ensure benignity. Mammographic images were processed with CAD. Targeted ultrasound is performed, evaluating the lower outer quadrant of the left breast, showing only normal fibroglandular tissues and fat lobules. There is a ridge of normal dense fibroglandular tissue within the lower outer quadrant of the left breast corresponding to the mammographic finding. IMPRESSION: No evidence of malignancy within either breast. RECOMMENDATION: Given patient's history of a left breast biopsy demonstrating metastatic breast cancer, I recommend breast MRI to evaluate for occult breast  cancer. These results and recommendations were called by telephone at the time of interpretation on 02/01/2017 at 12:25 pm to Dr. Astrid Divine Daphine Loch , who verbally acknowledged these results. I have discussed the findings and recommendations with the patient. Results were also provided in writing at the conclusion of the visit. If applicable, a reminder letter will be sent to the patient regarding the next appointment. BI-RADS CATEGORY  1: Negative. Electronically Signed   By: Franki Cabot M.D.   On: 02/01/2017 13:29   Wheeler Guide Roadmapping  Result Date: 01/19/2017 INDICATION: Acute abdominal pain with acute perihepatic hemorrhage 2 hours status post percutaneous left hepatic metastasis biopsy. EXAM: SELECTIVE VISCERAL ARTERIOGRAPHY; IR EMBO ART VEN HEMORR LYMPH EXTRAV INC GUIDE ROADMAPPING; ADDITIONAL ARTERIOGRAPHY; IR ULTRASOUND GUIDANCE VASC ACCESS RIGHT MEDICATIONS: 1% lidocaine locally. ANESTHESIA/SEDATION: Moderate (conscious) sedation was employed during this procedure. A total of Versed 1.0 mg and Fentanyl 25 mcg was administered intravenously. Moderate Sedation Time: 50 minutes. The patient's level of consciousness and vital signs were monitored continuously by radiology nursing throughout the procedure under my direct supervision. CONTRAST:  1 ISOVUE-300 IOPAMIDOL (ISOVUE-300) INJECTION 61%, 1 ISOVUE-300 IOPAMIDOL (ISOVUE-300) INJECTION 61% FLUOROSCOPY TIME:  Fluoroscopy Time: 14 minutes 12 seconds (144 mGy). COMPLICATIONS: None immediate. PROCEDURE: Informed consent was obtained from the patient following explanation of the procedure, risks, benefits and alternatives. The patient understands, agrees and consents for the procedure. All questions were addressed. A time out was performed prior to the initiation of the procedure. Maximal barrier sterile technique utilized including caps, mask, sterile gowns, sterile gloves, large sterile drape, hand hygiene, and Betadine  prep. Under sterile conditions and local anesthesia, ultrasound micropuncture access performed of the right common femoral artery. Five French sheath inserted. SOS catheter was utilized to select the celiac origin. Celiac angiogram performed. Celiac: Celiac origin is widely patent. Splenic, common hepatic, gastroduodenal artery, proper hepatic, right and left hepatic arteries are all patent. Irregularity of the hepatic vasculature is secondary to hepatomegaly and diffuse hepatic metastases. No active bleeding demonstrated. On the venous phase, the splenic vein and portal vein are all patent. Through the [place] a 5 French catheter, a Renegade STC microcatheter was advanced over a micro glidewire into the proper hepatic artery. Proper hepatic angiogram: Proper hepatic, right and left hepatic arteries are patent. Vasospasm present in the proximal right hepatic artery related to wire manipulation. Limited assessment of the vasculature because of the contrast injection and poor opacification of the left hepatic artery. Over a double angled Glidewire, the microcatheter was advanced into the left hepatic artery. Left hepatic angiogram performed. Left hepatic: Left hepatic artery is patent. This is the dominant supply to the left hepatic lobe lateral segment were the percutaneous biopsy was performed. Peripheral irregularity of the arterial vasculature related to hepatic metastases. No active extravasation, acute bleeding, or other acute vascular abnormality including AV fistula or pseudoaneurysm. Because of the significant recent acute perihepatic hemorrhage and a biopsy performed in the left hepatic  lobe lateral segment, Gel-Foam embolization of left hepatic artery will be performed. Left hepatic artery Gel-Foam embolization: Through the microcatheter, approximately 5 cc of Gel-Foam slurry was instilled into left hepatic artery. Following embolization, there is significant reduction in the left hepatic arterial peripheral  vasculature and sluggish flow but the left hepatic main artery remains patent. Catheter was retracted and advanced into a left hepatic branch supplying the medial segment. Additional left hepatic angiogram performed. Additional left hepatic angiogram: Irregularity of the parenchymal staining related to diffuse hepatic metastases. Scattered peripheral occlusions present in the additional left hepatic branch related to the Gel-Foam embolization. Vasospasm noted at the catheter site. No active bleeding demonstrated. Microcatheter was removed. A final angiogram was performed through the SOS 5 French catheter of the celiac origin. Celiac: Following left hepatic Gel-Foam embolization, there is significant reduction in the arterial vascularity to the the left hepatic lobe where the biopsy was performed. Residual vasospasm present in the proximal left hepatic artery. No other significant acute vascular process. Catheter was retracted and advanced into the SMA. SMA angiogram performed. SMA angiogram: SMA is widely patent. No variant SMA supply demonstrated to the liver. On the venous phase, the superior mesenteric vein and portal vein all remain patent. Access removed. Hemostasis obtained with manual compression of the right common femoral artery puncture site. Patient tolerated the procedure well. No immediate complication. IMPRESSION: Successful celiac, hepatic, and SMA angiograms as above. Successful micro catheterization, angiograms, and Gel-Foam embolization of the left hepatic artery resulting in significant reduction in arterial flow to the area the liver were the percutaneous biopsy was performed. Electronically Signed   By: Jerilynn Mages.  Shick M.D.   On: 01/19/2017 10:01   Ir Fluoro Guide Port Insertion Right  Result Date: 02/06/2017 CLINICAL DATA:  pt with new dx of metastatic breast cancer with mets to liver EXAM: TUNNEL POWER PORT PLACEMENT WITH SUBCUTANEOUS POCKET UTILIZING ULTRASOUND & FLOUROSCOPY FLUOROSCOPY TIME:   18 seconds.  Two mGy. MEDICATIONS AND MEDICAL HISTORY: Versed 2 mg, Fentanyl 75 mcg. Additional Medications: None. Antibiotics were given within 2 hours of the procedure. ANESTHESIA/SEDATION: Moderate sedation time: 32 minutes. Nursing monitored the the patient during the procedure. PROCEDURE: After written informed consent was obtained, patient was placed in the supine position on angiographic table. The right neck and chest was prepped and draped in a sterile fashion. Lidocaine was utilized for local anesthesia. The right jugular vein was noted to be patent initially with ultrasound. Under sonographic guidance, a micropuncture needle was inserted into the right IJ vein (Ultrasound and fluoroscopic image documentation was performed). The needle was removed over an 018 wire which was exchanged for a Amplatz. This was advanced into the IVC. An 8-French dilator was advanced over the Amplatz. A small incision was made in the right upper chest over the anterior right second rib. Utilizing blunt dissection, a subcutaneous pocket was created in the caudal direction. The pocket was irrigated with a copious amount of sterile normal saline. The port catheter was tunneled from the chest incision, and out the neck incision. The reservoir was inserted into the subcutaneous pocket and secured with two 3-0 Ethilon stitches. A peel-away sheath was advanced over the Amplatz wire. The port catheter was cut to measure length and inserted through the peel-away sheath. The peel-away sheath was removed. The chest incision was closed with 3-0 Vicryl interrupted stitches for the subcutaneous tissue and a running of 4-0 Vicryl subcuticular stitch for the skin. The neck incision was closed with a 4-0 Vicryl subcuticular  stitch. Derma-bond was applied to both surgical incisions. The port reservoir was flushed and instilled with heparinized saline. No complications. FINDINGS: A right IJ vein Port-A-Cath is in place with its tip at the  cavoatrial junction. COMPLICATIONS: None IMPRESSION: Successful 8 French right internal jugular vein power port placement with its tip at the SVC/RA junction. Electronically Signed   By: Marybelle Killings M.D.   On: 02/06/2017 13:20   Ct Angio Abd/pel W/ And/or W/o  Result Date: 01/20/2017 CLINICAL DATA:  Hepatic metastases, status post ultrasound percutaneous biopsy which was complicated by acute bleeding 2 hours after the biopsy. Patient subsequently underwent Gel-Foam embolization of left hepatic artery. Despite transfusions, further decrease in hemoglobin this morning. EXAM: CT ANGIOGRAPHY ABDOMEN AND PELVIS TECHNIQUE: Multidetector CT imaging of the abdomen and pelvis was performed using the standard protocol during bolus administration of intravenous contrast. Multiplanar reconstructed images including MIPs were obtained and reviewed to evaluate the vascular anatomy. CONTRAST:  100 cc Isovue COMPARISON:  01/18/2017 FINDINGS: Arterial findings: Aorta: Scattered moderate atherosclerosis of the aorta, most pronounced in the infrarenal aorta without occlusion, aneurysm or dissection. No retroperitoneal hemorrhage. Celiac axis: Atherosclerotic origin but remains patent. Splenic, left gastric, and hepatic branches appear patent. No active bleeding or acute vascular process. Superior mesenteric: Atherosclerotic origin but remains patent. SMA branches appear patent. No active bleeding. Left renal:           Mild atherosclerosis but widely patent. Right renal:          Mild atherosclerosis but widely patent. Inferior mesenteric:  Atherosclerotic origin but remains patent. Left iliac: Iliac atherosclerosis noted without occlusion. Left common, internal and external iliac arteries are patent. Right iliac: Similar atherosclerosis without occlusion. Right common, internal and external iliac arteries are patent. Venous findings: Hepatic veins, portal, splenic, mesenteric veins are patent. No veno-occlusive process. IVC,  renal veins common iliac veins are patent. Review of the MIP images confirms the above findings. Nonvascular findings: Basilar emphysema noted. Minor atelectasis. Normal heart size. Trace pleural effusions. No pericardial effusion. Hypodense peripherally enhancing hepatic masses again evident compatible with metastases. No evidence of active bleeding or acute vascular process within the liver. No biliary dilatation. Gallbladder is collapsed but contains partially calcified gallstones. Moderate Heterogeneous perihepatic free fluid, compatible with a mixture of ascites and known hemoperitoneum related to the recent left hepatic mass biopsy. Spleen is unremarkable. Small amount of hemoperitoneum about the spleen upper pole. Adrenal glands, kidneys, ureters, and bladder demonstrate no acute process, obstruction or hydronephrosis. Negative for bowel obstruction, significant dilatation, ileus, or free air. No adenopathy Remote hysterectomy.  Pelvic ascites also evident. No inguinal abnormality or hernia. Intact abdominal wall. No rectus sheath hemorrhage or hematoma over the left hepatic biopsy site. Degenerative changes of the spine. Bones are mildly osteopenic. No acute osseous finding. IMPRESSION: Negative for acute or active intra-abdominal bleeding. Stable diffuse hepatic metastatic process. Stable perihepatic ascites and hemoperitoneum related to the recent left hepatic mass biopsy. Intact abdominal wall without rectus sheath hematoma or hemorrhage. Aortoiliac atherosclerosis Cholelithiasis Pelvic ascites as well These results were called by telephone at the time of interpretation on 01/20/2017 at 2:13 pm to Dr. Florencia Reasons , who verbally acknowledged these results. Electronically Signed   By: Jerilynn Mages.  Shick M.D.   On: 01/20/2017 14:15   US Abdomen Limited Ruq  Result Date: 01/18/2017 CLINICAL DATA:  61 year old female with a history of multiple liver masses, status post percutaneous biopsy today's date. Interval  development of abdominal pain EXAM: US ABDOMEN  LIMITED - RIGHT UPPER QUADRANT COMPARISON:  Ultrasound 01/18/2017, MRI 01/15/2017 FINDINGS: Gallbladder: Contracted gallbladder with echogenic focus in the lumen compatible with cholelithiasis. Common bile duct: Diameter: 4 mm -5 mm Liver: Re- demonstration of mass within right liver lobe 7 point far cm by 5.4 cm x 7.4 cm. The biopsy of the left liver lobe on the comparison ultrasound today's date demonstrates small echogenic focus, compatible with hemostatic material. There is hyperechoic free fluid adjacent to the liver margin, both anterior and posterior to the left liver lobe. IMPRESSION: New hyperechoic free fluid adjacent to the left liver lobe, status post percutaneous biopsy, compatible with hemorrhage. Re- demonstration of liver masses, better characterized on prior MRI. These results were called by telephone at the time of interpretation on 01/18/2017 at 5:42 pm to Dr. Annamaria Boots who verbally acknowledged these results. Electronically Signed   By: Corrie Mckusick D.O.   On: 01/18/2017 17:43   US Abdomen Limited Ruq  Result Date: 01/11/2017 CLINICAL DATA:  Right upper quadrant pain . EXAM: US ABDOMEN LIMITED - RIGHT UPPER QUADRANT COMPARISON:  No recent prior . FINDINGS: Gallbladder: Multiple gallstones are noted. Gallstone stones measure up to 1.1 cm. Gallbladder wall is thickened at 1.1 cm. Cholecystitis cannot be excluded. Negative Murphy sign. Common bile duct: Diameter: 2.0 mm Liver: Multiple solid hepatic lesions are noted. Largest lesion measures 9.7 cm and is in the right hepatic lobe. These findings are highly suspicious for metastatic disease. A pancreatic head mass cannot be excluded. Appear pancreatic adenopathy cannot be excluded. Portal vein is narrowed most likely from periportal adenopathy. Portal vein however is patent. IMPRESSION: 1. Multiple gallstones. Gallbladder wall is thickened at 1.1 cm. Cholecystitis cannot be excluded. 2. Multiple solid  hepatic lesions. The largest measures 9.7 cm. Pancreatic mass appears to be present. Pancreatic solid lesions noted most likely adenopathy. Portal vein is narrowed possibly from periportal adenopathy. Portal vein however is patent. These findings together suggest malignancy with metastatic disease. Further evaluation with gadolinium-enhanced MRI of the abdomen should be considered. Electronically Signed   By: Marcello Moores  Register   On: 01/11/2017 12:19     Assessment and plan- Patient is a 61 y.o. female with multiple liver metastases in visceral crisis likely breast primary although no breast mass found on imaging  1. I discussed the results of the PET CT scan as well as mammogram with the patient in detail. Patient does have significant liver metastases and second opinion from pathology also confirms that immunohistochemistry is consistent with breast primary although no breast masses been found on imaging so far. Tissue of origin testing is pending. However patient has been feeling poorly and is quite symptomatic from liver metastases. Hence at this time I will start palliative chemotherapy with every week carboplatin AUC 2 along with Taxol 60 mg/meter square Iv (reduced from 80 mg/meetrsquare due to anemia and poor PS) on a weekly basis 3 weeks on and 1 week off. I again discussed the risks and benefits of chemotherapy including all but not limited to fatigue, nausea, vomiting, low blood counts, risk of infection and hair loss. Side effects of peripheral neuropathy associated with Taxol as well as allergic reactions to Taxol. Patient understands and agrees to proceed. Patient understands that chemotherapy at this time is palliative and not curative to improve her quality of life and longevity  2. Chronic hyponatremia and hypochloremia- this has not improved despite normal saline infusion. Serum osmolality is low and urine osmo is normal with low urine na suggestive of hypovolemic  hyponatremia. Give 1L NS  again today. And reassess next week. Potassium has normalized  3. Anemia- ferritin high likely from malignancy. Serum iron is low. B12 elevated. Folate and TSH normal. Will give doses of feraheme this week and next week  4. Neoplasm related pain- controlled with medications. I have asked her to take it Q4 hours instead of Q6 hours  RTC in 1 week with labs and MD assessment prior to cycle # 2 of chemotherapy   Visit Diagnosis 1. Metastatic breast cancer ([place])   2. Iron deficiency anemia, unspecified iron deficiency anemia type   3. Liver metastases ([place])   4. Goals of care, counseling/discussion   5. Encounter for antineoplastic chemotherapy   6. Neoplasm related pain      Dr. Randa Evens, MD, MPH [place] [facility] Pager- [phone] 02/08/2017 11:37 AM    ADDENDUM: Just after her Taxol infusion was started patient felt warm and her saturations were checked a were down in the 22s. Patient did not feel short of breath and when I examined her she had mild scattered wheezing. She received a dose of Solu-Medrol as well as breathing treatment. Her saturations improved to 95% on 2L. It is unclear if this was from taxol as her saturations were not checked prior to taxol infusion. Patient completed her carbo/taxol infusion otherwise uneventfully and reported no sob or discomfort after infusion was completed. Patient is a long term smoker and may have baseline hypoxia. She is being sent home on 2L O2. This needs to be assessed during next visit. She knows to call us if she were to develop any signs and symptoms over the weekend.   Dr. Randa Evens, MD, MPH [facility] at [facility] [phone] 02/08/2017 4:36 PM

## 2017-02-09 ENCOUNTER — Other Ambulatory Visit: Payer: Self-pay | Admitting: Internal Medicine

## 2017-02-09 DIAGNOSIS — C50919 Malignant neoplasm of unspecified site of unspecified female breast: Secondary | ICD-10-CM

## 2017-02-09 DIAGNOSIS — C787 Secondary malignant neoplasm of liver and intrahepatic bile duct: Secondary | ICD-10-CM

## 2017-02-09 DIAGNOSIS — R609 Edema, unspecified: Secondary | ICD-10-CM

## 2017-02-09 NOTE — Telephone Encounter (Signed)
Patient called stating her pain prescription for an out on Monday [Dr. Janese Banks increase of frequency of pain medication]; asked to call the office on Monday to get a new prescription.

## 2017-02-11 ENCOUNTER — Ambulatory Visit
Admission: RE | Admit: 2017-02-11 | Discharge: 2017-02-11 | Disposition: A | Discharge status: Home / Self Care | Payer: Medicare Other | Source: Ambulatory Visit | Attending: Oncology | Admitting: Oncology

## 2017-02-11 DIAGNOSIS — R600 Localized edema: Secondary | ICD-10-CM | POA: Insufficient documentation

## 2017-02-11 DIAGNOSIS — R609 Edema, unspecified: Secondary | ICD-10-CM

## 2017-02-11 DIAGNOSIS — C50919 Malignant neoplasm of unspecified site of unspecified female breast: Secondary | ICD-10-CM | POA: Diagnosis not present

## 2017-02-11 DIAGNOSIS — C787 Secondary malignant neoplasm of liver and intrahepatic bile duct: Secondary | ICD-10-CM | POA: Insufficient documentation

## 2017-02-11 MED ORDER — OXYCODONE HCL 10 MG PO TABS: 10.0000 mg | ORAL_TABLET | Freq: Four times a day (QID) | ORAL | 0 refills | Status: DC | PRN

## 2017-02-11 NOTE — Addendum Note (Signed)
Addended by: Betti Cruz on: 02/11/2017 10:37 AM   Modules accepted: Orders

## 2017-02-11 NOTE — Addendum Note (Signed)
Addended by: Betti Cruz on: 02/11/2017 10:52 AM   Modules accepted: Orders

## 2017-02-11 NOTE — Addendum Note (Signed)
Addended by: Betti Cruz on: 02/11/2017 09:28 AM   Modules accepted: Orders

## 2017-02-11 NOTE — Addendum Note (Signed)
Addended by: Betti Cruz on: 02/11/2017 04:48 PM   Modules accepted: Orders

## 2017-02-11 NOTE — Telephone Encounter (Signed)
Per Rachel Butler, have patient come in early tomorrow for Lab only CBC. CMP, BNP, PT/APPT, we may need to do something else depending on her results. Patient agrees to come in at 10:30 for lab.

## 2017-02-11 NOTE — Telephone Encounter (Signed)
Called to discuss that she has swelling in both her legs which started yesterday; they are double normal size.  Her hands are not swollen, she sleeps feet elevated at night. She needs a refill on her Oxycodone. Please advise

## 2017-02-11 NOTE — Telephone Encounter (Signed)
Patient informed of Rx ready and that she will get a call from scheduler for an [place] appt

## 2017-02-12 ENCOUNTER — Inpatient Hospital Stay: Payer: Medicare Other

## 2017-02-12 ENCOUNTER — Encounter (HOSPITAL_COMMUNITY): Payer: Self-pay

## 2017-02-12 ENCOUNTER — Other Ambulatory Visit: Payer: Self-pay | Admitting: *Deleted

## 2017-02-12 ENCOUNTER — Telehealth: Payer: Self-pay | Admitting: *Deleted

## 2017-02-12 DIAGNOSIS — D509 Iron deficiency anemia, unspecified: Secondary | ICD-10-CM | POA: Diagnosis not present

## 2017-02-12 DIAGNOSIS — R609 Edema, unspecified: Secondary | ICD-10-CM

## 2017-02-12 DIAGNOSIS — K869 Disease of pancreas, unspecified: Secondary | ICD-10-CM | POA: Diagnosis not present

## 2017-02-12 DIAGNOSIS — C50919 Malignant neoplasm of unspecified site of unspecified female breast: Secondary | ICD-10-CM

## 2017-02-12 DIAGNOSIS — R188 Other ascites: Secondary | ICD-10-CM | POA: Diagnosis not present

## 2017-02-12 DIAGNOSIS — C787 Secondary malignant neoplasm of liver and intrahepatic bile duct: Secondary | ICD-10-CM | POA: Diagnosis not present

## 2017-02-12 DIAGNOSIS — Z5111 Encounter for antineoplastic chemotherapy: Secondary | ICD-10-CM | POA: Diagnosis not present

## 2017-02-12 LAB — PROTIME-INR
INR: 1.17
Prothrombin Time: 15 seconds (ref 11.4–15.2)

## 2017-02-12 LAB — CBC WITH DIFFERENTIAL/PLATELET
BASOS ABS: 0 10*3/uL (ref 0–0.1)
BASOS PCT: 0 %
EOS ABS: 0 10*3/uL (ref 0–0.7)
Eosinophils Relative: 0 %
HCT: 27.5 % — ABNORMAL LOW (ref 35.0–47.0)
Hemoglobin: 8.6 g/dL — ABNORMAL LOW (ref 12.0–16.0)
Lymphocytes Relative: 6 %
Lymphs Abs: 1.5 10*3/uL (ref 1.0–3.6)
MCH: 25.9 pg — ABNORMAL LOW (ref 26.0–34.0)
MCHC: 31.2 g/dL — ABNORMAL LOW (ref 32.0–36.0)
MCV: 83 fL (ref 80.0–100.0)
Monocytes Absolute: 0.3 10*3/uL (ref 0.2–0.9)
Monocytes Relative: 1 %
NEUTROS PCT: 93 %
Neutro Abs: 21.7 10*3/uL — ABNORMAL HIGH (ref 1.4–6.5)
PLATELETS: 316 10*3/uL (ref 150–440)
RBC: 3.32 MIL/uL — ABNORMAL LOW (ref 3.80–5.20)
RDW: 21.2 % — ABNORMAL HIGH (ref 11.5–14.5)
WBC: 23.4 10*3/uL — AB (ref 3.6–11.0)

## 2017-02-12 LAB — COMPREHENSIVE METABOLIC PANEL
ALT: 27 U/L (ref 14–54)
AST: 37 U/L (ref 15–41)
Albumin: 2 g/dL — ABNORMAL LOW (ref 3.5–5.0)
Alkaline Phosphatase: 159 U/L — ABNORMAL HIGH (ref 38–126)
Anion gap: 6 (ref 5–15)
BUN: 10 mg/dL (ref 6–20)
CHLORIDE: 91 mmol/L — AB (ref 101–111)
CO2: 32 mmol/L (ref 22–32)
CREATININE: 0.45 mg/dL (ref 0.44–1.00)
Calcium: 8.4 mg/dL — ABNORMAL LOW (ref 8.9–10.3)
GFR calc Af Amer: >60 mL/min (ref >60)
Glucose, Bld: 114 mg/dL — ABNORMAL HIGH (ref 65–99)
Potassium: 4 mmol/L (ref 3.5–5.1)
Sodium: 129 mmol/L — ABNORMAL LOW (ref 135–145)
Total Bilirubin: 0.8 mg/dL (ref 0.3–1.2)
Total Protein: 6 g/dL — ABNORMAL LOW (ref 6.5–8.1)

## 2017-02-12 LAB — BRAIN NATRIURETIC PEPTIDE: B NATRIURETIC PEPTIDE 5: 1418 pg/mL — AB (ref 0.0–100.0)

## 2017-02-12 LAB — APTT: aPTT: 30 seconds (ref 24–36)

## 2017-02-12 NOTE — Telephone Encounter (Signed)
After pt called about swelling of her legs, Dr. Theodoro Doing had told her to come in and get labs today to help to determine what was causing the fluid in her legs.  After he got back the labs her BNP was elevated and suggested that we get cardiac involved.  The pt said she did not have cardiologist but she did have ECHO in [place].  I called [facility] and spoke to Dr. Rockey Situ. I explained that pt came to [place] after bing in hosp. And had abnormal ct scan showing liver mass. Had the mass bx and thought to be breast primary metastatic cancer. She got fluids 3/6 due to her na level being low and k level low.  Then she was given k pills oral to take daily.  She started her first chemo 3/16 and got IVF again on top of her chemo of carbo and taxol. Fluids for low na.  I told Dr. Rockey Situ that pt had TEE as well as ECHO. theEF was good on both tests. When I told him the BNP was elevated and he saw her labs and reviewed the ECHO and TEE. He states that she has moderate pulmonary HTN on ECHO and she also had mitral valve regurgitation moderate.  In the past she had CHF and saw DR. Caryl Comes a few years ago. He suggested that pt cut down her fluid intake, watch na intake, no free salt on food at table. Take 20 mg lasix bid and with each pill give 10 meq potassium for 3 days and then daily lasix and potassium for 3 days and montitor her electrolytes and swelling.  Pt may need the tablets longer but to monitor her.  She would benefit from getting in the CHF clinic with [place]. I spoke to Dr Grayland Ormond about the above and he agreed with all of it and  I called in the rx to pt pharmacy for 10 days and I called pt and let her know about the above. She had lasix at home 40 mg and  A few is all she has and she has no more potassium. I told her that I called some in and she is ok to take the lasix 40 but needs to take 2 potassium pills that she will need to get at pharmacy. She is agreeable and she states she will watch her water  intake and she does not add salt to anything and she has been doing this for years. We will recheck her levels when she comes in this Friday. She is agreeable to the plan

## 2017-02-14 DIAGNOSIS — G43909 Migraine, unspecified, not intractable, without status migrainosus: Secondary | ICD-10-CM | POA: Diagnosis not present

## 2017-02-14 DIAGNOSIS — F17210 Nicotine dependence, cigarettes, uncomplicated: Secondary | ICD-10-CM | POA: Diagnosis not present

## 2017-02-14 DIAGNOSIS — M5090 Cervical disc disorder, unspecified, unspecified cervical region: Secondary | ICD-10-CM | POA: Diagnosis not present

## 2017-02-14 DIAGNOSIS — M5136 Other intervertebral disc degeneration, lumbar region: Secondary | ICD-10-CM | POA: Diagnosis not present

## 2017-02-14 DIAGNOSIS — D376 Neoplasm of uncertain behavior of liver, gallbladder and bile ducts: Secondary | ICD-10-CM | POA: Diagnosis not present

## 2017-02-14 DIAGNOSIS — G894 Chronic pain syndrome: Secondary | ICD-10-CM | POA: Diagnosis not present

## 2017-02-15 ENCOUNTER — Inpatient Hospital Stay: Payer: Medicare Other

## 2017-02-15 ENCOUNTER — Encounter: Payer: Self-pay | Admitting: Hematology and Oncology

## 2017-02-15 ENCOUNTER — Inpatient Hospital Stay (HOSPITAL_BASED_OUTPATIENT_CLINIC_OR_DEPARTMENT_OTHER): Payer: Medicare Other | Admitting: Hematology and Oncology

## 2017-02-15 VITALS — BP 152/83 | HR 94 | Temp 98.3°F | Resp 18 | Wt 135.1 lb

## 2017-02-15 VITALS — BP 117/66 | HR 85

## 2017-02-15 DIAGNOSIS — C787 Secondary malignant neoplasm of liver and intrahepatic bile duct: Secondary | ICD-10-CM | POA: Diagnosis not present

## 2017-02-15 DIAGNOSIS — R160 Hepatomegaly, not elsewhere classified: Secondary | ICD-10-CM

## 2017-02-15 DIAGNOSIS — L89152 Pressure ulcer of sacral region, stage 2: Secondary | ICD-10-CM

## 2017-02-15 DIAGNOSIS — R188 Other ascites: Secondary | ICD-10-CM | POA: Diagnosis not present

## 2017-02-15 DIAGNOSIS — R5381 Other malaise: Secondary | ICD-10-CM

## 2017-02-15 DIAGNOSIS — D509 Iron deficiency anemia, unspecified: Secondary | ICD-10-CM | POA: Diagnosis not present

## 2017-02-15 DIAGNOSIS — I700 Atherosclerosis of aorta: Secondary | ICD-10-CM

## 2017-02-15 DIAGNOSIS — I272 Pulmonary hypertension, unspecified: Secondary | ICD-10-CM

## 2017-02-15 DIAGNOSIS — R5383 Other fatigue: Secondary | ICD-10-CM | POA: Diagnosis not present

## 2017-02-15 DIAGNOSIS — C50919 Malignant neoplasm of unspecified site of unspecified female breast: Secondary | ICD-10-CM | POA: Diagnosis not present

## 2017-02-15 DIAGNOSIS — G629 Polyneuropathy, unspecified: Secondary | ICD-10-CM

## 2017-02-15 DIAGNOSIS — F17210 Nicotine dependence, cigarettes, uncomplicated: Secondary | ICD-10-CM

## 2017-02-15 DIAGNOSIS — Z79899 Other long term (current) drug therapy: Secondary | ICD-10-CM

## 2017-02-15 DIAGNOSIS — B370 Candidal stomatitis: Secondary | ICD-10-CM | POA: Diagnosis not present

## 2017-02-15 DIAGNOSIS — Z809 Family history of malignant neoplasm, unspecified: Secondary | ICD-10-CM

## 2017-02-15 DIAGNOSIS — Z880 Allergy status to penicillin: Secondary | ICD-10-CM

## 2017-02-15 DIAGNOSIS — K869 Disease of pancreas, unspecified: Secondary | ICD-10-CM | POA: Diagnosis not present

## 2017-02-15 DIAGNOSIS — I739 Peripheral vascular disease, unspecified: Secondary | ICD-10-CM

## 2017-02-15 DIAGNOSIS — G893 Neoplasm related pain (acute) (chronic): Secondary | ICD-10-CM

## 2017-02-15 DIAGNOSIS — R531 Weakness: Secondary | ICD-10-CM

## 2017-02-15 DIAGNOSIS — J449 Chronic obstructive pulmonary disease, unspecified: Secondary | ICD-10-CM

## 2017-02-15 DIAGNOSIS — E869 Volume depletion, unspecified: Secondary | ICD-10-CM

## 2017-02-15 DIAGNOSIS — M519 Unspecified thoracic, thoracolumbar and lumbosacral intervertebral disc disorder: Secondary | ICD-10-CM

## 2017-02-15 DIAGNOSIS — R1011 Right upper quadrant pain: Secondary | ICD-10-CM

## 2017-02-15 DIAGNOSIS — I2510 Atherosclerotic heart disease of native coronary artery without angina pectoris: Secondary | ICD-10-CM

## 2017-02-15 DIAGNOSIS — Z90722 Acquired absence of ovaries, bilateral: Secondary | ICD-10-CM

## 2017-02-15 DIAGNOSIS — R64 Cachexia: Secondary | ICD-10-CM

## 2017-02-15 DIAGNOSIS — Z5111 Encounter for antineoplastic chemotherapy: Secondary | ICD-10-CM

## 2017-02-15 DIAGNOSIS — R600 Localized edema: Secondary | ICD-10-CM

## 2017-02-15 DIAGNOSIS — L89159 Pressure ulcer of sacral region, unspecified stage: Secondary | ICD-10-CM

## 2017-02-15 DIAGNOSIS — Z90710 Acquired absence of both cervix and uterus: Secondary | ICD-10-CM

## 2017-02-15 DIAGNOSIS — I5032 Chronic diastolic (congestive) heart failure: Secondary | ICD-10-CM

## 2017-02-15 DIAGNOSIS — Z9981 Dependence on supplemental oxygen: Secondary | ICD-10-CM

## 2017-02-15 DIAGNOSIS — K8020 Calculus of gallbladder without cholecystitis without obstruction: Secondary | ICD-10-CM

## 2017-02-15 LAB — CBC WITH DIFFERENTIAL/PLATELET
BASOS ABS: 0.1 10*3/uL (ref 0–0.1)
BASOS PCT: 0 %
EOS ABS: 0 10*3/uL (ref 0–0.7)
Eosinophils Relative: 0 %
HEMATOCRIT: 31.5 % — AB (ref 35.0–47.0)
HEMOGLOBIN: 10.1 g/dL — AB (ref 12.0–16.0)
Lymphocytes Relative: 7 %
Lymphs Abs: 1.4 10*3/uL (ref 1.0–3.6)
MCH: 26.7 pg (ref 26.0–34.0)
MCHC: 32.3 g/dL (ref 32.0–36.0)
MCV: 82.7 fL (ref 80.0–100.0)
MONOS PCT: 5 %
Monocytes Absolute: 0.9 10*3/uL (ref 0.2–0.9)
NEUTROS ABS: 16.5 10*3/uL — AB (ref 1.4–6.5)
Neutrophils Relative %: 88 %
Platelets: 327 10*3/uL (ref 150–440)
RBC: 3.8 MIL/uL (ref 3.80–5.20)
RDW: 21.1 % — AB (ref 11.5–14.5)
WBC: 18.8 10*3/uL — AB (ref 3.6–11.0)

## 2017-02-15 LAB — COMPREHENSIVE METABOLIC PANEL
ALK PHOS: 309 U/L — AB (ref 38–126)
ALT: 28 U/L (ref 14–54)
ANION GAP: 12 (ref 5–15)
AST: 39 U/L (ref 15–41)
Albumin: 2.1 g/dL — ABNORMAL LOW (ref 3.5–5.0)
BUN: 8 mg/dL (ref 6–20)
CO2: 31 mmol/L (ref 22–32)
Calcium: 8.4 mg/dL — ABNORMAL LOW (ref 8.9–10.3)
Chloride: 88 mmol/L — ABNORMAL LOW (ref 101–111)
Creatinine, Ser: 0.64 mg/dL (ref 0.44–1.00)
GFR calc Af Amer: >60 mL/min (ref >60)
GFR calc non Af Amer: >60 mL/min (ref >60)
GLUCOSE: 85 mg/dL (ref 65–99)
POTASSIUM: 3.6 mmol/L (ref 3.5–5.1)
SODIUM: 131 mmol/L — AB (ref 135–145)
Total Bilirubin: 0.9 mg/dL (ref 0.3–1.2)
Total Protein: 6.6 g/dL (ref 6.5–8.1)

## 2017-02-15 LAB — MAGNESIUM: Magnesium: 1.1 mg/dL — ABNORMAL LOW (ref 1.7–2.4)

## 2017-02-15 MED ORDER — CLOTRIMAZOLE 10 MG MT LOZG: 10.0000 mg | LOZENGE | Freq: Every day | OROMUCOSAL | 0 refills | Status: AC

## 2017-02-15 MED ORDER — HEPARIN SOD (PORK) LOCK FLUSH 100 UNIT/ML IV SOLN
500.0000 [IU] | Freq: Once | INTRAVENOUS | Status: AC | PRN
  Administered 2017-02-15: 500 [IU]
  Filled 2017-02-15: qty 5

## 2017-02-15 MED ORDER — FAMOTIDINE IN NACL 20-0.9 MG/50ML-% IV SOLN
20.0000 mg | Freq: Once | INTRAVENOUS | Status: AC
  Administered 2017-02-15: 20 mg via INTRAVENOUS
  Filled 2017-02-15: qty 50

## 2017-02-15 MED ORDER — SODIUM CHLORIDE 0.9 % IV SOLN
60.0000 mg/m2 | Freq: Once | INTRAVENOUS | Status: AC
  Administered 2017-02-15: 90 mg via INTRAVENOUS
  Filled 2017-02-15: qty 15

## 2017-02-15 MED ORDER — SODIUM CHLORIDE 0.9 % IV SOLN
164.2000 mg | Freq: Once | INTRAVENOUS | Status: AC
  Administered 2017-02-15: 160 mg via INTRAVENOUS
  Filled 2017-02-15: qty 16

## 2017-02-15 MED ORDER — PALONOSETRON HCL INJECTION 0.25 MG/5ML
0.2500 mg | Freq: Once | INTRAVENOUS | Status: AC
  Administered 2017-02-15: 0.25 mg via INTRAVENOUS
  Filled 2017-02-15: qty 5

## 2017-02-15 MED ORDER — MAGNESIUM SULFATE 4 GM/100ML IV SOLN
4.0000 g | Freq: Once | INTRAVENOUS | Status: AC
  Administered 2017-02-15: 4 g via INTRAVENOUS
  Filled 2017-02-15: qty 100

## 2017-02-15 MED ORDER — SODIUM CHLORIDE 0.9 % IV SOLN
20.0000 mg | Freq: Once | INTRAVENOUS | Status: AC
  Administered 2017-02-15: 20 mg via INTRAVENOUS
  Filled 2017-02-15: qty 2

## 2017-02-15 MED ORDER — DIPHENHYDRAMINE HCL 50 MG/ML IJ SOLN
50.0000 mg | Freq: Once | INTRAMUSCULAR | Status: AC
  Administered 2017-02-15: 50 mg via INTRAVENOUS
  Filled 2017-02-15: qty 1

## 2017-02-15 MED ORDER — SODIUM CHLORIDE 0.9% FLUSH
10.0000 mL | INTRAVENOUS | Status: DC | PRN
  Administered 2017-02-15: 10 mL
  Filled 2017-02-15: qty 10

## 2017-02-15 MED ORDER — SODIUM CHLORIDE 0.9 % IV SOLN
Freq: Once | INTRAVENOUS | Status: AC
  Administered 2017-02-15: 12:00:00 via INTRAVENOUS
  Filled 2017-02-15: qty 1000

## 2017-02-15 NOTE — Progress Notes (Signed)
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  2. Ultrasound showed multiple gallstones. Multiple solid hepatic lesions area and the largest measures 9.7 cm. Pancreatic mass appears to be present. Pancreatic solid lesions noted most likely adenopathy. Portal vein is narrowed possibly from periportal adenopathy.  3. MRI of the abdomen on 01/15/2017 showed enlargement of the liver and contains multiple ill-defined heterogeneous and peripheral enhancing lesions worrisome for multifocal metastatic disease. The largest lesion is in the medial segment of the left lobe measuring 6 cm. Lesion within segment 8 measures 3.2 cm segment 5 lesion measures 3.1 cm. The gallbladder fundus extends into the large mass within the medial segment of the left lobe. No biliary dilation. No inflammation or mass in the pancreas. Enlarged portocaval and porta hepatic lymph nodes worrisome for metastatic adenopathy.  4. Patient underwent ultrasound-guided liver biopsy which showed metastatic adenocarcinoma. Malignant cells are positive for CK 7 and GATA 3 as well as focal positive for estrogen. ER 40% positive PR negative. Cells are negative for CK 20, CDX2, TTF1, PAX8, napsin A, p53, WT 1 This is favored to represent breast primary  5. Post liver biopsy patient had acute bleeding for about 2 hours and underwent Gelfoam embolization of the left hepatic artery. CTA of the  abdomen did not reveal any acute or active intra-abdominal bleeding. Stable diffuse hepatic metastatic process. Stable perihepatic ascites and hemoperitoneum related to recent left hepatic mass biopsy. Intact abdominal wall without rectus sheath hematoma or hemorrhage. Pelvic ascites. Pelvic ascites.  6. CTA of the chest showed no pulmonary embolus. Centrilobular emphysema. Secretions versus small pretracheal nodules. Short-term follow-up CT or bronchoscopy would be useful for further evaluation.   7. There was also a concern for endocarditis which was ruled out with TEE.   8. PET/CT from 02/04/17 showed: IMPRESSION: 1. Hypermetabolic hepatic metastatic disease with hypermetabolic gastrohepatic ligament, porta hepatis and retroperitoneal lymph nodes. 2. Moderate ascites. 3. Small bilateral pleural effusions. 4. Aortic atherosclerosis (ICD10-170.0). Coronary artery calcification. 5. Cholelithiasis.  9. Mammogram showed no breast mass. MRI breast pending. MRI brain was negative for metastases  10. Patients case was discussed at tumor board and pathology from [facility] reviewed it for second opinion as well. IHC consistent with breast primary although no primary breast mass. Tissue of origin test sent for confirmation  Interval history- Patient feels "fair". She is fatigued. Pain meds are helping with abdominal pain.  She is on 2L O2. She denies shortness of breath. She has thrush.  She has a wound on her "backside". She has swelling in her abdomen and legs.  She is unable to perform her activities of daily living (ADLs).  ECOG PS- 3 Pain scale- 0 Opioid associated constipation- no  Review of systems- Review of Systems  Constitutional: Positive for malaise/fatigue. Negative for chills, fever and weight loss.  HENT: Negative for congestion, ear discharge and nosebleeds.        Thrush on tongue, mucous membranes  Eyes: Negative for blurred vision.  Respiratory: Positive for  cough.  Negative for hemoptysis, sputum production, shortness of breath and wheezing.        Cough at night with O2  Cardiovascular: Positive for leg swelling. Negative for chest pain, palpitations, orthopnea and claudication.  Gastrointestinal: Positive for abdominal pain and nausea. Negative for blood in stool, constipation, diarrhea, heartburn, melena and vomiting.       Nausea controlled with prn compazine and zofran  Genitourinary: Negative for dysuria, flank pain, frequency, hematuria and urgency.  Musculoskeletal: Negative for back pain, joint pain and myalgias.  Skin: Negative for itching.       Sacral soreness  Neurological: Positive for tingling and weakness. Negative for dizziness, focal weakness, seizures and headaches.       Chronic tingling in LUE x52yr  Endo/Heme/Allergies: Does not bruise/bleed easily.  Psychiatric/Behavioral: Negative for depression and suicidal ideas. The patient does not have insomnia.      Current treatment- weekly carboplatin and Taxol  Allergies  Allergen Reactions  . Indomethacin Other (See Comments)    Can not urinate  . Penicillins Rash  . Robaxin [Methocarbamol] Other (See Comments)    Urinary retention  . Nubain [Nalbuphine Hcl]      Past Medical History:  Diagnosis Date  . Anemia   . Arthritis   . Blood transfusion without reported diagnosis   . [place]   . Cervical disc disease   . COPD (chronic obstructive pulmonary disease) ([place]   . Intervertebral disk disease   . Lumbar disc disease   . Migraines   . Nicotine dependence   . UTI (urinary tract infection)      Past Surgical History:  Procedure Laterality Date  . ABDOMINAL HYSTERECTOMY  1986   right tube and ovary removed--then laparoscopy after for ?retained cyst?  . CERVICAL DISCECTOMY  1993   C5-6  . CESAREAN SECTION  [id number] . COLONOSCOPY    . disk removal     c5c6  . IR GENERIC HISTORICAL  01/18/2017   IR EMBO ART  VEN HEMORR LYMPH EXTRAV  INC GUIDE ROADMAPPING  01/18/2017 MGreggory Keen MD [facility]  . IR GENERIC HISTORICAL  01/18/2017   IR ANGIOGRAM SELECTIVE EACH ADDITIONAL VESSEL 01/18/2017 MGreggory Keen MD [facility]  . IR GENERIC HISTORICAL  01/18/2017   IR ANGIOGRAM SELECTIVE EACH ADDITIONAL VESSEL 01/18/2017 MGreggory Keen MD [facility]  . IR GENERIC HISTORICAL  01/18/2017   IR [place] V[place] ACCESS RIGHT 01/18/2017 MGreggory Keen MD [facility]  . IR GENERIC HISTORICAL  01/18/2017   IR ANGIOGRAM VISCERAL SELECTIVE 01/18/2017 MGreggory Keen MD [facility]  . IR GENERIC HISTORICAL  01/18/2017   IR ANGIOGRAM SELECTIVE EACH ADDITIONAL VESSEL 01/18/2017 MGreggory Keen MD [facility]  . IR GENERIC HISTORICAL  01/18/2017   IR ANGIOGRAM VISCERAL SELECTIVE 01/18/2017 MGreggory Keen MD [facility]  . IR GENERIC HISTORICAL  02/06/2017   IR FLUORO GUIDE PORT INSERTION RIGHT 02/06/2017 AMarybelle Killings MD [facility]-INTERV RAD  . TEE WITHOUT CARDIOVERSION N/A 01/24/2017   Procedure: TRANSESOPHAGEAL ECHOCARDIOGRAM (TEE)  WITH ANESTHESIA;  Surgeon: TSueanne Margarita MD;  [place]  Service: Cardiovascular;  Laterality: N/A;  . [place] . TUBAL LIGATION      Social History   Social History  . Marital status: Married    Spouse name: N/A  . Number of children: 2  . Years of education: N/A   Occupational History  . ASocial research officer, government---retired   . 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 Smoking status: Current Every Day Smoker    Packs/day: 1.00    Years: 40.00    Types: Cigarettes  . Smokeless tobacco: Never Used  . Alcohol use No  . Drug use: No  . Sexual activity: Not on file   Other Topics Concern  . Not on file   Social History Narrative   [name] daughter      Has living will   Husband is health care POA   Would accept resuscitation but no prolonged ventilation   No tube feeds if cognitively unaware    Family History  Problem Relation Age of Onset  . Cancer Mother   .  COPD Father   . Heart disease Father   . Hypertension Father   . Thyroid disease Other   . Cancer Other     mother's side  . Heart disease Other     Dad's side  . Colon cancer Neg Hx      Current Outpatient Prescriptions:  .  BIOTIN 5000 PO, Take by mouth daily., Disp: , Rfl:  .  dexamethasone (DECADRON) 4 MG tablet, Take 2 tablets (8 mg total) by mouth daily. Start the day after chemotherapy for 2 days., Disp: 30 tablet, Rfl: 1 .  fentaNYL (DURAGESIC - DOSED MCG/HR) 25 MCG/HR patch, Place 25 mcg onto the skin every 3 (three) days. , Disp: , Rfl: 0 .  furosemide (LASIX) 20 MG tablet, Take 20 mg by mouth daily., Disp: , Rfl:  .  gabapentin (NEURONTIN) 800 MG tablet, Take 1 tablet (800 mg total) by mouth 3 (three) times daily., Disp: 90 tablet, Rfl: 11 .  lidocaine-prilocaine (EMLA) cream, Apply 1 application topically as needed. Apply small amount to port site at least 1 hour prior to it being accessed, cover with plastic wrap, Disp: 30 g, Rfl: 1 .  lidocaine-prilocaine (EMLA) cream, Apply to affected area once, Disp: 30 g, Rfl: 3 .  LORazepam (ATIVAN) 0.5 MG tablet, Take 1 tablet (0.5 mg total) by mouth every 6 (six) hours as needed (Nausea or vomiting)., Disp: 30 tablet, Rfl: 0 .  Melatonin 2.5 MG CAPS, Take 1 capsule by mouth at bedtime., Disp: , Rfl:  .  metoprolol tartrate (LOPRESSOR) 25 MG tablet, Take 0.5 tablets (12.5 mg total) by mouth 2 (two) times daily., Disp: 60 tablet, Rfl: 0 .  Multiple Vitamins-Minerals (MULTIVITAMIN WITH MINERALS) tablet, Take 1 tablet by mouth daily., Disp: , Rfl:  .  ondansetron (ZOFRAN) 8 MG tablet, Take 1 tablet (8 mg total) by mouth 2 (two) times daily as needed for refractory nausea / vomiting. Start on day 3 after chemo., Disp: 30 tablet, Rfl: 1 .  Oxycodone HCl 10 MG TABS, Take 1 tablet (10 mg total) by mouth every 6 (six) hours as needed., Disp: 56 tablet, Rfl: 0 .  polyethylene glycol (MIRALAX / GLYCOLAX) packet, Take 17 g by mouth daily., Disp: 14  each, Rfl: 0 .  potassium chloride SA (K-DUR,KLOR-CON) 20 MEQ tablet, Take 1 tablet (20 mEq total) by mouth 2 (two) times daily., Disp: 20 tablet, Rfl: 0 .  prochlorperazine (COMPAZINE) 10 MG tablet, Take 1 tablet (10 mg total) by mouth every 6 (six) hours as needed (Nausea or vomiting)., Disp: 30 tablet, Rfl: 1 .  promethazine (PHENERGAN) 25 MG tablet, TAKE 1/2 TO 1 TABLETS BY MOUTH 3 TIMES DAILY AS NEEDED FOR NAUSEA, Disp: 30 tablet, Rfl: 0 .  sennosides-docusate sodium (SENOKOT-S) 8.6-50 MG tablet, Take 2 tablets by  mouth daily., Disp: , Rfl:  .  tiZANidine (ZANAFLEX) 4 MG capsule, Take 4 mg by mouth 3 (three) times daily. Takes 1 tid and up to 3 tablets at bedtime to sleep, Disp: , Rfl:  .  clotrimazole (MYCELEX) 10 MG troche, Take 1 lozenge (10 mg total) by mouth 5 (five) times daily., Disp: 50 lozenge, Rfl: 0  Physical exam:  Vitals:   02/15/17 0954  BP: (!) 152/83  Pulse: 94  Resp: 18  Temp: 98.3 F (36.8 C)  TempSrc: Tympanic  Weight: 135 lb 2 oz (61.3 kg)   Physical Exam  Constitutional: She is oriented to person, place, and time.  Fatigued, cachectic, sitting in a wheelchair in no acute distress  HENT:  Head: Normocephalic and atraumatic.  Tongue coated (thrush)  Eyes: EOM are normal. Pupils are equal, round, and reactive to light.  Neck: Normal range of motion.  Cardiovascular: Normal rate, regular rhythm and normal heart sounds.   Pulmonary/Chest: Effort normal and breath sounds normal.  On 2L O2  Abdominal: Soft. Bowel sounds are normal.  Palpable hepatomegaly.  Ascites.  Musculoskeletal: She exhibits edema.  Neurological: She is alert and oriented to person, place, and time.  Skin: Skin is warm and dry. No erythema.  Sacral stage II decubitus ulcer.     CMP Latest Ref Rng & Units 02/15/2017  Glucose 65 - 99 mg/dL 85  BUN 6 - 20 mg/dL 8  Creatinine 0.44 - 1.00 mg/dL 0.64  Sodium 135 - 145 mmol/L 131(L)  Potassium 3.5 - 5.1 mmol/L 3.6  Chloride 101 - 111 mmol/L  88(L)  CO2 22 - 32 mmol/L 31  Calcium 8.9 - 10.3 mg/dL 8.4(L)  Total Protein 6.5 - 8.1 g/dL 6.6  Total Bilirubin 0.3 - 1.2 mg/dL 0.9  Alkaline Phos 38 - 126 U/L 309(H)  AST 15 - 41 U/L 39  ALT 14 - 54 U/L 28   CBC Latest Ref Rng & Units 02/15/2017  WBC 3.6 - 11.0 K/uL 18.8(H)  Hemoglobin 12.0 - 16.0 g/dL 10.1(L)  Hematocrit 35.0 - 47.0 % 31.5(L)  Platelets 150 - 440 K/uL 327    No images are attached to the encounter.  Ct Angio Chest Pe W Or Wo Contrast  Addendum Date: 01/23/2017   ADDENDUM REPORT: 01/23/2017 14:00 ADDENDUM: Voice recognition error: The third sentence of the Findings section should read "There is no focal filling defect to suggest pulmonary embolus." Electronically Signed   By: San Morelle M.D.   On: 01/23/2017 14:00   Result Date: 01/23/2017 CLINICAL DATA:  Hypoxia. EXAM: CT ANGIOGRAPHY CHEST WITH CONTRAST TECHNIQUE: Multidetector CT imaging of the chest was performed using the standard protocol during bolus administration of intravenous contrast. Multiplanar CT image reconstructions and MIPs were obtained to evaluate the vascular anatomy. CONTRAST:  80 mL Isovue 370 COMPARISON:  One-view chest x-ray 01/18/2017. CT of the abdomen and pelvis 01/20/2017 FINDINGS: Cardiovascular: The heart size is normal. The pulmonary arteries demonstrate excellent opacification. There is focal filling defect to suggest pulmonary embolus. Pulmonary arterial size is normal. The aorta demonstrates atherosclerotic change without aneurysm. A 4 vessel arch configuration is present. The left vertebral artery Mediastinum/Nodes: No significant mediastinal or axillary adenopathy is present. The esophagus is unremarkable. Secretions versus small tracheal nodules are evident. Lungs/Pleura: Centrilobular emphysema is present. No focal nodule or mass lesion is present. Mild dependent atelectasis is present bilaterally. There are small bilateral pleural effusions. Upper Abdomen: Liver metastases are  again noted. Musculoskeletal: No focal lytic or blastic lesions  are present. Vertebral body heights alignment are maintained. Review of the MIP images confirms the above findings. IMPRESSION: 1. No pulmonary embolus. 2. Centrilobular emphysema. 3. Secretions versus small tracheal nodules. Short-term follow-up CT scan of or bronchoscopy would be useful for further evaluation. 4. Aortic atherosclerosis. 5. Liver metastases. Electronically Signed: By: San Morelle M.D. On: 01/22/2017 09:53   Mr Jeri Cos IB Contrast  Result Date: 02/05/2017 CLINICAL DATA:  Breast cancer with liver metastases. EXAM: MRI HEAD WITHOUT AND WITH CONTRAST TECHNIQUE: Multiplanar, multiecho pulse sequences of the brain and surrounding structures were obtained without and with intravenous contrast. CONTRAST:  40m MULTIHANCE GADOBENATE DIMEGLUMINE 529 MG/ML IV SOLN COMPARISON:  PET CT 02/04/2017 FINDINGS: Brain: No focal diffusion restriction to indicate acute infarct. No intraparenchymal hemorrhage. There is minimal multifocal hyperintense T2-weighted signal within the periventricular white matter, which may be seen in the setting of migraine headaches or early chronic microvascular disease; however, it is also seen in normal patients of this age. No mass lesion or midline shift. No hydrocephalus or extra-axial fluid collection. The midline structures are normal. No age advanced or lobar predominant atrophy. Vascular: Major intracranial arterial and venous sinus flow voids are preserved. No evidence of chronic microhemorrhage or amyloid angiopathy. Skull and upper cervical spine: The visualized skull base, calvarium, upper cervical spine and extracranial soft tissues are normal. Sinuses/Orbits: No fluid levels or advanced mucosal thickening. No mastoid effusion. Normal orbits. IMPRESSION: No intracranial metastatic disease. Electronically Signed   By: KUlyses JarredM.D.   On: 02/05/2017 14:44   Ir Angiogram Visceral  Selective  Result Date: 01/19/2017 INDICATION: Acute abdominal pain with acute perihepatic hemorrhage 2 hours status post percutaneous left hepatic metastasis biopsy. EXAM: SELECTIVE VISCERAL ARTERIOGRAPHY; IR EMBO ART VEN HEMORR LYMPH EXTRAV INC GUIDE ROADMAPPING; ADDITIONAL ARTERIOGRAPHY; IR ULTRASOUND GUIDANCE VASC ACCESS RIGHT MEDICATIONS: 1% lidocaine locally. ANESTHESIA/SEDATION: Moderate (conscious) sedation was employed during this procedure. A total of Versed 1.0 mg and Fentanyl 25 mcg was administered intravenously. Moderate Sedation Time: 50 minutes. The patient's level of consciousness and vital signs were monitored continuously by radiology nursing throughout the procedure under my direct supervision. CONTRAST:  1 ISOVUE-300 IOPAMIDOL (ISOVUE-300) INJECTION 61%, 1 ISOVUE-300 IOPAMIDOL (ISOVUE-300) INJECTION 61% FLUOROSCOPY TIME:  Fluoroscopy Time: 14 minutes 12 seconds (144 mGy). COMPLICATIONS: None immediate. PROCEDURE: Informed consent was obtained from the patient following explanation of the procedure, risks, benefits and alternatives. The patient understands, agrees and consents for the procedure. All questions were addressed. A time out was performed prior to the initiation of the procedure. Maximal barrier sterile technique utilized including caps, mask, sterile gowns, sterile gloves, large sterile drape, hand hygiene, and Betadine prep. Under sterile conditions and local anesthesia, ultrasound micropuncture access performed of the right common femoral artery. Five French sheath inserted. SOS catheter was utilized to select the celiac origin. Celiac angiogram performed. Celiac: Celiac origin is widely patent. Splenic, common hepatic, gastroduodenal artery, proper hepatic, right and left hepatic arteries are all patent. Irregularity of the hepatic vasculature is secondary to hepatomegaly and diffuse hepatic metastases. No active bleeding demonstrated. On the venous phase, the splenic vein and  portal vein are all patent. Through the SAdaira 5 French catheter, a Renegade STC microcatheter was advanced over a micro glidewire into the proper hepatic artery. Proper hepatic angiogram: Proper hepatic, right and left hepatic arteries are patent. Vasospasm present in the proximal right hepatic artery related to wire manipulation. Limited assessment of the vasculature because of the contrast injection and poor opacification of  the left hepatic artery. Over a double angled Glidewire, the microcatheter was advanced into the left hepatic artery. Left hepatic angiogram performed. Left hepatic: Left hepatic artery is patent. This is the dominant supply to the left hepatic lobe lateral segment were the percutaneous biopsy was performed. Peripheral irregularity of the arterial vasculature related to hepatic metastases. No active extravasation, acute bleeding, or other acute vascular abnormality including AV fistula or pseudoaneurysm. Because of the significant recent acute perihepatic hemorrhage and a biopsy performed in the left hepatic lobe lateral segment, Gel-Foam embolization of left hepatic artery will be performed. Left hepatic artery Gel-Foam embolization: Through the microcatheter, approximately 5 cc of Gel-Foam slurry was instilled into left hepatic artery. Following embolization, there is significant reduction in the left hepatic arterial peripheral vasculature and sluggish flow but the left hepatic main artery remains patent. Catheter was retracted and advanced into a left hepatic branch supplying the medial segment. Additional left hepatic angiogram performed. Additional left hepatic angiogram: Irregularity of the parenchymal staining related to diffuse hepatic metastases. Scattered peripheral occlusions present in the additional left hepatic branch related to the Gel-Foam embolization. Vasospasm noted at the catheter site. No active bleeding demonstrated. Microcatheter was removed. A final angiogram was  performed through the SOS 5 French catheter of the celiac origin. Celiac: Following left hepatic Gel-Foam embolization, there is significant reduction in the arterial vascularity to the the left hepatic lobe where the biopsy was performed. Residual vasospasm present in the proximal left hepatic artery. No other significant acute vascular process. Catheter was retracted and advanced into the SMA. SMA angiogram performed. SMA angiogram: SMA is widely patent. No variant SMA supply demonstrated to the liver. On the venous phase, the superior mesenteric vein and portal vein all remain patent. Access removed. Hemostasis obtained with manual compression of the right common femoral artery puncture site. Patient tolerated the procedure well. No immediate complication. IMPRESSION: Successful celiac, hepatic, and SMA angiograms as above. Successful micro catheterization, angiograms, and Gel-Foam embolization of the left hepatic artery resulting in significant reduction in arterial flow to the area the liver were the percutaneous biopsy was performed. Electronically Signed   By: Jerilynn Mages.  Shick M.D.   On: 01/19/2017 10:01   Ir Angiogram Visceral Selective  Result Date: 01/19/2017 INDICATION: Acute abdominal pain with acute perihepatic hemorrhage 2 hours status post percutaneous left hepatic metastasis biopsy. EXAM: SELECTIVE VISCERAL ARTERIOGRAPHY; IR EMBO ART VEN HEMORR LYMPH EXTRAV INC GUIDE ROADMAPPING; ADDITIONAL ARTERIOGRAPHY; IR ULTRASOUND GUIDANCE VASC ACCESS RIGHT MEDICATIONS: 1% lidocaine locally. ANESTHESIA/SEDATION: Moderate (conscious) sedation was employed during this procedure. A total of Versed 1.0 mg and Fentanyl 25 mcg was administered intravenously. Moderate Sedation Time: 50 minutes. The patient's level of consciousness and vital signs were monitored continuously by radiology nursing throughout the procedure under my direct supervision. CONTRAST:  1 ISOVUE-300 IOPAMIDOL (ISOVUE-300) INJECTION 61%, 1 ISOVUE-300  IOPAMIDOL (ISOVUE-300) INJECTION 61% FLUOROSCOPY TIME:  Fluoroscopy Time: 14 minutes 12 seconds (144 mGy). COMPLICATIONS: None immediate. PROCEDURE: Informed consent was obtained from the patient following explanation of the procedure, risks, benefits and alternatives. The patient understands, agrees and consents for the procedure. All questions were addressed. A time out was performed prior to the initiation of the procedure. Maximal barrier sterile technique utilized including caps, mask, sterile gowns, sterile gloves, large sterile drape, hand hygiene, and Betadine prep. Under sterile conditions and local anesthesia, ultrasound micropuncture access performed of the right common femoral artery. Five French sheath inserted. SOS catheter was utilized to select the celiac origin. Celiac angiogram performed.  Celiac: Celiac origin is widely patent. Splenic, common hepatic, gastroduodenal artery, proper hepatic, right and left hepatic arteries are all patent. Irregularity of the hepatic vasculature is secondary to hepatomegaly and diffuse hepatic metastases. No active bleeding demonstrated. On the venous phase, the splenic vein and portal vein are all patent. Through the [place] a 5 French catheter, a Renegade STC microcatheter was advanced over a micro glidewire into the proper hepatic artery. Proper hepatic angiogram: Proper hepatic, right and left hepatic arteries are patent. Vasospasm present in the proximal right hepatic artery related to wire manipulation. Limited assessment of the vasculature because of the contrast injection and poor opacification of the left hepatic artery. Over a double angled Glidewire, the microcatheter was advanced into the left hepatic artery. Left hepatic angiogram performed. Left hepatic: Left hepatic artery is patent. This is the dominant supply to the left hepatic lobe lateral segment were the percutaneous biopsy was performed. Peripheral irregularity of the arterial vasculature related to  hepatic metastases. No active extravasation, acute bleeding, or other acute vascular abnormality including AV fistula or pseudoaneurysm. Because of the significant recent acute perihepatic hemorrhage and a biopsy performed in the left hepatic lobe lateral segment, Gel-Foam embolization of left hepatic artery will be performed. Left hepatic artery Gel-Foam embolization: Through the microcatheter, approximately 5 cc of Gel-Foam slurry was instilled into left hepatic artery. Following embolization, there is significant reduction in the left hepatic arterial peripheral vasculature and sluggish flow but the left hepatic main artery remains patent. Catheter was retracted and advanced into a left hepatic branch supplying the medial segment. Additional left hepatic angiogram performed. Additional left hepatic angiogram: Irregularity of the parenchymal staining related to diffuse hepatic metastases. Scattered peripheral occlusions present in the additional left hepatic branch related to the Gel-Foam embolization. Vasospasm noted at the catheter site. No active bleeding demonstrated. Microcatheter was removed. A final angiogram was performed through the SOS 5 French catheter of the celiac origin. Celiac: Following left hepatic Gel-Foam embolization, there is significant reduction in the arterial vascularity to the the left hepatic lobe where the biopsy was performed. Residual vasospasm present in the proximal left hepatic artery. No other significant acute vascular process. Catheter was retracted and advanced into the SMA. SMA angiogram performed. SMA angiogram: SMA is widely patent. No variant SMA supply demonstrated to the liver. On the venous phase, the superior mesenteric vein and portal vein all remain patent. Access removed. Hemostasis obtained with manual compression of the right common femoral artery puncture site. Patient tolerated the procedure well. No immediate complication. IMPRESSION: Successful celiac, hepatic,  and SMA angiograms as above. Successful micro catheterization, angiograms, and Gel-Foam embolization of the left hepatic artery resulting in significant reduction in arterial flow to the area the liver were the percutaneous biopsy was performed. Electronically Signed   By: Jerilynn Mages.  Shick M.D.   On: 01/19/2017 10:01   Ir Angiogram Selective Each Additional Vessel  Result Date: 01/19/2017 INDICATION: Acute abdominal pain with acute perihepatic hemorrhage 2 hours status post percutaneous left hepatic metastasis biopsy. EXAM: SELECTIVE VISCERAL ARTERIOGRAPHY; IR EMBO ART VEN HEMORR LYMPH EXTRAV INC GUIDE ROADMAPPING; ADDITIONAL ARTERIOGRAPHY; IR ULTRASOUND GUIDANCE VASC ACCESS RIGHT MEDICATIONS: 1% lidocaine locally. ANESTHESIA/SEDATION: Moderate (conscious) sedation was employed during this procedure. A total of Versed 1.0 mg and Fentanyl 25 mcg was administered intravenously. Moderate Sedation Time: 50 minutes. The patient's level of consciousness and vital signs were monitored continuously by radiology nursing throughout the procedure under my direct supervision. CONTRAST:  1 ISOVUE-300 IOPAMIDOL (ISOVUE-300) INJECTION 61%, 1  ISOVUE-300 IOPAMIDOL (ISOVUE-300) INJECTION 61% FLUOROSCOPY TIME:  Fluoroscopy Time: 14 minutes 12 seconds (144 mGy). COMPLICATIONS: None immediate. PROCEDURE: Informed consent was obtained from the patient following explanation of the procedure, risks, benefits and alternatives. The patient understands, agrees and consents for the procedure. All questions were addressed. A time out was performed prior to the initiation of the procedure. Maximal barrier sterile technique utilized including caps, mask, sterile gowns, sterile gloves, large sterile drape, hand hygiene, and Betadine prep. Under sterile conditions and local anesthesia, ultrasound micropuncture access performed of the right common femoral artery. Five French sheath inserted. SOS catheter was utilized to select the celiac origin. Celiac  angiogram performed. Celiac: Celiac origin is widely patent. Splenic, common hepatic, gastroduodenal artery, proper hepatic, right and left hepatic arteries are all patent. Irregularity of the hepatic vasculature is secondary to hepatomegaly and diffuse hepatic metastases. No active bleeding demonstrated. On the venous phase, the splenic vein and portal vein are all patent. Through the [place] a 5 French catheter, a Renegade STC microcatheter was advanced over a micro glidewire into the proper hepatic artery. Proper hepatic angiogram: Proper hepatic, right and left hepatic arteries are patent. Vasospasm present in the proximal right hepatic artery related to wire manipulation. Limited assessment of the vasculature because of the contrast injection and poor opacification of the left hepatic artery. Over a double angled Glidewire, the microcatheter was advanced into the left hepatic artery. Left hepatic angiogram performed. Left hepatic: Left hepatic artery is patent. This is the dominant supply to the left hepatic lobe lateral segment were the percutaneous biopsy was performed. Peripheral irregularity of the arterial vasculature related to hepatic metastases. No active extravasation, acute bleeding, or other acute vascular abnormality including AV fistula or pseudoaneurysm. Because of the significant recent acute perihepatic hemorrhage and a biopsy performed in the left hepatic lobe lateral segment, Gel-Foam embolization of left hepatic artery will be performed. Left hepatic artery Gel-Foam embolization: Through the microcatheter, approximately 5 cc of Gel-Foam slurry was instilled into left hepatic artery. Following embolization, there is significant reduction in the left hepatic arterial peripheral vasculature and sluggish flow but the left hepatic main artery remains patent. Catheter was retracted and advanced into a left hepatic branch supplying the medial segment. Additional left hepatic angiogram performed.  Additional left hepatic angiogram: Irregularity of the parenchymal staining related to diffuse hepatic metastases. Scattered peripheral occlusions present in the additional left hepatic branch related to the Gel-Foam embolization. Vasospasm noted at the catheter site. No active bleeding demonstrated. Microcatheter was removed. A final angiogram was performed through the SOS 5 French catheter of the celiac origin. Celiac: Following left hepatic Gel-Foam embolization, there is significant reduction in the arterial vascularity to the the left hepatic lobe where the biopsy was performed. Residual vasospasm present in the proximal left hepatic artery. No other significant acute vascular process. Catheter was retracted and advanced into the SMA. SMA angiogram performed. SMA angiogram: SMA is widely patent. No variant SMA supply demonstrated to the liver. On the venous phase, the superior mesenteric vein and portal vein all remain patent. Access removed. Hemostasis obtained with manual compression of the right common femoral artery puncture site. Patient tolerated the procedure well. No immediate complication. IMPRESSION: Successful celiac, hepatic, and SMA angiograms as above. Successful micro catheterization, angiograms, and Gel-Foam embolization of the left hepatic artery resulting in significant reduction in arterial flow to the area the liver were the percutaneous biopsy was performed. Electronically Signed   By: Jerilynn Mages.  Shick M.D.   On: 01/19/2017  10:01   Ir Angiogram Selective Each Additional Vessel  Result Date: 01/19/2017 INDICATION: Acute abdominal pain with acute perihepatic hemorrhage 2 hours status post percutaneous left hepatic metastasis biopsy. EXAM: SELECTIVE VISCERAL ARTERIOGRAPHY; IR EMBO ART VEN HEMORR LYMPH EXTRAV INC GUIDE ROADMAPPING; ADDITIONAL ARTERIOGRAPHY; IR ULTRASOUND GUIDANCE VASC ACCESS RIGHT MEDICATIONS: 1% lidocaine locally. ANESTHESIA/SEDATION: Moderate (conscious) sedation was employed  during this procedure. A total of Versed 1.0 mg and Fentanyl 25 mcg was administered intravenously. Moderate Sedation Time: 50 minutes. The patient's level of consciousness and vital signs were monitored continuously by radiology nursing throughout the procedure under my direct supervision. CONTRAST:  1 ISOVUE-300 IOPAMIDOL (ISOVUE-300) INJECTION 61%, 1 ISOVUE-300 IOPAMIDOL (ISOVUE-300) INJECTION 61% FLUOROSCOPY TIME:  Fluoroscopy Time: 14 minutes 12 seconds (144 mGy). COMPLICATIONS: None immediate. PROCEDURE: Informed consent was obtained from the patient following explanation of the procedure, risks, benefits and alternatives. The patient understands, agrees and consents for the procedure. All questions were addressed. A time out was performed prior to the initiation of the procedure. Maximal barrier sterile technique utilized including caps, mask, sterile gowns, sterile gloves, large sterile drape, hand hygiene, and Betadine prep. Under sterile conditions and local anesthesia, ultrasound micropuncture access performed of the right common femoral artery. Five French sheath inserted. SOS catheter was utilized to select the celiac origin. Celiac angiogram performed. Celiac: Celiac origin is widely patent. Splenic, common hepatic, gastroduodenal artery, proper hepatic, right and left hepatic arteries are all patent. Irregularity of the hepatic vasculature is secondary to hepatomegaly and diffuse hepatic metastases. No active bleeding demonstrated. On the venous phase, the splenic vein and portal vein are all patent. Through the [place] a 5 French catheter, a Renegade STC microcatheter was advanced over a micro glidewire into the proper hepatic artery. Proper hepatic angiogram: Proper hepatic, right and left hepatic arteries are patent. Vasospasm present in the proximal right hepatic artery related to wire manipulation. Limited assessment of the vasculature because of the contrast injection and poor opacification of the  left hepatic artery. Over a double angled Glidewire, the microcatheter was advanced into the left hepatic artery. Left hepatic angiogram performed. Left hepatic: Left hepatic artery is patent. This is the dominant supply to the left hepatic lobe lateral segment were the percutaneous biopsy was performed. Peripheral irregularity of the arterial vasculature related to hepatic metastases. No active extravasation, acute bleeding, or other acute vascular abnormality including AV fistula or pseudoaneurysm. Because of the significant recent acute perihepatic hemorrhage and a biopsy performed in the left hepatic lobe lateral segment, Gel-Foam embolization of left hepatic artery will be performed. Left hepatic artery Gel-Foam embolization: Through the microcatheter, approximately 5 cc of Gel-Foam slurry was instilled into left hepatic artery. Following embolization, there is significant reduction in the left hepatic arterial peripheral vasculature and sluggish flow but the left hepatic main artery remains patent. Catheter was retracted and advanced into a left hepatic branch supplying the medial segment. Additional left hepatic angiogram performed. Additional left hepatic angiogram: Irregularity of the parenchymal staining related to diffuse hepatic metastases. Scattered peripheral occlusions present in the additional left hepatic branch related to the Gel-Foam embolization. Vasospasm noted at the catheter site. No active bleeding demonstrated. Microcatheter was removed. A final angiogram was performed through the SOS 5 French catheter of the celiac origin. Celiac: Following left hepatic Gel-Foam embolization, there is significant reduction in the arterial vascularity to the the left hepatic lobe where the biopsy was performed. Residual vasospasm present in the proximal left hepatic artery. No other significant acute vascular process. Catheter  was retracted and advanced into the SMA. SMA angiogram performed. SMA angiogram:  SMA is widely patent. No variant SMA supply demonstrated to the liver. On the venous phase, the superior mesenteric vein and portal vein all remain patent. Access removed. Hemostasis obtained with manual compression of the right common femoral artery puncture site. Patient tolerated the procedure well. No immediate complication. IMPRESSION: Successful celiac, hepatic, and SMA angiograms as above. Successful micro catheterization, angiograms, and Gel-Foam embolization of the left hepatic artery resulting in significant reduction in arterial flow to the area the liver were the percutaneous biopsy was performed. Electronically Signed   By: Jerilynn Mages.  Shick M.D.   On: 01/19/2017 10:01   Ir Angiogram Selective Each Additional Vessel  Result Date: 01/19/2017 INDICATION: Acute abdominal pain with acute perihepatic hemorrhage 2 hours status post percutaneous left hepatic metastasis biopsy. EXAM: SELECTIVE VISCERAL ARTERIOGRAPHY; IR EMBO ART VEN HEMORR LYMPH EXTRAV INC GUIDE ROADMAPPING; ADDITIONAL ARTERIOGRAPHY; IR ULTRASOUND GUIDANCE VASC ACCESS RIGHT MEDICATIONS: 1% lidocaine locally. ANESTHESIA/SEDATION: Moderate (conscious) sedation was employed during this procedure. A total of Versed 1.0 mg and Fentanyl 25 mcg was administered intravenously. Moderate Sedation Time: 50 minutes. The patient's level of consciousness and vital signs were monitored continuously by radiology nursing throughout the procedure under my direct supervision. CONTRAST:  1 ISOVUE-300 IOPAMIDOL (ISOVUE-300) INJECTION 61%, 1 ISOVUE-300 IOPAMIDOL (ISOVUE-300) INJECTION 61% FLUOROSCOPY TIME:  Fluoroscopy Time: 14 minutes 12 seconds (144 mGy). COMPLICATIONS: None immediate. PROCEDURE: Informed consent was obtained from the patient following explanation of the procedure, risks, benefits and alternatives. The patient understands, agrees and consents for the procedure. All questions were addressed. A time out was performed prior to the initiation of the  procedure. Maximal barrier sterile technique utilized including caps, mask, sterile gowns, sterile gloves, large sterile drape, hand hygiene, and Betadine prep. Under sterile conditions and local anesthesia, ultrasound micropuncture access performed of the right common femoral artery. Five French sheath inserted. SOS catheter was utilized to select the celiac origin. Celiac angiogram performed. Celiac: Celiac origin is widely patent. Splenic, common hepatic, gastroduodenal artery, proper hepatic, right and left hepatic arteries are all patent. Irregularity of the hepatic vasculature is secondary to hepatomegaly and diffuse hepatic metastases. No active bleeding demonstrated. On the venous phase, the splenic vein and portal vein are all patent. Through the [place] a 5 French catheter, a Renegade STC microcatheter was advanced over a micro glidewire into the proper hepatic artery. Proper hepatic angiogram: Proper hepatic, right and left hepatic arteries are patent. Vasospasm present in the proximal right hepatic artery related to wire manipulation. Limited assessment of the vasculature because of the contrast injection and poor opacification of the left hepatic artery. Over a double angled Glidewire, the microcatheter was advanced into the left hepatic artery. Left hepatic angiogram performed. Left hepatic: Left hepatic artery is patent. This is the dominant supply to the left hepatic lobe lateral segment were the percutaneous biopsy was performed. Peripheral irregularity of the arterial vasculature related to hepatic metastases. No active extravasation, acute bleeding, or other acute vascular abnormality including AV fistula or pseudoaneurysm. Because of the significant recent acute perihepatic hemorrhage and a biopsy performed in the left hepatic lobe lateral segment, Gel-Foam embolization of left hepatic artery will be performed. Left hepatic artery Gel-Foam embolization: Through the microcatheter, approximately 5 cc  of Gel-Foam slurry was instilled into left hepatic artery. Following embolization, there is significant reduction in the left hepatic arterial peripheral vasculature and sluggish flow but the left hepatic main artery remains patent. Catheter was retracted  and advanced into a left hepatic branch supplying the medial segment. Additional left hepatic angiogram performed. Additional left hepatic angiogram: Irregularity of the parenchymal staining related to diffuse hepatic metastases. Scattered peripheral occlusions present in the additional left hepatic branch related to the Gel-Foam embolization. Vasospasm noted at the catheter site. No active bleeding demonstrated. Microcatheter was removed. A final angiogram was performed through the SOS 5 French catheter of the celiac origin. Celiac: Following left hepatic Gel-Foam embolization, there is significant reduction in the arterial vascularity to the the left hepatic lobe where the biopsy was performed. Residual vasospasm present in the proximal left hepatic artery. No other significant acute vascular process. Catheter was retracted and advanced into the SMA. SMA angiogram performed. SMA angiogram: SMA is widely patent. No variant SMA supply demonstrated to the liver. On the venous phase, the superior mesenteric vein and portal vein all remain patent. Access removed. Hemostasis obtained with manual compression of the right common femoral artery puncture site. Patient tolerated the procedure well. No immediate complication. IMPRESSION: Successful celiac, hepatic, and SMA angiograms as above. Successful micro catheterization, angiograms, and Gel-Foam embolization of the left hepatic artery resulting in significant reduction in arterial flow to the area the liver were the percutaneous biopsy was performed. Electronically Signed   By: Jerilynn Mages.  Shick M.D.   On: 01/19/2017 10:01   Nm Pet Image Initial (pi) Skull Base To Thigh  Result Date: 02/04/2017 CLINICAL DATA:  Initial  treatment strategy for breast cancer with liver metastases. EXAM: NUCLEAR MEDICINE PET SKULL BASE TO THIGH TECHNIQUE: 12.6 mCi F-18 FDG was injected intravenously. Full-ring PET imaging was performed from the skull base to thigh after the radiotracer. CT data was obtained and used for attenuation correction and anatomic localization. FASTING BLOOD GLUCOSE:  Value: 96 mg/dl COMPARISON:  CT chest 01/22/2017, CT abdomen pelvis 01/20/2017 and MR abdomen 01/15/2017. FINDINGS: NECK No hypermetabolic lymph nodes in the neck. CT images show no acute findings. CHEST Low left internal jugular lymph node measures 9 mm (CT image 59) with an SUV max of 2.5. No additional hypermetabolic mediastinal, hilar or axillary lymph nodes. No hypermetabolic pulmonary nodules. Atherosclerotic calcification of the arterial vasculature, including coronary arteries. No pericardial effusion. Small bilateral pleural effusions. ABDOMEN/PELVIS Hypermetabolic lesions in the liver measure up to approximately 7.0 cm in the inferior right hepatic lobe, with an SUV max of 9.4. No abnormal hypermetabolism in the adrenal glands, spleen or pancreas. Hypermetabolic gastrohepatic ligament lymph nodes measure up to approximately 10 mm with an SUV max of 3.2. Hypermetabolic porta hepatis adenopathy is difficult to measure without IV contrast but has an SUV max 6.9. Hypermetabolic left periaortic lymph node measures 8 mm (CT image 160) with an SUV max of 5.6. Stones are seen in the gallbladder. Adrenal glands, kidneys, spleen, pancreas, stomach and bowel are grossly unremarkable. Moderate ascites. SKELETON No abnormal osseous hypermetabolism. IMPRESSION: 1. Hypermetabolic hepatic metastatic disease with hypermetabolic gastrohepatic ligament, porta hepatis and retroperitoneal lymph nodes. 2. Moderate ascites. 3. Small bilateral pleural effusions. 4. Aortic atherosclerosis (ICD10-170.0). Coronary artery calcification. 5. Cholelithiasis. Electronically Signed    By: Lorin Picket M.D.   On: 02/04/2017 10:20   US Venous Img Lower Bilateral  Result Date: 02/11/2017 CLINICAL DATA:  Lower extremity edema.  Metastatic breast carcinoma EXAM: BILATERAL LOWER EXTREMITY VENOUS DUPLEX ULTRASOUND TECHNIQUE: Gray-scale sonography with graded compression, as well as color Doppler and duplex ultrasound were performed to evaluate the lower extremity deep venous systems from the level of the common femoral vein and including  the common femoral, femoral, profunda femoral, popliteal and calf veins including the posterior tibial, peroneal and gastrocnemius veins when visible. The superficial great saphenous vein was also interrogated. Spectral Doppler was utilized to evaluate flow at rest and with distal augmentation maneuvers in the common femoral, femoral and popliteal veins. COMPARISON:  None. FINDINGS: RIGHT LOWER EXTREMITY Common Femoral Vein: No evidence of thrombus. Normal compressibility, respiratory phasicity and response to augmentation. Saphenofemoral Junction: No evidence of thrombus. Normal compressibility and flow on color Doppler imaging. Profunda Femoral Vein: No evidence of thrombus. Normal compressibility and flow on color Doppler imaging. Femoral Vein: No evidence of thrombus. Normal compressibility, respiratory phasicity and response to augmentation. Popliteal Vein: No evidence of thrombus. Normal compressibility, respiratory phasicity and response to augmentation. Calf Veins: No evidence of thrombus in the areas visualized. Visualization less than optimal due to lower extremity edema. Normal compressibility and flow on color Doppler imaging. Superficial Great Saphenous Vein: No evidence of thrombus. Normal compressibility and flow on color Doppler imaging. Venous Reflux:  None. Other Findings:  Extensive lower extremity edema. LEFT LOWER EXTREMITY Common Femoral Vein: No evidence of thrombus. Normal compressibility, respiratory phasicity and response to  augmentation. Saphenofemoral Junction: No evidence of thrombus. Normal compressibility and flow on color Doppler imaging. Profunda Femoral Vein: No evidence of thrombus. Normal compressibility and flow on color Doppler imaging. Femoral Vein: No evidence of thrombus. Normal compressibility, respiratory phasicity and response to augmentation. Popliteal Vein: No evidence of thrombus. Normal compressibility, respiratory phasicity and response to augmentation. Calf Veins: No evidence of thrombus in visualized regions. Visualization less than optimal due to lower extremity edema. Normal compressibility and flow on color Doppler imaging. Superficial Great Saphenous Vein: No evidence of thrombus. Normal compressibility and flow on color Doppler imaging. Venous Reflux:  None. Other Findings:  Extensive lower extremity edema. IMPRESSION: No evidence of deep venous thrombosis in either lower extremity. Calf vein visualization less than optimal due to extensive lower extremity edema bilaterally. Electronically Signed   By: Lowella Grip III M.D.   On: 02/11/2017 12:12   Ir US Guide Vasc Access Right  Result Date: 01/19/2017 INDICATION: Acute abdominal pain with acute perihepatic hemorrhage 2 hours status post percutaneous left hepatic metastasis biopsy. EXAM: SELECTIVE VISCERAL ARTERIOGRAPHY; IR EMBO ART VEN HEMORR LYMPH EXTRAV INC GUIDE ROADMAPPING; ADDITIONAL ARTERIOGRAPHY; IR ULTRASOUND GUIDANCE VASC ACCESS RIGHT MEDICATIONS: 1% lidocaine locally. ANESTHESIA/SEDATION: Moderate (conscious) sedation was employed during this procedure. A total of Versed 1.0 mg and Fentanyl 25 mcg was administered intravenously. Moderate Sedation Time: 50 minutes. The patient's level of consciousness and vital signs were monitored continuously by radiology nursing throughout the procedure under my direct supervision. CONTRAST:  1 ISOVUE-300 IOPAMIDOL (ISOVUE-300) INJECTION 61%, 1 ISOVUE-300 IOPAMIDOL (ISOVUE-300) INJECTION 61%  FLUOROSCOPY TIME:  Fluoroscopy Time: 14 minutes 12 seconds (144 mGy). COMPLICATIONS: None immediate. PROCEDURE: Informed consent was obtained from the patient following explanation of the procedure, risks, benefits and alternatives. The patient understands, agrees and consents for the procedure. All questions were addressed. A time out was performed prior to the initiation of the procedure. Maximal barrier sterile technique utilized including caps, mask, sterile gowns, sterile gloves, large sterile drape, hand hygiene, and Betadine prep. Under sterile conditions and local anesthesia, ultrasound micropuncture access performed of the right common femoral artery. Five French sheath inserted. SOS catheter was utilized to select the celiac origin. Celiac angiogram performed. Celiac: Celiac origin is widely patent. Splenic, common hepatic, gastroduodenal artery, proper hepatic, right and left hepatic arteries are all patent. Irregularity of  the hepatic vasculature is secondary to hepatomegaly and diffuse hepatic metastases. No active bleeding demonstrated. On the venous phase, the splenic vein and portal vein are all patent. Through the [place] a 5 French catheter, a Renegade STC microcatheter was advanced over a micro glidewire into the proper hepatic artery. Proper hepatic angiogram: Proper hepatic, right and left hepatic arteries are patent. Vasospasm present in the proximal right hepatic artery related to wire manipulation. Limited assessment of the vasculature because of the contrast injection and poor opacification of the left hepatic artery. Over a double angled Glidewire, the microcatheter was advanced into the left hepatic artery. Left hepatic angiogram performed. Left hepatic: Left hepatic artery is patent. This is the dominant supply to the left hepatic lobe lateral segment were the percutaneous biopsy was performed. Peripheral irregularity of the arterial vasculature related to hepatic metastases. No active  extravasation, acute bleeding, or other acute vascular abnormality including AV fistula or pseudoaneurysm. Because of the significant recent acute perihepatic hemorrhage and a biopsy performed in the left hepatic lobe lateral segment, Gel-Foam embolization of left hepatic artery will be performed. Left hepatic artery Gel-Foam embolization: Through the microcatheter, approximately 5 cc of Gel-Foam slurry was instilled into left hepatic artery. Following embolization, there is significant reduction in the left hepatic arterial peripheral vasculature and sluggish flow but the left hepatic main artery remains patent. Catheter was retracted and advanced into a left hepatic branch supplying the medial segment. Additional left hepatic angiogram performed. Additional left hepatic angiogram: Irregularity of the parenchymal staining related to diffuse hepatic metastases. Scattered peripheral occlusions present in the additional left hepatic branch related to the Gel-Foam embolization. Vasospasm noted at the catheter site. No active bleeding demonstrated. Microcatheter was removed. A final angiogram was performed through the SOS 5 French catheter of the celiac origin. Celiac: Following left hepatic Gel-Foam embolization, there is significant reduction in the arterial vascularity to the the left hepatic lobe where the biopsy was performed. Residual vasospasm present in the proximal left hepatic artery. No other significant acute vascular process. Catheter was retracted and advanced into the SMA. SMA angiogram performed. SMA angiogram: SMA is widely patent. No variant SMA supply demonstrated to the liver. On the venous phase, the superior mesenteric vein and portal vein all remain patent. Access removed. Hemostasis obtained with manual compression of the right common femoral artery puncture site. Patient tolerated the procedure well. No immediate complication. IMPRESSION: Successful celiac, hepatic, and SMA angiograms as above.  Successful micro catheterization, angiograms, and Gel-Foam embolization of the left hepatic artery resulting in significant reduction in arterial flow to the area the liver were the percutaneous biopsy was performed. Electronically Signed   By: Jerilynn Mages.  Shick M.D.   On: 01/19/2017 10:01   Dg Chest Port 1v Same Day  Result Date: 01/18/2017 CLINICAL DATA:  Acute onset of shortness of breath, vomiting and epigastric abdominal pain. Fever. Initial encounter. EXAM: PORTABLE CHEST 1 VIEW COMPARISON:  Chest radiograph performed 01/16/2017 FINDINGS: The lungs are hyperexpanded, with flattening of the hemidiaphragms, compatible with COPD. There is no evidence of focal opacification, pleural effusion or pneumothorax. The cardiomediastinal silhouette is within normal limits. No acute osseous abnormalities are seen. IMPRESSION: Findings of COPD.  Lungs remain otherwise clear. Electronically Signed   By: Garald Balding M.D.   On: 01/18/2017 23:48   Dg Abd Portable 1v  Result Date: 01/18/2017 CLINICAL DATA:  Acute onset of shortness of breath, vomiting and epigastric abdominal pain. Recent liver biopsy. Fever. Initial encounter. EXAM: PORTABLE ABDOMEN - 1  VIEW COMPARISON:  None. FINDINGS: The visualized bowel gas pattern is unremarkable. Scattered air and stool filled loops of colon are seen; no abnormal dilatation of small bowel loops is seen to suggest small bowel obstruction. No free intra-abdominal air is identified, though evaluation for free air is limited on a single supine view. The stomach is largely filled with air. The visualized osseous structures are within normal limits; the sacroiliac joints are unremarkable in appearance. IMPRESSION: Unremarkable bowel gas pattern; no free intra-abdominal air seen. Moderate amount of stool noted in the colon. Electronically Signed   By: Garald Balding M.D.   On: 01/18/2017 23:50   US Breast Ltd Uni Left Inc Axilla  Addendum Date: 02/11/2017   ADDENDUM REPORT: 02/11/2017  07:47 ADDENDUM: There is a dictation error in the report. Recommendation should read "Given patient's history of a LIVER BIOPSY demonstrating metastatic breast cancer, I recommend breast MRI to evaluate for occult breast cancer. Electronically Signed   By: Franki Cabot M.D.   On: 02/11/2017 07:47   Result Date: 02/11/2017 CLINICAL DATA:  Status post liver biopsy demonstrating metastatic breast cancer. This is patient's baseline mammogram. EXAM: 2D DIGITAL DIAGNOSTIC BILATERAL MAMMOGRAM WITH CAD AND ADJUNCT TOMO ULTRASOUND LEFT BREAST COMPARISON:  None. ACR Breast Density Category c: The breast tissue is heterogeneously dense, which may obscure small masses. FINDINGS: Bilateral 2D CC and MLO projections were obtained, with additional 3D tomosynthesis. There are no dominant masses, suspicious calcifications or secondary signs of malignancy within either breast. There is an asymmetry within the lower left breast, compatible with superimposition of normal dense fibroglandular tissues on 3D tomosynthesis images, for which ultrasound will be performed to ensure benignity. Mammographic images were processed with CAD. Targeted ultrasound is performed, evaluating the lower outer quadrant of the left breast, showing only normal fibroglandular tissues and fat lobules. There is a ridge of normal dense fibroglandular tissue within the lower outer quadrant of the left breast corresponding to the mammographic finding. IMPRESSION: No evidence of malignancy within either breast. RECOMMENDATION: Given patient's history of a left breast biopsy demonstrating metastatic breast cancer, I recommend breast MRI to evaluate for occult breast cancer. These results and recommendations were called by telephone at the time of interpretation on 02/01/2017 at 12:25 pm to Dr. Astrid Divine RAO , who verbally acknowledged these results. I have discussed the findings and recommendations with the patient. Results were also provided in writing at the  conclusion of the visit. If applicable, a reminder letter will be sent to the patient regarding the next appointment. BI-RADS CATEGORY  1: Negative. Electronically Signed: By: Franki Cabot M.D. On: 02/01/2017 13:29   Mm Diag Breast Tomo Bilateral  Addendum Date: 02/11/2017   ADDENDUM REPORT: 02/11/2017 07:47 ADDENDUM: There is a dictation error in the report. Recommendation should read "Given patient's history of a LIVER BIOPSY demonstrating metastatic breast cancer, I recommend breast MRI to evaluate for occult breast cancer. Electronically Signed   By: Franki Cabot M.D.   On: 02/11/2017 07:47   Result Date: 02/11/2017 CLINICAL DATA:  Status post liver biopsy demonstrating metastatic breast cancer. This is patient's baseline mammogram. EXAM: 2D DIGITAL DIAGNOSTIC BILATERAL MAMMOGRAM WITH CAD AND ADJUNCT TOMO ULTRASOUND LEFT BREAST COMPARISON:  None. ACR Breast Density Category c: The breast tissue is heterogeneously dense, which may obscure small masses. FINDINGS: Bilateral 2D CC and MLO projections were obtained, with additional 3D tomosynthesis. There are no dominant masses, suspicious calcifications or secondary signs of malignancy within either breast. There is an asymmetry within  the lower left breast, compatible with superimposition of normal dense fibroglandular tissues on 3D tomosynthesis images, for which ultrasound will be performed to ensure benignity. Mammographic images were processed with CAD. Targeted ultrasound is performed, evaluating the lower outer quadrant of the left breast, showing only normal fibroglandular tissues and fat lobules. There is a ridge of normal dense fibroglandular tissue within the lower outer quadrant of the left breast corresponding to the mammographic finding. IMPRESSION: No evidence of malignancy within either breast. RECOMMENDATION: Given patient's history of a left breast biopsy demonstrating metastatic breast cancer, I recommend breast MRI to evaluate for occult  breast cancer. These results and recommendations were called by telephone at the time of interpretation on 02/01/2017 at 12:25 pm to Dr. Astrid Divine RAO , who verbally acknowledged these results. I have discussed the findings and recommendations with the patient. Results were also provided in writing at the conclusion of the visit. If applicable, a reminder letter will be sent to the patient regarding the next appointment. BI-RADS CATEGORY  1: Negative. Electronically Signed: By: Franki Cabot M.D. On: 02/01/2017 13:29   [place] Guide Roadmapping  Result Date: 01/19/2017 INDICATION: Acute abdominal pain with acute perihepatic hemorrhage 2 hours status post percutaneous left hepatic metastasis biopsy. EXAM: SELECTIVE VISCERAL ARTERIOGRAPHY; IR EMBO ART VEN HEMORR LYMPH EXTRAV INC GUIDE ROADMAPPING; ADDITIONAL ARTERIOGRAPHY; IR ULTRASOUND GUIDANCE VASC ACCESS RIGHT MEDICATIONS: 1% lidocaine locally. ANESTHESIA/SEDATION: Moderate (conscious) sedation was employed during this procedure. A total of Versed 1.0 mg and Fentanyl 25 mcg was administered intravenously. Moderate Sedation Time: 50 minutes. The patient's level of consciousness and vital signs were monitored continuously by radiology nursing throughout the procedure under my direct supervision. CONTRAST:  1 ISOVUE-300 IOPAMIDOL (ISOVUE-300) INJECTION 61%, 1 ISOVUE-300 IOPAMIDOL (ISOVUE-300) INJECTION 61% FLUOROSCOPY TIME:  Fluoroscopy Time: 14 minutes 12 seconds (144 mGy). COMPLICATIONS: None immediate. PROCEDURE: Informed consent was obtained from the patient following explanation of the procedure, risks, benefits and alternatives. The patient understands, agrees and consents for the procedure. All questions were addressed. A time out was performed prior to the initiation of the procedure. Maximal barrier sterile technique utilized including caps, mask, sterile gowns, sterile gloves, large sterile drape, hand hygiene, and Betadine  prep. Under sterile conditions and local anesthesia, ultrasound micropuncture access performed of the right common femoral artery. Five French sheath inserted. SOS catheter was utilized to select the celiac origin. Celiac angiogram performed. Celiac: Celiac origin is widely patent. Splenic, common hepatic, gastroduodenal artery, proper hepatic, right and left hepatic arteries are all patent. Irregularity of the hepatic vasculature is secondary to hepatomegaly and diffuse hepatic metastases. No active bleeding demonstrated. On the venous phase, the splenic vein and portal vein are all patent. Through the [place] a 5 French catheter, a Renegade STC microcatheter was advanced over a micro glidewire into the proper hepatic artery. Proper hepatic angiogram: Proper hepatic, right and left hepatic arteries are patent. Vasospasm present in the proximal right hepatic artery related to wire manipulation. Limited assessment of the vasculature because of the contrast injection and poor opacification of the left hepatic artery. Over a double angled Glidewire, the microcatheter was advanced into the left hepatic artery. Left hepatic angiogram performed. Left hepatic: Left hepatic artery is patent. This is the dominant supply to the left hepatic lobe lateral segment were the percutaneous biopsy was performed. Peripheral irregularity of the arterial vasculature related to hepatic metastases. No active extravasation, acute bleeding, or other acute vascular abnormality including AV fistula  or pseudoaneurysm. Because of the significant recent acute perihepatic hemorrhage and a biopsy performed in the left hepatic lobe lateral segment, Gel-Foam embolization of left hepatic artery will be performed. Left hepatic artery Gel-Foam embolization: Through the microcatheter, approximately 5 cc of Gel-Foam slurry was instilled into left hepatic artery. Following embolization, there is significant reduction in the left hepatic arterial peripheral  vasculature and sluggish flow but the left hepatic main artery remains patent. Catheter was retracted and advanced into a left hepatic branch supplying the medial segment. Additional left hepatic angiogram performed. Additional left hepatic angiogram: Irregularity of the parenchymal staining related to diffuse hepatic metastases. Scattered peripheral occlusions present in the additional left hepatic branch related to the Gel-Foam embolization. Vasospasm noted at the catheter site. No active bleeding demonstrated. Microcatheter was removed. A final angiogram was performed through the SOS 5 French catheter of the celiac origin. Celiac: Following left hepatic Gel-Foam embolization, there is significant reduction in the arterial vascularity to the the left hepatic lobe where the biopsy was performed. Residual vasospasm present in the proximal left hepatic artery. No other significant acute vascular process. Catheter was retracted and advanced into the SMA. SMA angiogram performed. SMA angiogram: SMA is widely patent. No variant SMA supply demonstrated to the liver. On the venous phase, the superior mesenteric vein and portal vein all remain patent. Access removed. Hemostasis obtained with manual compression of the right common femoral artery puncture site. Patient tolerated the procedure well. No immediate complication. IMPRESSION: Successful celiac, hepatic, and SMA angiograms as above. Successful micro catheterization, angiograms, and Gel-Foam embolization of the left hepatic artery resulting in significant reduction in arterial flow to the area the liver were the percutaneous biopsy was performed. Electronically Signed   By: Jerilynn Mages.  Shick M.D.   On: 01/19/2017 10:01   Ir Fluoro Guide Port Insertion Right  Result Date: 02/06/2017 CLINICAL DATA:  pt with new dx of metastatic breast cancer with mets to liver EXAM: TUNNEL POWER PORT PLACEMENT WITH SUBCUTANEOUS POCKET UTILIZING ULTRASOUND & FLOUROSCOPY FLUOROSCOPY TIME:   18 seconds.  Two mGy. MEDICATIONS AND MEDICAL HISTORY: Versed 2 mg, Fentanyl 75 mcg. Additional Medications: None. Antibiotics were given within 2 hours of the procedure. ANESTHESIA/SEDATION: Moderate sedation time: 32 minutes. Nursing monitored the the patient during the procedure. PROCEDURE: After written informed consent was obtained, patient was placed in the supine position on angiographic table. The right neck and chest was prepped and draped in a sterile fashion. Lidocaine was utilized for local anesthesia. The right jugular vein was noted to be patent initially with ultrasound. Under sonographic guidance, a micropuncture needle was inserted into the right IJ vein (Ultrasound and fluoroscopic image documentation was performed). The needle was removed over an 018 wire which was exchanged for a Amplatz. This was advanced into the IVC. An 8-French dilator was advanced over the Amplatz. A small incision was made in the right upper chest over the anterior right second rib. Utilizing blunt dissection, a subcutaneous pocket was created in the caudal direction. The pocket was irrigated with a copious amount of sterile normal saline. The port catheter was tunneled from the chest incision, and out the neck incision. The reservoir was inserted into the subcutaneous pocket and secured with two 3-0 Ethilon stitches. A peel-away sheath was advanced over the Amplatz wire. The port catheter was cut to measure length and inserted through the peel-away sheath. The peel-away sheath was removed. The chest incision was closed with 3-0 Vicryl interrupted stitches for the subcutaneous tissue and a running  of 4-0 Vicryl subcuticular stitch for the skin. The neck incision was closed with a 4-0 Vicryl subcuticular stitch. Derma-bond was applied to both surgical incisions. The port reservoir was flushed and instilled with heparinized saline. No complications. FINDINGS: A right IJ vein Port-A-Cath is in place with its tip at the  cavoatrial junction. COMPLICATIONS: None IMPRESSION: Successful 8 French right internal jugular vein power port placement with its tip at the SVC/RA junction. Electronically Signed   By: Marybelle Killings M.D.   On: 02/06/2017 13:20   Ct Angio Abd/pel W/ And/or W/o  Result Date: 01/20/2017 CLINICAL DATA:  Hepatic metastases, status post ultrasound percutaneous biopsy which was complicated by acute bleeding 2 hours after the biopsy. Patient subsequently underwent Gel-Foam embolization of left hepatic artery. Despite transfusions, further decrease in hemoglobin this morning. EXAM: CT ANGIOGRAPHY ABDOMEN AND PELVIS TECHNIQUE: Multidetector CT imaging of the abdomen and pelvis was performed using the standard protocol during bolus administration of intravenous contrast. Multiplanar reconstructed images including MIPs were obtained and reviewed to evaluate the vascular anatomy. CONTRAST:  100 cc Isovue COMPARISON:  01/18/2017 FINDINGS: Arterial findings: Aorta: Scattered moderate atherosclerosis of the aorta, most pronounced in the infrarenal aorta without occlusion, aneurysm or dissection. No retroperitoneal hemorrhage. Celiac axis: Atherosclerotic origin but remains patent. Splenic, left gastric, and hepatic branches appear patent. No active bleeding or acute vascular process. Superior mesenteric: Atherosclerotic origin but remains patent. SMA branches appear patent. No active bleeding. Left renal:           Mild atherosclerosis but widely patent. Right renal:          Mild atherosclerosis but widely patent. Inferior mesenteric:  Atherosclerotic origin but remains patent. Left iliac: Iliac atherosclerosis noted without occlusion. Left common, internal and external iliac arteries are patent. Right iliac: Similar atherosclerosis without occlusion. Right common, internal and external iliac arteries are patent. Venous findings: Hepatic veins, portal, splenic, mesenteric veins are patent. No veno-occlusive process. IVC,  renal veins common iliac veins are patent. Review of the MIP images confirms the above findings. Nonvascular findings: Basilar emphysema noted. Minor atelectasis. Normal heart size. Trace pleural effusions. No pericardial effusion. Hypodense peripherally enhancing hepatic masses again evident compatible with metastases. No evidence of active bleeding or acute vascular process within the liver. No biliary dilatation. Gallbladder is collapsed but contains partially calcified gallstones. Moderate Heterogeneous perihepatic free fluid, compatible with a mixture of ascites and known hemoperitoneum related to the recent left hepatic mass biopsy. Spleen is unremarkable. Small amount of hemoperitoneum about the spleen upper pole. Adrenal glands, kidneys, ureters, and bladder demonstrate no acute process, obstruction or hydronephrosis. Negative for bowel obstruction, significant dilatation, ileus, or free air. No adenopathy Remote hysterectomy.  Pelvic ascites also evident. No inguinal abnormality or hernia. Intact abdominal wall. No rectus sheath hemorrhage or hematoma over the left hepatic biopsy site. Degenerative changes of the spine. Bones are mildly osteopenic. No acute osseous finding. IMPRESSION: Negative for acute or active intra-abdominal bleeding. Stable diffuse hepatic metastatic process. Stable perihepatic ascites and hemoperitoneum related to the recent left hepatic mass biopsy. Intact abdominal wall without rectus sheath hematoma or hemorrhage. Aortoiliac atherosclerosis Cholelithiasis Pelvic ascites as well These results were called by telephone at the time of interpretation on 01/20/2017 at 2:13 pm to Dr. Florencia Reasons , who verbally acknowledged these results. Electronically Signed   By: Jerilynn Mages.  Shick M.D.   On: 01/20/2017 14:15   US Abdomen Limited Ruq  Result Date: 01/18/2017 CLINICAL DATA:  61 year old female with a history of  multiple liver masses, status post percutaneous biopsy today's date. Interval  development of abdominal pain EXAM: US ABDOMEN LIMITED - RIGHT UPPER QUADRANT COMPARISON:  Ultrasound 01/18/2017, MRI 01/15/2017 FINDINGS: Gallbladder: Contracted gallbladder with echogenic focus in the lumen compatible with cholelithiasis. Common bile duct: Diameter: 4 mm -5 mm Liver: Re- demonstration of mass within right liver lobe 7 point far cm by 5.4 cm x 7.4 cm. The biopsy of the left liver lobe on the comparison ultrasound today's date demonstrates small echogenic focus, compatible with hemostatic material. There is hyperechoic free fluid adjacent to the liver margin, both anterior and posterior to the left liver lobe. IMPRESSION: New hyperechoic free fluid adjacent to the left liver lobe, status post percutaneous biopsy, compatible with hemorrhage. Re- demonstration of liver masses, better characterized on prior MRI. These results were called by telephone at the time of interpretation on 01/18/2017 at 5:42 pm to Dr. Annamaria Boots who verbally acknowledged these results. Electronically Signed   By: Corrie Mckusick D.O.   On: 01/18/2017 17:43     Assessment and plan- Patient is a 61 y.o. 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  1. Dr. Janese Banks discussed the results of the PET CT scan as well as mammogram with the patient in detail on 02/08/2017. Patient does have significant liver metastases and second opinion from pathology also confirms that immunohistochemistry is consistent with breast primary although no breast masses been found on imaging so far. Tissue of origin testing was pending. However patient was feeling poorly and was quite symptomatic from liver metastases. Hence at that time she was started on palliative chemotherapy with every week carboplatin AUC 2 along with Taxol 60 mg/meter square Iv (reduced from 80 mg/meetrsquare due to anemia and poor PS) on a  weekly basis 3 weeks on and 1 week off. Dr Janese Banks again discussed the risks and benefits of chemotherapy including all but not limited to fatigue, nausea, vomiting, low blood counts, risk of infection and hair loss. Side effects of peripheral neuropathy associated with Taxol as well as allergic reactions to Taxol. Patient understands and agrees to proceed. Patient understands that chemotherapy at this time is palliative and not curative to improve her quality of life and longevity.    Just after her Taxol infusion was started on 02/08/2017, she felt warm and her oxygen saturations were in the 50s. Patient did not feel short of breath and when examined but had mild scattered wheezing. She received a dose of Solu-Medrol as well as Albuterol nebulized treatment. Her saturations improved to 95% on 2L. It was unclear if this was from her Taxol infusion. Patient completed her carbo/taxol infusion otherwise uneventfully and reported no SOB or discomfort after infusion was completed. Patient is a long term smoker and may have baseline hypoxia. She may have also had some volume overload.  She was sent home on 2L O2. This needs to be assessed during next visit. She was instructed to call us if she were to develop any signs and symptoms over the weekend.   2.  Dr. Mike Gip discussed CancerTypeID testing results from 02/12/2017 from 01/18/2017 specimen with patient.  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  95% confident that tumor is not of breast adenocarcinoma origin.  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  Consider switch to every 3 week therapy if tolerates weekly treatment.  3. Labs today: CBC with diff, CMP, Mg.  Review labs from 02/12/2017 (CBC with diff, CMP, BNP, APTT, PT-INR).  4. Week #2 carboplatin/taxol today.  Discussed possibility of  reaction.  Patient consented to treatment.  5. Chronic hyponatremia and hypochloremia- this improved slightly after normal saline infusion. However, concerns for volume overload and worsening CHF.  No extra fluids today.  Follow-up in the CHF clinic.  Reassess next week.  Potassium has normalized.    6. Hypomagnesemia-  Magnesium is 1.1 today.  Infuse 4 gm magnesium today.  7. Anemia- ferritin high likely from malignancy. Serum iron has been low. B12 elevated. Folate and TSH normal. Feraheme this week per prior plan.  8. Neoplasm related pain- controlled with medications.  Dr. Janese Banks had discussed taking oxycodone Q4 hours instead of Q6 hours.  Pain decreased from a level 8 to 5 out of 10.  9. Sacral decubitus ulcer - refer to wound clinic for assessment and management.  10. Congestive heart failure: BNP 1418. Bilateral lower extremity 3+ pitting edema. Patient had been taking Lasix, but previously not drinking enough and not urinating much. She has started drinking a bit more fluids. Refer to CHF clinic for assessment and management.  11.  Thrush:  Patient started on clotrimazole troches.  If no significant improvement in 1 week, begin fluconazole.  12.  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  Treatment is palliative.  Patient wants to receive treatment and receive CPR, chest compressions, ventilation "up to a point".  She does not want to be kept alive if her situation is not reversible.  13.  RTC in 1 week for MD assessment, labs, and cycle # 3 of chemotherapy   Visit Diagnosis 1. 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 Decubitus ulcer of sacral region, stage 2   3. Cancer related pain   4. Thrush   5. Hypomagnesemia   6. Pulmonary HTN   7. Encounter for antineoplastic chemotherapy     I saw and evaluated the patient, participating in the key portions of the service and  reviewing pertinent diagnostic studies and records.  I reviewed the nurse practitioner's note and agree with the findings and the plan.  The assessment and plan were discussed with the patient.  Multiple questions were asked by the patient and answered.    Lucendia Herrlich, NP    Lequita Asal, MD 02/15/17 2:45 PM

## 2017-02-15 NOTE — Progress Notes (Signed)
Patient's tongue coated in white fungus.  States she is having right abdominal pain today 6/10.  Took pain medication @ 8:30.

## 2017-02-16 LAB — CANCER ANTIGEN 27.29: CA 27.29: 198.6 U/mL — ABNORMAL HIGH (ref 0.0–38.6)

## 2017-02-17 DIAGNOSIS — B370 Candidal stomatitis: Secondary | ICD-10-CM | POA: Insufficient documentation

## 2017-02-17 DIAGNOSIS — G893 Neoplasm related pain (acute) (chronic): Secondary | ICD-10-CM | POA: Insufficient documentation

## 2017-02-17 DIAGNOSIS — L98429 Non-pressure chronic ulcer of back with unspecified severity: Secondary | ICD-10-CM | POA: Insufficient documentation

## 2017-02-17 DIAGNOSIS — C787 Secondary malignant neoplasm of liver and intrahepatic bile duct: Secondary | ICD-10-CM | POA: Insufficient documentation

## 2017-02-19 ENCOUNTER — Telehealth: Payer: Self-pay

## 2017-02-19 DIAGNOSIS — F17210 Nicotine dependence, cigarettes, uncomplicated: Secondary | ICD-10-CM | POA: Diagnosis not present

## 2017-02-19 DIAGNOSIS — G43909 Migraine, unspecified, not intractable, without status migrainosus: Secondary | ICD-10-CM | POA: Diagnosis not present

## 2017-02-19 DIAGNOSIS — G894 Chronic pain syndrome: Secondary | ICD-10-CM | POA: Diagnosis not present

## 2017-02-19 DIAGNOSIS — M5136 Other intervertebral disc degeneration, lumbar region: Secondary | ICD-10-CM | POA: Diagnosis not present

## 2017-02-19 DIAGNOSIS — M5090 Cervical disc disorder, unspecified, unspecified cervical region: Secondary | ICD-10-CM | POA: Diagnosis not present

## 2017-02-19 DIAGNOSIS — D376 Neoplasm of uncertain behavior of liver, gallbladder and bile ducts: Secondary | ICD-10-CM | POA: Diagnosis not present

## 2017-02-19 NOTE — Telephone Encounter (Signed)
Initial appointment made for 02/21/2017 at 11:20 am. Pt states that she is currently on lasix and potassium from her cancer Dr. But will run out by Friday and would like to be seen before then.

## 2017-02-19 NOTE — Telephone Encounter (Signed)
-----   Message from Alisa Graff, [place] sent at 02/18/2017  8:43 AM EDT ----- Regarding: please cal patient Contact: [phone] Cancer center MD sent the referral.

## 2017-02-21 ENCOUNTER — Ambulatory Visit: Payer: Medicare Other | Attending: Family | Admitting: Family

## 2017-02-21 ENCOUNTER — Telehealth: Payer: Self-pay | Admitting: *Deleted

## 2017-02-21 ENCOUNTER — Encounter: Payer: Self-pay | Admitting: Family

## 2017-02-21 VITALS — BP 110/70 | HR 77 | Resp 20 | Ht 63.0 in | Wt 119.0 lb

## 2017-02-21 DIAGNOSIS — Z79899 Other long term (current) drug therapy: Secondary | ICD-10-CM | POA: Diagnosis not present

## 2017-02-21 DIAGNOSIS — Z825 Family history of asthma and other chronic lower respiratory diseases: Secondary | ICD-10-CM | POA: Insufficient documentation

## 2017-02-21 DIAGNOSIS — I950 Idiopathic hypotension: Secondary | ICD-10-CM

## 2017-02-21 DIAGNOSIS — D649 Anemia, unspecified: Secondary | ICD-10-CM | POA: Diagnosis not present

## 2017-02-21 DIAGNOSIS — M5136 Other intervertebral disc degeneration, lumbar region: Secondary | ICD-10-CM | POA: Diagnosis not present

## 2017-02-21 DIAGNOSIS — Z888 Allergy status to other drugs, medicaments and biological substances status: Secondary | ICD-10-CM | POA: Insufficient documentation

## 2017-02-21 DIAGNOSIS — I5032 Chronic diastolic (congestive) heart failure: Secondary | ICD-10-CM

## 2017-02-21 DIAGNOSIS — Z800 Family history of malignant neoplasm of digestive organs: Secondary | ICD-10-CM | POA: Diagnosis not present

## 2017-02-21 DIAGNOSIS — I959 Hypotension, unspecified: Secondary | ICD-10-CM | POA: Insufficient documentation

## 2017-02-21 DIAGNOSIS — Z9889 Other specified postprocedural states: Secondary | ICD-10-CM | POA: Insufficient documentation

## 2017-02-21 DIAGNOSIS — Z90710 Acquired absence of both cervix and uterus: Secondary | ICD-10-CM | POA: Diagnosis not present

## 2017-02-21 DIAGNOSIS — G43909 Migraine, unspecified, not intractable, without status migrainosus: Secondary | ICD-10-CM | POA: Diagnosis not present

## 2017-02-21 DIAGNOSIS — Z880 Allergy status to penicillin: Secondary | ICD-10-CM | POA: Diagnosis not present

## 2017-02-21 DIAGNOSIS — Z8249 Family history of ischemic heart disease and other diseases of the circulatory system: Secondary | ICD-10-CM | POA: Insufficient documentation

## 2017-02-21 DIAGNOSIS — D376 Neoplasm of uncertain behavior of liver, gallbladder and bile ducts: Secondary | ICD-10-CM | POA: Diagnosis not present

## 2017-02-21 DIAGNOSIS — F17210 Nicotine dependence, cigarettes, uncomplicated: Secondary | ICD-10-CM | POA: Insufficient documentation

## 2017-02-21 DIAGNOSIS — Z720 Tobacco use: Secondary | ICD-10-CM

## 2017-02-21 DIAGNOSIS — C787 Secondary malignant neoplasm of liver and intrahepatic bile duct: Secondary | ICD-10-CM

## 2017-02-21 DIAGNOSIS — M5090 Cervical disc disorder, unspecified, unspecified cervical region: Secondary | ICD-10-CM | POA: Diagnosis not present

## 2017-02-21 DIAGNOSIS — G894 Chronic pain syndrome: Secondary | ICD-10-CM | POA: Diagnosis not present

## 2017-02-21 DIAGNOSIS — J449 Chronic obstructive pulmonary disease, unspecified: Secondary | ICD-10-CM | POA: Diagnosis not present

## 2017-02-21 DIAGNOSIS — M1990 Unspecified osteoarthritis, unspecified site: Secondary | ICD-10-CM | POA: Diagnosis not present

## 2017-02-21 MED ORDER — FUROSEMIDE 20 MG PO TABS: 20.0000 mg | ORAL_TABLET | Freq: Every day | ORAL | 5 refills | Status: AC

## 2017-02-21 MED ORDER — POTASSIUM CHLORIDE ER 10 MEQ PO TBCR: 10.0000 meq | EXTENDED_RELEASE_TABLET | Freq: Every day | ORAL | 5 refills | Status: AC

## 2017-02-21 NOTE — Progress Notes (Signed)
Patient ID: Rachel Butler, female    DOB: 17-Aug-1956, 61 y.o.   MRN: [id number]  HPI  Ms Ziesmer is a 61 y/o female with a history of migraines, COPD, cervical disc disease, anemia, arthritis, liver cancer, current tobacco use and chronic heart failure.   TEE done 01/24/17 which showed moderate MR with moderately thickened mitral valve leaflets. Echo was done 01/22/17 and showed an EF of 60-65% along with severe MR, moderate TR and elevated PA pressure of 56 mm Hg. Possible vegetation noted on anterior mitral leaflet. EF has improved from previous echo January 2016.  Admitted 01/18/17 with liver lesions found on an outpatient MRI. Had sepsis upon admission. Had a liver biopsy done and developed hypotension after acute blood loss after the biopsy. Received numerous PRBC's and had a hepatic artery embolization done. Cardiology consult obtained due to possible vegetation on mitral valve. Discharged home after 6 days.   She presents today for her initial visit with fatigue with little exertion. Has quite a bit of edema in both of her legs but she reports that it's slowly improving with the addition of furosemide. Denies being short of breath. Has palpitations with exertion. Wearing oxygen at 2L around the clock. Currently undergoing treatment for metastatic liver cancer.  Past Medical History:  Diagnosis Date  . Anemia   . Arthritis   . Blood transfusion without reported diagnosis   . Cancer ([place])   . Cervical disc disease   . COPD (chronic obstructive pulmonary disease) ([place])   . Intervertebral disk disease   . Lumbar disc disease   . Migraines   . Nicotine dependence   . UTI (urinary tract infection)    Past Surgical History:  Procedure Laterality Date  . ABDOMINAL HYSTERECTOMY  1986   right tube and ovary removed--then laparoscopy after for ?retained cyst?  . CERVICAL DISCECTOMY  1993   C5-6  . CESAREAN SECTION  [id number]  . COLONOSCOPY    . disk removal     c5c6  . IR GENERIC  HISTORICAL  01/18/2017   IR EMBO ART  VEN HEMORR LYMPH EXTRAV  INC GUIDE ROADMAPPING 01/18/2017 Greggory Keen, MD [facility]  . IR GENERIC HISTORICAL  01/18/2017   IR ANGIOGRAM SELECTIVE EACH ADDITIONAL VESSEL 01/18/2017 Greggory Keen, MD [facility]  . IR GENERIC HISTORICAL  01/18/2017   IR ANGIOGRAM SELECTIVE EACH ADDITIONAL VESSEL 01/18/2017 Greggory Keen, MD [facility]  . IR GENERIC HISTORICAL  01/18/2017   IR US GUIDE VASC ACCESS RIGHT 01/18/2017 Greggory Keen, MD [facility]  . IR GENERIC HISTORICAL  01/18/2017   IR ANGIOGRAM VISCERAL SELECTIVE 01/18/2017 Greggory Keen, MD [facility]  . IR GENERIC HISTORICAL  01/18/2017   IR ANGIOGRAM SELECTIVE EACH ADDITIONAL VESSEL 01/18/2017 Greggory Keen, MD [facility]  . IR GENERIC HISTORICAL  01/18/2017   IR ANGIOGRAM VISCERAL SELECTIVE 01/18/2017 Greggory Keen, MD [facility]  . IR GENERIC HISTORICAL  02/06/2017   IR FLUORO GUIDE PORT INSERTION RIGHT 02/06/2017 Marybelle Killings, MD [facility]  . TEE WITHOUT CARDIOVERSION N/A 01/24/2017   Procedure: TRANSESOPHAGEAL ECHOCARDIOGRAM (TEE)  WITH ANESTHESIA;  Surgeon: Sueanne Margarita, MD;  Location: [facility];  Service: Cardiovascular;  Laterality: N/A;  . [place]  . TUBAL LIGATION     Family History  Problem Relation Age of Onset  . Cancer Mother   . COPD Father   . Heart disease Father   . Hypertension Father   . Thyroid disease Other   . Cancer  Other     mother's side  . Heart disease Other     Dad's side  . Colon cancer Neg Hx    Social History  Substance Use Topics  . Smoking status: Current Every Day Smoker    Packs/day: 1.00    Years: 40.00    Types: Cigarettes  . Smokeless tobacco: Never Used  . Alcohol use No   Allergies  Allergen Reactions  . Indomethacin Other (See Comments)    Can not urinate  . Penicillins Rash  . Robaxin [Methocarbamol] Other (See Comments)    Urinary retention  . Nubain [Nalbuphine Hcl]    Prior to Admission  medications   Medication Sig Start Date End Date Taking? Authorizing Provider  BIOTIN 5000 PO Take by mouth daily.   Yes Historical Provider, MD  clotrimazole (MYCELEX) 10 MG troche Take 1 lozenge (10 mg total) by mouth 5 (five) times daily. 02/15/17  Yes Lequita Asal, MD  dexamethasone (DECADRON) 4 MG tablet Take 2 tablets (8 mg total) by mouth daily. Start the day after chemotherapy for 2 days. 02/04/17  Yes Sindy Guadeloupe, MD  fentaNYL (DURAGESIC - DOSED MCG/HR) 25 MCG/HR patch Place 25 mcg onto the skin every 3 (three) days.  12/13/14  Yes Historical Provider, MD  furosemide (LASIX) 20 MG tablet Take 1 tablet (20 mg total) by mouth daily. 02/21/17  Yes Alisa Graff, FNP  gabapentin (NEURONTIN) 800 MG tablet Take 1 tablet (800 mg total) by mouth 3 (three) times daily. 01/09/17  Yes Venia Carbon, MD  lidocaine-prilocaine (EMLA) cream Apply 1 application topically as needed. Apply small amount to port site at least 1 hour prior to it being accessed, cover with plastic wrap 01/31/17  Yes Sindy Guadeloupe, MD  lidocaine-prilocaine (EMLA) cream Apply to affected area once 02/04/17  Yes Sindy Guadeloupe, MD  LORazepam (ATIVAN) 0.5 MG tablet Take 1 tablet (0.5 mg total) by mouth every 6 (six) hours as needed (Nausea or vomiting). 02/04/17  Yes Sindy Guadeloupe, MD  Melatonin 2.5 MG CAPS Take 1 capsule by mouth at bedtime.   Yes Historical Provider, MD  metoprolol tartrate (LOPRESSOR) 25 MG tablet Take 0.5 tablets (12.5 mg total) by mouth 2 (two) times daily. 01/24/17  Yes Shanker Kristeen Mans, MD  Multiple Vitamins-Minerals (MULTIVITAMIN WITH MINERALS) tablet Take 1 tablet by mouth daily.   Yes Historical Provider, MD  ondansetron (ZOFRAN) 8 MG tablet Take 1 tablet (8 mg total) by mouth 2 (two) times daily as needed for refractory nausea / vomiting. Start on day 3 after chemo. 02/04/17  Yes Sindy Guadeloupe, MD  Oxycodone HCl 10 MG TABS Take 1 tablet (10 mg total) by mouth every 6 (six) hours as needed. 02/11/17  Yes  Lloyd Huger, MD  polyethylene glycol (MIRALAX / GLYCOLAX) packet Take 17 g by mouth daily. 01/23/17  Yes Florencia Reasons, MD  prochlorperazine (COMPAZINE) 10 MG tablet Take 1 tablet (10 mg total) by mouth every 6 (six) hours as needed (Nausea or vomiting). 02/04/17  Yes Sindy Guadeloupe, MD  promethazine (PHENERGAN) 25 MG tablet TAKE 1/2 TO 1 TABLETS BY MOUTH 3 TIMES DAILY AS NEEDED FOR NAUSEA 01/31/17  Yes Jearld Fenton, NP  sennosides-docusate sodium (SENOKOT-S) 8.6-50 MG tablet Take 2 tablets by mouth daily.   Yes Historical Provider, MD  tiZANidine (ZANAFLEX) 4 MG capsule Take 4 mg by mouth 3 (three) times daily. Takes 1 tid and up to 3 tablets at bedtime to sleep  Yes Historical Provider, MD  potassium chloride (K-DUR) 10 MEQ tablet Take 1 tablet (10 mEq total) by mouth daily. 02/21/17 05/22/17  Alisa Graff, FNP     Review of Systems  Constitutional: Positive for fatigue. Negative for appetite change.  HENT: Negative for congestion, postnasal drip and sore throat.   Eyes: Negative.   Respiratory: Negative for chest tightness.   Cardiovascular: Positive for palpitations (with exertion) and leg swelling (improving). Negative for chest pain.  Gastrointestinal: Positive for abdominal distention (when constipated), abdominal pain and constipation.  Endocrine: Negative.   Genitourinary: Negative.   Musculoskeletal: Negative for back pain and neck pain.  Skin: Negative.   Allergic/Immunologic: Negative.   Neurological: Negative for dizziness and light-headedness.  Hematological: Negative for adenopathy. Bruises/bleeds easily.  Psychiatric/Behavioral: Negative for dysphoric mood and sleep disturbance (sleeping on 2 pillows with oxygen at 2L). The patient is not nervous/anxious.    Vitals:   02/21/17 1140 02/21/17 1208  BP: (!) 65/47 110/70  Pulse: 77   Resp: 20   SpO2: 90%   Weight: 119 lb (54 kg)   Height: 5\' 3"  (1.6 m)    Wt Readings from Last 3 Encounters:  02/22/17 118 lb 12.8 oz  (53.9 kg)  02/21/17 119 lb (54 kg)  02/15/17 135 lb 2 oz (61.3 kg)   Lab Results  Component Value Date   CREATININE 0.90 02/22/2017   CREATININE 0.64 02/15/2017   CREATININE 0.45 02/12/2017   Physical Exam  Constitutional: She is oriented to person, place, and time. She appears well-developed and well-nourished.  HENT:  Head: Normocephalic and atraumatic.  Eyes: Conjunctivae are normal. Pupils are equal, round, and reactive to light.  Neck: Normal range of motion. Neck supple. No JVD present.  Cardiovascular: Normal rate and regular rhythm.   Pulmonary/Chest: Effort normal. She has no wheezes. She has no rales.  Abdominal: Soft. She exhibits distension. There is no tenderness.  Musculoskeletal: She exhibits edema (3+ pitting edema in bilateral lower legs up the thighs). She exhibits no tenderness.  Neurological: She is alert and oriented to person, place, and time.  Skin: Skin is warm and dry.  Psychiatric: She has a normal mood and affect. Her behavior is normal. Thought content normal.  Nursing note and vitals reviewed.   Assessment & Plan:  1: Chronic heart failure with preserved ejection fraction- - NYHA class III - fluid overloaded today with 3+ pitting edema up into her thighs although patient says that her legs are softer and not as heavy - has lost 16 pounds in the last 6 days. Begin weighing daily and call for an overnight weight gain of >2 pounds or a weekly weight gain of >5 pounds - continue with furosemide/potassium at this time. Could consider metolazone but concerned about hypotension.  - not adding salt to her food. Husband says that he's been reading food labels "occasionally". Encouraged them to closely follow a 2000mg  sodium diet - unsure of fluid intake and she was instructed that she needs to drink 40-50 ounces (~2L) of fluids daily.  - elevate legs - initial O2 level was 78% but her oxygen tank was empty. Placed on 2L in the office and she quickly rose to  90%  2: Hypotension- - BP quite low initially but oxygen tank was out - BP rechecked was 110/70 - hypotension will limit diuretic useage  3: Hypomagnesemia- - Mg on 02/15/17 was 1.1 with albumin 2.1 - receiving IV magnesium via the cancer center  4: Liver cancer- - being  followed closely at the cancer center - currently receiving weekly chemotherapy for 3 weeks with taking 1 week off before resuming  5: Tobacco use- - currently smoking 1 ppd of cigarettes and doesn't express any desire to quit at this time - complete cessation discussed for 2 minutes with her  Return here in 1 month or sooner for any questions/problems before then.

## 2017-02-21 NOTE — Telephone Encounter (Signed)
Spoke to [place]. She said it had to be a written order. I wrote up an order and stamped Dr Alla German name on it. Stacy felt that would be fine. I have faxed it to [facility].

## 2017-02-21 NOTE — Telephone Encounter (Signed)
This should have been done---like by Rollene Fare or other provider Please call with verbal order and can fax later

## 2017-02-21 NOTE — Patient Instructions (Signed)
Begin weighing daily and call for an overnight weight gain of > 2 pounds or a weekly weight gain of >5 pounds. 

## 2017-02-21 NOTE — Telephone Encounter (Signed)
Rachel Butler was recently placed on O2 and she is having dry nose and nose bleeds. She does not have on a humidifier on it, and they are requesting order faxed to [phone]. Advised Dr Silvio Pate out of office; per stacey, can wait until his return

## 2017-02-22 ENCOUNTER — Inpatient Hospital Stay: Payer: Medicare Other

## 2017-02-22 ENCOUNTER — Inpatient Hospital Stay (HOSPITAL_BASED_OUTPATIENT_CLINIC_OR_DEPARTMENT_OTHER): Payer: Medicare Other | Admitting: Oncology

## 2017-02-22 ENCOUNTER — Encounter: Payer: Self-pay | Admitting: Oncology

## 2017-02-22 VITALS — BP 111/73 | HR 91 | Temp 97.4°F | Resp 20 | Wt 118.8 lb

## 2017-02-22 DIAGNOSIS — D509 Iron deficiency anemia, unspecified: Secondary | ICD-10-CM

## 2017-02-22 DIAGNOSIS — C50919 Malignant neoplasm of unspecified site of unspecified female breast: Secondary | ICD-10-CM | POA: Diagnosis not present

## 2017-02-22 DIAGNOSIS — K869 Disease of pancreas, unspecified: Secondary | ICD-10-CM | POA: Diagnosis not present

## 2017-02-22 DIAGNOSIS — E8779 Other fluid overload: Secondary | ICD-10-CM

## 2017-02-22 DIAGNOSIS — I509 Heart failure, unspecified: Secondary | ICD-10-CM

## 2017-02-22 DIAGNOSIS — R188 Other ascites: Secondary | ICD-10-CM

## 2017-02-22 DIAGNOSIS — E871 Hypo-osmolality and hyponatremia: Secondary | ICD-10-CM

## 2017-02-22 DIAGNOSIS — Z5111 Encounter for antineoplastic chemotherapy: Secondary | ICD-10-CM | POA: Diagnosis not present

## 2017-02-22 DIAGNOSIS — C787 Secondary malignant neoplasm of liver and intrahepatic bile duct: Secondary | ICD-10-CM

## 2017-02-22 DIAGNOSIS — I959 Hypotension, unspecified: Secondary | ICD-10-CM | POA: Insufficient documentation

## 2017-02-22 DIAGNOSIS — G893 Neoplasm related pain (acute) (chronic): Secondary | ICD-10-CM

## 2017-02-22 DIAGNOSIS — Z720 Tobacco use: Secondary | ICD-10-CM | POA: Insufficient documentation

## 2017-02-22 DIAGNOSIS — I272 Pulmonary hypertension, unspecified: Secondary | ICD-10-CM

## 2017-02-22 LAB — CBC WITH DIFFERENTIAL/PLATELET
Basophils Absolute: 0.1 10*3/uL (ref 0–0.1)
Basophils Relative: 1 %
Eosinophils Absolute: 0 10*3/uL (ref 0–0.7)
Eosinophils Relative: 0 %
HCT: 28.7 % — ABNORMAL LOW (ref 35.0–47.0)
Hemoglobin: 9.4 g/dL — ABNORMAL LOW (ref 12.0–16.0)
Lymphocytes Relative: 7 %
Lymphs Abs: 0.8 10*3/uL — ABNORMAL LOW (ref 1.0–3.6)
MCH: 27.5 pg (ref 26.0–34.0)
MCHC: 32.6 g/dL (ref 32.0–36.0)
MCV: 84.3 fL (ref 80.0–100.0)
Monocytes Absolute: 0.7 10*3/uL (ref 0.2–0.9)
Monocytes Relative: 6 %
Neutro Abs: 10.4 10*3/uL — ABNORMAL HIGH (ref 1.4–6.5)
Neutrophils Relative %: 86 %
Platelets: 287 10*3/uL (ref 150–440)
RBC: 3.41 MIL/uL — ABNORMAL LOW (ref 3.80–5.20)
RDW: 23.8 % — ABNORMAL HIGH (ref 11.5–14.5)
WBC: 12 10*3/uL — ABNORMAL HIGH (ref 3.6–11.0)

## 2017-02-22 LAB — COMPREHENSIVE METABOLIC PANEL
ALT: 20 U/L (ref 14–54)
AST: 43 U/L — ABNORMAL HIGH (ref 15–41)
Albumin: 1.9 g/dL — ABNORMAL LOW (ref 3.5–5.0)
Alkaline Phosphatase: 212 U/L — ABNORMAL HIGH (ref 38–126)
Anion gap: 9 (ref 5–15)
BUN: 16 mg/dL (ref 6–20)
CO2: 31 mmol/L (ref 22–32)
Calcium: 7.8 mg/dL — ABNORMAL LOW (ref 8.9–10.3)
Chloride: 86 mmol/L — ABNORMAL LOW (ref 101–111)
Creatinine, Ser: 0.9 mg/dL (ref 0.44–1.00)
GFR calc Af Amer: >60 mL/min (ref >60)
GFR calc non Af Amer: >60 mL/min (ref >60)
Glucose, Bld: 88 mg/dL (ref 65–99)
Potassium: 3.9 mmol/L (ref 3.5–5.1)
Sodium: 126 mmol/L — ABNORMAL LOW (ref 135–145)
Total Bilirubin: 0.9 mg/dL (ref 0.3–1.2)
Total Protein: 5.3 g/dL — ABNORMAL LOW (ref 6.5–8.1)

## 2017-02-22 LAB — MAGNESIUM: Magnesium: 1.2 mg/dL — ABNORMAL LOW (ref 1.7–2.4)

## 2017-02-22 MED ORDER — SODIUM CHLORIDE 0.9 % IV SOLN
160.0000 mg | Freq: Once | INTRAVENOUS | Status: AC
  Administered 2017-02-22: 160 mg via INTRAVENOUS
  Filled 2017-02-22: qty 16

## 2017-02-22 MED ORDER — MAGNESIUM SULFATE 4 GM/100ML IV SOLN: 4.0000 g | Freq: Once | INTRAVENOUS | 0 refills | Status: AC

## 2017-02-22 MED ORDER — SODIUM CHLORIDE 0.9 % IV SOLN
Freq: Once | INTRAVENOUS | Status: AC
  Administered 2017-02-22: 12:00:00 via INTRAVENOUS
  Filled 2017-02-22: qty 1000

## 2017-02-22 MED ORDER — FAMOTIDINE IN NACL 20-0.9 MG/50ML-% IV SOLN
20.0000 mg | Freq: Once | INTRAVENOUS | Status: AC
  Administered 2017-02-22: 20 mg via INTRAVENOUS
  Filled 2017-02-22: qty 50

## 2017-02-22 MED ORDER — SODIUM CHLORIDE 0.9 % IV SOLN
Freq: Once | INTRAVENOUS | Status: DC
  Filled 2017-02-22: qty 1000

## 2017-02-22 MED ORDER — OXYCODONE HCL 10 MG PO TABS: 10.0000 mg | ORAL_TABLET | ORAL | 0 refills | Status: DC | PRN

## 2017-02-22 MED ORDER — OXYCODONE HCL ER 10 MG PO T12A
10.0000 mg | EXTENDED_RELEASE_TABLET | Freq: Two times a day (BID) | ORAL | 0 refills | Status: DC
Start: 2017-02-22 — End: 2017-03-21

## 2017-02-22 MED ORDER — DEXAMETHASONE SODIUM PHOSPHATE 100 MG/10ML IJ SOLN
20.0000 mg | Freq: Once | INTRAMUSCULAR | Status: AC
  Administered 2017-02-22: 20 mg via INTRAVENOUS
  Filled 2017-02-22: qty 2

## 2017-02-22 MED ORDER — HEPARIN SOD (PORK) LOCK FLUSH 100 UNIT/ML IV SOLN
500.0000 [IU] | Freq: Once | INTRAVENOUS | Status: AC | PRN
  Administered 2017-02-22: 500 [IU]
  Filled 2017-02-22: qty 5

## 2017-02-22 MED ORDER — SODIUM CHLORIDE 0.9 % IV SOLN
510.0000 mg | Freq: Once | INTRAVENOUS | Status: AC
  Administered 2017-02-22: 510 mg via INTRAVENOUS
  Filled 2017-02-22: qty 17

## 2017-02-22 MED ORDER — SODIUM CHLORIDE 0.9 % IV SOLN
60.0000 mg/m2 | Freq: Once | INTRAVENOUS | Status: AC
  Administered 2017-02-22: 90 mg via INTRAVENOUS
  Filled 2017-02-22: qty 15

## 2017-02-22 MED ORDER — SODIUM CHLORIDE 0.9 % IV SOLN: 4.0000 g | Freq: Once | INTRAVENOUS | Status: DC

## 2017-02-22 MED ORDER — PALONOSETRON HCL INJECTION 0.25 MG/5ML
0.2500 mg | Freq: Once | INTRAVENOUS | Status: AC
  Administered 2017-02-22: 0.25 mg via INTRAVENOUS
  Filled 2017-02-22: qty 5

## 2017-02-22 MED ORDER — MAGNESIUM SULFATE 4 GM/100ML IV SOLN
4.0000 g | Freq: Once | INTRAVENOUS | Status: AC
  Administered 2017-02-22: 4 g via INTRAVENOUS
  Filled 2017-02-22: qty 100

## 2017-02-22 MED ORDER — OXYCODONE HCL ER 10 MG PO T12A: 10.0000 mg | EXTENDED_RELEASE_TABLET | Freq: Two times a day (BID) | ORAL | 0 refills | Status: DC

## 2017-02-22 MED ORDER — DIPHENHYDRAMINE HCL 50 MG/ML IJ SOLN
50.0000 mg | Freq: Once | INTRAMUSCULAR | Status: AC
  Administered 2017-02-22: 50 mg via INTRAVENOUS
  Filled 2017-02-22: qty 1

## 2017-02-22 NOTE — Progress Notes (Signed)
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  2. Ultrasound showed multiple gallstones. Multiple solid hepatic lesions area and the largest measures 9.7 cm. Pancreatic mass appears to be present. Pancreatic solid lesions noted most likely adenopathy. Portal vein is narrowed possibly from periportal adenopathy.  3. MRI of the abdomen on 01/15/2017 showed enlargement of the liver and contains multiple ill-defined heterogeneous and peripheral enhancing lesions worrisome for multifocal metastatic disease. The largest lesion is in the medial segment of the left lobe measuring 6 cm. Lesion within segment 8 measures 3.2 cm segment 5 lesion measures 3.1 cm. The gallbladder fundus extends into the large mass within the medial segment of the left lobe. No biliary dilation. No inflammation or mass in the pancreas. Enlarged portocaval and porta hepatic lymph nodes worrisome for metastatic adenopathy.  4. Patient underwent ultrasound-guided liver biopsy which showed metastatic adenocarcinoma. Malignant cells are positive for CK 7 and GATA 3 as well as focal positive for estrogen. ER 40% positive PR negative. Cells are negative for CK 20, CDX2, TTF1, PAX8, napsin A, p53, WT 1 This is favored to represent breast primary  5. Post  liver biopsy patient had acute bleeding for about 2 hours and underwent Gelfoam embolization of the left hepatic artery. CTA of the abdomen did not reveal any acute or active intra-abdominal bleeding. Stable diffuse hepatic metastatic process. Stable perihepatic ascites and hemoperitoneum related to recent left hepatic mass biopsy. Intact abdominal wall without rectus sheath hematoma or hemorrhage. Pelvic ascites.  6. CTA of the chest showed no pulmonary embolus. Centrilobular emphysema. Secretions versus small pretracheal nodules. Short-term follow-up CT or bronchoscopy would be useful for further evaluation.   7. There was also a concern for endocarditis which was ruled out with TEE.   8. PET/CT from 02/04/17 showed: IMPRESSION: 1. Hypermetabolic hepatic metastatic disease with hypermetabolic gastrohepatic ligament, porta hepatis and retroperitoneal lymph nodes. 2. Moderate ascites. 3. Small bilateral pleural effusions. 4. Aortic atherosclerosis (ICD10-170.0). Coronary artery calcification. 5. Cholelithiasis.  9. Mammogram showed no breast mass. MRI breast pending. MRI brain was negative for metastases  10. Patients case was discussed at tumor board and pathology from [facility] reviewed it for second opinion as well. IHC consistent with breast primary although no primary breast mass. Tissue of origin test sent for confirmation  Interval history- Patient feels "a little more tired" than last week. She is fatigued. Pain meds are helping with abdominal pain.  She is on room air, reporting that she left her oxygen at home. SpO2 initially showed 78%, then >90% on room air.  She denies shortness of breath. Her thrush has resolved with antifungal lozenges.  She has a wound on her "backside" and is scheduled for the wound clinic on 02/25/17. She reports the swelling in her abdomen and legs has improved. She reports that CHF clinic has her on lasix and potassium and told her to drink  40-50 oz fluids  each day.  She has lost 17 pounds in past week, and attributes it to "water weight".  She reports itchy skin and attributes it to her clothing.  She is unable to perform her activities of daily living (ADLs), and is assisted by her husband.  ECOG PS- 3 Pain scale- 0 Opioid associated constipation- no  Review of systems- Review of Systems  Constitutional: Positive for malaise/fatigue. Negative for chills, fever and weight loss.  HENT: Negative for congestion, ear discharge and nosebleeds.        Thrush on tongue, mucous membranes  Eyes: Negative for blurred vision.  Respiratory: Positive for cough. Negative for hemoptysis, sputum production, shortness of breath and wheezing.        Cough at night with O2  Cardiovascular: Positive for leg swelling. Negative for chest pain, palpitations, orthopnea and claudication.  Gastrointestinal: Positive for abdominal pain and nausea. Negative for blood in stool, constipation, diarrhea, heartburn, melena and vomiting.       Nausea controlled with prn compazine and zofran  Genitourinary: Negative for dysuria, flank pain, frequency, hematuria and urgency.  Musculoskeletal: Negative for back pain, joint pain and myalgias.  Skin: Positive for itching.       Sacral soreness  Neurological: Positive for tingling and weakness. Negative for dizziness, focal weakness, seizures and headaches.       Chronic tingling in LUE x29yrs  Endo/Heme/Allergies: Does not bruise/bleed easily.  Psychiatric/Behavioral: Negative for depression and suicidal ideas. The patient does not have insomnia.      Current treatment- weekly carboplatin and Taxol  Allergies  Allergen Reactions  . Indomethacin Other (See Comments)    Can not urinate  . Penicillins Rash  . Robaxin [Methocarbamol] Other (See Comments)    Urinary retention  . Nubain [Nalbuphine Hcl]      Past Medical History:  Diagnosis Date  . Anemia   . Arthritis   . Blood transfusion without reported diagnosis     . Cancer (HCC)   . Cervical disc disease   . COPD (chronic obstructive pulmonary disease) (HCC)   . Intervertebral disk disease   . Lumbar disc disease   . Migraines   . Nicotine dependence   . UTI (urinary tract infection)      Past Surgical History:  Procedure Laterality Date  . ABDOMINAL HYSTERECTOMY  1986   right tube and ovary removed--then laparoscopy after for ?retained cyst?  . CERVICAL DISCECTOMY  1993   C5-6  . CESAREAN SECTION  [id number]  . COLONOSCOPY    . disk removal     [id number]  . IR GENERIC HISTORICAL  01/18/2017   IR EMBO ART  VEN HEMORR LYMPH EXTRAV  INC GUIDE ROADMAPPING 01/18/2017 Berdine Dance, MD [facility]  . IR GENERIC HISTORICAL  01/18/2017   IR ANGIOGRAM SELECTIVE EACH ADDITIONAL VESSEL 01/18/2017 Berdine Dance, MD [facility]  . IR GENERIC HISTORICAL  01/18/2017   IR ANGIOGRAM SELECTIVE EACH ADDITIONAL VESSEL 01/18/2017 Berdine Dance, MD [facility]  . IR GENERIC HISTORICAL  01/18/2017   IR US GUIDE VASC ACCESS RIGHT 01/18/2017 Berdine Dance, MD [facility]  . IR GENERIC HISTORICAL  01/18/2017   IR ANGIOGRAM VISCERAL SELECTIVE 01/18/2017 Berdine Dance, MD [facility]  . IR GENERIC HISTORICAL  01/18/2017   IR ANGIOGRAM SELECTIVE EACH ADDITIONAL VESSEL 01/18/2017 Berdine Dance, MD [facility]  . IR GENERIC HISTORICAL  01/18/2017   IR ANGIOGRAM VISCERAL SELECTIVE 01/18/2017 Berdine Dance, MD [facility]  . IR GENERIC HISTORICAL  02/06/2017  IR FLUORO GUIDE PORT INSERTION RIGHT 02/06/2017 Marybelle Killings, MD [facility]INTER[facility]  . TEE WITHOUT CARDIOVERSION N/A 01/24/2017   Procedure: TRANSESOPHAGEAL ECHOCARDIOGRAM (TEE)  WITH ANESTHESIA;  Surgeon: Sueanne Margarita, MD;  Location: [place];  Service: Cardiovascular;  Laterality: N/A;  . [place]  . TUBAL LIGATION      Social History   Social History  . Marital status: Married    Spouse name: N/A  . Number of children: 2  . Years of education: N/A   Occupational History  .  Social research officer, government ---retired   . 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 Smoking status: Current Every Day Smoker    Packs/day: 1.00    Years: 40.00    Types: Cigarettes  . Smokeless tobacco: Never Used  . Alcohol use No  . Drug use: No  . Sexual activity: Not on file   Other Topics Concern  . Not on file   Social History Narrative   [name] daughter      Has living will   Husband is health care POA   Would accept resuscitation but no prolonged ventilation   No tube feeds if cognitively unaware    Family History  Problem Relation Age of Onset  . Cancer Mother   . COPD Father   . Heart disease Father   . Hypertension Father   . Thyroid disease Other   . Cancer Other     mother's side  . Heart disease Other     Dad's side  . Colon cancer Neg Hx      Current Outpatient Prescriptions:  .  BIOTIN 5000 PO, Take by mouth daily., Disp: , Rfl:  .  clotrimazole (MYCELEX) 10 MG troche, Take 1 lozenge (10 mg total) by mouth 5 (five) times daily., Disp: 50 lozenge, Rfl: 0 .  dexamethasone (DECADRON) 4 MG tablet, Take 2 tablets (8 mg total) by mouth daily. Start the day after chemotherapy for 2 days., Disp: 30 tablet, Rfl: 1 .  fentaNYL (DURAGESIC - DOSED MCG/HR) 25 MCG/HR patch, Place 25 mcg onto the skin every 3 (three) days. , Disp: , Rfl: 0 .  furosemide (LASIX) 20 MG tablet, Take 1 tablet (20 mg total) by mouth daily., Disp: 30 tablet, Rfl: 5 .  gabapentin (NEURONTIN) 800 MG tablet, Take 1 tablet (800 mg total) by mouth 3 (three) times daily., Disp: 90 tablet, Rfl: 11 .  lidocaine-prilocaine (EMLA) cream, Apply 1 application topically as needed. Apply small amount to port site at least 1 hour prior to it being accessed, cover with plastic wrap, Disp: 30 g, Rfl: 1 .  lidocaine-prilocaine (EMLA) cream, Apply to affected area once, Disp: 30 g, Rfl: 3 .  LORazepam (ATIVAN) 0.5 MG tablet, Take 1 tablet (0.5 mg total) by mouth every 6 (six) hours as needed (Nausea  or vomiting)., Disp: 30 tablet, Rfl: 0 .  Melatonin 2.5 MG CAPS, Take 1 capsule by mouth at bedtime., Disp: , Rfl:  .  metoprolol tartrate (LOPRESSOR) 25 MG tablet, Take 0.5 tablets (12.5 mg total) by mouth 2 (two) times daily., Disp: 60 tablet, Rfl: 0 .  Multiple Vitamins-Minerals (MULTIVITAMIN WITH MINERALS) tablet, Take 1 tablet by mouth daily., Disp: , Rfl:  .  ondansetron (ZOFRAN) 8 MG tablet, Take 1 tablet (8 mg total) by mouth 2 (two) times daily as needed for refractory nausea / vomiting. Start on day 3 after chemo., Disp: 30 tablet, Rfl:  1 .  Oxycodone HCl 10 MG TABS, Take 1 tablet (10 mg total) by mouth every 6 (six) hours as needed., Disp: 56 tablet, Rfl: 0 .  polyethylene glycol (MIRALAX / GLYCOLAX) packet, Take 17 g by mouth daily., Disp: 14 each, Rfl: 0 .  potassium chloride (K-DUR) 10 MEQ tablet, Take 1 tablet (10 mEq total) by mouth daily., Disp: 30 tablet, Rfl: 5 .  prochlorperazine (COMPAZINE) 10 MG tablet, Take 1 tablet (10 mg total) by mouth every 6 (six) hours as needed (Nausea or vomiting)., Disp: 30 tablet, Rfl: 1 .  promethazine (PHENERGAN) 25 MG tablet, TAKE 1/2 TO 1 TABLETS BY MOUTH 3 TIMES DAILY AS NEEDED FOR NAUSEA, Disp: 30 tablet, Rfl: 0 .  sennosides-docusate sodium (SENOKOT-S) 8.6-50 MG tablet, Take 2 tablets by mouth daily., Disp: , Rfl:  .  tiZANidine (ZANAFLEX) 4 MG capsule, Take 4 mg by mouth 3 (three) times daily. Takes 1 tid and up to 3 tablets at bedtime to sleep, Disp: , Rfl:   Physical exam:  Vitals:   02/22/17 1107  BP: 111/73  Pulse: 91  Resp: 20  Temp: 97.4 F (36.3 C)  TempSrc: Tympanic  Weight: 118 lb 12.8 oz (53.9 kg)   Physical Exam  Constitutional: She is oriented to person, place, and time.  Fatigued, cachectic, sitting in a wheelchair in no acute distress  HENT:  Head: Normocephalic and atraumatic.  Tongue coated (thrush)  Eyes: EOM are normal. Pupils are equal, round, and reactive to light.  Neck: Normal range of motion.    Cardiovascular: Normal rate, regular rhythm and normal heart sounds.   Pulmonary/Chest: Effort normal and breath sounds normal.  On 2L O2  Abdominal: Soft. Bowel sounds are normal.  Palpable hepatomegaly.  Ascites.  Musculoskeletal: She exhibits edema.  3+ pitting edema in BLE  Neurological: She is alert and oriented to person, place, and time.  Skin: Skin is warm and dry. No erythema.  Sacral stage II decubitus ulcer.     CMP Latest Ref Rng & Units 02/22/2017  Glucose 65 - 99 mg/dL 88  BUN 6 - 20 mg/dL 16  Creatinine 0.44 - 1.00 mg/dL 0.90  Sodium 135 - 145 mmol/L 126(L)  Potassium 3.5 - 5.1 mmol/L 3.9  Chloride 101 - 111 mmol/L 86(L)  CO2 22 - 32 mmol/L 31  Calcium 8.9 - 10.3 mg/dL 7.8(L)  Total Protein 6.5 - 8.1 g/dL 5.3(L)  Total Bilirubin 0.3 - 1.2 mg/dL 0.9  Alkaline Phos 38 - 126 U/L 212(H)  AST 15 - 41 U/L 43(H)  ALT 14 - 54 U/L 20   CBC Latest Ref Rng & Units 02/22/2017  WBC 3.6 - 11.0 K/uL 12.0(H)  Hemoglobin 12.0 - 16.0 g/dL 9.4(L)  Hematocrit 35.0 - 47.0 % 28.7(L)  Platelets 150 - 440 K/uL 287    No images are attached to the encounter.  Mr Jeri Cos Wo Contrast  Result Date: 02/05/2017 CLINICAL DATA:  Breast cancer with liver metastases. EXAM: MRI HEAD WITHOUT AND WITH CONTRAST TECHNIQUE: Multiplanar, multiecho pulse sequences of the brain and surrounding structures were obtained without and with intravenous contrast. CONTRAST:  57m MULTIHANCE GADOBENATE DIMEGLUMINE 529 MG/ML IV SOLN COMPARISON:  PET CT 02/04/2017 FINDINGS: Brain: No focal diffusion restriction to indicate acute infarct. No intraparenchymal hemorrhage. There is minimal multifocal hyperintense T2-weighted signal within the periventricular white matter, which may be seen in the setting of migraine headaches or early chronic microvascular disease; however, it is also seen in normal patients of this age. No  mass lesion or midline shift. No hydrocephalus or extra-axial fluid collection. The midline  structures are normal. No age advanced or lobar predominant atrophy. Vascular: Major intracranial arterial and venous sinus flow voids are preserved. No evidence of chronic microhemorrhage or amyloid angiopathy. Skull and upper cervical spine: The visualized skull base, calvarium, upper cervical spine and extracranial soft tissues are normal. Sinuses/Orbits: No fluid levels or advanced mucosal thickening. No mastoid effusion. Normal orbits. IMPRESSION: No intracranial metastatic disease. Electronically Signed   By: Ulyses Jarred M.D.   On: 02/05/2017 14:44   Nm Pet Image Initial (pi) Skull Base To Thigh  Result Date: 02/04/2017 CLINICAL DATA:  Initial treatment strategy for breast cancer with liver metastases. EXAM: NUCLEAR MEDICINE PET SKULL BASE TO THIGH TECHNIQUE: 12.6 mCi F-18 FDG was injected intravenously. Full-ring PET imaging was performed from the skull base to thigh after the radiotracer. CT data was obtained and used for attenuation correction and anatomic localization. FASTING BLOOD GLUCOSE:  Value: 96 mg/dl COMPARISON:  CT chest 01/22/2017, CT abdomen pelvis 01/20/2017 and MR abdomen 01/15/2017. FINDINGS: NECK No hypermetabolic lymph nodes in the neck. CT images show no acute findings. CHEST Low left internal jugular lymph node measures 9 mm (CT image 59) with an SUV max of 2.5. No additional hypermetabolic mediastinal, hilar or axillary lymph nodes. No hypermetabolic pulmonary nodules. Atherosclerotic calcification of the arterial vasculature, including coronary arteries. No pericardial effusion. Small bilateral pleural effusions. ABDOMEN/PELVIS Hypermetabolic lesions in the liver measure up to approximately 7.0 cm in the inferior right hepatic lobe, with an SUV max of 9.4. No abnormal hypermetabolism in the adrenal glands, spleen or pancreas. Hypermetabolic gastrohepatic ligament lymph nodes measure up to approximately 10 mm with an SUV max of 3.2. Hypermetabolic porta hepatis adenopathy is  difficult to measure without IV contrast but has an SUV max 6.9. Hypermetabolic left periaortic lymph node measures 8 mm (CT image 160) with an SUV max of 5.6. Stones are seen in the gallbladder. Adrenal glands, kidneys, spleen, pancreas, stomach and bowel are grossly unremarkable. Moderate ascites. SKELETON No abnormal osseous hypermetabolism. IMPRESSION: 1. Hypermetabolic hepatic metastatic disease with hypermetabolic gastrohepatic ligament, porta hepatis and retroperitoneal lymph nodes. 2. Moderate ascites. 3. Small bilateral pleural effusions. 4. Aortic atherosclerosis (ICD10-170.0). Coronary artery calcification. 5. Cholelithiasis. Electronically Signed   By: Lorin Picket M.D.   On: 02/04/2017 10:20   US Venous Img Lower Bilateral  Result Date: 02/11/2017 CLINICAL DATA:  Lower extremity edema.  Metastatic breast carcinoma EXAM: BILATERAL LOWER EXTREMITY VENOUS DUPLEX ULTRASOUND TECHNIQUE: Gray-scale sonography with graded compression, as well as color Doppler and duplex ultrasound were performed to evaluate the lower extremity deep venous systems from the level of the common femoral vein and including the common femoral, femoral, profunda femoral, popliteal and calf veins including the posterior tibial, peroneal and gastrocnemius veins when visible. The superficial great saphenous vein was also interrogated. Spectral Doppler was utilized to evaluate flow at rest and with distal augmentation maneuvers in the common femoral, femoral and popliteal veins. COMPARISON:  None. FINDINGS: RIGHT LOWER EXTREMITY Common Femoral Vein: No evidence of thrombus. Normal compressibility, respiratory phasicity and response to augmentation. Saphenofemoral Junction: No evidence of thrombus. Normal compressibility and flow on color Doppler imaging. Profunda Femoral Vein: No evidence of thrombus. Normal compressibility and flow on color Doppler imaging. Femoral Vein: No evidence of thrombus. Normal compressibility, respiratory  phasicity and response to augmentation. Popliteal Vein: No evidence of thrombus. Normal compressibility, respiratory phasicity and response to augmentation. Calf Veins: No evidence of  thrombus in the areas visualized. Visualization less than optimal due to lower extremity edema. Normal compressibility and flow on color Doppler imaging. Superficial Great Saphenous Vein: No evidence of thrombus. Normal compressibility and flow on color Doppler imaging. Venous Reflux:  None. Other Findings:  Extensive lower extremity edema. LEFT LOWER EXTREMITY Common Femoral Vein: No evidence of thrombus. Normal compressibility, respiratory phasicity and response to augmentation. Saphenofemoral Junction: No evidence of thrombus. Normal compressibility and flow on color Doppler imaging. Profunda Femoral Vein: No evidence of thrombus. Normal compressibility and flow on color Doppler imaging. Femoral Vein: No evidence of thrombus. Normal compressibility, respiratory phasicity and response to augmentation. Popliteal Vein: No evidence of thrombus. Normal compressibility, respiratory phasicity and response to augmentation. Calf Veins: No evidence of thrombus in visualized regions. Visualization less than optimal due to lower extremity edema. Normal compressibility and flow on color Doppler imaging. Superficial Great Saphenous Vein: No evidence of thrombus. Normal compressibility and flow on color Doppler imaging. Venous Reflux:  None. Other Findings:  Extensive lower extremity edema. IMPRESSION: No evidence of deep venous thrombosis in either lower extremity. Calf vein visualization less than optimal due to extensive lower extremity edema bilaterally. Electronically Signed   By: Lowella Grip III M.D.   On: 02/11/2017 12:12   US Breast Ltd Uni Left Inc Axilla  Addendum Date: 02/11/2017   ADDENDUM REPORT: 02/11/2017 07:47 ADDENDUM: There is a dictation error in the report. Recommendation should read "Given patient's history of a  LIVER BIOPSY demonstrating metastatic breast cancer, I recommend breast MRI to evaluate for occult breast cancer. Electronically Signed   By: Franki Cabot M.D.   On: 02/11/2017 07:47   Result Date: 02/11/2017 CLINICAL DATA:  Status post liver biopsy demonstrating metastatic breast cancer. This is patient's baseline mammogram. EXAM: 2D DIGITAL DIAGNOSTIC BILATERAL MAMMOGRAM WITH CAD AND ADJUNCT TOMO ULTRASOUND LEFT BREAST COMPARISON:  None. ACR Breast Density Category c: The breast tissue is heterogeneously dense, which may obscure small masses. FINDINGS: Bilateral 2D CC and MLO projections were obtained, with additional 3D tomosynthesis. There are no dominant masses, suspicious calcifications or secondary signs of malignancy within either breast. There is an asymmetry within the lower left breast, compatible with superimposition of normal dense fibroglandular tissues on 3D tomosynthesis images, for which ultrasound will be performed to ensure benignity. Mammographic images were processed with CAD. Targeted ultrasound is performed, evaluating the lower outer quadrant of the left breast, showing only normal fibroglandular tissues and fat lobules. There is a ridge of normal dense fibroglandular tissue within the lower outer quadrant of the left breast corresponding to the mammographic finding. IMPRESSION: No evidence of malignancy within either breast. RECOMMENDATION: Given patient's history of a left breast biopsy demonstrating metastatic breast cancer, I recommend breast MRI to evaluate for occult breast cancer. These results and recommendations were called by telephone at the time of interpretation on 02/01/2017 at 12:25 pm to Dr. Astrid Divine RAO , who verbally acknowledged these results. I have discussed the findings and recommendations with the patient. Results were also provided in writing at the conclusion of the visit. If applicable, a reminder letter will be sent to the patient regarding the next appointment.  BI-RADS CATEGORY  1: Negative. Electronically Signed: By: Franki Cabot M.D. On: 02/01/2017 13:29   Mm Diag Breast Tomo Bilateral  Addendum Date: 02/11/2017   ADDENDUM REPORT: 02/11/2017 07:47 ADDENDUM: There is a dictation error in the report. Recommendation should read "Given patient's history of a LIVER BIOPSY demonstrating metastatic breast cancer, I recommend breast  MRI to evaluate for occult breast cancer. Electronically Signed   By: Franki Cabot M.D.   On: 02/11/2017 07:47   Result Date: 02/11/2017 CLINICAL DATA:  Status post liver biopsy demonstrating metastatic breast cancer. This is patient's baseline mammogram. EXAM: 2D DIGITAL DIAGNOSTIC BILATERAL MAMMOGRAM WITH CAD AND ADJUNCT TOMO ULTRASOUND LEFT BREAST COMPARISON:  None. ACR Breast Density Category c: The breast tissue is heterogeneously dense, which may obscure small masses. FINDINGS: Bilateral 2D CC and MLO projections were obtained, with additional 3D tomosynthesis. There are no dominant masses, suspicious calcifications or secondary signs of malignancy within either breast. There is an asymmetry within the lower left breast, compatible with superimposition of normal dense fibroglandular tissues on 3D tomosynthesis images, for which ultrasound will be performed to ensure benignity. Mammographic images were processed with CAD. Targeted ultrasound is performed, evaluating the lower outer quadrant of the left breast, showing only normal fibroglandular tissues and fat lobules. There is a ridge of normal dense fibroglandular tissue within the lower outer quadrant of the left breast corresponding to the mammographic finding. IMPRESSION: No evidence of malignancy within either breast. RECOMMENDATION: Given patient's history of a left breast biopsy demonstrating metastatic breast cancer, I recommend breast MRI to evaluate for occult breast cancer. These results and recommendations were called by telephone at the time of interpretation on 02/01/2017 at  12:25 pm to Dr. Astrid Divine RAO , who verbally acknowledged these results. I have discussed the findings and recommendations with the patient. Results were also provided in writing at the conclusion of the visit. If applicable, a reminder letter will be sent to the patient regarding the next appointment. BI-RADS CATEGORY  1: Negative. Electronically Signed: By: Franki Cabot M.D. On: 02/01/2017 13:29   Ir Fluoro Guide Port Insertion Right  Result Date: 02/06/2017 CLINICAL DATA:  pt with new dx of metastatic breast cancer with mets to liver EXAM: TUNNEL POWER PORT PLACEMENT WITH SUBCUTANEOUS POCKET UTILIZING ULTRASOUND & FLOUROSCOPY FLUOROSCOPY TIME:  18 seconds.  Two mGy. MEDICATIONS AND MEDICAL HISTORY: Versed 2 mg, Fentanyl 75 mcg. Additional Medications: None. Antibiotics were given within 2 hours of the procedure. ANESTHESIA/SEDATION: Moderate sedation time: 32 minutes. Nursing monitored the the patient during the procedure. PROCEDURE: After written informed consent was obtained, patient was placed in the supine position on angiographic table. The right neck and chest was prepped and draped in a sterile fashion. Lidocaine was utilized for local anesthesia. The right jugular vein was noted to be patent initially with ultrasound. Under sonographic guidance, a micropuncture needle was inserted into the right IJ vein (Ultrasound and fluoroscopic image documentation was performed). The needle was removed over an 018 wire which was exchanged for a Amplatz. This was advanced into the IVC. An 8-French dilator was advanced over the Amplatz. A small incision was made in the right upper chest over the anterior right second rib. Utilizing blunt dissection, a subcutaneous pocket was created in the caudal direction. The pocket was irrigated with a copious amount of sterile normal saline. The port catheter was tunneled from the chest incision, and out the neck incision. The reservoir was inserted into the subcutaneous pocket  and secured with two 3-0 Ethilon stitches. A peel-away sheath was advanced over the Amplatz wire. The port catheter was cut to measure length and inserted through the peel-away sheath. The peel-away sheath was removed. The chest incision was closed with 3-0 Vicryl interrupted stitches for the subcutaneous tissue and a running of 4-0 Vicryl subcuticular stitch for the skin. The neck incision  was closed with a 4-0 Vicryl subcuticular stitch. Derma-bond was applied to both surgical incisions. The port reservoir was flushed and instilled with heparinized saline. No complications. FINDINGS: A right IJ vein Port-A-Cath is in place with its tip at the cavoatrial junction. COMPLICATIONS: None IMPRESSION: Successful 8 French right internal jugular vein power port placement with its tip at the SVC/RA junction. Electronically Signed   By: Marybelle Killings M.D.   On: 02/06/2017 13:20     Assessment and plan- Patient is a 61 y.o. 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  1. biotheranostics tests suggests SCC probably from cervix versus Head/ neck. No primary lesion noted on PET. Continue weekly carbo/taxol. Cycle 3 today. Next week will be off and she starts cycle 4 after 2 weeks  2.  Labs today: CBC with diff, CMP, Mg.  Review labs from 02/12/2017 (CBC with diff, CMP, Mg).  4. Week #3 carboplatin/taxol today.    5. Chronic hyponatremia and hypochloremia- this improved slightly after normal saline infusion. However, concerns for volume overload and worsening CHF.  No extra fluids today.  She was seen in CHF clinic on 02/21/17 and she is on lasix and potassium. Potassium has normalized.    6. Hypomagnesemia-  Magnesium is 1.2 today.  Infuse 4 gm magnesium today.  7. Anemia- ferritin high likely from malignancy. Serum iron has been low. B12 elevated. Folate and TSH  normal. Feraheme X 2nd dose today  8. Neoplasm related pain- controlled with medications.  Add Rx for oxycontin 68m po BID today.  Reorder Rx for oxycodone [id number] prn for breakthrough pain.  Pain ranging from 5-8/10.  9. Sacral decubitus ulcer - scheduled for wound clinic on 02/25/17 for assessment and management.  10. Congestive heart failure: BNP 1418. Bilateral lower extremity 3+ pitting edema. Patient had been taking Lasix, but previously not drinking enough and not urinating much. She has started drinking a bit more fluids. CHF clinic assessment on 02/21/17.   11.  Thrush:  Patient started on clotrimazole troches. Resolved.  12.  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  Treatment is palliative.  Patient wants to receive treatment and receive CPR, chest compressions, ventilation "up to a point".  She does not want to be kept alive if her situation is not reversible.  13.  RTC in 2 weeks for MD assessment, labs, and cycle # 4 of chemotherapy   Visit Diagnosis 1. 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 Liver metastases ([facility]  [facility]. Metastatic breast cancer ([facility]  4. Encounter for antineoplastic chemotherapy   5. Hyponatremia   6. Neoplasm related pain     I saw and evaluated the patient, participating in the key portions of the service and reviewing pertinent diagnostic studies and records.  I reviewed the nurse practitioner's note and agree with the findings and the plan.  The assessment and plan were discussed with the patient.  Multiple questions were asked by the patient and answered.    AnLucendia HerrlichNP 02/15/17 2:45 PM

## 2017-02-22 NOTE — Progress Notes (Signed)
Needs refill on oxycodone, no other issues to report.

## 2017-02-25 ENCOUNTER — Ambulatory Visit (INDEPENDENT_AMBULATORY_CARE_PROVIDER_SITE_OTHER): Payer: Medicare Other | Admitting: Internal Medicine

## 2017-02-25 ENCOUNTER — Encounter: Payer: Medicare Other | Attending: Surgery | Admitting: Surgery

## 2017-02-25 ENCOUNTER — Encounter: Payer: Self-pay | Admitting: Internal Medicine

## 2017-02-25 VITALS — BP 96/70 | HR 85 | Temp 98.5°F | Wt 122.0 lb

## 2017-02-25 DIAGNOSIS — I890 Lymphedema, not elsewhere classified: Secondary | ICD-10-CM | POA: Diagnosis not present

## 2017-02-25 DIAGNOSIS — G629 Polyneuropathy, unspecified: Secondary | ICD-10-CM | POA: Diagnosis not present

## 2017-02-25 DIAGNOSIS — Z880 Allergy status to penicillin: Secondary | ICD-10-CM | POA: Insufficient documentation

## 2017-02-25 DIAGNOSIS — F17218 Nicotine dependence, cigarettes, with other nicotine-induced disorders: Secondary | ICD-10-CM | POA: Insufficient documentation

## 2017-02-25 DIAGNOSIS — C787 Secondary malignant neoplasm of liver and intrahepatic bile duct: Secondary | ICD-10-CM | POA: Diagnosis not present

## 2017-02-25 DIAGNOSIS — L89312 Pressure ulcer of right buttock, stage 2: Secondary | ICD-10-CM | POA: Diagnosis not present

## 2017-02-25 DIAGNOSIS — I509 Heart failure, unspecified: Secondary | ICD-10-CM | POA: Diagnosis not present

## 2017-02-25 DIAGNOSIS — M1990 Unspecified osteoarthritis, unspecified site: Secondary | ICD-10-CM | POA: Insufficient documentation

## 2017-02-25 DIAGNOSIS — J449 Chronic obstructive pulmonary disease, unspecified: Secondary | ICD-10-CM | POA: Insufficient documentation

## 2017-02-25 DIAGNOSIS — D500 Iron deficiency anemia secondary to blood loss (chronic): Secondary | ICD-10-CM | POA: Insufficient documentation

## 2017-02-25 DIAGNOSIS — I110 Hypertensive heart disease with heart failure: Secondary | ICD-10-CM | POA: Insufficient documentation

## 2017-02-25 DIAGNOSIS — Z79899 Other long term (current) drug therapy: Secondary | ICD-10-CM | POA: Diagnosis not present

## 2017-02-25 DIAGNOSIS — C229 Malignant neoplasm of liver, not specified as primary or secondary: Secondary | ICD-10-CM | POA: Diagnosis not present

## 2017-02-25 DIAGNOSIS — Z9221 Personal history of antineoplastic chemotherapy: Secondary | ICD-10-CM | POA: Insufficient documentation

## 2017-02-25 DIAGNOSIS — E440 Moderate protein-calorie malnutrition: Secondary | ICD-10-CM | POA: Diagnosis not present

## 2017-02-25 DIAGNOSIS — L8943 Pressure ulcer of contiguous site of back, buttock and hip, stage 3: Secondary | ICD-10-CM | POA: Insufficient documentation

## 2017-02-25 DIAGNOSIS — L89152 Pressure ulcer of sacral region, stage 2: Secondary | ICD-10-CM | POA: Diagnosis not present

## 2017-02-25 DIAGNOSIS — I5032 Chronic diastolic (congestive) heart failure: Secondary | ICD-10-CM

## 2017-02-25 DIAGNOSIS — L89322 Pressure ulcer of left buttock, stage 2: Secondary | ICD-10-CM | POA: Diagnosis not present

## 2017-02-25 NOTE — Assessment & Plan Note (Signed)
Initial sign that this was from breast is now in question Is getting the chemo which should be appropriate for either apparently

## 2017-02-25 NOTE — Progress Notes (Signed)
DEMEISHA, GERAGHTY ([id number]) Visit Report for 02/25/2017 Abuse/Suicide Risk Screen Details Patient Name: Rachel Butler, Rachel Butler. Date of Service: 02/25/2017 10:30 AM Medical Record Number: [id number] Patient Account Number: [id number] Date of Birth/Sex: [birth year] (61 y.o. Female) Treating RN: Ahmed Prima Primary Care Eoghan Belcher: Viviana Simpler Other Clinician: Referring Garmon Dehn: Nolon Stalls Treating Shanterria Franta/Extender: Frann Rider in Treatment: 0 Abuse/Suicide Risk Screen Items Answer ABUSE/SUICIDE RISK SCREEN: Has anyone close to you tried to hurt or harm you recentlyo No Do you feel uncomfortable with anyone in your familyo No Has anyone forced you do things that you didnot want to doo No Do you have any thoughts of harming yourselfo No Patient displays signs or symptoms of abuse and/or neglect. No Electronic Signature(s) Signed: 02/25/2017 4:13:19 PM By: Alric Quan Entered By: Alric Quan on 02/25/2017 10:27:45 Rachel Butler ([id number]) -------------------------------------------------------------------------------- Activities of Daily Living Details Patient Name: Rachel Butler, Rachel Butler Date of Service: 02/25/2017 10:30 AM Medical Record Number: [id number] Patient Account Number: [id number] Date of Birth/Sex: May 28, 1956 (61 y.o. Female) Treating RN: Carolyne Fiscal, Debi Primary Care Danyah Guastella: Viviana Simpler Other Clinician: Referring Kaylin Marcon: Nolon Stalls Treating Twain Stenseth/Extender: Frann Rider in Treatment: 0 Activities of Daily Living Items Answer Activities of Daily Living (Please select one for each item) Drive Automobile Not Able Take Medications Need Assistance Use Telephone Completely Able Care for Appearance Need Assistance Use Toilet Need Assistance Bath / Shower Need Assistance Dress Self Need Assistance Feed Self Completely Able Walk Need Assistance Get In / Out Bed Need Assistance Housework Not Able Prepare Meals Not  Able Handle Money Not Able Shop for Self Not Able Electronic Signature(s) Signed: 02/25/2017 4:13:19 PM By: Alric Quan Entered By: Alric Quan on 02/25/2017 10:28:14 Rachel Butler ([id number]) -------------------------------------------------------------------------------- Education Assessment Details Patient Name: Rachel Butler Date of Service: 02/25/2017 10:30 AM Medical Record Number: [id number] Patient Account Number: [id number] Date of Birth/Sex: 09-26-1956 (61 y.o. Female) Treating RN: Carolyne Fiscal, Debi Primary Care Axle Parfait: Viviana Simpler Other Clinician: Referring Zoey Bidwell: Nolon Stalls Treating Carvel Huskins/Extender: Frann Rider in Treatment: 0 Primary Learner Assessed: Patient Learning Preferences/Education Level/Primary Language Learning Preference: Explanation, Printed Material Highest Education Level: College or Above Preferred Language: English Cognitive Barrier Assessment/Beliefs Language Barrier: No Translator Needed: No Memory Deficit: No Emotional Barrier: No Cultural/Religious Beliefs Affecting Medical No Care: Physical Barrier Assessment Impaired Vision: Yes Glasses Impaired Hearing: No Decreased Hand dexterity: No Knowledge/Comprehension Assessment Knowledge Level: Medium Comprehension Level: Medium Ability to understand written Medium instructions: Ability to understand verbal Medium instructions: Motivation Assessment Anxiety Level: Calm Cooperation: Cooperative Education Importance: Acknowledges Need Interest in Health Problems: Asks Questions Perception: Coherent Willingness to Engage in Self- Medium Management Activities: Readiness to Engage in Self- Medium Management Activities: Electronic Signature(s) Rachel Butler, Rachel Butler ([id number]) Signed: 02/25/2017 4:13:19 PM By: Alric Quan Entered By: Alric Quan on 02/25/2017 10:28:34 Rachel Butler  ([id number]) -------------------------------------------------------------------------------- Fall Risk Assessment Details Patient Name: Rachel Butler Date of Service: 02/25/2017 10:30 AM Medical Record Number: [id number] Patient Account Number: [id number] Date of Birth/Sex: 02/12/1956 (61 y.o. Female) Treating RN: Carolyne Fiscal, Debi Primary Care Ramaya Guile: Viviana Simpler Other Clinician: Referring Brayden Brodhead: Nolon Stalls Treating Roby Donaway/Extender: Frann Rider in Treatment: 0 Fall Risk Assessment Items Have you had 2 or more falls in the last 12 monthso 0 No Have you had any fall that resulted in injury in the last 12 monthso 0 No FALL RISK ASSESSMENT: History of falling - immediate or within 3 months 0 No Secondary diagnosis 15 Yes Ambulatory aid None/bed rest/wheelchair/nurse 0 Yes Crutches/cane/walker 15  Yes Furniture 0 No IV Access/Saline Lock 0 No Gait/Training Normal/bed rest/immobile 0 No Weak 10 Yes Impaired 20 Yes Mental Status Oriented to own ability 0 Yes Electronic Signature(s) Signed: 02/25/2017 4:13:19 PM By: Alric Quan Entered By: Alric Quan on 02/25/2017 10:29:08 Rachel Butler ([id number]) -------------------------------------------------------------------------------- Foot Assessment Details Patient Name: Rachel Butler Date of Service: 02/25/2017 10:30 AM Medical Record Number: [id number] Patient Account Number: [id number] Date of Birth/Sex: [birth year] (61 y.o. Female) Treating RN: Carolyne Fiscal, Debi Primary Care Ninette Cotta: Viviana Simpler Other Clinician: Referring Gualberto Wahlen: Nolon Stalls Treating Meer Reindl/Extender: Frann Rider in Treatment: 0 Foot Assessment Items Site Locations + = Sensation present, - = Sensation absent, C = Callus, U = Ulcer R = Redness, W = Warmth, M = Maceration, PU = Pre-ulcerative lesion F = Fissure, S = Swelling, D = Dryness Assessment Right: Left: Other Deformity: No No Prior Foot Ulcer:  No No Prior Amputation: No No Charcot Joint: No No Ambulatory Status: Gait: Electronic Signature(s) Signed: 02/25/2017 4:13:19 PM By: Alric Quan Entered By: Alric Quan on 02/25/2017 10:29:26 Rachel Butler ([id number]) -------------------------------------------------------------------------------- Nutrition Risk Assessment Details Patient Name: Rachel Butler Date of Service: 02/25/2017 10:30 AM Medical Record Number: [id number] Patient Account Number: [id number] Date of Birth/Sex: 1956/05/09 (61 y.o. Female) Treating RN: Carolyne Fiscal, Debi Primary Care Tena Linebaugh: Viviana Simpler Other Clinician: Referring Kayln Garceau: Nolon Stalls Treating Takenya Travaglini/Extender: Frann Rider in Treatment: 0 Height (in): 63 Weight (lbs): 119 Body Mass Index (BMI): 21.1 Nutrition Risk Assessment Items NUTRITION RISK SCREEN: I have an illness or condition that made me change the kind and/or 2 Yes amount of food I eat I eat fewer than two meals per day 0 No I eat few fruits and vegetables, or milk products 0 No I have three or more drinks of beer, liquor or wine almost every day 0 No I have tooth or mouth problems that make it hard for me to eat 0 No I don't always have enough money to buy the food I need 0 No I eat alone most of the time 0 No I take three or more different prescribed or over-the-counter drugs a 1 Yes day Without wanting to, I have lost or gained 10 pounds in the last six 0 No months I am not always physically able to shop, cook and/or feed myself 0 No Nutrition Protocols Good Risk Protocol Moderate Risk Protocol Electronic Signature(s) Signed: 02/25/2017 4:13:19 PM By: Alric Quan Entered By: Alric Quan on 02/25/2017 10:29:19

## 2017-02-25 NOTE — Assessment & Plan Note (Signed)
Sig fluid accumulation which is improved back on the furosemide

## 2017-02-25 NOTE — Assessment & Plan Note (Signed)
Cough and hypoxia Is working on cutting back on cigarettes

## 2017-02-25 NOTE — Progress Notes (Addendum)
TEKEYAH, SANTIAGO ([id number]) Visit Report for 02/25/2017 Chief Complaint Document Details Patient Name: Rachel Butler, Rachel Butler. Date of Service: 02/25/2017 10:30 AM Medical Record Number: [id number] Patient Account Number: [id number] Date of Birth/Sex: Sep 04, 1956 (61 y.o. Female) Treating RN: Carolyne Fiscal, Debi Primary Care Provider: Viviana Simpler Other Clinician: Referring Provider: Nolon Stalls Treating Provider/Extender: Frann Rider in Treatment: 0 Information Obtained from: Patient Chief Complaint Patient is at the clinic for treatment of an open pressure ulcer to the sacrum gluteal and coccygeal region for about 6 weeks Electronic Signature(s) Signed: 02/25/2017 11:08:44 AM By: Christin Fudge MD, FACS Entered By: Christin Fudge on 02/25/2017 11:08:44 Rachel Butler ([id number]) -------------------------------------------------------------------------------- HPI Details Patient Name: Rachel Butler Date of Service: 02/25/2017 10:30 AM Medical Record Number: [id number] Patient Account Number: [id number] Date of Birth/Sex: [birth year] (61 y.o. Female) Treating RN: Carolyne Fiscal, Debi Primary Care Provider: Viviana Simpler Other Clinician: Referring Provider: Nolon Stalls Treating Provider/Extender: Frann Rider in Treatment: 0 History of Present Illness Location: area of her back sacrum and buttocks Quality: Patient reports experiencing a sharp pain to affected area(s). Severity: Patient states wound are getting worse. Duration: Patient has had the wound for >2 months prior to seeking treatment at the wound center Timing: Pain in wound is constant (hurts all the time) Context: The wound appeared gradually over time Modifying Factors: Other treatment(s) tried include:local care as given by a home health Associated Signs and Symptoms: Patient reports having difficulty standing for long periods. HPI Description: 61 year old patient has been recently seen at her PCPs  office for chronic heart failure with lymphedema, hypertension, hypomagnesemia, liver cancer and tobacco abuse. Most recent ejection fraction done showed 60-65% along with severe MR, moderate TR and elevated PA pressures. Was recently admitted for sepsis to the hospital and developed hypotension after an liver biopsy. She had several blood transfusions and had hepatic artery embolization. Past medical history significant for anemia, arthritis, liver cancer, nicotine dependence, status post abdominal hysterectomy, cervical disc surgery, recent hospitalization with interventional radiology procedures done for problems with her liver and treatment of issues with this. She smokes a pack of cigarettes daily and has done this for 40 years. Electronic Signature(s) Signed: 02/25/2017 11:10:55 AM By: Christin Fudge MD, FACS Previous Signature: 02/25/2017 10:25:24 AM Version By: Christin Fudge MD, FACS Entered By: Christin Fudge on 02/25/2017 11:10:55 Rachel Butler ([id number]) -------------------------------------------------------------------------------- Physical Exam Details Patient Name: Rachel Butler, Rachel Butler Date of Service: 02/25/2017 10:30 AM Medical Record Number: [id number] Patient Account Number: [id number] Date of Birth/Sex: 08-01-1956 (61 y.o. Female) Treating RN: Carolyne Fiscal, Debi Primary Care Provider: Viviana Simpler Other Clinician: Referring Provider: Nolon Stalls Treating Provider/Extender: Frann Rider in Treatment: 0 Constitutional . Pulse regular. Respirations normal and unlabored. Afebrile. . Eyes Nonicteric. Reactive to light. Ears, Nose, Mouth, and Throat Lips, teeth, and gums WNL.Marland Kitchen Moist mucosa without lesions. Neck supple and nontender. No palpable supraclavicular or cervical adenopathy. Normal sized without goiter. Respiratory WNL. No retractions.. Cardiovascular Pedal Pulses WNL. No clubbing, cyanosis or edema. Gastrointestinal (GI) Abdomen without masses or  tenderness.. No liver or spleen enlargement or tenderness.. Lymphatic No adneopathy. No adenopathy. No adenopathy. Musculoskeletal Adexa without tenderness or enlargement.. Digits and nails w/o clubbing, cyanosis, infection, petechiae, ischemia, or inflammatory conditions.. Integumentary (Hair, Skin) No suspicious lesions. No crepitus or fluctuance. No peri-wound warmth or erythema. No masses.Marland Kitchen Psychiatric Judgement and insight Intact.. No evidence of depression, anxiety, or agitation.. Notes the patient has a stage III pressure injury to both gluteal areas sacrum and coccygeal regions and no  sharp debridement was required today. Electronic Signature(s) Signed: 02/25/2017 11:11:31 AM By: Christin Fudge MD, FACS Entered By: Christin Fudge on 02/25/2017 11:11:31 Rachel Butler ([id number]) -------------------------------------------------------------------------------- Physician Orders Details Patient Name: Rachel Butler Date of Service: 02/25/2017 10:30 AM Medical Record Number: [id number] Patient Account Number: [id number] Date of Birth/Sex: 1956-01-25 (61 y.o. Female) Treating RN: Carolyne Fiscal, Debi Primary Care Provider: Viviana Simpler Other Clinician: Referring Provider: Nolon Stalls Treating Provider/Extender: Frann Rider in Treatment: 0 Verbal / Phone Orders: Yes Clinician: Pinkerton, Debi Read Back and Verified: Yes Diagnosis Coding Wound Cleansing Wound #1 Right Gluteus o Clean wound with Normal Saline. Wound #2 Left Gluteus o Clean wound with Normal Saline. Wound #3 Midline Coccyx o Clean wound with Normal Saline. Anesthetic Wound #1 Right Gluteus o Topical Lidocaine 4% cream applied to wound bed prior to debridement - clinic use Wound #2 Left Gluteus o Topical Lidocaine 4% cream applied to wound bed prior to debridement - clinic use Wound #3 Midline Coccyx o Topical Lidocaine 4% cream applied to wound bed prior to debridement - clinic  use Skin Barriers/Peri-Wound Care Wound #1 Right Gluteus o Skin Prep Wound #2 Left Gluteus o Skin Prep Wound #3 Midline Coccyx o Skin Prep Primary Wound Dressing Wound #1 Right Gluteus o Aquacel Ag Wound #2 Left Gluteus o Aquacel Ag Rachel Butler, Rachel Butler ([id number]) Wound #3 Midline Coccyx o Aquacel Ag Secondary Dressing Wound #1 Right Gluteus o Boardered Foam Dressing Wound #2 Left Gluteus o Boardered Foam Dressing Wound #3 Midline Coccyx o Boardered Foam Dressing Dressing Change Frequency Wound #1 Right Gluteus o Change dressing every other day. Wound #2 Left Gluteus o Change dressing every other day. Wound #3 Midline Coccyx o Change dressing every other day. Follow-up Appointments Wound #1 Right Gluteus o Return Appointment in 1 week. Wound #2 Left Gluteus o Return Appointment in 1 week. Wound #3 Midline Coccyx o Return Appointment in 1 week. Off-Loading Wound #1 Right Gluteus o Roho cushion for wheelchair - HHRN to order. [place] to order. Mattress appropriate for stage 3 wounds. o Turn and reposition every 2 hours Wound #2 Left Gluteus o Roho cushion for wheelchair - HHRN to order. [place] to order. Mattress appropriate for stage 3 wounds. o Turn and reposition every 2 hours Wound #3 Midline Coccyx o Roho cushion for wheelchair - HHRN to order. [place] to order. Mattress appropriate for stage 3 wounds. Rachel Butler, Rachel Butler ([id number]) o Turn and reposition every 2 hours Additional Orders / Instructions Wound #1 Right Gluteus o Stop Smoking o Increase protein intake. Wound #2 Left Gluteus o Stop Smoking o Increase protein intake. Wound #3 Midline Coccyx o Stop Smoking o Increase protein intake. Home Health Wound #1 Right [employer] for [place] Nurse may visit PRN to address patientos wound care needs. o FACE TO  FACE ENCOUNTER: MEDICARE and MEDICAID PATIENTS: I certify that this patient is under my care and that I had a face-to-face encounter that meets the physician face-to-face encounter requirements with this patient on this date. The encounter with the patient was in whole or in part for the following MEDICAL CONDITION: (primary reason for [place]) MEDICAL NECESSITY: I certify, that based on my findings, NURSING services are a medically necessary home health service. HOME BOUND STATUS: I certify that my clinical findings support that this patient is homebound (i.e., Due to illness or injury, pt requires aid of supportive devices  such as crutches, cane, wheelchairs, walkers, the use of special transportation or the assistance of another person to leave their place of residence. There is a normal inability to leave the home and doing so requires considerable and taxing effort. Other absences are for medical reasons / religious services and are infrequent or of short duration when for other reasons). o If current dressing causes regression in wound condition, may D/C ordered dressing product/s and apply Normal Saline Moist Dressing daily until next [place] / Other MD appointment. [place] of regression in wound condition at [phone]. o Please direct any NON-WOUND related issues/requests for orders to patient's Primary Care Physician Wound #2 Left [place] for [place] Nurse may visit PRN to address patientos wound care needs. o FACE TO FACE ENCOUNTER: MEDICARE and MEDICAID PATIENTS: I certify that this patient is under my care and that I had a face-to-face encounter that meets the physician face-to-face encounter requirements with this patient on this date. The encounter with the patient was in whole or in part for the following MEDICAL CONDITION: (primary reason for [place]) MEDICAL  NECESSITY: I certify, that based on my findings, NURSING services are a medically necessary home health service. HOME BOUND STATUS: I certify that my clinical findings support that this patient is homebound (i.e., Due to illness or injury, pt requires aid of supportive devices such as crutches, cane, wheelchairs, walkers, the use of special transportation or the assistance of another person to leave their place of residence. There is a Rachel Butler, Rachel Butler. ([id number]) normal inability to leave the home and doing so requires considerable and taxing effort. Other absences are for medical reasons / religious services and are infrequent or of short duration when for other reasons). o If current dressing causes regression in wound condition, may D/C ordered dressing product/s and apply Normal Saline Moist Dressing daily until next [place] / Other MD appointment. [place] of regression in wound condition at [phone]. o Please direct any NON-WOUND related issues/requests for orders to patient's Primary Care Physician Wound #3 [place] for [place] Nurse may visit PRN to address patientos wound care needs. o FACE TO FACE ENCOUNTER: MEDICARE and MEDICAID PATIENTS: I certify that this patient is under my care and that I had a face-to-face encounter that meets the physician face-to-face encounter requirements with this patient on this date. The encounter with the patient was in whole or in part for the following MEDICAL CONDITION: (primary reason for [place]) MEDICAL NECESSITY: I certify, that based on my findings, NURSING services are a medically necessary home health service. HOME BOUND STATUS: I certify that my clinical findings support that this patient is homebound (i.e., Due to illness or injury, pt requires aid of supportive devices such as crutches, cane, wheelchairs, walkers, the use of  special transportation or the assistance of another person to leave their place of residence. There is a normal inability to leave the home and doing so requires considerable and taxing effort. Other absences are for medical reasons / religious services and are infrequent or of short duration when for other reasons). o If current dressing causes regression in wound condition, may D/C ordered dressing product/s and apply Normal Saline Moist Dressing daily until next [place] / Other MD appointment. [place] of regression in wound condition at [phone]. o Please direct  any NON-WOUND related issues/requests for orders to patient's Primary Care Physician Medications-please add to medication list. Wound #1 Right Gluteus o Other: - Vitamin C, Vitamin A, Zinc, MVI Wound #2 Left Gluteus o Other: - Vitamin C, Vitamin A, Zinc, MVI Wound #3 Midline Coccyx o Other: - Vitamin C, Vitamin A, Zinc, MVI Electronic Signature(s) Signed: 02/25/2017 4:13:19 PM By: Alric Quan Signed: 02/25/2017 4:17:13 PM By: Christin Fudge MD, FACS Entered By: Alric Quan on 02/25/2017 10:58:02 Rachel Butler ([id number]) -------------------------------------------------------------------------------- Problem List Details Patient Name: Rachel Butler Date of Service: 02/25/2017 10:30 AM Medical Record Number: [id number] Patient Account Number: [id number] Date of Birth/Sex: 05-13-1956 (61 y.o. 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 Female) Treating RN: Ahmed Prima Primary Care Provider: Viviana Simpler Other Clinician: Referring Provider: Nolon Stalls Treating Provider/Extender: Frann Rider in Treatment: 0 Subjective Chief Complaint Information obtained from Patient Patient is at the clinic for treatment of an open pressure ulcer to the sacrum gluteal and coccygeal region for about 6 weeks History of Present Illness (HPI) The following HPI elements were documented for the patient's wound: Location: area of her back sacrum and buttocks Quality: Patient reports experiencing a sharp pain to affected area(s). Severity: Patient states wound are getting worse. Duration: Patient has had the wound for >2 months prior to seeking treatment at the wound center Timing: Pain in wound is constant (hurts all the time) Context: The wound appeared gradually over time Modifying Factors: Other treatment(s) tried include:local care as given by a home health Associated Signs and Symptoms: Patient reports having difficulty standing for long periods. 61 year old patient has been recently seen at her PCPs office for chronic heart failure with lymphedema, hypertension, hypomagnesemia, liver cancer and tobacco abuse. Most recent ejection fraction done showed 60-65% along with severe MR, moderate TR and elevated PA pressures. Was recently admitted  for sepsis to the hospital and developed hypotension after an liver biopsy. She had several blood transfusions and had hepatic artery embolization. Past medical history significant for anemia, arthritis, liver cancer, nicotine dependence, status post abdominal hysterectomy, cervical disc surgery, recent hospitalization with interventional radiology procedures done for problems with her liver and treatment of issues with this. She smokes a pack of cigarettes daily and has done this for 40 years. Wound History Patient presents with 4 open wounds that have been present for approximately Jan 18 2017. Patient has been treating wounds in the following manner: keeping it clean. Laboratory tests have not been performed in the last month. Patient reportedly has not tested positive for an antibiotic resistant organism. Patient reportedly has not tested positive for osteomyelitis. Patient reportedly has not had testing performed to evaluate circulation in the legs. Patient experiences the following problems associated with their wounds: swelling. Patient History Information obtained from  Patient. Rachel Butler, Rachel Butler ([id number]) Allergies indomethacin, penicillin, Robaxin, [place] Family History Cancer - Mother,, Heart Disease - Father, Hypertension - Father, Lung Disease - Father. Social History Current some day smoker - 3/4 pack a day, Marital Status - Married, Alcohol Use - Never, Drug Use - No History, Caffeine Use - Daily. Medical History Hematologic/Lymphatic Patient has history of Anemia Respiratory Patient has history of Chronic Obstructive Pulmonary Disease (COPD) Musculoskeletal Patient has history of Osteoarthritis - spinal Neurologic Patient has history of Neuropathy Oncologic Patient has history of Received Chemotherapy - currently Review of Systems (ROS) Constitutional Symptoms ([place]) The patient has no complaints or symptoms. Eyes Complains or has symptoms of Glasses  / Contacts. Ear/Nose/Mouth/Throat The patient has no complaints or symptoms. Hematologic/Lymphatic hx blood transfusions Cardiovascular valve problem per pt Gastrointestinal stage 4 liver cancer Endocrine The patient has no complaints or symptoms. Immunological hx UTI Integumentary (Skin) Complains or has symptoms of Wounds. Musculoskeletal cervical disc disease invertebral disk disease lumbar disc disease Oncologic stage 4 liver cancer Rachel Butler, Rachel Butler ([id number]) Medications clotrimazole oral 1 1 lozenge oral five times daily lidocaine-prilocaine topical cream topical as needed fentanyl 25 mcg/hr transdermal patch transdermal 1 1 patch 72 hour transdermal every three days oxycodone 10 mg tablet oral 1 1 tablet oral every four hours as needed lorazepam 0.5 mg tablet oral 1 1 tablet oral every six hours as needed gabapentin 800 mg tablet oral 1 1 tablet oral three times daily Compazine 10 mg tablet oral 1 1 tablet oral every six hours as needed ondansetron 8 mg disintegrating tablet oral 1 1 tablet,disintegrating oral two times daily as needed promethazine 25 mg tablet oral one half to one tablet oral three times daily as needed metoprolol tartrate 25 mg tablet oral one half tablet oral (12.5 mg.) two times daily dexamethasone 4 mg tablet oral 2 2 tablets oral (8 mg.) daily polyethylene glycol 3350 17 gram oral powder packet oral 1 1 powder in packet oral daily sennosides 8.6 mg-docusate sodium 50 mg tablet oral 2 2 tablets oral daily furosemide 20 mg tablet oral 1 1 tablet oral daily Daily Multivitamin-Minerals tablet oral 1 1 tablet oral daily melatonin 2.5 mg chewable tablet oral 1 1 tablet,chewable oral nightly potassium chloride ER 10 mEq tablet,extended release oral 1 1 tablet extended release oral daily tizanidine 4 mg capsule oral 1 1 capsule oral three times daily; may take up to four capsules at bedtime to sleep biotin 5,000 mcg disintegrating tablet oral 1 1  tablet,disintegrating oral daily Objective Constitutional Pulse regular. Respirations normal and unlabored. Afebrile. Vitals Time Taken: 10:16 AM, Height: 63 in, Source: Stated, Weight: 119 lbs, Source: Measured, BMI: 21.1, Temperature: 98.3 F, Pulse: 85 bpm, Respiratory Rate: 20 breaths/min, Blood Pressure: 74/44 mmHg. General Notes: 73/79 with dinamap and 74/44 manually I made Dr. Con Memos aware of her BP readings. Eyes Nonicteric. Reactive to light. Ears, Nose, Mouth, and Throat Lips, teeth, and gums WNL.Marland Kitchen Moist mucosa without lesions. Neck supple and nontender. No palpable supraclavicular or cervical adenopathy. Normal sized without goiter. Respiratory WNL. No retractions.. Cardiovascular Pedal Pulses WNL. No clubbing, cyanosis or edema. LATECIA, MILER ([id number]) Gastrointestinal (GI) Abdomen without masses or tenderness.. No liver or spleen enlargement or tenderness.. Lymphatic No adneopathy. No adenopathy. No adenopathy. Musculoskeletal Adexa without tenderness or enlargement.. Digits and nails w/o clubbing, cyanosis, infection, petechiae, ischemia, or inflammatory conditions.Marland Kitchen Psychiatric Judgement and insight Intact.. No evidence of depression, anxiety, or agitation.. General Notes: the patient has a stage III pressure injury to  both gluteal areas sacrum and coccygeal regions and no sharp debridement was required today. Integumentary (Hair, Skin) No suspicious lesions. No crepitus or fluctuance. No peri-wound warmth or erythema. No masses.. Wound #1 status is Open. Original cause of wound was Pressure Injury. The wound is located on the Right Gluteus. The wound measures 9.3cm length x 5.1cm width x 0.1cm depth; 37.251cm^2 area and 3.725cm^3 volume. The wound is limited to skin breakdown. There is no tunneling or undermining noted. There is a large amount of serosanguineous drainage noted. The wound margin is distinct with the outline attached to the wound base. There  is small (1-33%) red granulation within the wound bed. There is a large (67-100%) amount of necrotic tissue within the wound bed including Eschar and Adherent Slough. The periwound skin appearance exhibited: Rubor, Erythema. The surrounding wound skin color is noted with erythema which is circumferential. Periwound temperature was noted as No Abnormality. The periwound has tenderness on palpation. Wound #2 status is Open. Original cause of wound was Pressure Injury. The wound is located on the Left Gluteus. The wound measures 3.3cm length x 3.8cm width x 0.1cm depth; 9.849cm^2 area and 0.985cm^3 volume. The wound is limited to skin breakdown. There is no tunneling or undermining noted. There is a large amount of serosanguineous drainage noted. The wound margin is distinct with the outline attached to the wound base. There is no granulation within the wound bed. There is a large (67-100%) amount of necrotic tissue within the wound bed including Adherent Slough. The periwound skin appearance exhibited: Rubor, Erythema. The surrounding wound skin color is noted with erythema which is circumferential. Periwound temperature was noted as No Abnormality. The periwound has tenderness on palpation. Wound #3 status is Open. Original cause of wound was Pressure Injury. The wound is located on the Midline Coccyx. The wound measures 0.6cm length x 2cm width x 0.1cm depth; 0.942cm^2 area and 0.094cm^3 volume. The wound is limited to skin breakdown. There is no tunneling or undermining noted. There is a large amount of serosanguineous drainage noted. The wound margin is distinct with the outline attached to the wound base. There is small (1-33%) red granulation within the wound bed. There is a large (67-100%) amount of necrotic tissue within the wound bed including Adherent Slough. The periwound skin appearance exhibited: Rubor, Erythema. The surrounding wound skin color is noted with erythema which  is circumferential. Periwound temperature was noted as No Abnormality. The periwound has tenderness on palpation. 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 She has significant protein calorie malnutrition and is a chronic smoker for several years. After review have recommended: 1. Silver alginate and a bordered foam to be applied to the area and change daily. 2. offloading has been discussed in great detail with the patient and her husband 3. I have spent over 3 minutes discussing with her the need to completely give up smoking and have discussed the risks benefits and ways to quit smoking 4. Adequate protein, vitamin A, vitamin C and zinc 5. We'll try and obtain proper bed surface and Roho cushions for her recliner and seats. she and her husband have  had all questions answered and will be compliant Plan Wound Cleansing: Wound #1 Right Gluteus: Clean wound with Normal Saline. Wound #2 Left Gluteus: Clean wound with Normal Saline. Wound #3 Midline Coccyx: Clean wound with Normal Saline. Anesthetic: Wound #1 Right Gluteus: Topical Lidocaine 4% cream applied to wound bed prior to debridement - clinic use Wound #2 Left Gluteus: Topical Lidocaine 4% cream applied to wound bed prior to debridement - clinic use Wound #3 Midline Coccyx: Topical Lidocaine 4% cream applied to wound bed prior to debridement - clinic use Skin Barriers/Peri-Wound Care: Wound #1 Right Gluteus: Skin Prep Rachel Butler, Rachel Butler ([id number]) Wound #2  Left Gluteus: Skin Prep Wound #3 Midline Coccyx: Skin Prep Primary Wound Dressing: Wound #1 Right Gluteus: Aquacel Ag Wound #2 Left Gluteus: Aquacel Ag Wound #3 Midline Coccyx: Aquacel Ag Secondary Dressing: Wound #1 Right Gluteus: Boardered Foam Dressing Wound #2 Left Gluteus: Boardered Foam Dressing Wound #3 Midline Coccyx: Boardered Foam Dressing Dressing Change Frequency: Wound #1 Right Gluteus: Change dressing every other day. Wound #2 Left Gluteus: Change dressing every other day. Wound #3 Midline Coccyx: Change dressing every other day. Follow-up Appointments: Wound #1 Right Gluteus: Return Appointment in 1 week. Wound #2 Left Gluteus: Return Appointment in 1 week. Wound #3 Midline Coccyx: Return Appointment in 1 week. Off-Loading: Wound #1 Right Gluteus: Roho cushion for wheelchair - HHRN to order. Mattress - HHRN to order. Mattress appropriate for stage 3 wounds. Turn and reposition every 2 hours Wound #2 Left Gluteus: Roho cushion for wheelchair - HHRN to order. Mattress - HHRN to order. Mattress appropriate for stage 3 wounds. Turn and reposition every 2 hours Wound #3 Midline Coccyx: Roho cushion for wheelchair - HHRN to order. Mattress - HHRN to order. Mattress appropriate for stage 3 wounds. Turn and reposition every 2 hours Additional Orders / Instructions: Wound #1 Right Gluteus: Stop Smoking Increase protein intake. Wound #2 Left Gluteus: Stop Smoking Rachel Butler, Rachel J. ([id number]) Increase protein intake. Wound #3 Midline Coccyx: Stop Smoking Increase protein intake. Home Health: Wound #1 Right Gluteus: [place] for [place] Nurse may visit PRN to address patient s wound care needs. FACE TO FACE ENCOUNTER: MEDICARE and MEDICAID PATIENTS: I certify that this patient is under my care and that I had a face-to-face encounter that meets the physician face-to-face encounter requirements with this patient on  this date. The encounter with the patient was in whole or in part for the following MEDICAL CONDITION: (primary reason for [place]) MEDICAL NECESSITY: I certify, that based on my findings, NURSING services are a medically necessary home health service. HOME BOUND STATUS: I certify that my clinical findings support that this patient is homebound (i.e., Due to illness or injury, pt requires aid of supportive devices such as crutches, cane, wheelchairs, walkers, the use of special transportation or the assistance of another person to leave their place of residence. There is a normal inability to leave the home and doing so requires considerable and taxing effort. Other absences are for medical reasons / religious services and are infrequent or of short duration when for other reasons). If current dressing causes regression in wound condition, may D/C ordered dressing product/s and apply Normal Saline Moist Dressing daily until next [place] / Other MD appointment. [place] of regression in wound condition at [phone]. Please direct any NON-WOUND related issues/requests for orders to patient's Primary Care Physician Wound #2 Left Gluteus: [place] [facility]  [facility] Nurse may visit PRN to address patient s wound care needs. FACE TO FACE ENCOUNTER: MEDICARE and MEDICAID PATIENTS: I certify that this patient is under my care and that I had a face-to-face encounter that meets the physician face-to-face encounter requirements with this patient on this date. The encounter with the patient was in whole or in part for the following MEDICAL CONDITION: (primary reason for [place]) MEDICAL NECESSITY: I certify, that based on my findings, NURSING services are a medically necessary home health service. HOME BOUND STATUS: I certify that my clinical findings support that this patient is homebound (i.e., Due to illness or injury, pt  requires aid of supportive devices such as crutches, cane, wheelchairs, walkers, the use of special transportation or the assistance of another person to leave their place of residence. There is a normal inability to leave the home and doing so requires considerable and taxing effort. Other absences are for medical reasons / religious services and are infrequent or of short duration when for other reasons). If current dressing causes regression in wound condition, may D/C ordered dressing product/s and apply Normal Saline Moist Dressing daily until next [place] / Other MD appointment. [place] of regression in wound condition at [phone]. Please direct any NON-WOUND related issues/requests for orders to patient's Primary Care Physician Wound #3 Midline Coccyx: [place] for [place] Nurse may visit PRN to address patient s wound care needs. FACE TO FACE ENCOUNTER: MEDICARE and MEDICAID PATIENTS: I certify that this patient is under my care and that I had a face-to-face encounter that meets the physician face-to-face encounter requirements with this patient on this date. The encounter with the patient was in whole or in part for the following MEDICAL CONDITION: (primary reason for [place]) MEDICAL NECESSITY: I certify, that based on my findings, NURSING services are a medically necessary home health service. HOME BOUND STATUS: I certify that my clinical findings support that this patient is homebound (i.e., Due to illness or injury, pt requires aid of supportive devices such as crutches, cane, wheelchairs, walkers, the use of special transportation or the assistance of another person to leave their place of residence. There is a normal inability to leave the home and doing so requires considerable and taxing effort. Other absences are Rachel Butler, Rachel Butler ([id number]) for medical reasons / religious services and are  infrequent or of short duration when for other reasons). If current dressing causes regression in wound condition, may D/C ordered dressing product/s and apply Normal Saline Moist Dressing daily until next [place] / Other MD appointment. [place] of regression in wound condition at [phone]. Please direct any NON-WOUND related issues/requests for orders to patient's Primary Care Physician Medications-please add to medication list.: Wound #1 Right Gluteus: Other: - Vitamin C, Vitamin A, Zinc, MVI Wound #2 Left Gluteus: Other: - Vitamin C, Vitamin A, Zinc, MVI Wound #3 Midline Coccyx: Other: - Vitamin C, Vitamin A, Zinc, MVI This 61 year old patient is rather debilitated due to recent diagnosis of liver cancer with multiple issues with prolonged hospitalization and embolization of right hepatic artery. She has significant protein calorie malnutrition and is a chronic smoker for several years. After review have recommended: 1. Silver alginate and a bordered foam to be applied to the area and change daily. 2. offloading has been discussed in great detail with the patient and her husband 3. I have spent over 3 minutes discussing with  her the need to completely give up smoking and have discussed the risks benefits and ways to quit smoking 4. Adequate protein, vitamin A, vitamin C and zinc 5. We'll try and obtain proper bed surface and Roho cushions for her recliner and seats. she and her husband have had all questions answered and will be compliant Electronic Signature(s) Signed: 02/25/2017 11:14:09 AM By: Christin Fudge MD, FACS Entered By: Christin Fudge on 02/25/2017 11:14:09 Rachel Butler ([id number]) -------------------------------------------------------------------------------- ROS/PFSH Details Patient Name: Rachel Butler Date of Service: 02/25/2017 10:30 AM Medical Record Number: [id number] Patient Account Number: [id number] Date of Birth/Sex:  04-06-1956 (61 y.o. Female) Treating RN: Carolyne Fiscal, Debi Primary Care Provider: Viviana Simpler Other Clinician: Referring Provider: Nolon Stalls Treating Provider/Extender: Frann Rider in Treatment: 0 Information Obtained From Patient Wound History Do you currently have one or more open woundso Yes How many open wounds do you currently haveo 4 Approximately how long have you had your woundso Jan 18 2017 How have you been treating your wound(s) until nowo keeping it clean Has your wound(s) ever healed and then re-openedo No Have you had any lab work done in the past montho No Have you tested positive for an antibiotic resistant organism (MRSA, VRE)o No Have you tested positive for osteomyelitis (bone infection)o No Have you had any tests for circulation on your legso No Have you had other problems associated with your woundso Swelling Eyes Complaints and Symptoms: Positive for: Glasses / Contacts Integumentary (Skin) Complaints and Symptoms: Positive for: Wounds Constitutional Symptoms (General Health) Complaints and Symptoms: No Complaints or Symptoms Ear/Nose/Mouth/Throat Complaints and Symptoms: No Complaints or Symptoms Hematologic/Lymphatic Complaints and Symptoms: Review of System Notes: hx blood transfusions Medical HistoryAVYONNA, WAGONER ([id number]) Positive for: Anemia Respiratory Medical History: Positive for: Chronic Obstructive Pulmonary Disease (COPD) Cardiovascular Complaints and Symptoms: Review of System Notes: valve problem per pt Gastrointestinal Complaints and Symptoms: Review of System Notes: stage 4 liver cancer Endocrine Complaints and Symptoms: No Complaints or Symptoms Immunological Complaints and Symptoms: Review of System Notes: hx UTI Musculoskeletal Complaints and Symptoms: Review of System Notes: cervical disc disease invertebral disk disease lumbar disc disease Medical History: Positive for: Osteoarthritis -  spinal Neurologic Medical History: Positive for: Neuropathy Oncologic Complaints and Symptoms: Review of System Notes: stage 4 liver cancer Medical History: ANARELY, NICHOLLS ([id number]) Positive for: Received Chemotherapy - currently Immunizations Pneumococcal Vaccine: Received Pneumococcal Vaccination: Yes Family and Social History Cancer: Yes - Mother,; Heart Disease: Yes - Father; Hypertension: Yes - Father; Lung Disease: Yes - Father; Current some day smoker - 3/4 pack a day; Marital Status - Married; Alcohol Use: Never; Drug Use: No History; Caffeine Use: Daily; Financial Concerns: No; Food, Clothing or Shelter Needs: No; Support System Lacking: No; Transportation Concerns: No; Advanced Directives: No; Patient does not want information on Advanced Directives; Do not resuscitate: No; Living Will: Yes (Not Provided); Medical Power of Attorney: Yes - ronald (husband) (Not Provided) Physician Affirmation I have reviewed and agree with the above information. Electronic Signature(s) Signed: 02/25/2017 4:13:19 PM By: Alric Quan Signed: 02/25/2017 4:17:13 PM By: Christin Fudge MD, FACS Entered By: Christin Fudge on 02/25/2017 10:49:05 Rachel Butler ([id number]) -------------------------------------------------------------------------------- SuperBill Details Patient Name: Rachel Butler Date of Service: 02/25/2017 Medical Record Number: [id number] Patient Account Number: [id number] Date of Birth/Sex: 08-30-1956 (61 y.o. 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) F17.218 Nicotine dependence,  cigarettes, with other nicotine-induced disorders Facility Procedures CPT4 Code: 58850277 Description: 41287 - WOUND CARE VISIT-LEV 5 EST PT Modifier: Quantity: 1 Physician Procedures CPT4 Code Description: 8676720 94709 - WC PHYS LEVEL 4 - NEW PT ICD-10 Description Diagnosis L89.43 Pressure ulcer of contiguous site of back, buttock E44.0 Moderate protein-calorie malnutrition D50.0 Iron deficiency anemia secondary to blood loss (chr  F17.218 Nicotine dependence, cigarettes, with other nicotin Modifier: and hip, stage onic) e-induced disor Quantity: 1 3 ders Electronic Signature(s) Signed: 02/25/2017 4:13:19 PM By: Alric Quan Signed: 02/25/2017 4:17:13 PM By: Christin Fudge MD, FACS Previous Signature: 02/25/2017 11:14:21 AM Version By: Christin Fudge MD, FACS Entered By: Alric Quan on 02/25/2017 11:32:30

## 2017-02-25 NOTE — Progress Notes (Signed)
Subjective:    Patient ID: Rachel Butler, female    DOB: 1956-07-13, 61 y.o.   MRN: [id number]  HPI Here with husband for follow up after biopsy and hospitalization  Has had her first rounds of chemo Initial response with hot feeling in face---had to hold the treatment for a while No vomiting Tolerating it okay Has this week off  Breathing is okay now Mildly hypoxic but no SOB now--was worse last week at CHF clinic Still with fluid--back on the furosemide again  Chronic pain issues Oncologists handling the current pain issues   Current Outpatient Prescriptions on File Prior to Visit  Medication Sig Dispense Refill  . BIOTIN 5000 PO Take by mouth daily.    . clotrimazole (MYCELEX) 10 MG troche Take 1 lozenge (10 mg total) by mouth 5 (five) times daily. 50 lozenge 0  . dexamethasone (DECADRON) 4 MG tablet Take 2 tablets (8 mg total) by mouth daily. Start the day after chemotherapy for 2 days. 30 tablet 1  . fentaNYL (DURAGESIC - DOSED MCG/HR) 25 MCG/HR patch Place 25 mcg onto the skin every 3 (three) days.   0  . furosemide (LASIX) 20 MG tablet Take 1 tablet (20 mg total) by mouth daily. 30 tablet 5  . gabapentin (NEURONTIN) 800 MG tablet Take 1 tablet (800 mg total) by mouth 3 (three) times daily. 90 tablet 11  . lidocaine-prilocaine (EMLA) cream Apply 1 application topically as needed. Apply small amount to port site at least 1 hour prior to it being accessed, cover with plastic wrap 30 g 1  . LORazepam (ATIVAN) 0.5 MG tablet Take 1 tablet (0.5 mg total) by mouth every 6 (six) hours as needed (Nausea or vomiting). 30 tablet 0  . Melatonin 2.5 MG CAPS Take 1 capsule by mouth at bedtime.    . metoprolol tartrate (LOPRESSOR) 25 MG tablet Take 0.5 tablets (12.5 mg total) by mouth 2 (two) times daily. 60 tablet 0  . Multiple Vitamins-Minerals (MULTIVITAMIN WITH MINERALS) tablet Take 1 tablet by mouth daily.    . ondansetron (ZOFRAN) 8 MG tablet Take 1 tablet (8 mg total) by mouth 2  (two) times daily as needed for refractory nausea / vomiting. Start on day 3 after chemo. 30 tablet 1  . oxyCODONE (OXYCONTIN) 10 mg 12 hr tablet Take 1 tablet (10 mg total) by mouth every 12 (twelve) hours. 60 tablet 0  . Oxycodone HCl 10 MG TABS Take 1 tablet (10 mg total) by mouth every 4 (four) hours as needed. 60 tablet 0  . polyethylene glycol (MIRALAX / GLYCOLAX) packet Take 17 g by mouth daily. 14 each 0  . potassium chloride (K-DUR) 10 MEQ tablet Take 1 tablet (10 mEq total) by mouth daily. 30 tablet 5  . prochlorperazine (COMPAZINE) 10 MG tablet Take 1 tablet (10 mg total) by mouth every 6 (six) hours as needed (Nausea or vomiting). 30 tablet 1  . promethazine (PHENERGAN) 25 MG tablet TAKE 1/2 TO 1 TABLETS BY MOUTH 3 TIMES DAILY AS NEEDED FOR NAUSEA 30 tablet 0  . sennosides-docusate sodium (SENOKOT-S) 8.6-50 MG tablet Take 2 tablets by mouth daily.    Marland Kitchen tiZANidine (ZANAFLEX) 4 MG capsule Take 4 mg by mouth 3 (three) times daily. Takes 1 tid and up to 3 tablets at bedtime to sleep     No current facility-administered medications on file prior to visit.     Allergies  Allergen Reactions  . Indomethacin Other (See Comments)    Can not  urinate  . Penicillins Rash  . Robaxin [Methocarbamol] Other (See Comments)    Urinary retention  . Nubain [Nalbuphine Hcl]     Past Medical History:  Diagnosis Date  . Anemia   . Arthritis   . Blood transfusion without reported diagnosis   . Cancer ([place])   . Cervical disc disease   . COPD (chronic obstructive pulmonary disease) ([place])   . Intervertebral disk disease   . Lumbar disc disease   . Migraines   . Nicotine dependence   . UTI (urinary tract infection)     Past Surgical History:  Procedure Laterality Date  . ABDOMINAL HYSTERECTOMY  1986   right tube and ovary removed--then laparoscopy after for ?retained cyst?  . CERVICAL DISCECTOMY  1993   C5-6  . CESAREAN SECTION  [id number]  . COLONOSCOPY    . disk removal     c5c6  . IR  GENERIC HISTORICAL  01/18/2017   IR EMBO ART  VEN HEMORR LYMPH EXTRAV  INC GUIDE ROADMAPPING 01/18/2017 Greggory Keen, MD [facility]  . IR GENERIC HISTORICAL  01/18/2017   IR ANGIOGRAM SELECTIVE EACH ADDITIONAL VESSEL 01/18/2017 Greggory Keen, MD [facility]  . IR GENERIC HISTORICAL  01/18/2017   IR ANGIOGRAM SELECTIVE EACH ADDITIONAL VESSEL 01/18/2017 Greggory Keen, MD [facility]  . IR GENERIC HISTORICAL  01/18/2017   IR US GUIDE VASC ACCESS RIGHT 01/18/2017 Greggory Keen, MD [facility]  . IR GENERIC HISTORICAL  01/18/2017   IR ANGIOGRAM VISCERAL SELECTIVE 01/18/2017 Greggory Keen, MD [facility]  . IR GENERIC HISTORICAL  01/18/2017   IR ANGIOGRAM SELECTIVE EACH ADDITIONAL VESSEL 01/18/2017 Greggory Keen, MD [facility]  . IR GENERIC HISTORICAL  01/18/2017   IR ANGIOGRAM VISCERAL SELECTIVE 01/18/2017 Greggory Keen, MD [facility]  . IR GENERIC HISTORICAL  02/06/2017   IR FLUORO GUIDE PORT INSERTION RIGHT 02/06/2017 Marybelle Killings, MD [facility]  . TEE WITHOUT CARDIOVERSION N/A 01/24/2017   Procedure: TRANSESOPHAGEAL ECHOCARDIOGRAM (TEE)  WITH ANESTHESIA;  Surgeon: Sueanne Margarita, MD;  Location: [facility];  Service: Cardiovascular;  Laterality: N/A;  . [place]  . TUBAL LIGATION      Family History  Problem Relation Age of Onset  . Cancer Mother   . COPD Father   . Heart disease Father   . Hypertension Father   . Thyroid disease Other   . Cancer Other     mother's side  . Heart disease Other     Dad's side  . Colon cancer Neg Hx     Social History   Social History  . Marital status: Married    Spouse name: N/A  . Number of children: 2  . Years of education: N/A   Occupational History  . Social research officer, government ---retired   . 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 Smoking status: Current Every Day Smoker    Packs/day: 1.00    Years: 40.00    Types: Cigarettes  . Smokeless tobacco: Never Used  . Alcohol use No  . Drug use:  No  . Sexual activity: Not on file   Other Topics Concern  . Not on file   Social History Narrative   [name] daughter      Has living will   Husband is health care POA   Would accept resuscitation but no prolonged ventilation   No tube feeds if cognitively unaware   Review of Systems Not weighing herself  Leg swelling is better Eating okay Sleeping well Chronic cough---has cut down to 3/4 PPD     Objective:   Physical Exam  Constitutional: No distress.  Neck: No thyromegaly present.  Cardiovascular: Normal rate and regular rhythm.  Exam reveals no gallop.   No murmur heard. Pulmonary/Chest: No respiratory distress.  Dullness and decreased breath sounds at right base  Musculoskeletal:  2+ pitting edema in calves and slightly into calves  Lymphadenopathy:    She has no cervical adenopathy.          Assessment & Plan:

## 2017-02-25 NOTE — Progress Notes (Signed)
MURRY, KHIEV ([id number]) Visit Report for 02/25/2017 Allergy List Details Patient Name: Rachel Butler, Rachel Butler. Date of Service: 02/25/2017 10:30 AM Medical Record Number: [id number] Patient Account Number: [id number] Date of Birth/Sex: Jun 25, 1956 (61 y.o. Female) Treating RN: Carolyne Fiscal, Debi Primary Care Ramina Hulet: Viviana Simpler Other Clinician: Referring Dashon Mcintire: Nolon Stalls Treating Livie Vanderhoof/Extender: Frann Rider in Treatment: 0 Allergies Active Allergies indomethacin penicillin Robaxin Nubain Allergy Notes Electronic Signature(s) Signed: 02/25/2017 4:13:19 PM By: Alric Quan Entered By: Alric Quan on 02/25/2017 10:20:17 Rachel Butler ([id number]) -------------------------------------------------------------------------------- Arrival Information Details Patient Name: Rachel Butler Date of Service: 02/25/2017 10:30 AM Medical Record Number: [id number] Patient Account Number: [id number] Date of Birth/Sex: [birth year] (61 y.o. Female) Treating RN: Carolyne Fiscal, Debi Primary Care Sherene Plancarte: Viviana Simpler Other Clinician: Referring Zoa Dowty: Nolon Stalls Treating Gabrille Kilbride/Extender: Frann Rider in Treatment: 0 Visit Information Patient Arrived: Wheel Chair Arrival Time: 10:11 Accompanied By: husband Transfer Assistance: EasyPivot Patient Lift Patient Identification Verified: Yes Secondary Verification Process Yes Completed: Patient Requires Transmission- No Based Precautions: Patient Has Alerts: No Electronic Signature(s) Signed: 02/25/2017 4:13:19 PM By: Alric Quan Entered By: Alric Quan on 02/25/2017 10:12:14 Rachel Butler ([id number]) -------------------------------------------------------------------------------- Clinic Level of Care Assessment Details Patient Name: Rachel Butler Date of Service: 02/25/2017 10:30 AM Medical Record Number: [id number] Patient Account Number: [id number] Date of Birth/Sex:  24-Jun-1956 (61 y.o. Female) Treating RN: Carolyne Fiscal, Debi Primary Care Caitlinn Klinker: Viviana Simpler Other Clinician: Referring Mikka Kissner: Nolon Stalls Treating Beatriz Settles/Extender: Frann Rider in Treatment: 0 Clinic Level of Care Assessment Items TOOL 2 Quantity Score X - Use when only an EandM is performed on the INITIAL visit 1 0 ASSESSMENTS - Nursing Assessment / Reassessment X - General Physical Exam (combine w/ comprehensive assessment (listed just 1 20 below) when performed on new pt. evals) X - Comprehensive Assessment (HX, ROS, Risk Assessments, Wounds Hx, etc.) 1 25 ASSESSMENTS - Wound and Skin Assessment / Reassessment []  - Simple Wound Assessment / Reassessment - one wound 0 X - Complex Wound Assessment / Reassessment - multiple wounds 3 5 []  - Dermatologic / Skin Assessment (not related to wound area) 0 ASSESSMENTS - Ostomy and/or Continence Assessment and Care []  - Incontinence Assessment and Management 0 []  - Ostomy Care Assessment and Management (repouching, etc.) 0 PROCESS - Coordination of Care []  - Simple Patient / Family Education for ongoing care 0 X - Complex (extensive) Patient / Family Education for ongoing care 1 20 X - Staff obtains Programmer, systems, Records, Test Results / Process Orders 1 10 X - Staff telephones HHA, Nursing Homes / Clarify orders / etc 1 10 []  - Routine Transfer to another Facility (non-emergent condition) 0 []  - Routine Hospital Admission (non-emergent condition) 0 X - New Admissions / Biomedical engineer / Ordering NPWT, Apligraf, etc. 1 15 []  - Emergency Hospital Admission (emergent condition) 0 X - Simple Discharge Coordination 1 10 Fike, Kimeka J. ([id number]) []  - Complex (extensive) Discharge Coordination 0 PROCESS - Special Needs []  - Pediatric / Minor Patient Management 0 []  - Isolation Patient Management 0 []  - Hearing / Language / Visual special needs 0 []  - Assessment of Community assistance (transportation, D/C  planning, etc.) 0 []  - Additional assistance / Altered mentation 0 []  - Support Surface(s) Assessment (bed, cushion, seat, etc.) 0 INTERVENTIONS - Wound Cleansing / Measurement X - Wound Imaging (photographs - any number of wounds) 1 5 []  - Wound Tracing (instead of photographs) 0 []  - Simple Wound Measurement - one wound 0 X - Complex Wound Measurement -  multiple wounds 3 5 []  - Simple Wound Cleansing - one wound 0 X - Complex Wound Cleansing - multiple wounds 3 5 INTERVENTIONS - Wound Dressings []  - Small Wound Dressing one or multiple wounds 0 X - Medium Wound Dressing one or multiple wounds 1 15 []  - Large Wound Dressing one or multiple wounds 0 []  - Application of Medications - injection 0 INTERVENTIONS - Miscellaneous []  - External ear exam 0 []  - Specimen Collection (cultures, biopsies, blood, body fluids, etc.) 0 []  - Specimen(s) / Culture(s) sent or taken to Lab for analysis 0 X - Patient Transfer (multiple staff / Civil Service fast streamer / Similar devices) 1 10 []  - Simple Staple / Suture removal (25 or less) 0 []  - Complex Staple / Suture removal (26 or more) 0 Nedrow, Benay J. ([id number]) []  - Hypo / Hyperglycemic Management (close monitor of Blood Glucose) 0 []  - Ankle / Brachial Index (ABI) - do not check if billed separately 0 Has the patient been seen at the hospital within the last three years: Yes Total Score: 185 Level Of Care: New/Established - Level 5 Electronic Signature(s) Signed: 02/25/2017 4:13:19 PM By: Alric Quan Entered By: Alric Quan on 02/25/2017 11:32:17 Rachel Butler ([id number]) -------------------------------------------------------------------------------- Encounter Discharge Information Details Patient Name: Rachel Butler Date of Service: 02/25/2017 10:30 AM Medical Record Number: [id number] Patient Account Number: [id number] Date of Birth/Sex: Jul 17, 1956 (61 y.o. Female) Treating RN: Carolyne Fiscal, Debi Primary Care Armida Vickroy: Viviana Simpler Other Clinician: Referring Bodi Palmeri: Nolon Stalls Treating Keysi Oelkers/Extender: Frann Rider in Treatment: 0 Encounter Discharge Information Items Discharge Pain Level: 4 Discharge Condition: Stable Ambulatory Status: Wheelchair Discharge Destination: Home Transportation: Private Auto Accompanied By: husband Schedule Follow-up Appointment: Yes Medication Reconciliation completed No and provided to Patient/Care Evetta Renner: Provided on Clinical Summary of Care: 02/25/2017 Form Type Recipient Paper Patient TI Electronic Signature(s) Signed: 02/25/2017 11:07:46 AM By: Ruthine Dose Entered By: Ruthine Dose on 02/25/2017 11:07:46 Rachel Butler ([id number]) -------------------------------------------------------------------------------- Lower Extremity Assessment Details Patient Name: Rachel Butler Date of Service: 02/25/2017 10:30 AM Medical Record Number: [id number] Patient Account Number: [id number] Date of Birth/Sex: 10-20-1956 (61 y.o. Female) Treating RN: Carolyne Fiscal, Debi Primary Care Koltyn Kelsay: Viviana Simpler Other Clinician: Referring Nicandro Perrault: Nolon Stalls Treating Rossie Bretado/Extender: Frann Rider in Treatment: 0 Electronic Signature(s) Signed: 02/25/2017 4:13:19 PM By: Alric Quan Entered By: Alric Quan on 02/25/2017 10:29:36 Rachel Butler ([id number]) -------------------------------------------------------------------------------- Multi Wound Chart Details Patient Name: Rachel Butler Date of Service: 02/25/2017 10:30 AM Medical Record Number: [id number] Patient Account Number: [id number] Date of Birth/Sex: 1956-04-13 (61 y.o. Female) Treating RN: Carolyne Fiscal, Debi Primary Care Jahzier Villalon: Viviana Simpler Other Clinician: Referring Zephaniah Lubrano: Nolon Stalls Treating Janathan Bribiesca/Extender: Frann Rider in Treatment: 0 Vital Signs Height(in): 63 Pulse(bpm): 85 Weight(lbs): 119 Blood Pressure 74/44 (mmHg): Body Mass  Index(BMI): 21 Temperature(F): 98.3 Respiratory Rate 20 (breaths/min): Photos: [1:No Photos] [2:No Photos] [3:No Photos] Wound Location: [1:Right Gluteus] [2:Left Gluteus] [3:Coccyx - Midline] Wounding Event: [1:Pressure Injury] [2:Pressure Injury] [3:Pressure Injury] Primary Etiology: [1:Pressure Ulcer] [2:Pressure Ulcer] [3:Pressure Ulcer] Comorbid History: [1:Anemia, Chronic Obstructive Pulmonary Disease (COPD), Osteoarthritis, Neuropathy, Received Chemotherapy] [2:Anemia, Chronic Obstructive Pulmonary Disease (COPD), Osteoarthritis, Neuropathy, Received Chemotherapy] [3:Anemia, Chronic  Obstructive Pulmonary Disease (COPD), Osteoarthritis, Neuropathy, Received Chemotherapy] Date Acquired: [1:01/18/2017] [2:01/18/2017] [3:01/18/2017] Weeks of Treatment: [1:0] [2:0] [3:0] Wound Status: [1:Open] [2:Open] [3:Open] Measurements L x W x D 9.3x5.1x0.1 [2:3.3x3.8x0.1] [3:0.6x2x0.1] (cm) Area (cm) : [1:37.251] [2:9.849] [3:0.942] Volume (cm) : [1:3.725] [2:0.985] [3:0.094] Classification: [1:Category/Stage II] [2:Category/Stage II] [3:Category/Stage II] Exudate Amount: [1:Large] [2:Large] [3:Large]  Exudate Type: [1:Serosanguineous] [2:Serosanguineous] [3:Serosanguineous] Exudate Color: [1:red, brown] [2:red, brown] [3:red, brown] Wound Margin: [1:Distinct, outline attached] [2:Distinct, outline attached] [3:Distinct, outline attached] Granulation Amount: [1:Small (1-33%)] [2:None Present (0%)] [3:Small (1-33%)] Granulation Quality: [1:Red] [2:N/A] [3:Red] Necrotic Amount: [1:Large (67-100%)] [2:Large (67-100%)] [3:Large (67-100%)] Necrotic Tissue: [1:Eschar, Adherent Slough] [2:Adherent Slough] [3:Adherent Slough] Exposed Structures: [1:Fascia: No Fat Layer (Subcutaneous Tissue) Exposed: No Tendon: No] [2:Fascia: No Fat Layer (Subcutaneous Tissue) Exposed: No Tendon: No] [3:Fascia: No Fat Layer (Subcutaneous Tissue) Exposed: No Tendon: No] Muscle: No Muscle: No Muscle: No Joint:  No Joint: No Joint: No Bone: No Bone: No Bone: No Limited to Skin Limited to Skin Limited to Skin Breakdown Breakdown Breakdown Epithelialization: None None None Periwound Skin Texture: No Abnormalities Noted No Abnormalities Noted No Abnormalities Noted Periwound Skin No Abnormalities Noted No Abnormalities Noted No Abnormalities Noted Moisture: Periwound Skin Color: Erythema: Yes Erythema: Yes Erythema: Yes Rubor: Yes Rubor: Yes Rubor: Yes Erythema Location: Circumferential Circumferential Circumferential Temperature: No Abnormality No Abnormality No Abnormality Tenderness on Yes Yes Yes Palpation: Wound Preparation: Ulcer Cleansing: Ulcer Cleansing: Ulcer Cleansing: Rinsed/Irrigated with Rinsed/Irrigated with Rinsed/Irrigated with Saline Saline Saline Topical Anesthetic Topical Anesthetic Topical Anesthetic Applied: Other: lidocaine Applied: Other: lidocaine Applied: Other: lidocaine 4% 4% 4% Treatment Notes Wound #1 (Right Gluteus) 1. Cleansed with: Clean wound with Normal Saline 2. Anesthetic Topical Lidocaine 4% cream to wound bed prior to debridement 3. Peri-wound Care: Skin Prep 4. Dressing Applied: Aquacel Ag 5. Secondary Dressing Applied Bordered Foam Dressing Wound #2 (Left Gluteus) 1. Cleansed with: Clean wound with Normal Saline 2. Anesthetic Topical Lidocaine 4% cream to wound bed prior to debridement 3. Peri-wound Care: Skin Prep 4. Dressing Applied: Aquacel Ag 5. Secondary Dressing Applied Bordered Foam Dressing SHERBY, MONCAYO ([id number]) Wound #3 (Midline Coccyx) 1. Cleansed with: Clean wound with Normal Saline 2. Anesthetic Topical Lidocaine 4% cream to wound bed prior to debridement 3. Peri-wound Care: Skin Prep 4. Dressing Applied: Aquacel Ag 5. Secondary Dressing Applied Bordered Foam Dressing Electronic Signature(s) Signed: 02/25/2017 11:08:21 AM By: Christin Fudge MD, FACS Entered By: Christin Fudge on 02/25/2017  11:08:21 Rachel Butler ([id number]) -------------------------------------------------------------------------------- [place] Details Patient Name: Rachel Butler, Rachel Butler Date of Service: 02/25/2017 10:30 AM Medical Record Number: [id number] Patient Account Number: [id number] Date of Birth/Sex: 1956/07/25 (61 y.o. Female) Treating RN: Carolyne Fiscal, Debi Primary Care Cambell Rickenbach: Viviana Simpler Other Clinician: Referring Ziona Wickens: Nolon Stalls Treating Caelin Rosen/Extender: Frann Rider in Treatment: 0 Active Inactive ` Abuse / Safety / Falls / Self Care Management Nursing Diagnoses: Potential for falls Goals: Patient will remain injury free Date Initiated: 02/25/2017 Target Resolution Date: 06/29/2017 Goal Status: Active Interventions: Assess fall risk on admission and as needed Assess impairment of mobility on admission and as needed per policy Assess self care needs on admission and as needed Notes: ` Nutrition Nursing Diagnoses: Imbalanced nutrition Potential for alteratiion in Nutrition/Potential for imbalanced nutrition Goals: Patient/caregiver agrees to and verbalizes understanding of need to use nutritional supplements and/or vitamins as prescribed Date Initiated: 02/25/2017 Target Resolution Date: 06/29/2017 Goal Status: Active Interventions: Assess patient nutrition upon admission and as needed per policy Notes: ` Orientation to the Wound Care Program WILLENA, JEANCHARLES ([id number]) Nursing Diagnoses: Knowledge deficit related to the wound healing center program Goals: Patient/caregiver will verbalize understanding of the [place] Program Date Initiated: 02/25/2017 Target Resolution Date: 03/30/2017 Goal Status: Active Interventions: Provide education on orientation to the wound center Notes: ` Pain, Acute or Chronic Nursing Diagnoses: Pain, acute or chronic:  actual or potential Potential alteration in comfort,  pain Goals: Patient/caregiver will verbalize adequate pain control between visits Date Initiated: 02/25/2017 Target Resolution Date: 06/29/2017 Goal Status: Active Interventions: Assess comfort goal upon admission Complete pain assessment as per visit requirements Notes: ` Pressure Nursing Diagnoses: Knowledge deficit related to causes and risk factors for pressure ulcer development Knowledge deficit related to management of pressures ulcers Potential for impaired tissue integrity related to pressure, friction, moisture, and shear Goals: Patient/caregiver will verbalize understanding of pressure ulcer management Date Initiated: 02/25/2017 Target Resolution Date: 06/29/2017 Goal Status: Active Interventions: Assess: immobility, friction, shearing, incontinence upon admission and as needed OVETA, IDRIS ([id number]) Assess offloading mechanisms upon admission and as needed Assess potential for pressure ulcer upon admission and as needed Provide education on pressure ulcers Notes: ` Wound/Skin Impairment Nursing Diagnoses: Impaired tissue integrity Knowledge deficit related to smoking impact on wound healing Knowledge deficit related to ulceration/compromised skin integrity Goals: Ulcer/skin breakdown will have a volume reduction of 80% by week 12 Date Initiated: 02/25/2017 Target Resolution Date: 06/29/2017 Goal Status: Active Interventions: Assess patient/caregiver ability to perform ulcer/skin care regimen upon admission and as needed Assess ulceration(s) every visit Provide education on smoking Notes: Electronic Signature(s) Signed: 02/25/2017 4:13:19 PM By: Alric Quan Entered By: Alric Quan on 02/25/2017 10:51:49 Rachel Butler ([id number]) -------------------------------------------------------------------------------- Pain Assessment Details Patient Name: Rachel Butler Date of Service: 02/25/2017 10:30 AM Medical Record Number: [id number] Patient  Account Number: [id number] Date of Birth/Sex: Apr 14, 1956 (61 y.o. Female) Treating RN: Carolyne Fiscal, Debi Primary Care Anchor Dwan: Viviana Simpler Other Clinician: Referring Demeshia Sherburne: Nolon Stalls Treating Skylen Danielsen/Extender: Frann Rider in Treatment: 0 Active Problems Location of Pain Severity and Description of Pain Patient Has Paino Yes Site Locations Pain Location: Pain in Ulcers With Dressing Change: Yes Rate the pain. Current Pain Level: 6 Character of Pain Describe the Pain: Burning Pain Management and Medication Current Pain Management: Electronic Signature(s) Signed: 02/25/2017 4:13:19 PM By: Alric Quan Entered By: Alric Quan on 02/25/2017 10:12:44 Rachel Butler ([id number]) -------------------------------------------------------------------------------- Patient/Caregiver Education Details Patient Name: Rachel Butler Date of Service: 02/25/2017 10:30 AM Medical Record Number: [id number] Patient Account Number: [id number] Date of Birth/Gender: Dec 12, 1955 (61 y.o. Female) Treating RN: Carolyne Fiscal, Debi Primary Care Physician: Viviana Simpler Other Clinician: Referring Physician: Nolon Stalls Treating Physician/Extender: Frann Rider in Treatment: 0 Education Assessment Education Provided To: Patient Education Topics Provided Pressure: Handouts: Pressure Ulcers: Care and Offloading Methods: Explain/Verbal Responses: State content correctly Smoking and Wound Healing: Handouts: Smoking and Wound Healing Methods: Explain/Verbal Responses: State content correctly Welcome To [facility]: Handouts: Welcome To [facility] Methods: Explain/Verbal Responses: State content correctly Wound/Skin Impairment: Handouts: Other: change dressing as ordered Methods: Demonstration, Explain/Verbal Responses: State content correctly Electronic Signature(s) Signed: 02/25/2017 4:13:19 PM By: Alric Quan Entered By:  Alric Quan on 02/25/2017 10:54:04 Rachel Butler ([id number]) -------------------------------------------------------------------------------- Wound Assessment Details Patient Name: Rachel Butler Date of Service: 02/25/2017 10:30 AM Medical Record Number: [id number] Patient Account Number: [id number] Date of Birth/Sex: 1956-09-11 (61 y.o. Female) Treating RN: Carolyne Fiscal, Debi Primary Care Makaylin Carlo: Viviana Simpler Other Clinician: Referring Garey Alleva: Nolon Stalls Treating Imri Lor/Extender: Frann Rider in Treatment: 0 Wound Status Wound Number: 1 Primary Pressure Ulcer Etiology: Wound Location: Right Gluteus Wound Open Wounding Event: Pressure Injury Status: Date Acquired: 01/18/2017 Comorbid Anemia, Chronic Obstructive Pulmonary Weeks Of Treatment: 0 History: Disease (COPD), Osteoarthritis, Clustered Wound: No Neuropathy, Received Chemotherapy Photos Photo Uploaded By: Alric Quan on 02/25/2017 11:33:47 Wound Measurements Length: (cm) 9.3  Width: (cm) 5.1 Depth: (cm) 0.1 Area: (cm) 37.251 Volume: (cm) 3.725 % Reduction in Area: % Reduction in Volume: Epithelialization: None Tunneling: No Undermining: No Wound Description Classification: Category/Stage II Foul Odor Aft Wound Margin: Distinct, outline attached Slough/Fibrin Exudate Amount: Large Exudate Type: Serosanguineous Exudate Color: red, brown er Cleansing: No o Yes Wound Bed Granulation Amount: Small (1-33%) Exposed Structure Granulation Quality: Red Fascia Exposed: No Necrotic Amount: Large (67-100%) Fat Layer (Subcutaneous Tissue) Exposed: No Necrotic Quality: Eschar, Adherent Slough Tendon Exposed: No BRIANI, MAUL ([id number]) Muscle Exposed: No Joint Exposed: No Bone Exposed: No Limited to Skin Breakdown Periwound Skin Texture Texture Color No Abnormalities Noted: No No Abnormalities Noted: No Erythema: Yes Moisture Erythema Location: Circumferential No  Abnormalities Noted: No Rubor: Yes Temperature / Pain Temperature: No Abnormality Tenderness on Palpation: Yes Wound Preparation Ulcer Cleansing: Rinsed/Irrigated with Saline Topical Anesthetic Applied: Other: lidocaine 4%, Treatment Notes Wound #1 (Right Gluteus) 1. Cleansed with: Clean wound with Normal Saline 2. Anesthetic Topical Lidocaine 4% cream to wound bed prior to debridement 3. Peri-wound Care: Skin Prep 4. Dressing Applied: Aquacel Ag 5. Secondary Dressing Applied Bordered Foam Dressing Electronic Signature(s) Signed: 02/25/2017 4:13:19 PM By: Alric Quan Entered By: Alric Quan on 02/25/2017 10:42:21 Rachel Butler ([id number]) -------------------------------------------------------------------------------- Wound Assessment Details Patient Name: Rachel Butler Date of Service: 02/25/2017 10:30 AM Medical Record Number: [id number] Patient Account Number: [id number] Date of Birth/Sex: 1956/10/19 (61 y.o. Female) Treating RN: Carolyne Fiscal, Debi Primary Care Makala Fetterolf: Viviana Simpler Other Clinician: Referring Lebron Nauert: Nolon Stalls Treating Marq Rebello/Extender: Frann Rider in Treatment: 0 Wound Status Wound Number: 2 Primary Pressure Ulcer Etiology: Wound Location: Left Gluteus Wound Open Wounding Event: Pressure Injury Status: Date Acquired: 01/18/2017 Comorbid Anemia, Chronic Obstructive Pulmonary Weeks Of Treatment: 0 History: Disease (COPD), Osteoarthritis, Clustered Wound: No Neuropathy, Received Chemotherapy Photos Photo Uploaded By: Alric Quan on 02/25/2017 11:33:47 Wound Measurements Length: (cm) 3.3 Width: (cm) 3.8 Depth: (cm) 0.1 Area: (cm) 9.849 Volume: (cm) 0.985 % Reduction in Area: % Reduction in Volume: Epithelialization: None Tunneling: No Undermining: No Wound Description Classification: Category/Stage II Foul Odor Afte Wound Margin: Distinct, outline attached Slough/Fibrino Exudate Amount:  Large Exudate Type: Serosanguineous Exudate Color: red, brown r Cleansing: No No Wound Bed Granulation Amount: None Present (0%) Exposed Structure Necrotic Amount: Large (67-100%) Fascia Exposed: No Necrotic Quality: Adherent Slough Fat Layer (Subcutaneous Tissue) Exposed: No Tendon Exposed: No ARIS, MOMAN. ([id number]) Muscle Exposed: No Joint Exposed: No Bone Exposed: No Limited to Skin Breakdown Periwound Skin Texture Texture Color No Abnormalities Noted: No No Abnormalities Noted: No Erythema: Yes Moisture Erythema Location: Circumferential No Abnormalities Noted: No Rubor: Yes Temperature / Pain Temperature: No Abnormality Tenderness on Palpation: Yes Wound Preparation Ulcer Cleansing: Rinsed/Irrigated with Saline Topical Anesthetic Applied: Other: lidocaine 4%, Treatment Notes Wound #2 (Left Gluteus) 1. Cleansed with: Clean wound with Normal Saline 2. Anesthetic Topical Lidocaine 4% cream to wound bed prior to debridement 3. Peri-wound Care: Skin Prep 4. Dressing Applied: Aquacel Ag 5. Secondary Dressing Applied Bordered Foam Dressing Electronic Signature(s) Signed: 02/25/2017 4:13:19 PM By: Alric Quan Entered By: Alric Quan on 02/25/2017 10:43:24 Rachel Butler ([id number]) -------------------------------------------------------------------------------- Wound Assessment Details Patient Name: Rachel Butler Date of Service: 02/25/2017 10:30 AM Medical Record Number: [id number] Patient Account Number: [id number] Date of Birth/Sex: September 02, 1956 (61 y.o. Female) Treating RN: Carolyne Fiscal, Debi Primary Care Crissy Mccreadie: Viviana Simpler Other Clinician: Referring Lou Irigoyen: Nolon Stalls Treating Trenee Igoe/Extender: Frann Rider in Treatment: 0 Wound Status Wound Number: 3 Primary Pressure Ulcer Etiology: Wound  Location: Coccyx - Midline Wound Open Wounding Event: Pressure Injury Status: Date Acquired: 01/18/2017 Comorbid Anemia,  Chronic Obstructive Pulmonary Weeks Of Treatment: 0 History: Disease (COPD), Osteoarthritis, Clustered Wound: No Neuropathy, Received Chemotherapy Photos Photo Uploaded By: Alric Quan on 02/25/2017 11:34:04 Wound Measurements Length: (cm) 0.6 Width: (cm) 2 Depth: (cm) 0.1 Area: (cm) 0.942 Volume: (cm) 0.094 % Reduction in Area: % Reduction in Volume: Epithelialization: None Tunneling: No Undermining: No Wound Description Classification: Category/Stage II Foul Odor Aft Wound Margin: Distinct, outline attached Slough/Fibrin Exudate Amount: Large Exudate Type: Serosanguineous Exudate Color: red, brown er Cleansing: No o No Wound Bed Granulation Amount: Small (1-33%) Exposed Structure Granulation Quality: Red Fascia Exposed: No Necrotic Amount: Large (67-100%) Fat Layer (Subcutaneous Tissue) Exposed: No Necrotic Quality: Adherent Slough Tendon Exposed: No SIMARA, RHYNER ([id number]) Muscle Exposed: No Joint Exposed: No Bone Exposed: No Limited to Skin Breakdown Periwound Skin Texture Texture Color No Abnormalities Noted: No No Abnormalities Noted: No Erythema: Yes Moisture Erythema Location: Circumferential No Abnormalities Noted: No Rubor: Yes Temperature / Pain Temperature: No Abnormality Tenderness on Palpation: Yes Wound Preparation Ulcer Cleansing: Rinsed/Irrigated with Saline Topical Anesthetic Applied: Other: lidocaine 4%, Treatment Notes Wound #3 (Midline Coccyx) 1. Cleansed with: Clean wound with Normal Saline 2. Anesthetic Topical Lidocaine 4% cream to wound bed prior to debridement 3. Peri-wound Care: Skin Prep 4. Dressing Applied: Aquacel Ag 5. Secondary Dressing Applied Bordered Foam Dressing Electronic Signature(s) Signed: 02/25/2017 4:13:19 PM By: Alric Quan Entered By: Alric Quan on 02/25/2017 10:44:23 Rachel Butler  ([id number]) -------------------------------------------------------------------------------- [place] Details Patient Name: Rachel Butler Date of Service: 02/25/2017 10:30 AM Medical Record Number: [id number] Patient Account Number: [id number] Date of Birth/Sex: 17-Dec-1955 (61 y.o. Female) Treating RN: Carolyne Fiscal, Debi Primary Care Kwamane Whack: Viviana Simpler Other Clinician: Referring Lashica Hannay: Nolon Stalls Treating Feras Gardella/Extender: Frann Rider in Treatment: 0 Vital Signs Time Taken: 10:16 Temperature (F): 98.3 Height (in): 63 Pulse (bpm): 85 Source: Stated Respiratory Rate (breaths/min): 20 Weight (lbs): 119 Blood Pressure (mmHg): 74/44 Source: Measured Reference Range: 80 - 120 mg / dl Body Mass Index (BMI): 21.1 Notes 73/79 with dinamap and 74/44 manually I made Dr. Con Memos aware of her BP readings. Electronic Signature(s) Signed: 02/25/2017 4:13:19 PM By: Alric Quan Entered By: Alric Quan on 02/25/2017 10:18:09

## 2017-02-25 NOTE — Progress Notes (Signed)
Pre visit review using our clinic review tool, if applicable. No additional management support is needed unless otherwise documented below in the visit note. 

## 2017-02-27 DIAGNOSIS — G43909 Migraine, unspecified, not intractable, without status migrainosus: Secondary | ICD-10-CM | POA: Diagnosis not present

## 2017-02-27 DIAGNOSIS — D376 Neoplasm of uncertain behavior of liver, gallbladder and bile ducts: Secondary | ICD-10-CM | POA: Diagnosis not present

## 2017-02-27 DIAGNOSIS — M5090 Cervical disc disorder, unspecified, unspecified cervical region: Secondary | ICD-10-CM | POA: Diagnosis not present

## 2017-02-27 DIAGNOSIS — F17210 Nicotine dependence, cigarettes, uncomplicated: Secondary | ICD-10-CM | POA: Diagnosis not present

## 2017-02-27 DIAGNOSIS — M5136 Other intervertebral disc degeneration, lumbar region: Secondary | ICD-10-CM | POA: Diagnosis not present

## 2017-02-27 DIAGNOSIS — G894 Chronic pain syndrome: Secondary | ICD-10-CM | POA: Diagnosis not present

## 2017-02-28 DIAGNOSIS — D376 Neoplasm of uncertain behavior of liver, gallbladder and bile ducts: Secondary | ICD-10-CM | POA: Diagnosis not present

## 2017-02-28 DIAGNOSIS — M5090 Cervical disc disorder, unspecified, unspecified cervical region: Secondary | ICD-10-CM | POA: Diagnosis not present

## 2017-02-28 DIAGNOSIS — G43909 Migraine, unspecified, not intractable, without status migrainosus: Secondary | ICD-10-CM | POA: Diagnosis not present

## 2017-02-28 DIAGNOSIS — M5136 Other intervertebral disc degeneration, lumbar region: Secondary | ICD-10-CM | POA: Diagnosis not present

## 2017-02-28 DIAGNOSIS — G894 Chronic pain syndrome: Secondary | ICD-10-CM | POA: Diagnosis not present

## 2017-02-28 DIAGNOSIS — F17210 Nicotine dependence, cigarettes, uncomplicated: Secondary | ICD-10-CM | POA: Diagnosis not present

## 2017-03-01 DIAGNOSIS — G894 Chronic pain syndrome: Secondary | ICD-10-CM | POA: Diagnosis not present

## 2017-03-01 DIAGNOSIS — D376 Neoplasm of uncertain behavior of liver, gallbladder and bile ducts: Secondary | ICD-10-CM | POA: Diagnosis not present

## 2017-03-01 DIAGNOSIS — M5090 Cervical disc disorder, unspecified, unspecified cervical region: Secondary | ICD-10-CM | POA: Diagnosis not present

## 2017-03-01 DIAGNOSIS — G43909 Migraine, unspecified, not intractable, without status migrainosus: Secondary | ICD-10-CM | POA: Diagnosis not present

## 2017-03-01 DIAGNOSIS — F17210 Nicotine dependence, cigarettes, uncomplicated: Secondary | ICD-10-CM | POA: Diagnosis not present

## 2017-03-01 DIAGNOSIS — M5136 Other intervertebral disc degeneration, lumbar region: Secondary | ICD-10-CM | POA: Diagnosis not present

## 2017-03-04 ENCOUNTER — Encounter: Payer: Medicare Other | Admitting: Surgery

## 2017-03-04 DIAGNOSIS — F17218 Nicotine dependence, cigarettes, with other nicotine-induced disorders: Secondary | ICD-10-CM | POA: Diagnosis not present

## 2017-03-04 DIAGNOSIS — L8943 Pressure ulcer of contiguous site of back, buttock and hip, stage 3: Secondary | ICD-10-CM | POA: Diagnosis not present

## 2017-03-04 DIAGNOSIS — J449 Chronic obstructive pulmonary disease, unspecified: Secondary | ICD-10-CM | POA: Diagnosis not present

## 2017-03-04 DIAGNOSIS — E440 Moderate protein-calorie malnutrition: Secondary | ICD-10-CM | POA: Diagnosis not present

## 2017-03-04 DIAGNOSIS — L8942 Pressure ulcer of contiguous site of back, buttock and hip, stage 2: Secondary | ICD-10-CM | POA: Diagnosis not present

## 2017-03-04 DIAGNOSIS — Z880 Allergy status to penicillin: Secondary | ICD-10-CM | POA: Diagnosis not present

## 2017-03-04 DIAGNOSIS — D500 Iron deficiency anemia secondary to blood loss (chronic): Secondary | ICD-10-CM | POA: Diagnosis not present

## 2017-03-05 NOTE — Progress Notes (Signed)
SHAMETRA, [place] ([id number]) Visit Report for 03/04/2017 Arrival Information Details Patient Name: Rachel Butler, Rachel Butler. Date of Service: 03/04/2017 8:45 AM Medical Record Number: [id number] Patient Account Number: [id number] Date of Birth/Sex: Aug 03, 1956 (61 y.o. Female) Treating RN: Carolyne Fiscal, Debi Primary Care Militza Devery: Viviana Simpler Other Clinician: Referring Jaleya Pebley: Viviana Simpler Treating Chava Dulac/Extender: Frann Rider in Treatment: 1 Visit Information History Since Last Visit All ordered tests and consults were completed: No Patient Arrived: Wheel Chair Added or deleted any medications: No Arrival Time: 08:59 Any new allergies or adverse reactions: No Accompanied By: husband Had a fall or experienced change in No Transfer Assistance: EasyP[employer] activities of daily living that may affect Patient Lift risk of falls: Patient Identification Verified: Yes Signs or symptoms of abuse/neglect since last No Secondary Verification Process Yes visito Completed: Hospitalized since last visit: No Patient Requires Transmission- No Has Dressing in Place as Prescribed: Yes Based Precautions: Pain Present Now: Yes Patient Has Alerts: No Electronic Signature(s) Signed: 03/04/2017 4:32:54 PM By: Alric Quan Entered By: Alric Quan on 03/04/2017 08:59:43 Rachel Butler ([id number]) -------------------------------------------------------------------------------- Encounter Discharge Information Details Patient Name: Rachel Butler Date of Service: 03/04/2017 8:45 AM Medical Record Number: [id number] Patient Account Number: [id number] Date of Birth/Sex: 02-17-1956 (61 y.o. Female) Treating RN: Carolyne Fiscal, Debi Primary Care Caliya Narine: Viviana Simpler Other Clinician: Referring [place] Flight: Viviana Simpler Treating Riannah Stagner/Extender: Frann Rider in Treatment: 1 Encounter Discharge Information Items Discharge Pain Level: 0 Discharge Condition: Stable Ambulatory  Status: Wheelchair Discharge Destination: Home Transportation: Private Auto Accompanied By: husband Schedule Follow-up Appointment: Yes Medication Reconciliation completed No and provided to Patient/Care Eliot Bencivenga: Provided on Clinical Summary of Care: 03/04/2017 Form Type Recipient Paper Patient TI Electronic Signature(s) Signed: 03/04/2017 9:42:08 AM By: Ruthine Dose Entered By: Ruthine Dose on 03/04/2017 09:42:08 Rachel Butler ([id number]) -------------------------------------------------------------------------------- Lower Extremity Assessment Details Patient Name: Rachel Butler Date of Service: 03/04/2017 8:45 AM Medical Record Number: [id number] Patient Account Number: [id number] Date of Birth/Sex: 08/01/1956 (61 y.o. Female) Treating RN: Carolyne Fiscal, Debi Primary Care Selby Slovacek: Viviana Simpler Other Clinician: Referring Suleyman Ehrman: Viviana Simpler Treating Moyses Pavey/Extender: Frann Rider in Treatment: 1 Electronic Signature(s) Signed: 03/04/2017 4:32:54 PM By: Alric Quan Entered By: Alric Quan on 03/04/2017 09:12:51 Rachel Butler ([id number]) -------------------------------------------------------------------------------- Multi Wound Chart Details Patient Name: Rachel Butler Date of Service: 03/04/2017 8:45 AM Medical Record Number: [id number] Patient Account Number: [id number] Date of Birth/Sex: 10-12-1956 (61 y.o. Female) Treating RN: Carolyne Fiscal, Debi Primary Care Desi Rowe: Viviana Simpler Other Clinician: Referring Yannick Steuber: Viviana Simpler Treating Mya Suell/Extender: Frann Rider in Treatment: 1 Vital Signs Height(in): 63 Pulse(bpm): 83 Weight(lbs): 119 Blood Pressure 91/54 (mmHg): Body Mass Index(BMI): 21 Temperature(F): 97.6 Respiratory Rate 18 (breaths/min): Photos: [1:No Photos] [N/A:N/A] Wound Location: [1:Gluteus] [N/A:N/A] Wounding Event: [1:Pressure Injury] [N/A:N/A] Primary Etiology: [1:Pressure Ulcer]  [N/A:N/A] Comorbid History: [1:Anemia, Chronic Obstructive Pulmonary Disease (COPD), Osteoarthritis, Neuropathy, Received Chemotherapy] [N/A:N/A] Date Acquired: [1:01/18/2017] [N/A:N/A] Weeks of Treatment: [1:1] [N/A:N/A] Wound Status: [1:Open] [N/A:N/A] Clustered Wound: [1:Yes] [N/A:N/A] Clustered Quantity: [1:2] [N/A:N/A] Measurements L x W x D 11x10.6x0.1 [N/A:N/A] (cm) Area (cm) : [1:91.577] [N/A:N/A] Volume (cm) : [1:9.158] [N/A:N/A] % Reduction in Area: [1:-145.80%] [N/A:N/A] % Reduction in Volume: -145.90% [N/A:N/A] Classification: [1:Category/Stage II] [N/A:N/A] Exudate Amount: [1:Large] [N/A:N/A] Exudate Type: [1:Serosanguineous] [N/A:N/A] Exudate Color: [1:red, brown] [N/A:N/A] Wound Margin: [1:Distinct, outline attached] [N/A:N/A] Granulation Amount: [1:Small (1-33%)] [N/A:N/A] Granulation Quality: [1:Red] [N/A:N/A] Necrotic Amount: [1:Large (67-100%)] [N/A:N/A] Necrotic Tissue: [1:Eschar, Adherent Slough] [N/A:N/A] Exposed Structures: Fascia: No N/A N/A Fat Layer (Subcutaneous Tissue) Exposed: No Tendon: No Muscle:  No Joint: No Bone: No Limited to Skin Breakdown Epithelialization: None N/A N/A Debridement: Debridement ([id number]- N/A N/A 11047) Pre-procedure 09:25 N/A N/A Verification/Time Out Taken: Pain Control: Lidocaine 4% Topical N/A N/A Solution Tissue Debrided: Fibrin/Slough, Exudates, N/A N/A Subcutaneous Level: Skin/Subcutaneous N/A N/A Tissue Debridement Area (sq 20 N/A N/A cm): Instrument: Forceps, Scissors N/A N/A Bleeding: Minimum N/A N/A Hemostasis Achieved: Pressure N/A N/A Procedural Pain: 0 N/A N/A Post Procedural Pain: 0 N/A N/A Debridement Treatment Procedure was tolerated N/A N/A Response: well Post Debridement 4x5x0.1 N/A N/A Measurements L x W x D (cm) Post Debridement 1.571 N/A N/A Volume: (cm) Post Debridement Category/Stage II N/A N/A Stage: Periwound Skin Texture: No Abnormalities Noted N/A N/A Periwound Skin No  Abnormalities Noted N/A N/A Moisture: Periwound Skin Color: Erythema: Yes N/A N/A Rubor: Yes Erythema Location: Circumferential N/A N/A Temperature: No Abnormality N/A N/A Tenderness on Yes N/A N/A Palpation: Wound Preparation: Ulcer Cleansing: N/A N/A Rinsed/Irrigated with Saline [place], Jaryah Lenna Sciara ([id number]) Topical Anesthetic Applied: Other: lidocaine 4% Procedures Performed: Debridement N/A N/A Treatment Notes Electronic Signature(s) Signed: 03/04/2017 9:31:09 AM By: Christin Fudge MD, FACS Entered By: Christin Fudge on 03/04/2017 09:31:09 Rachel Butler ([id number]) -------------------------------------------------------------------------------- [place] Details Patient Name: Rachel Butler Date of Service: 03/04/2017 8:45 AM Medical Record Number: [id number] Patient Account Number: [id number] Date of Birth/Sex: 08-Sep-1956 (61 y.o. Female) Treating RN: Carolyne Fiscal, Debi Primary Care Fabian Coca: Viviana Simpler Other Clinician: Referring Vianney Kopecky: Viviana Simpler Treating Aaren Atallah/Extender: Frann Rider in Treatment: 1 Active Inactive ` Abuse / Safety / Falls / Self Care Management Nursing Diagnoses: Potential for falls Goals: Patient will remain injury free Date Initiated: 02/25/2017 Target Resolution Date: 06/29/2017 Goal Status: Active Interventions: Assess fall risk on admission and as needed Assess impairment of mobility on admission and as needed per policy Assess self care needs on admission and as needed Notes: ` Nutrition Nursing Diagnoses: Imbalanced nutrition Potential for alteratiion in Nutrition/Potential for imbalanced nutrition Goals: Patient/caregiver agrees to and verbalizes understanding of need to use nutritional supplements and/or vitamins as prescribed Date Initiated: 02/25/2017 Target Resolution Date: 06/29/2017 Goal Status: Active Interventions: Assess patient nutrition upon admission and as needed per  policy Notes: ` Orientation to the Wound Care Program Rachel Butler, Rachel Butler ([id number]) Nursing Diagnoses: Knowledge deficit related to the wound healing center program Goals: Patient/caregiver will verbalize understanding of the [facility] Date Initiated: 02/25/2017 Target Resolution Date: 03/30/2017 Goal Status: Active Interventions: Provide education on orientation to the wound center Notes: ` Pain, Acute or Chronic Nursing Diagnoses: Pain, acute or chronic: actual or potential Potential alteration in comfort, pain Goals: Patient/caregiver will verbalize adequate pain control between visits Date Initiated: 02/25/2017 Target Resolution Date: 06/29/2017 Goal Status: Active Interventions: Assess comfort goal upon admission Complete pain assessment as per visit requirements Notes: ` Pressure Nursing Diagnoses: Knowledge deficit related to causes and risk factors for pressure ulcer development Knowledge deficit related to management of pressures ulcers Potential for impaired tissue integrity related to pressure, friction, moisture, and shear Goals: Patient/caregiver will verbalize understanding of pressure ulcer management Date Initiated: 02/25/2017 Target Resolution Date: 06/29/2017 Goal Status: Active Interventions: Assess: immobility, friction, shearing, incontinence upon admission and as needed Rachel Butler, Rachel Butler ([id number]) Assess offloading mechanisms upon admission and as needed Assess potential for pressure ulcer upon admission and as needed Provide education on pressure ulcers Notes: ` Wound/Skin Impairment Nursing Diagnoses: Impaired tissue integrity Knowledge deficit related to smoking impact on wound healing Knowledge deficit related to ulceration/compromised skin integrity Goals: Ulcer/skin breakdown  will have a volume reduction of 80% by week 12 Date Initiated: 02/25/2017 Target Resolution Date: 06/29/2017 Goal Status: Active Interventions: Assess  patient/caregiver ability to perform ulcer/skin care regimen upon admission and as needed Assess ulceration(s) every visit Provide education on smoking Notes: Electronic Signature(s) Signed: 03/04/2017 4:32:54 PM By: Alric Quan Entered By: Alric Quan on 03/04/2017 09:12:55 Rachel Butler ([id number]) -------------------------------------------------------------------------------- Pain Assessment Details Patient Name: Rachel Butler Date of Service: 03/04/2017 8:45 AM Medical Record Number: [id number] Patient Account Number: [id number] Date of Birth/Sex: 1956/10/22 (61 y.o. Female) Treating RN: Carolyne Fiscal, Debi Primary Care Lashawnta Burgert: Viviana Simpler Other Clinician: Referring Elna Radovich: Viviana Simpler Treating Imari Sivertsen/Extender: Frann Rider in Treatment: 1 Active Problems Location of Pain Severity and Description of Pain Patient Has Paino Yes Site Locations Pain Location: Pain in Ulcers With Dressing Change: Yes Character of Pain Describe the Pain: Burning, Tender, Throbbing Pain Management and Medication Current Pain Management: Electronic Signature(s) Signed: 03/04/2017 4:32:54 PM By: Alric Quan Entered By: Alric Quan on 03/04/2017 08:59:55 Rachel Butler ([id number]) -------------------------------------------------------------------------------- Patient/Caregiver Education Details Patient Name: Rachel Butler Date of Service: 03/04/2017 8:45 AM Medical Record Number: [id number] Patient Account Number: [id number] Date of Birth/Gender: 1956-05-25 (60 y.o. Female) Treating RN: Carolyne Fiscal, Debi Primary Care Physician: Viviana Simpler Other Clinician: Referring Physician: Viviana Simpler Treating Physician/Extender: Frann Rider in Treatment: 1 Education Assessment Education Provided To: Patient Education Topics Provided Wound/Skin Impairment: Handouts: Other: change dressing as ordered Methods: Demonstration,  Explain/Verbal Responses: State content correctly Electronic Signature(s) Signed: 03/04/2017 4:32:54 PM By: Alric Quan Entered By: Alric Quan on 03/04/2017 09:21:48 Rachel Butler ([id number]) -------------------------------------------------------------------------------- Wound Assessment Details Patient Name: Rachel Butler Date of Service: 03/04/2017 8:45 AM Medical Record Number: [id number] Patient Account Number: [id number] Date of Birth/Sex: [birth year] (61 y.o. Female) Treating RN: Carolyne Fiscal, Debi Primary Care Reizel Calzada: Viviana Simpler Other Clinician: Referring Aivan Fillingim: Viviana Simpler Treating Holden Maniscalco/Extender: Frann Rider in Treatment: 1 Wound Status Wound Number: 1 Primary Pressure Ulcer Etiology: Wound Location: Gluteus Wound Open Wounding Event: Pressure Injury Status: Date Acquired: 01/18/2017 Comorbid Anemia, Chronic Obstructive Pulmonary Weeks Of Treatment: 1 History: Disease (COPD), Osteoarthritis, Clustered Wound: Yes Neuropathy, Received Chemotherapy Photos Photo Uploaded By: Alric Quan on 03/04/2017 11:41:11 Wound Measurements Length: (cm) 11 % Reduction i Width: (cm) 10.6 % Reduction i Depth: (cm) 0.1 Epithelializa Clustered Quantity: 2 Tunneling: Area: (cm) 91.577 Undermining: Volume: (cm) 9.158 n Area: -145.8% n Volume: -145.9% tion: None No No Wound Description Classification: Category/Stage II Foul Odor Aft Wound Margin: Distinct, outline attached Slough/Fibrin Exudate Amount: Large Exudate Type: Serosanguineous Exudate Color: red, brown er Cleansing: No o Yes Wound Bed Granulation Amount: Small (1-33%) Exposed Structure Granulation Quality: Red Fascia Exposed: No Necrotic Amount: Large (67-100%) Fat Layer (Subcutaneous Tissue) Exposed: No Rachel Butler, Rachel Butler ([id number]) Necrotic Quality: Eschar, Adherent Slough Tendon Exposed: No Muscle Exposed: No Joint Exposed: No Bone Exposed: No Limited to  Skin Breakdown Periwound Skin Texture Texture Color No Abnormalities Noted: No No Abnormalities Noted: No Erythema: Yes Moisture Erythema Location: Circumferential No Abnormalities Noted: No Rubor: Yes Temperature / Pain Temperature: No Abnormality Tenderness on Palpation: Yes Wound Preparation Ulcer Cleansing: Rinsed/Irrigated with Saline Topical Anesthetic Applied: Other: lidocaine 4%, Treatment Notes Wound #1 (Gluteus) 1. Cleansed with: Clean wound with Normal Saline 2. Anesthetic Topical Lidocaine 4% cream to wound bed prior to debridement 3. Peri-wound Care: Skin Prep 4. Dressing Applied: Santyl Ointment 5. Secondary Dressing Applied Bordered Foam Dressing Dry Gauze Electronic Signature(s) Signed: 03/04/2017 4:32:54 PM By: Alric Quan Entered By:  Alric Quan on 03/04/2017 09:12:42 Rachel Butler, Rachel Butler ([id number]) -------------------------------------------------------------------------------- [place] Details Patient Name: Rachel Butler, Rachel Butler. Date of Service: 03/04/2017 8:45 AM Medical Record Number: [id number] Patient Account Number: [id number] Date of Birth/Sex: October 29, 1956 (62 y.o. Female) Treating RN: Carolyne Fiscal, Debi Primary Care Nathan Moctezuma: Viviana Simpler Other Clinician: Referring Kamonte Mcmichen: Viviana Simpler Treating Prospero Mahnke/Extender: Frann Rider in Treatment: 1 Vital Signs Time Taken: 08:59 Temperature (F): 97.6 Height (in): 63 Pulse (bpm): 83 Weight (lbs): 119 Respiratory Rate (breaths/min): 18 Body Mass Index (BMI): 21.1 Blood Pressure (mmHg): 91/54 Reference Range: 80 - 120 mg / dl Electronic Signature(s) Signed: 03/04/2017 4:32:54 PM By: Alric Quan Entered By: Alric Quan on 03/04/2017 09:05:32

## 2017-03-05 NOTE — Progress Notes (Addendum)
Rachel Butler, Rachel Butler ([id number]) Visit Report for 03/04/2017 Chief Complaint Document Details Patient Name: Rachel Butler, Rachel Butler. Date of Service: 03/04/2017 8:45 AM Medical Record Number: [id number] Patient Account Number: [id number] Date of Birth/Sex: 25-Dec-1955 (61 y.o. Female) Treating RN: Carolyne Fiscal, Debi Primary Care Provider: Viviana Simpler Other Clinician: Referring Provider: Viviana Simpler Treating Provider/Extender: Frann Rider in Treatment: 1 Information Obtained from: Patient Chief Complaint Patient is at the clinic for treatment of an open pressure ulcer to the sacrum gluteal and coccygeal region for about 6 weeks Electronic Signature(s) Signed: 03/04/2017 9:32:09 AM By: Christin Fudge MD, FACS Entered By: Christin Fudge on 03/04/2017 09:32:08 Rachel Butler ([id number]) -------------------------------------------------------------------------------- Debridement Details Patient Name: Rachel Butler Date of Service: 03/04/2017 8:45 AM Medical Record Number: [id number] Patient Account Number: [id number] Date of Birth/Sex: 03-09-1956 (61 y.o. Female) Treating RN: Carolyne Fiscal, Debi Primary Care Provider: Viviana Simpler Other Clinician: Referring Provider: Viviana Simpler Treating Provider/Extender: Frann Rider in Treatment: 1 Debridement Performed for Wound #1 Gluteus Assessment: Performed By: Physician Christin Fudge, MD Debridement: Debridement Pre-procedure Yes - 09:25 Verification/Time Out Taken: Start Time: 09:26 Pain Control: Lidocaine 4% Topical Solution Level: Skin/Subcutaneous Tissue Total Area Debrided (L x 4 (cm) x 5 (cm) = 20 (cm) W): Tissue and other Viable, Non-Viable, Exudate, Fibrin/Slough, Subcutaneous material debrided: Instrument: Forceps, Scissors Bleeding: Minimum Hemostasis Achieved: Pressure End Time: 09:27 Procedural Pain: 0 Post Procedural Pain: 0 Response to Treatment: Procedure was tolerated well Post Debridement  Measurements of Total Wound Length: (cm) 11 Stage: Category/Stage II Width: (cm) 10.6 Depth: (cm) 0.2 Volume: (cm) 18.315 Character of Wound/Ulcer Post Requires Further Debridement: Debridement Severity of Tissue Post Fat layer exposed Debridement: Post Procedure Diagnosis Same as Pre-procedure Electronic Signature(s) Signed: 03/04/2017 9:32:01 AM By: Christin Fudge MD, FACS Signed: 03/04/2017 4:32:54 PM By: Alric Quan Previous Signature: 03/04/2017 9:31:24 AM Version By: Christin Fudge MD, FACS Rachel Butler, Rachel Butler ([id number]) Entered By: Christin Fudge on 03/04/2017 09:32:01 Rachel Butler ([id number]) -------------------------------------------------------------------------------- HPI Details Patient Name: Rachel Butler Date of Service: 03/04/2017 8:45 AM Medical Record Number: [id number] Patient Account Number: [id number] Date of Birth/Sex: 10-24-1956 (61 y.o. Female) Treating RN: Carolyne Fiscal, Debi Primary Care Provider: Viviana Simpler Other Clinician: Referring Provider: Viviana Simpler Treating Provider/Extender: Frann Rider in Treatment: 1 History of Present Illness Location: area of her back sacrum and buttocks Quality: Patient reports experiencing a sharp pain to affected area(s). Severity: Patient states wound are getting worse. Duration: Patient has had the wound for >2 months prior to seeking treatment at the wound center Timing: Pain in wound is constant (hurts all the time) Context: The wound appeared gradually over time Modifying Factors: Other treatment(s) tried include:local care as given by a home health Associated Signs and Symptoms: Patient reports having difficulty standing for long periods. HPI Description: 61 year old patient has been recently seen at her PCPs office for chronic heart failure with lymphedema, hypertension, hypomagnesemia, liver cancer and tobacco abuse. Most recent ejection fraction done showed 60-65% along with severe MR,  moderate TR and elevated PA pressures. Was recently admitted for sepsis to the hospital and developed hypotension after an liver biopsy. She had several blood transfusions and had hepatic artery embolization. Past medical history significant for anemia, arthritis, liver cancer, nicotine dependence, status post abdominal hysterectomy, cervical disc surgery, recent hospitalization with interventional radiology procedures done for problems with her liver and treatment of issues with this. She smokes a pack of cigarettes daily and has done this for 40 years. Electronic Signature(s) Signed: 03/04/2017 9:32:16 AM By:  Christin Fudge MD, FACS Entered By: Christin Fudge on 03/04/2017 09:32:16 Rachel Butler ([id number]) -------------------------------------------------------------------------------- Physical Exam Details Patient Name: Rachel Butler Date of Service: 03/04/2017 8:45 AM Medical Record Number: [id number] Patient Account Number: [id number] Date of Birth/Sex: 1956-10-02 (61 y.o. Female) Treating RN: Carolyne Fiscal, Debi Primary Care Provider: Viviana Simpler Other Clinician: Referring Provider: Viviana Simpler Treating Provider/Extender: Frann Rider in Treatment: 1 Constitutional . Pulse regular. Respirations normal and unlabored. Afebrile. . Eyes Nonicteric. Reactive to light. Ears, Nose, Mouth, and Throat Lips, teeth, and gums WNL.Marland Kitchen Moist mucosa without lesions. Neck supple and nontender. No palpable supraclavicular or cervical adenopathy. Normal sized without goiter. Respiratory WNL. No retractions.. Breath sounds WNL, No rubs, rales, rhonchi, or wheeze.. Cardiovascular Heart rhythm and rate regular, no murmur or gallop.. Pedal Pulses WNL. No clubbing, cyanosis or edema. Chest Breasts symmetical and no nipple discharge.. Breast tissue WNL, no masses, lumps, or tenderness.. Lymphatic No adneopathy. No adenopathy. No adenopathy. Musculoskeletal Adexa without tenderness or  enlargement.. Digits and nails w/o clubbing, cyanosis, infection, petechiae, ischemia, or inflammatory conditions.. Integumentary (Hair, Skin) No suspicious lesions. No crepitus or fluctuance. No peri-wound warmth or erythema. No masses.Marland Kitchen Psychiatric Judgement and insight Intact.. No evidence of depression, anxiety, or agitation.. Notes The patient continues to have a large amount of necrotic debris and I have sharply removed as much as I could with a forceps and scissors. She will need chemical debridement with Santyl ointment locally. Electronic Signature(s) Signed: 03/04/2017 9:32:48 AM By: Christin Fudge MD, FACS Entered By: Christin Fudge on 03/04/2017 09:32:48 Rachel Butler ([id number]) -------------------------------------------------------------------------------- Physician Orders Details Patient Name: Rachel Butler Date of Service: 03/04/2017 8:45 AM Medical Record Number: [id number] Patient Account Number: [id number] Date of Birth/Sex: 11-28-1955 (61 y.o. Female) Treating RN: Carolyne Fiscal, Debi Primary Care Provider: Viviana Simpler Other Clinician: Referring Provider: Viviana Simpler Treating Provider/Extender: Frann Rider in Treatment: 1 Verbal / Phone Orders: Yes Clinician: [place], Debi Read Back and Verified: Yes Diagnosis Coding Wound Cleansing Wound #1 Gluteus o Clean wound with Normal Saline. o Cleanse wound with mild soap and water Anesthetic Wound #1 Gluteus o Topical Lidocaine 4% cream applied to wound bed prior to debridement - clinic use Skin Barriers/Peri-Wound Care Wound #1 Gluteus o Skin Prep Primary Wound Dressing Wound #1 Gluteus o Santyl Ointment - go back to silver alginate only if pt cannot get the santyl Secondary Dressing Wound #1 Gluteus o Dry Gauze o Boardered Foam Dressing Dressing Change Frequency Wound #1 Gluteus o Change dressing every other day. Follow-up Appointments Wound #1 Gluteus o Return  Appointment in 1 week. Off-Loading Wound #1 Gluteus o Roho cushion for wheelchair - HHRN to order. [place] to order. Mattress appropriate for stage 3 wounds. o Turn and reposition every 2 hours Rachel Butler, Rachel Butler. ([id number]) Additional Orders / Instructions Wound #1 Gluteus o Stop Smoking o Increase protein intake. Home Health Wound #1 [place] Visits - [place] Nurse may visit PRN to address patientos wound care needs. o FACE TO FACE ENCOUNTER: MEDICARE and MEDICAID PATIENTS: I certify that this patient is under my care and that I had a face-to-face encounter that meets the physician face-to-face encounter requirements with this patient on this date. The encounter with the patient was in whole or in part for the following MEDICAL CONDITION: (primary reason for [place]) MEDICAL NECESSITY: I certify, that based on my findings, NURSING services are a medically necessary home health service. HOME BOUND STATUS: I certify  that my clinical findings support that this patient is homebound (i.e., Due to illness or injury, pt requires aid of supportive devices such as crutches, cane, wheelchairs, walkers, the use of special transportation or the assistance of another person to leave their place of residence. There is a normal inability to leave the home and doing so requires considerable and taxing effort. Other absences are for medical reasons / religious services and are infrequent or of short duration when for other reasons). o If current dressing causes regression in wound condition, may D/C ordered dressing product/s and apply Normal Saline Moist Dressing daily until next [place] / Other MD appointment. [place] of regression in wound condition at [phone]. o Please direct any NON-WOUND related issues/requests for orders to patient's Primary Care Physician Medications-please add to  medication list. Wound #1 Gluteus o Santyl Enzymatic Ointment o Other: - Vitamin C, Vitamin A, Zinc, MVI Patient Medications Allergies: indomethacin, penicillin, Robaxin, Nubain Notifications Medication Indication Start End Santyl 03/04/2017 DOSE 90 - topical 250 unit/gram ointment - ointment topical as directed Electronic Signature(s) Signed: 03/04/2017 9:30:49 AM By: Christin Fudge MD, FACS Entered By: Christin Fudge on 03/04/2017 09:30:48 Rachel Butler ([id number]) -------------------------------------------------------------------------------- Problem List Details Patient Name: Rachel Butler Date of Service: 03/04/2017 8:45 AM Medical Record Number: [id number] Patient Account Number: [id number] Date of Birth/Sex: 1956-11-23 (61 y.o. 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 Female) Treating RN: Carolyne Fiscal, Debi Primary Care Provider: Viviana Simpler Other  Clinician: Referring Provider: Viviana Simpler Treating Provider/Extender: Frann Rider in Treatment: 1 Subjective Chief Complaint Information obtained from Patient Patient is at the clinic for treatment of an open pressure ulcer to the sacrum gluteal and coccygeal region for about 6 weeks History of Present Illness (HPI) The following HPI elements were documented for the patient's wound: Location: area of her back sacrum and buttocks Quality: Patient reports experiencing a sharp pain to affected area(s). Severity: Patient states wound are getting worse. Duration: Patient has had the wound for >2 months prior to seeking treatment at the wound center Timing: Pain in wound is constant (hurts all the time) Context: The wound appeared gradually over time Modifying Factors: Other treatment(s) tried include:local care as given by a home health Associated Signs and Symptoms: Patient reports having difficulty standing for long periods. 61 year old patient has been recently seen at her PCPs office for chronic heart failure with lymphedema, hypertension, hypomagnesemia, liver cancer and tobacco abuse. Most recent ejection fraction done showed 60-65% along with severe MR, moderate TR and elevated PA pressures. Was recently admitted for sepsis to the hospital and developed hypotension after an liver biopsy. She had several blood transfusions and had hepatic artery embolization. Past medical history significant for anemia, arthritis, liver cancer, nicotine dependence, status post abdominal  hysterectomy, cervical disc surgery, recent hospitalization with interventional radiology procedures done for problems with her liver and treatment of issues with this. She smokes a pack of cigarettes daily and has done this for 40 years. Objective Constitutional Pulse regular. Respirations normal and unlabored. Afebrile. Vitals Time Taken: 8:59 AM, Height: 63 in, Weight: 119 lbs, BMI: 21.1, Temperature:  97.6 F, Pulse: 83 bpm, Respiratory Rate: 18 breaths/min, Blood Pressure: 91/54 mmHg. Rachel Butler, Rachel Butler ([id number]) Eyes Nonicteric. Reactive to light. Ears, Nose, Mouth, and Throat Lips, teeth, and gums WNL.Marland Kitchen Moist mucosa without lesions. Neck supple and nontender. No palpable supraclavicular or cervical adenopathy. Normal sized without goiter. Respiratory WNL. No retractions.. Breath sounds WNL, No rubs, rales, rhonchi, or wheeze.. Cardiovascular Heart rhythm and rate regular, no murmur or gallop.. Pedal Pulses WNL. No clubbing, cyanosis or edema. Chest Breasts symmetical and no nipple discharge.. Breast tissue WNL, no masses, lumps, or tenderness.. Lymphatic No adneopathy. No adenopathy. No adenopathy. Musculoskeletal Adexa without tenderness or enlargement.. Digits and nails w/o clubbing, cyanosis, infection, petechiae, ischemia, or inflammatory conditions.Marland Kitchen Psychiatric Judgement and insight Intact.. No evidence of depression, anxiety, or agitation.. General Notes: The patient continues to have a large amount of necrotic debris and I have sharply removed as much as I could with a forceps and scissors. She will need chemical debridement with Santyl ointment locally. Integumentary (Hair, Skin) No suspicious lesions. No crepitus or fluctuance. No peri-wound warmth or erythema. No masses.. Wound #1 status is Open. Original cause of wound was Pressure Injury. The wound is located on the Gluteus. The wound measures 11cm length x 10.6cm width x 0.1cm depth; 91.577cm^2 area and 9.158cm^3 volume. The wound is limited to skin breakdown. There is no tunneling or undermining noted. There is a large amount of serosanguineous drainage noted. The wound margin is distinct with the outline attached to the wound base. There is small (1-33%) red granulation within the wound bed. There is a large (67-100%) amount of necrotic tissue within the wound bed including Eschar and Adherent Slough.  The periwound skin appearance exhibited: Rubor, Erythema. The surrounding wound skin color is noted with erythema which is circumferential. Periwound temperature was noted as No Abnormality. The periwound has tenderness on palpation. 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 There was a Skin/Subcutaneous Tissue Debridement ([id number]) debridement with total area of 20 sq cm performed by Christin Fudge, MD. with the following instrument(s): Forceps and Scissors to remove Viable and Non-Viable tissue/material including Exudate, Fibrin/Slough, and Subcutaneous after achieving pain control using Lidocaine 4% Topical Solution. A time out was conducted at 09:25, prior to the start of the procedure. A Minimum amount of bleeding was controlled with Pressure. The procedure was tolerated well with a pain level of 0 throughout and a pain level of 0 following the procedure. Post Debridement Measurements: 11cm length x 10.6cm width x 0.2cm depth; 18.315cm^3 volume. Post debridement Stage noted as Category/Stage II. Character of Wound/Ulcer Post Debridement requires further debridement. Severity of Tissue Post Debridement is: Fat layer exposed. Post procedure Diagnosis Wound #1: Same as Pre-Procedure Wound is Pressure ulcer of contiguous site of back, buttock (Gluteus) left and right and hip, stage 3 Plan Wound Cleansing: Wound #1 Gluteus: Clean  wound with Normal Saline. Cleanse wound with mild soap and water Anesthetic: Wound #1 Gluteus: Topical Lidocaine 4% cream applied to wound bed prior to debridement - clinic use Skin  Barriers/Peri-Wound Care: Rachel Butler, Rachel Butler ([id number]) Wound #1 Gluteus: Skin Prep Primary Wound Dressing: Wound #1 Gluteus: Santyl Ointment - go back to silver alginate only if pt cannot get the santyl Secondary Dressing: Wound #1 Gluteus: Dry Gauze Boardered Foam Dressing Dressing Change Frequency: Wound #1 Gluteus: Change dressing every other day. Follow-up Appointments: Wound #1 Gluteus: Return Appointment in 1 week. Off-Loading: Wound #1 Gluteus: Roho cushion for wheelchair - HHRN to order. Mattress - HHRN to order. Mattress appropriate for stage 3 wounds. Turn and reposition every 2 hours Additional Orders / Instructions: Wound #1 Gluteus: Stop Smoking Increase protein intake. Home Health: Wound #1 Gluteus: Continue Home Health Visits - [place] Nurse may visit PRN to address patient s wound care needs. FACE TO FACE ENCOUNTER: MEDICARE and MEDICAID PATIENTS: I certify that this patient is under my care and that I had a face-to-face encounter that meets the physician face-to-face encounter requirements with this patient on this date. The encounter with the patient was in whole or in part for the following MEDICAL CONDITION: (primary reason for [place]) MEDICAL NECESSITY: I certify, that based on my findings, NURSING services are a medically necessary home health service. HOME BOUND STATUS: I certify that my clinical findings support that this patient is homebound (i.e., Due to illness or injury, pt requires aid of supportive devices such as crutches, cane, wheelchairs, walkers, the use of special transportation or the assistance of another person to leave their place of residence. There is a normal inability to leave the home and doing so requires considerable and taxing effort. Other absences are for medical reasons / religious services and are infrequent or of short duration when for other reasons). If current dressing causes regression in wound  condition, may D/C ordered dressing product/s and apply Normal Saline Moist Dressing daily until next [place] / Other MD appointment. [place] of regression in wound condition at [phone]. Please direct any NON-WOUND related issues/requests for orders to patient's Primary Care Physician Medications-please add to medication list.: Wound #1 Gluteus: Santyl Enzymatic Ointment Other: - Vitamin C, Vitamin A, Zinc, MVI The following medication(s) was prescribed: Santyl topical 250 unit/gram ointment 90 ointment topical as directed starting 03/04/2017 Rachel Butler, Rachel Butler. ([id number]) After review have recommended: 1. Silver alginate and a bordered foam to be applied to the area and change daily. 2. offloading has been discussed in great detail with the patient and her husband 3. I have again discussed with them the need to completely give up smoking and have discussed the risks benefits and ways to quit smoking 4. Adequate protein, vitamin A, vitamin C and zinc 5. We'll try and obtain proper bed surface and Roho cushions for her recliner and seats. she and her husband have had all questions answered and will be compliant Electronic Signature(s) Signed: 03/18/2017 3:57:42 PM By: Christin Fudge MD, FACS Signed: 03/25/2017 10:32:27 AM By: Gretta Cool RN, BSN, Kim RN, BSN Previous Signature: 03/04/2017 9:35:21 AM Version By: Christin Fudge MD, FACS Entered By: Gretta Cool RN, BSN, Kim on 03/18/2017 09:35:49 Rachel Butler ([id number]) -------------------------------------------------------------------------------- [place] Details Patient Name: Rachel Butler, Rachel Butler. Date of Service: 03/04/2017 Medical Record Number: [id number] Patient Account Number: [id number] Date of Birth/Sex: May 20, 1956 (61 y.o. 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) F17.218 Nicotine dependence, cigarettes, with other nicotine-induced disorders Facility Procedures CPT4 Code Description: [id number] 11042 - DEB SUBQ TISSUE 20 SQ CM/< ICD-10 Description Diagnosis L89.43 Pressure ulcer of contiguous site of back, buttock E44.0 Moderate protein-calorie malnutrition D50.0 Iron deficiency anemia secondary to blood loss  (chr F17.218 Nicotine dependence, cigarettes, with other nicotin Modifier: and hip, sta onic) e-induced di Quantity: 1 ge 3 sorders Physician Procedures CPT4 Code Description: [id number] - WC PHYS SUBQ TISS 20 SQ CM ICD-10 Description Diagnosis L89.43 Pressure ulcer of contiguous site of back, buttock E44.0 Moderate protein-calorie malnutrition D50.0 Iron deficiency anemia secondary to blood loss (chr  F17.218 Nicotine dependence, cigarettes, with other nicotin Modifier: and hip, sta onic) e-induced di Quantity: 1 ge 3 sorders Electronic Signature(s) Signed: 03/04/2017 9:35:31 AM By: Christin Fudge MD, FACS Entered By: Christin Fudge on 03/04/2017 09:35:31

## 2017-03-06 DIAGNOSIS — G43909 Migraine, unspecified, not intractable, without status migrainosus: Secondary | ICD-10-CM | POA: Diagnosis not present

## 2017-03-06 DIAGNOSIS — D376 Neoplasm of uncertain behavior of liver, gallbladder and bile ducts: Secondary | ICD-10-CM | POA: Diagnosis not present

## 2017-03-06 DIAGNOSIS — G894 Chronic pain syndrome: Secondary | ICD-10-CM | POA: Diagnosis not present

## 2017-03-06 DIAGNOSIS — F17210 Nicotine dependence, cigarettes, uncomplicated: Secondary | ICD-10-CM | POA: Diagnosis not present

## 2017-03-06 DIAGNOSIS — M5136 Other intervertebral disc degeneration, lumbar region: Secondary | ICD-10-CM | POA: Diagnosis not present

## 2017-03-06 DIAGNOSIS — M5090 Cervical disc disorder, unspecified, unspecified cervical region: Secondary | ICD-10-CM | POA: Diagnosis not present

## 2017-03-08 ENCOUNTER — Inpatient Hospital Stay (HOSPITAL_BASED_OUTPATIENT_CLINIC_OR_DEPARTMENT_OTHER): Payer: Medicare Other | Admitting: Oncology

## 2017-03-08 ENCOUNTER — Inpatient Hospital Stay: Payer: Medicare Other

## 2017-03-08 ENCOUNTER — Encounter: Payer: Self-pay | Admitting: Oncology

## 2017-03-08 ENCOUNTER — Inpatient Hospital Stay: Payer: Medicare Other | Attending: Oncology

## 2017-03-08 VITALS — BP 112/71 | HR 89 | Temp 97.2°F | Resp 18 | Wt 125.1 lb

## 2017-03-08 DIAGNOSIS — K869 Disease of pancreas, unspecified: Secondary | ICD-10-CM | POA: Insufficient documentation

## 2017-03-08 DIAGNOSIS — R5381 Other malaise: Secondary | ICD-10-CM

## 2017-03-08 DIAGNOSIS — C801 Malignant (primary) neoplasm, unspecified: Secondary | ICD-10-CM | POA: Insufficient documentation

## 2017-03-08 DIAGNOSIS — C787 Secondary malignant neoplasm of liver and intrahepatic bile duct: Secondary | ICD-10-CM | POA: Insufficient documentation

## 2017-03-08 DIAGNOSIS — Z5111 Encounter for antineoplastic chemotherapy: Secondary | ICD-10-CM

## 2017-03-08 DIAGNOSIS — Z79899 Other long term (current) drug therapy: Secondary | ICD-10-CM

## 2017-03-08 DIAGNOSIS — B379 Candidiasis, unspecified: Secondary | ICD-10-CM

## 2017-03-08 DIAGNOSIS — R160 Hepatomegaly, not elsewhere classified: Secondary | ICD-10-CM

## 2017-03-08 DIAGNOSIS — G893 Neoplasm related pain (acute) (chronic): Secondary | ICD-10-CM

## 2017-03-08 DIAGNOSIS — R188 Other ascites: Secondary | ICD-10-CM

## 2017-03-08 DIAGNOSIS — I509 Heart failure, unspecified: Secondary | ICD-10-CM

## 2017-03-08 DIAGNOSIS — E878 Other disorders of electrolyte and fluid balance, not elsewhere classified: Secondary | ICD-10-CM | POA: Diagnosis not present

## 2017-03-08 DIAGNOSIS — I2510 Atherosclerotic heart disease of native coronary artery without angina pectoris: Secondary | ICD-10-CM | POA: Insufficient documentation

## 2017-03-08 DIAGNOSIS — R64 Cachexia: Secondary | ICD-10-CM

## 2017-03-08 DIAGNOSIS — Z809 Family history of malignant neoplasm, unspecified: Secondary | ICD-10-CM

## 2017-03-08 DIAGNOSIS — L89159 Pressure ulcer of sacral region, unspecified stage: Secondary | ICD-10-CM | POA: Insufficient documentation

## 2017-03-08 DIAGNOSIS — R42 Dizziness and giddiness: Secondary | ICD-10-CM | POA: Insufficient documentation

## 2017-03-08 DIAGNOSIS — Z880 Allergy status to penicillin: Secondary | ICD-10-CM

## 2017-03-08 DIAGNOSIS — R05 Cough: Secondary | ICD-10-CM | POA: Insufficient documentation

## 2017-03-08 DIAGNOSIS — D649 Anemia, unspecified: Secondary | ICD-10-CM

## 2017-03-08 DIAGNOSIS — C50919 Malignant neoplasm of unspecified site of unspecified female breast: Secondary | ICD-10-CM

## 2017-03-08 DIAGNOSIS — K8020 Calculus of gallbladder without cholecystitis without obstruction: Secondary | ICD-10-CM

## 2017-03-08 DIAGNOSIS — R531 Weakness: Secondary | ICD-10-CM

## 2017-03-08 DIAGNOSIS — R590 Localized enlarged lymph nodes: Secondary | ICD-10-CM | POA: Diagnosis not present

## 2017-03-08 DIAGNOSIS — K5900 Constipation, unspecified: Secondary | ICD-10-CM | POA: Insufficient documentation

## 2017-03-08 DIAGNOSIS — R600 Localized edema: Secondary | ICD-10-CM

## 2017-03-08 DIAGNOSIS — R5383 Other fatigue: Secondary | ICD-10-CM

## 2017-03-08 DIAGNOSIS — E871 Hypo-osmolality and hyponatremia: Secondary | ICD-10-CM | POA: Insufficient documentation

## 2017-03-08 DIAGNOSIS — I700 Atherosclerosis of aorta: Secondary | ICD-10-CM

## 2017-03-08 DIAGNOSIS — J449 Chronic obstructive pulmonary disease, unspecified: Secondary | ICD-10-CM

## 2017-03-08 DIAGNOSIS — F17210 Nicotine dependence, cigarettes, uncomplicated: Secondary | ICD-10-CM

## 2017-03-08 LAB — CBC WITH DIFFERENTIAL/PLATELET
BASOS ABS: 0 10*3/uL (ref 0–0.1)
Basophils Relative: 0 %
EOS PCT: 0 %
Eosinophils Absolute: 0 10*3/uL (ref 0–0.7)
HCT: 34.3 % — ABNORMAL LOW (ref 35.0–47.0)
HEMOGLOBIN: 11.4 g/dL — AB (ref 12.0–16.0)
LYMPHS PCT: 21 %
Lymphs Abs: 2.2 10*3/uL (ref 1.0–3.6)
MCH: 29.9 pg (ref 26.0–34.0)
MCHC: 33.1 g/dL (ref 32.0–36.0)
MCV: 90.1 fL (ref 80.0–100.0)
Monocytes Absolute: 0.8 10*3/uL (ref 0.2–0.9)
Monocytes Relative: 8 %
NEUTROS ABS: 7.3 10*3/uL — AB (ref 1.4–6.5)
NEUTROS PCT: 71 %
PLATELETS: 113 10*3/uL — AB (ref 150–440)
RBC: 3.81 MIL/uL (ref 3.80–5.20)
RDW: 28.3 % — AB (ref 11.5–14.5)
WBC: 10.3 10*3/uL (ref 3.6–11.0)

## 2017-03-08 LAB — COMPREHENSIVE METABOLIC PANEL
ALT: 17 U/L (ref 14–54)
AST: 28 U/L (ref 15–41)
Albumin: 1.7 g/dL — ABNORMAL LOW (ref 3.5–5.0)
Alkaline Phosphatase: 191 U/L — ABNORMAL HIGH (ref 38–126)
Anion gap: 8 (ref 5–15)
BUN: 11 mg/dL (ref 6–20)
CHLORIDE: 91 mmol/L — AB (ref 101–111)
CO2: 31 mmol/L (ref 22–32)
Calcium: 7 mg/dL — ABNORMAL LOW (ref 8.9–10.3)
Creatinine, Ser: 0.47 mg/dL (ref 0.44–1.00)
Glucose, Bld: 81 mg/dL (ref 65–99)
POTASSIUM: 3.4 mmol/L — AB (ref 3.5–5.1)
Sodium: 130 mmol/L — ABNORMAL LOW (ref 135–145)
Total Bilirubin: 1.1 mg/dL (ref 0.3–1.2)
Total Protein: 5.2 g/dL — ABNORMAL LOW (ref 6.5–8.1)

## 2017-03-08 LAB — MAGNESIUM: MAGNESIUM: 1 mg/dL — AB (ref 1.7–2.4)

## 2017-03-08 MED ORDER — SODIUM CHLORIDE 0.9 % IV SOLN
20.0000 mg | Freq: Once | INTRAVENOUS | Status: AC
  Administered 2017-03-08: 20 mg via INTRAVENOUS
  Filled 2017-03-08: qty 2

## 2017-03-08 MED ORDER — SODIUM CHLORIDE 0.9% FLUSH
10.0000 mL | Freq: Once | INTRAVENOUS | Status: AC
  Administered 2017-03-08: 10 mL via INTRAVENOUS
  Filled 2017-03-08: qty 10

## 2017-03-08 MED ORDER — FAMOTIDINE IN NACL 20-0.9 MG/50ML-% IV SOLN
20.0000 mg | Freq: Once | INTRAVENOUS | Status: AC
  Administered 2017-03-08: 20 mg via INTRAVENOUS
  Filled 2017-03-08: qty 50

## 2017-03-08 MED ORDER — PALONOSETRON HCL INJECTION 0.25 MG/5ML
0.2500 mg | Freq: Once | INTRAVENOUS | Status: AC
  Administered 2017-03-08: 0.25 mg via INTRAVENOUS
  Filled 2017-03-08: qty 5

## 2017-03-08 MED ORDER — SODIUM CHLORIDE 0.9 % IV SOLN
164.2000 mg | Freq: Once | INTRAVENOUS | Status: AC
  Administered 2017-03-08: 160 mg via INTRAVENOUS
  Filled 2017-03-08: qty 16

## 2017-03-08 MED ORDER — SODIUM CHLORIDE 0.9 % IV SOLN
Freq: Once | INTRAVENOUS | Status: AC
  Administered 2017-03-08: 11:00:00 via INTRAVENOUS
  Filled 2017-03-08: qty 100

## 2017-03-08 MED ORDER — HEPARIN SOD (PORK) LOCK FLUSH 100 UNIT/ML IV SOLN
500.0000 [IU] | Freq: Once | INTRAVENOUS | Status: AC
Start: 2017-03-08 — End: 2017-03-08
  Administered 2017-03-08: 500 [IU] via INTRAVENOUS
  Filled 2017-03-08: qty 5

## 2017-03-08 MED ORDER — DIPHENHYDRAMINE HCL 50 MG/ML IJ SOLN
50.0000 mg | Freq: Once | INTRAMUSCULAR | Status: AC
  Administered 2017-03-08: 50 mg via INTRAVENOUS
  Filled 2017-03-08: qty 1

## 2017-03-08 MED ORDER — DEXTROSE 5 % IV SOLN
60.0000 mg/m2 | Freq: Once | INTRAVENOUS | Status: AC
  Administered 2017-03-08: 90 mg via INTRAVENOUS
  Filled 2017-03-08: qty 15

## 2017-03-08 MED ORDER — SODIUM CHLORIDE 0.9 % IV SOLN
Freq: Once | INTRAVENOUS | Status: AC
  Administered 2017-03-08: 11:00:00 via INTRAVENOUS
  Filled 2017-03-08: qty 1000

## 2017-03-08 NOTE — Progress Notes (Signed)
Patient is here for follow up, states her pain medication is keeping her pain under control

## 2017-03-08 NOTE — Progress Notes (Signed)
Patient has been receiving carboplatin 160mg  and this visit will be cycle 3. Patient weight has fluctuated, mainly due to fluid from CHF. Calculated carbo dose is 187mg  which is more than 10% difference from precious administrations.   Called MD to clarify. MD states to keep the dose the same, 160mg , as the weight difference is mostly due to fluid weight.

## 2017-03-08 NOTE — Progress Notes (Signed)
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  1. Ultrasound showed multiple gallstones. Multiple solid hepatic lesions area and the largest measures 9.7 cm. Pancreatic mass appears to be present. Pancreatic solid lesions noted most likely adenopathy. Portal vein is narrowed possibly from periportal adenopathy.  2. MRI of the abdomen on 01/15/2017 showed enlargement of the liver and contains multiple ill-defined heterogeneous and peripheral enhancing lesions worrisome for multifocal metastatic disease. The largest lesion is in the medial segment of the left lobe measuring 6 cm. Lesion within segment 8 measures 3.2 cm segment 5 lesion measures 3.1 cm. The gallbladder fundus extends into the large mass within the medial segment of the left lobe. No biliary dilation. No inflammation or mass in the pancreas. Enlarged portocaval and porta hepatic lymph nodes worrisome for metastatic adenopathy.  3. Patient underwent ultrasound-guided liver biopsy which showed metastatic adenocarcinoma. Malignant cells are positive for CK 7 and GATA 3 as well as focal positive for estrogen. ER 40% positive PR negative. Cells are negative for CK 20, CDX2, TTF1, PAX8, napsin A, p53, WT 1 This is favored to represent breast primary  4. Post  liver biopsy patient had acute bleeding for about 2 hours and underwent Gelfoam embolization of the left hepatic artery. CTA of the abdomen did not reveal any acute or active intra-abdominal bleeding. Stable diffuse hepatic metastatic process. Stable perihepatic ascites and hemoperitoneum related to recent left hepatic mass biopsy. Intact abdominal wall without rectus sheath hematoma or hemorrhage. Pelvic ascites.  5. CTA of the chest showed no pulmonary embolus. Centrilobular emphysema. Secretions versus small pretracheal nodules. Short-term follow-up CT or bronchoscopy would be useful for further evaluation.   6. There was also a concern for endocarditis which was ruled out with TEE.   7. PET/CT from 02/04/17 showed: IMPRESSION: 1. Hypermetabolic hepatic metastatic disease with hypermetabolic gastrohepatic ligament, porta hepatis and retroperitoneal lymph nodes. 2. Moderate ascites. 3. Small bilateral pleural effusions. 4. Aortic atherosclerosis (ICD10-170.0). Coronary artery calcification. 5. Cholelithiasis.  8. Mammogram showed no breast mass.  MRI brain was negative for metastases  9. Patients case was discussed at tumor board and pathology from [facility] reviewed it for second opinion as well. IHC consistent with breast primary although no primary breast mass. Tissue of origin test sent for confirmation.  10.  Cancer Type ID revealed 90% probability of squamous cell carcinoma, likely from cervix (49%) or head/neck/skin (41%) and with 95% confidence ruled out breast cancer origin, among others.  Interval history- Patient feels more fatigued and weaker than last week. She is on 2L O2 with no shortness of breath. Pain meds continue to help with abdominal pain, although she reports 6-7/10 sacral pain related to pressure ulcers.  She was recently seen in the wound clinic and is being followed there for wounds on her sacrum and buttocks.  Per patient's husband, ulcers are starting to shrink  and improve  slightly.  Pain has not improved.  She reports the swelling in her abdomen and legs continues to improve. She reports that CHF clinic has her on lasix and potassium and told her to drink 40-50 oz fluids each day.  Her weight has stabilized after recent "water" weight loss.  Occasional headaches are relieved with Claritin.  Occasional dizziness.  Constipation is controlled with miralax and senna.  She is unable to perform her activities of daily living (ADLs), and is assisted by her husband.  ECOG PS- 3 Pain scale- 6 Opioid associated constipation- no  Review of systems- Review of Systems  Constitutional: Positive for malaise/fatigue. Negative for chills, fever and weight loss.  HENT: Negative for congestion, ear discharge and nosebleeds.        Thrush on tongue, mucous membranes  Eyes: Negative for blurred vision.  Respiratory: Positive for cough. Negative for hemoptysis, sputum production, shortness of breath and wheezing.        Cough at night with O2  Cardiovascular: Positive for leg swelling. Negative for chest pain, palpitations, orthopnea and claudication.  Gastrointestinal: Positive for abdominal pain, constipation and nausea. Negative for blood in stool, diarrhea, heartburn, melena and vomiting.       Nausea controlled with prn compazine and zofran  Genitourinary: Negative for dysuria, flank pain, frequency, hematuria and urgency.  Musculoskeletal: Negative for back pain, joint pain and myalgias.  Skin:       Sacral/gluteal pressure ulcers  Neurological: Positive for tingling and weakness. Negative for dizziness, focal weakness, seizures and headaches.       Chronic tingling in LUE x44yr  Endo/Heme/Allergies: Does not bruise/bleed easily.  Psychiatric/Behavioral: Negative for depression and suicidal ideas. The patient does not have insomnia.      Current treatment- weekly carboplatin and Taxol  Allergies  Allergen Reactions  . Indomethacin Other (See Comments)     Can not urinate  . Penicillins Rash  . Robaxin [Methocarbamol] Other (See Comments)    Urinary retention  . Nubain [Nalbuphine Hcl]      Past Medical History:  Diagnosis Date  . Anemia   . Arthritis   . Blood transfusion without reported diagnosis   . Cancer ([place]   . Cervical disc disease   . COPD (chronic obstructive pulmonary disease) ([place]   . Intervertebral disk disease   . Lumbar disc disease   . Migraines   . Nicotine dependence   . UTI (urinary tract infection)      Past Surgical History:  Procedure Laterality Date  . ABDOMINAL HYSTERECTOMY  1986   right tube and ovary removed--then laparoscopy after for ?retained cyst?  . CERVICAL DISCECTOMY  1993   C5-6  . CESAREAN SECTION  [id number] . COLONOSCOPY    . disk removal     c5c6  . IR GENERIC HISTORICAL  01/18/2017   IR EMBO ART  VEN HEMORR LYMPH EXTRAV  INC GUIDE ROADMAPPING 01/18/2017 MGreggory Keen MD [facility]  . IR GENERIC HISTORICAL  01/18/2017   IR ANGIOGRAM SELECTIVE EACH ADDITIONAL VESSEL 01/18/2017 MGreggory Keen MD [facility]  . IR GENERIC HISTORICAL  01/18/2017   IR ANGIOGRAM SELECTIVE EACH ADDITIONAL VESSEL 01/18/2017 MGreggory Keen MD [facility]  . IR GENERIC HISTORICAL  01/18/2017   IR [place] V[place] ACCESS RIGHT 01/18/2017 MGreggory Keen MD [facility]  . IR GENERIC HISTORICAL  01/18/2017   IR ANGIOGRAM VISCERAL SELECTIVE 01/18/2017 MGreggory Keen MD [facility]  . IR GENERIC HISTORICAL  01/18/2017   IR ANGIOGRAM SELECTIVE EACH ADDITIONAL  VESSEL 01/18/2017 Greggory Keen, MD [facility]  . IR GENERIC HISTORICAL  01/18/2017   IR ANGIOGRAM VISCERAL SELECTIVE 01/18/2017 Greggory Keen, MD [facility]  . IR GENERIC HISTORICAL  02/06/2017   IR FLUORO GUIDE PORT INSERTION RIGHT 02/06/2017 Marybelle Killings, MD [facility]INTERV RAD  . TEE WITHOUT CARDIOVERSION N/A 01/24/2017   Procedure: TRANSESOPHAGEAL ECHOCARDIOGRAM (TEE)  WITH ANESTHESIA;  Surgeon: Sueanne Margarita, MD;  Location: [facility];  Service:  Cardiovascular;  Laterality: N/A;  . [place]  . TUBAL LIGATION      Social History   Social History  . Marital status: Married    Spouse name: N/A  . Number of children: 2  . Years of education: N/A   Occupational History  . Social research officer, government ---retired   . 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 Smoking status: Current Every Day Smoker    Packs/day: 1.00    Years: 40.00    Types: Cigarettes  . Smokeless tobacco: Never Used  . Alcohol use No  . Drug use: No  . Sexual activity: Not on file   Other Topics Concern  . Not on file   Social History Narrative   [name] daughter      Has living will   Husband is health care POA   Would accept resuscitation but no prolonged ventilation   No tube feeds if cognitively unaware    Family History  Problem Relation Age of Onset  . Cancer Mother   . COPD Father   . Heart disease Father   . Hypertension Father   . Thyroid disease Other   . Cancer Other     mother's side  . Heart disease Other     Dad's side  . Colon cancer Neg Hx      Current Outpatient Prescriptions:  .  BIOTIN 5000 PO, Take by mouth daily., Disp: , Rfl:  .  clotrimazole (MYCELEX) 10 MG troche, Take 1 lozenge (10 mg total) by mouth 5 (five) times daily., Disp: 50 lozenge, Rfl: 0 .  dexamethasone (DECADRON) 4 MG tablet, Take 2 tablets (8 mg total) by mouth daily. Start the day after chemotherapy for 2 days., Disp: 30 tablet, Rfl: 1 .  fentaNYL (DURAGESIC - DOSED MCG/HR) 25 MCG/HR patch, Place 25 mcg onto the skin every 3 (three) days. , Disp: , Rfl: 0 .  furosemide (LASIX) 20 MG tablet, Take 1 tablet (20 mg total) by mouth daily., Disp: 30 tablet, Rfl: 5 .  gabapentin (NEURONTIN) 800 MG tablet, Take 1 tablet (800 mg total) by mouth 3 (three) times daily., Disp: 90 tablet, Rfl: 11 .  lidocaine-prilocaine (EMLA) cream, Apply 1 application topically as needed. Apply small amount to port site at least 1 hour prior  to it being accessed, cover with plastic wrap, Disp: 30 g, Rfl: 1 .  Melatonin 2.5 MG CAPS, Take 1 capsule by mouth at bedtime., Disp: , Rfl:  .  metoprolol tartrate (LOPRESSOR) 25 MG tablet, Take 0.5 tablets (12.5 mg total) by mouth 2 (two) times daily., Disp: 60 tablet, Rfl: 0 .  Multiple Vitamins-Minerals (MULTIVITAMIN WITH MINERALS) tablet, Take 1 tablet by mouth daily., Disp: , Rfl:  .  ondansetron (ZOFRAN) 8 MG tablet, Take 1 tablet (8 mg total) by mouth 2 (two) times daily as needed for refractory nausea / vomiting. Start on day 3 after chemo., Disp: 30 tablet, Rfl: 1 .  oxyCODONE (OXYCONTIN) 10 mg 12 hr  tablet, Take 1 tablet (10 mg total) by mouth every 12 (twelve) hours., Disp: 60 tablet, Rfl: 0 .  Oxycodone HCl 10 MG TABS, Take 1 tablet (10 mg total) by mouth every 4 (four) hours as needed., Disp: 60 tablet, Rfl: 0 .  polyethylene glycol (MIRALAX / GLYCOLAX) packet, Take 17 g by mouth daily., Disp: 14 each, Rfl: 0 .  potassium chloride (K-DUR) 10 MEQ tablet, Take 1 tablet (10 mEq total) by mouth daily., Disp: 30 tablet, Rfl: 5 .  prochlorperazine (COMPAZINE) 10 MG tablet, Take 1 tablet (10 mg total) by mouth every 6 (six) hours as needed (Nausea or vomiting)., Disp: 30 tablet, Rfl: 1 .  promethazine (PHENERGAN) 25 MG tablet, TAKE 1/2 TO 1 TABLETS BY MOUTH 3 TIMES DAILY AS NEEDED FOR NAUSEA, Disp: 30 tablet, Rfl: 0 .  SANTYL ointment, , Disp: , Rfl:  .  sennosides-docusate sodium (SENOKOT-S) 8.6-50 MG tablet, Take 2 tablets by mouth daily., Disp: , Rfl:  .  tiZANidine (ZANAFLEX) 4 MG capsule, Take 4 mg by mouth 3 (three) times daily. Takes 1 tid and up to 3 tablets at bedtime to sleep, Disp: , Rfl:  .  LORazepam (ATIVAN) 0.5 MG tablet, Take 1 tablet (0.5 mg total) by mouth every 6 (six) hours as needed (Nausea or vomiting). (Patient not taking: Reported on 03/08/2017), Disp: 30 tablet, Rfl: 0 No current facility-administered medications for this visit.   Facility-Administered Medications  Ordered in Other Visits:  .  CARBOplatin (PARAPLATIN) 160 mg in sodium chloride 0.9 % 250 mL chemo infusion, 160 mg, Intravenous, Once, Sindy Guadeloupe, MD .  heparin lock flush 100 unit/mL, 500 Units, Intravenous, Once, Sindy Guadeloupe, MD .  PACLitaxel (TAXOL) 90 mg in dextrose 5 % 250 mL chemo infusion (</= 51m/m2), 60 mg/m2 (Treatment Plan Recorded), Intravenous, Once, ASindy Guadeloupe MD, Last Rate: 265 mL/hr at 03/08/17 1121, 90 mg at 03/08/17 1121 .  sodium chloride 0.9 % 100 mL with potassium chloride 20 mEq, magnesium sulfate 4 g infusion, , Intravenous, Once, ALucendia Herrlich NP, Last Rate: 50 mL/hr at 03/08/17 1040  Physical exam:  Vitals:   03/08/17 0911  BP: 112/71  Pulse: 89  Resp: 18  Temp: 97.2 F (36.2 C)  TempSrc: Tympanic  SpO2: 97%  Weight: 125 lb 1.6 oz (56.7 kg)   Physical Exam  Constitutional: She is oriented to person, place, and time.  Fatigued, cachectic, sitting in a wheelchair in no acute distress  HENT:  Head: Normocephalic and atraumatic.  Eyes: EOM are normal. Pupils are equal, round, and reactive to light.  Neck: Normal range of motion.  Cardiovascular: Normal rate, regular rhythm and normal heart sounds.   Pulmonary/Chest: Effort normal and breath sounds normal.  On 2L O2  Abdominal: Soft. Bowel sounds are normal.  Palpable hepatomegaly.  Ascites.  Musculoskeletal: She exhibits edema.  3+ pitting edema in BLE  Neurological: She is alert and oriented to person, place, and time.  Skin: Skin is warm and dry. No erythema.  Sacral stage III decubitus ulcer.     CMP Latest Ref Rng & Units 03/08/2017  Glucose 65 - 99 mg/dL 81  BUN 6 - 20 mg/dL 11  Creatinine 0.44 - 1.00 mg/dL 0.47  Sodium 135 - 145 mmol/L 130(L)  Potassium 3.5 - 5.1 mmol/L 3.4(L)  Chloride 101 - 111 mmol/L 91(L)  CO2 22 - 32 mmol/L 31  Calcium 8.9 - 10.3 mg/dL 7.0(L)  Total Protein 6.5 - 8.1 g/dL 5.2(L)  Total Bilirubin  0.3 - 1.2 mg/dL 1.1  Alkaline Phos 38 - 126 U/L 191(H)  AST  15 - 41 U/L 28  ALT 14 - 54 U/L 17   CBC Latest Ref Rng & Units 03/08/2017  WBC 3.6 - 11.0 K/uL 10.3  Hemoglobin 12.0 - 16.0 g/dL 11.4(L)  Hematocrit 35.0 - 47.0 % 34.3(L)  Platelets 150 - 440 K/uL 113(L)    No images are attached to the encounter.  US Venous Img Lower Bilateral  Result Date: 02/11/2017 CLINICAL DATA:  Lower extremity edema.  Metastatic breast carcinoma EXAM: BILATERAL LOWER EXTREMITY VENOUS DUPLEX ULTRASOUND TECHNIQUE: Gray-scale sonography with graded compression, as well as color Doppler and duplex ultrasound were performed to evaluate the lower extremity deep venous systems from the level of the common femoral vein and including the common femoral, femoral, profunda femoral, popliteal and calf veins including the posterior tibial, peroneal and gastrocnemius veins when visible. The superficial great saphenous vein was also interrogated. Spectral Doppler was utilized to evaluate flow at rest and with distal augmentation maneuvers in the common femoral, femoral and popliteal veins. COMPARISON:  None. FINDINGS: RIGHT LOWER EXTREMITY Common Femoral Vein: No evidence of thrombus. Normal compressibility, respiratory phasicity and response to augmentation. Saphenofemoral Junction: No evidence of thrombus. Normal compressibility and flow on color Doppler imaging. Profunda Femoral Vein: No evidence of thrombus. Normal compressibility and flow on color Doppler imaging. Femoral Vein: No evidence of thrombus. Normal compressibility, respiratory phasicity and response to augmentation. Popliteal Vein: No evidence of thrombus. Normal compressibility, respiratory phasicity and response to augmentation. Calf Veins: No evidence of thrombus in the areas visualized. Visualization less than optimal due to lower extremity edema. Normal compressibility and flow on color Doppler imaging. Superficial Great Saphenous Vein: No evidence of thrombus. Normal compressibility and flow on color Doppler imaging.  Venous Reflux:  None. Other Findings:  Extensive lower extremity edema. LEFT LOWER EXTREMITY Common Femoral Vein: No evidence of thrombus. Normal compressibility, respiratory phasicity and response to augmentation. Saphenofemoral Junction: No evidence of thrombus. Normal compressibility and flow on color Doppler imaging. Profunda Femoral Vein: No evidence of thrombus. Normal compressibility and flow on color Doppler imaging. Femoral Vein: No evidence of thrombus. Normal compressibility, respiratory phasicity and response to augmentation. Popliteal Vein: No evidence of thrombus. Normal compressibility, respiratory phasicity and response to augmentation. Calf Veins: No evidence of thrombus in visualized regions. Visualization less than optimal due to lower extremity edema. Normal compressibility and flow on color Doppler imaging. Superficial Great Saphenous Vein: No evidence of thrombus. Normal compressibility and flow on color Doppler imaging. Venous Reflux:  None. Other Findings:  Extensive lower extremity edema. IMPRESSION: No evidence of deep venous thrombosis in either lower extremity. Calf vein visualization less than optimal due to extensive lower extremity edema bilaterally. Electronically Signed   By: Lowella Grip III M.D.   On: 02/11/2017 12:12     Assessment and plan- Patient is a 61 y.o. 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  1. biotheranostics tests suggests SCC probably from cervix versus Head/ neck. No primary lesion noted on PET. Continue weekly carbo/taxol. Cycle # 4 of 3 weeks on, 1 week off starts today.  2.  Labs today: CBC with diff, CMP, Mg.    4. Week #1 (cycle 2) carboplatin/taxol today.  3 week 1 week off cycle  5. Chronic hyponatremia and hypochloremia- this improved slightly after normal  saline infusion. However,  concerns for volume overload and worsening CHF.  No extra fluids today.  She was seen in CHF clinic on 02/21/17 and she is on lasix and potassium. Potassium 3.4 today.  Infuse 20 mEq potassium in 176m NS today.  6. Hypomagnesemia-  Magnesium is 1.0 today.  Infuse 4 gm magnesium (along with potassium) today.  7. Anemia- ferritin high likely from malignancy. Serum iron has been low.  H&H: 11.4/34.3 today.  No feraheme today.   8. Neoplasm related pain- controlled with medications.  Continue oxycontin 183mpo BID, and oxycodone 1058m4h prn for breakthrough pain.    9. Sacral decubitus ulcers - continue with wound clinic for assessment and management.  10. Congestive heart failure: BNP 1418 on 02/12/17. Bilateral lower extremity 3+ pitting edema. Patient had been taking Lasix.  Continue to follow-up with CHF clinic for assessment and management.   11.  Thrush:  Resolved with clotrimazole troches.  12.  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  Treatment is palliative.  Patient wants to receive treatment and receive CPR, chest compressions, ventilation "up to a point".  She does not want to be kept alive if her situation is not reversible.  13.  RTC in 1 week for labs (CBC/d, CMP, Mg) and carbo/taxol.    14.  RTC in 2 weeks for MD assessment, labs (CBC/d, CMP, Mg, Ca27.29, Ca125), and carbo/taxol.    Visit Diagnosis 1. 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 Carcinoma of unknown primary ([place] 3. Encounter for antineoplastic chemotherapy   4. Neoplasm related pain   5. Chronic hyponatremia   6. Chronic congestive heart failure, unspecified heart failure type ([place]   I saw and evaluated the patient, participating in the key portions of the service and reviewing pertinent diagnostic studies and records.  I reviewed the nurse practitioner's note and agree with the findings and the  plan.  The assessment and plan were discussed with the patient.  Multiple questions were asked by the patient and answered.   AnnLucendia HerrlichP  03/08/17 11:40 AM

## 2017-03-09 LAB — CANCER ANTIGEN 27.29: CA 27.29: 212.6 U/mL — AB (ref 0.0–38.6)

## 2017-03-11 ENCOUNTER — Emergency Department: Payer: Medicare Other

## 2017-03-11 ENCOUNTER — Telehealth: Payer: Self-pay

## 2017-03-11 ENCOUNTER — Encounter: Payer: Medicare Other | Admitting: Surgery

## 2017-03-11 ENCOUNTER — Inpatient Hospital Stay
Admission: EM | Admit: 2017-03-11 | Discharge: 2017-03-13 | DRG: 291 | Disposition: A | Discharge status: Hospice | Payer: Medicare Other | Attending: Internal Medicine | Admitting: Internal Medicine

## 2017-03-11 DIAGNOSIS — J449 Chronic obstructive pulmonary disease, unspecified: Secondary | ICD-10-CM | POA: Diagnosis present

## 2017-03-11 DIAGNOSIS — D6481 Anemia due to antineoplastic chemotherapy: Secondary | ICD-10-CM | POA: Diagnosis not present

## 2017-03-11 DIAGNOSIS — L89153 Pressure ulcer of sacral region, stage 3: Secondary | ICD-10-CM | POA: Diagnosis present

## 2017-03-11 DIAGNOSIS — E871 Hypo-osmolality and hyponatremia: Secondary | ICD-10-CM | POA: Diagnosis not present

## 2017-03-11 DIAGNOSIS — E860 Dehydration: Secondary | ICD-10-CM | POA: Diagnosis present

## 2017-03-11 DIAGNOSIS — R7989 Other specified abnormal findings of blood chemistry: Secondary | ICD-10-CM

## 2017-03-11 DIAGNOSIS — F17210 Nicotine dependence, cigarettes, uncomplicated: Secondary | ICD-10-CM | POA: Diagnosis present

## 2017-03-11 DIAGNOSIS — C801 Malignant (primary) neoplasm, unspecified: Secondary | ICD-10-CM | POA: Diagnosis not present

## 2017-03-11 DIAGNOSIS — D6959 Other secondary thrombocytopenia: Secondary | ICD-10-CM | POA: Diagnosis not present

## 2017-03-11 DIAGNOSIS — T451X5A Adverse effect of antineoplastic and immunosuppressive drugs, initial encounter: Secondary | ICD-10-CM | POA: Diagnosis present

## 2017-03-11 DIAGNOSIS — L8990 Pressure ulcer of unspecified site, unspecified stage: Secondary | ICD-10-CM | POA: Insufficient documentation

## 2017-03-11 DIAGNOSIS — Z515 Encounter for palliative care: Secondary | ICD-10-CM | POA: Diagnosis not present

## 2017-03-11 DIAGNOSIS — L89899 Pressure ulcer of other site, unspecified stage: Secondary | ICD-10-CM | POA: Diagnosis not present

## 2017-03-11 DIAGNOSIS — R5381 Other malaise: Secondary | ICD-10-CM | POA: Diagnosis present

## 2017-03-11 DIAGNOSIS — I509 Heart failure, unspecified: Secondary | ICD-10-CM

## 2017-03-11 DIAGNOSIS — Z66 Do not resuscitate: Secondary | ICD-10-CM

## 2017-03-11 DIAGNOSIS — E43 Unspecified severe protein-calorie malnutrition: Secondary | ICD-10-CM | POA: Diagnosis present

## 2017-03-11 DIAGNOSIS — T451X5S Adverse effect of antineoplastic and immunosuppressive drugs, sequela: Secondary | ICD-10-CM | POA: Diagnosis not present

## 2017-03-11 DIAGNOSIS — R778 Other specified abnormalities of plasma proteins: Secondary | ICD-10-CM | POA: Diagnosis present

## 2017-03-11 DIAGNOSIS — L89302 Pressure ulcer of unspecified buttock, stage 2: Secondary | ICD-10-CM | POA: Diagnosis not present

## 2017-03-11 DIAGNOSIS — Z9981 Dependence on supplemental oxygen: Secondary | ICD-10-CM | POA: Diagnosis not present

## 2017-03-11 DIAGNOSIS — Z79899 Other long term (current) drug therapy: Secondary | ICD-10-CM

## 2017-03-11 DIAGNOSIS — L89159 Pressure ulcer of sacral region, unspecified stage: Secondary | ICD-10-CM | POA: Diagnosis not present

## 2017-03-11 DIAGNOSIS — R64 Cachexia: Secondary | ICD-10-CM | POA: Diagnosis not present

## 2017-03-11 DIAGNOSIS — C50919 Malignant neoplasm of unspecified site of unspecified female breast: Secondary | ICD-10-CM | POA: Diagnosis present

## 2017-03-11 DIAGNOSIS — Z7189 Other specified counseling: Secondary | ICD-10-CM | POA: Diagnosis not present

## 2017-03-11 DIAGNOSIS — I209 Angina pectoris, unspecified: Secondary | ICD-10-CM | POA: Diagnosis not present

## 2017-03-11 DIAGNOSIS — R531 Weakness: Secondary | ICD-10-CM | POA: Diagnosis not present

## 2017-03-11 DIAGNOSIS — I5033 Acute on chronic diastolic (congestive) heart failure: Secondary | ICD-10-CM | POA: Diagnosis not present

## 2017-03-11 DIAGNOSIS — J9691 Respiratory failure, unspecified with hypoxia: Secondary | ICD-10-CM | POA: Diagnosis present

## 2017-03-11 DIAGNOSIS — Z716 Tobacco abuse counseling: Secondary | ICD-10-CM | POA: Diagnosis not present

## 2017-03-11 DIAGNOSIS — Z6823 Body mass index (BMI) 23.0-23.9, adult: Secondary | ICD-10-CM | POA: Diagnosis not present

## 2017-03-11 DIAGNOSIS — C787 Secondary malignant neoplasm of liver and intrahepatic bile duct: Secondary | ICD-10-CM | POA: Diagnosis present

## 2017-03-11 DIAGNOSIS — J9611 Chronic respiratory failure with hypoxia: Secondary | ICD-10-CM | POA: Diagnosis not present

## 2017-03-11 DIAGNOSIS — C229 Malignant neoplasm of liver, not specified as primary or secondary: Secondary | ICD-10-CM | POA: Diagnosis not present

## 2017-03-11 DIAGNOSIS — E46 Unspecified protein-calorie malnutrition: Secondary | ICD-10-CM | POA: Diagnosis not present

## 2017-03-11 DIAGNOSIS — I5031 Acute diastolic (congestive) heart failure: Secondary | ICD-10-CM | POA: Diagnosis not present

## 2017-03-11 DIAGNOSIS — E8809 Other disorders of plasma-protein metabolism, not elsewhere classified: Secondary | ICD-10-CM | POA: Diagnosis not present

## 2017-03-11 DIAGNOSIS — R634 Abnormal weight loss: Secondary | ICD-10-CM | POA: Diagnosis not present

## 2017-03-11 HISTORY — DX: Heart failure, unspecified: I50.9

## 2017-03-11 LAB — URINALYSIS, ROUTINE W REFLEX MICROSCOPIC
Bilirubin Urine: NEGATIVE
Glucose, UA: NEGATIVE mg/dL
KETONES UR: NEGATIVE mg/dL
NITRITE: NEGATIVE
PH: 5 (ref 5.0–8.0)
Protein, ur: 30 mg/dL — AB
Specific Gravity, Urine: 1.015 (ref 1.005–1.030)

## 2017-03-11 LAB — COMPREHENSIVE METABOLIC PANEL
ALT: 23 U/L (ref 14–54)
ANION GAP: 7 (ref 5–15)
AST: 55 U/L — ABNORMAL HIGH (ref 15–41)
Albumin: 1.6 g/dL — ABNORMAL LOW (ref 3.5–5.0)
Alkaline Phosphatase: 301 U/L — ABNORMAL HIGH (ref 38–126)
BILIRUBIN TOTAL: 0.9 mg/dL (ref 0.3–1.2)
BUN: 14 mg/dL (ref 6–20)
CALCIUM: 7.4 mg/dL — AB (ref 8.9–10.3)
CO2: 30 mmol/L (ref 22–32)
CREATININE: 0.64 mg/dL (ref 0.44–1.00)
Chloride: 93 mmol/L — ABNORMAL LOW (ref 101–111)
Glucose, Bld: 97 mg/dL (ref 65–99)
Potassium: 4.7 mmol/L (ref 3.5–5.1)
Sodium: 130 mmol/L — ABNORMAL LOW (ref 135–145)
TOTAL PROTEIN: 4.7 g/dL — AB (ref 6.5–8.1)

## 2017-03-11 LAB — CBC WITH DIFFERENTIAL/PLATELET
BASOS PCT: 0 %
Basophils Absolute: 0 10*3/uL (ref 0–0.1)
Eosinophils Absolute: 0 10*3/uL (ref 0–0.7)
Eosinophils Relative: 0 %
HEMATOCRIT: 25.8 % — AB (ref 35.0–47.0)
HEMOGLOBIN: 8.2 g/dL — AB (ref 12.0–16.0)
LYMPHS ABS: 1.2 10*3/uL (ref 1.0–3.6)
Lymphocytes Relative: 15 %
MCH: 29.6 pg (ref 26.0–34.0)
MCHC: 31.7 g/dL — ABNORMAL LOW (ref 32.0–36.0)
MCV: 93.3 fL (ref 80.0–100.0)
Monocytes Absolute: 0.1 10*3/uL — ABNORMAL LOW (ref 0.2–0.9)
Monocytes Relative: 1 %
NEUTROS ABS: 6.3 10*3/uL (ref 1.4–6.5)
NEUTROS PCT: 84 %
Platelets: 40 10*3/uL — ABNORMAL LOW (ref 150–440)
RBC: 2.76 MIL/uL — AB (ref 3.80–5.20)
RDW: 26.5 % — ABNORMAL HIGH (ref 11.5–14.5)
WBC: 7.5 10*3/uL (ref 3.6–11.0)

## 2017-03-11 LAB — LACTIC ACID, PLASMA: Lactic Acid, Venous: 1.3 mmol/L (ref 0.5–1.9)

## 2017-03-11 LAB — TROPONIN I: Troponin I: 0.11 ng/mL (ref <0.03)

## 2017-03-11 MED ORDER — TIZANIDINE HCL 4 MG PO TABS
4.0000 mg | ORAL_TABLET | Freq: Three times a day (TID) | ORAL | Status: DC
  Administered 2017-03-11 – 2017-03-13 (×7): 4 mg via ORAL
  Filled 2017-03-11 (×8): qty 1

## 2017-03-11 MED ORDER — ACETAMINOPHEN 325 MG PO TABS
650.0000 mg | ORAL_TABLET | Freq: Four times a day (QID) | ORAL | Status: DC | PRN
Start: 2017-03-11 — End: 2017-03-13

## 2017-03-11 MED ORDER — SODIUM CHLORIDE 0.9 % IV SOLN
1000.0000 mL | Freq: Once | INTRAVENOUS | Status: AC
  Administered 2017-03-11: 1000 mL via INTRAVENOUS

## 2017-03-11 MED ORDER — MELATONIN 5 MG PO TABS
2.5000 mg | ORAL_TABLET | Freq: Every day | ORAL | Status: DC
  Administered 2017-03-11 – 2017-03-12 (×2): 2.5 mg via ORAL
  Filled 2017-03-11 (×3): qty 0.5

## 2017-03-11 MED ORDER — BIOTIN 5 MG PO CAPS: ORAL_CAPSULE | Freq: Every day | ORAL | Status: DC

## 2017-03-11 MED ORDER — SENNOSIDES-DOCUSATE SODIUM 8.6-50 MG PO TABS
2.0000 | ORAL_TABLET | Freq: Every day | ORAL | Status: DC
  Administered 2017-03-12 – 2017-03-13 (×2): 2 via ORAL
  Filled 2017-03-11 (×3): qty 2

## 2017-03-11 MED ORDER — POLYETHYLENE GLYCOL 3350 17 G PO PACK
17.0000 g | PACK | Freq: Every day | ORAL | Status: DC
  Administered 2017-03-13: 17 g via ORAL
  Filled 2017-03-11: qty 1

## 2017-03-11 MED ORDER — ONDANSETRON HCL 4 MG/2ML IJ SOLN: 4.0000 mg | Freq: Four times a day (QID) | INTRAMUSCULAR | Status: DC | PRN

## 2017-03-11 MED ORDER — SENNOSIDES-DOCUSATE SODIUM 8.6-50 MG PO TABS: 1.0000 | ORAL_TABLET | Freq: Every evening | ORAL | Status: DC | PRN

## 2017-03-11 MED ORDER — FUROSEMIDE 10 MG/ML IJ SOLN
20.0000 mg | Freq: Two times a day (BID) | INTRAMUSCULAR | Status: DC
  Administered 2017-03-11 – 2017-03-13 (×4): 20 mg via INTRAVENOUS
  Filled 2017-03-11 (×4): qty 2

## 2017-03-11 MED ORDER — HYDROCODONE-ACETAMINOPHEN 5-325 MG PO TABS: 1.0000 | ORAL_TABLET | ORAL | Status: DC | PRN

## 2017-03-11 MED ORDER — FENTANYL 25 MCG/HR TD PT72: 25.0000 ug | MEDICATED_PATCH | TRANSDERMAL | Status: DC

## 2017-03-11 MED ORDER — PROCHLORPERAZINE MALEATE 10 MG PO TABS
10.0000 mg | ORAL_TABLET | Freq: Four times a day (QID) | ORAL | Status: DC | PRN
  Filled 2017-03-11: qty 1

## 2017-03-11 MED ORDER — GABAPENTIN 400 MG PO CAPS
800.0000 mg | ORAL_CAPSULE | Freq: Three times a day (TID) | ORAL | Status: DC
  Administered 2017-03-11 – 2017-03-13 (×7): 800 mg via ORAL
  Filled 2017-03-11 (×7): qty 2

## 2017-03-11 MED ORDER — ACETAMINOPHEN 650 MG RE SUPP
650.0000 mg | Freq: Four times a day (QID) | RECTAL | Status: DC | PRN
Start: 2017-03-11 — End: 2017-03-13

## 2017-03-11 MED ORDER — DEXAMETHASONE 4 MG PO TABS
8.0000 mg | ORAL_TABLET | Freq: Every day | ORAL | Status: AC
Start: 2017-03-11 — End: 2017-03-13
  Filled 2017-03-11 (×2): qty 2

## 2017-03-11 MED ORDER — OXYCODONE HCL ER 10 MG PO T12A
10.0000 mg | EXTENDED_RELEASE_TABLET | Freq: Two times a day (BID) | ORAL | Status: DC
  Administered 2017-03-11 – 2017-03-13 (×5): 10 mg via ORAL
  Filled 2017-03-11 (×5): qty 1

## 2017-03-11 MED ORDER — ADULT MULTIVITAMIN W/MINERALS CH
1.0000 | ORAL_TABLET | Freq: Every day | ORAL | Status: DC
  Administered 2017-03-12 – 2017-03-13 (×2): 1 via ORAL
  Filled 2017-03-11 (×2): qty 1

## 2017-03-11 MED ORDER — POTASSIUM CHLORIDE CRYS ER 10 MEQ PO TBCR
10.0000 meq | EXTENDED_RELEASE_TABLET | Freq: Every day | ORAL | Status: DC
  Administered 2017-03-12 – 2017-03-13 (×2): 10 meq via ORAL
  Filled 2017-03-11 (×3): qty 1

## 2017-03-11 MED ORDER — CEFTRIAXONE SODIUM 1 G IJ SOLR
1.0000 g | INTRAMUSCULAR | Status: DC
  Administered 2017-03-11 – 2017-03-12 (×2): 1 g via INTRAVENOUS
  Filled 2017-03-11 (×4): qty 10

## 2017-03-11 MED ORDER — TIZANIDINE HCL 4 MG PO CAPS
4.0000 mg | ORAL_CAPSULE | Freq: Three times a day (TID) | ORAL | Status: DC
  Filled 2017-03-11 (×2): qty 1

## 2017-03-11 MED ORDER — ONDANSETRON HCL 4 MG PO TABS: 8.0000 mg | ORAL_TABLET | Freq: Two times a day (BID) | ORAL | Status: DC | PRN

## 2017-03-11 MED ORDER — ONDANSETRON HCL 4 MG PO TABS: 4.0000 mg | ORAL_TABLET | Freq: Four times a day (QID) | ORAL | Status: DC | PRN

## 2017-03-11 MED ORDER — GABAPENTIN 400 MG PO CAPS: 800.0000 mg | ORAL_CAPSULE | Freq: Three times a day (TID) | ORAL | Status: DC

## 2017-03-11 MED ORDER — OXYCODONE HCL 5 MG PO TABS
10.0000 mg | ORAL_TABLET | ORAL | Status: DC | PRN
  Administered 2017-03-11 – 2017-03-13 (×9): 10 mg via ORAL
  Filled 2017-03-11 (×9): qty 2

## 2017-03-11 MED ORDER — BISACODYL 5 MG PO TBEC
5.0000 mg | DELAYED_RELEASE_TABLET | Freq: Every day | ORAL | Status: DC | PRN
Start: 2017-03-11 — End: 2017-03-13

## 2017-03-11 MED ORDER — SODIUM CHLORIDE 0.9 % IV SOLN: INTRAVENOUS | Status: DC

## 2017-03-11 NOTE — Progress Notes (Signed)
Pharmacy Antibiotic Note  Rachel Butler is a 61 y.o. female admitted on 03/11/2017 with CHF exacerbation and UTI. Pharmacy has been consulted for ceftriaxone dosing.  Plan: Will order ceftriaxone 1gm IV every 24 hours.    Height: 5\' 3"  (160 cm) Weight: 135 lb (61.2 kg) IBW/kg (Calculated) : 52.4  Temp (24hrs), Avg:98.3 F (36.8 C), Min:98.3 F (36.8 C), Max:98.3 F (36.8 C)   Recent Labs Lab 03/08/17 0848 03/11/17 1058 03/11/17 1150  WBC 10.3  --  7.5  CREATININE 0.47 0.64  --   LATICACIDVEN  --  1.3  --     Estimated Creatinine Clearance: 63.4 mL/min (by C-G formula based on SCr of 0.64 mg/dL).    Allergies  Allergen Reactions  . Indomethacin Other (See Comments)    Can not urinate  . Penicillins Rash  . Robaxin [Methocarbamol] Other (See Comments)    Urinary retention  . Nubain [Nalbuphine Hcl]     Antimicrobials this admission: 4/16 ceftriaxone  >>   Dose adjustments this admission:  Microbiology results:  Thank you for allowing pharmacy to be a part of this patient's care.  Pernell Dupre, PharmD, BCPS Clinical Pharmacist 03/11/2017 4:04 PM

## 2017-03-11 NOTE — Progress Notes (Signed)
Family Meeting Note  Advance Directive:no  Today a meeting took place with the Patient.spouse  The following clinical team members were present during this meeting:MD  The following were discussed:Patient's diagnosis: Liver mets with likely underlying breast cancer undergoing chemotherapy Severe protein calorie malnutrition with generalized weakness Chronic hyponatremia  , Patient's progosis: < 12 months and Goals for treatment: Limited code  Additional follow-up to be provided: Palliative care consultation is requested. Chaplain consultation requested for advanced directives  Time spent during discussion:20 minutes  Rachel Person, MD

## 2017-03-11 NOTE — Telephone Encounter (Signed)
Stacy PT with [facility] left v/m; missed PT home visit due to pts appts. Stacy request to make up one appt. Per pt chart pt is presently admitted to [facility].

## 2017-03-11 NOTE — H&P (Signed)
[facility] NAME: Rachel Butler    MR#:  [id number]  DATE OF BIRTH:  07/16/1956  DATE OF ADMISSION:  03/11/2017  PRIMARY CARE PHYSICIAN: Viviana Simpler, MD   REQUESTING/REFERRING PHYSICIAN: dr Corky Downs  CHIEF COMPLAINT:   Generalized weakness HISTORY OF PRESENT ILLNESS:  Rachel Butler  is a 61 y.o. 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 Patient reports over the past months she has had increasing generalized weakness. She is currently receiving chemotherapy for her neoplasm. She went to see wound care center today where she was completely debilitated and was asked to come to the ER for further evaluation. Patient has no focal neurological deficits. Patient reports she is extremely weak. Patient denies chest pain, shortness of breath, PND or orthopnea. She has lower extremity edema. She was recently seen at the CHF clinic at the end of March. She is on Lasix.    PAST MEDICAL HISTORY:   Past Medical History:  Diagnosis Date  . Anemia   . Arthritis   . Blood transfusion without reported diagnosis   . Cancer ([place])   . Cervical disc disease   . CHF (congestive heart failure) ([place]) 03/11/2017  . COPD (chronic obstructive pulmonary disease) ([place])   . Intervertebral disk disease   . Lumbar disc disease   . Migraines   . Nicotine dependence   . UTI (urinary tract infection)     PAST SURGICAL HISTORY:   Past Surgical History:  Procedure Laterality Date  . ABDOMINAL HYSTERECTOMY  1986   right tube and ovary removed--then laparoscopy after for ?retained cyst?  . CERVICAL DISCECTOMY  1993   C5-6  . CESAREAN SECTION  [id number]  . COLONOSCOPY    . disk removal     [id number]  . IR GENERIC HISTORICAL  01/18/2017   IR EMBO ART  VEN HEMORR LYMPH EXTRAV  INC GUIDE  ROADMAPPING 01/18/2017 Greggory Keen, MD [facility]  . IR GENERIC HISTORICAL  01/18/2017   IR ANGIOGRAM SELECTIVE EACH ADDITIONAL VESSEL 01/18/2017 Greggory Keen, MD [facility]  . IR GENERIC HISTORICAL  01/18/2017   IR ANGIOGRAM SELECTIVE EACH ADDITIONAL VESSEL 01/18/2017 Greggory Keen, MD [facility]  . IR GENERIC HISTORICAL  01/18/2017   IR US GUIDE VASC ACCESS RIGHT 01/18/2017 Greggory Keen, MD [facility]  . IR GENERIC HISTORICAL  01/18/2017   IR ANGIOGRAM VISCERAL SELECTIVE 01/18/2017 Greggory Keen, MD [facility]  . IR GENERIC HISTORICAL  01/18/2017   IR ANGIOGRAM SELECTIVE EACH ADDITIONAL VESSEL 01/18/2017 Greggory Keen, MD [facility]  . IR GENERIC HISTORICAL  01/18/2017   IR ANGIOGRAM VISCERAL SELECTIVE 01/18/2017 Greggory Keen, MD [facility]  . IR GENERIC HISTORICAL  02/06/2017   IR FLUORO GUIDE PORT INSERTION RIGHT 02/06/2017 Marybelle Killings, MD [facility]  . TEE WITHOUT CARDIOVERSION N/A 01/24/2017   Procedure: TRANSESOPHAGEAL ECHOCARDIOGRAM (TEE)  WITH ANESTHESIA;  Surgeon: Sueanne Margarita, MD;  Location: [facility];  Service: Cardiovascular;  Laterality: N/A;  . [place]  . TUBAL LIGATION      SOCIAL HISTORY:   Social History  Substance Use Topics  . Smoking status: Current Every Day Smoker    Packs/day: 1.00    Years: 40.00    Types: Cigarettes  . Smokeless tobacco: Never Used  . Alcohol use No    FAMILY  HISTORY:   Family History  Problem Relation Age of Onset  . Cancer Mother   . COPD Father   . Heart disease Father   . Hypertension Father   . Thyroid disease Other   . Cancer Other     mother's side  . Heart disease Other     Dad's side  . Colon cancer Neg Hx     DRUG ALLERGIES:   Allergies  Allergen Reactions  . Indomethacin Other (See Comments)    Can not urinate  . Penicillins Rash  . Robaxin [Methocarbamol] Other (See Comments)    Urinary retention  . Nubain [Nalbuphine Hcl]     REVIEW OF SYSTEMS:    Review of Systems  Constitutional: Positive for malaise/fatigue and weight loss. Negative for chills and fever.  HENT: Negative.  Negative for ear discharge, ear pain, hearing loss, nosebleeds and sore throat.   Eyes: Negative.  Negative for blurred vision and pain.  Respiratory: Negative.  Negative for cough, hemoptysis, shortness of breath and wheezing.   Cardiovascular: Positive for leg swelling. Negative for chest pain and palpitations.  Gastrointestinal: Negative.  Negative for abdominal pain, blood in stool, diarrhea, nausea and vomiting.  Genitourinary: Negative.  Negative for dysuria.  Musculoskeletal: Negative.  Negative for back pain.  Skin: Negative.   Neurological: Positive for weakness. Negative for dizziness, tremors, speech change, focal weakness, seizures and headaches.  Endo/Heme/Allergies: Negative.  Does not bruise/bleed easily.  Psychiatric/Behavioral: Negative.  Negative for depression, hallucinations and suicidal ideas.    MEDICATIONS AT HOME:   Prior to Admission medications   Medication Sig Start Date End Date Taking? Authorizing Provider  BIOTIN 5000 PO Take 1 tablet by mouth daily.    Yes Historical Provider, MD  dexamethasone (DECADRON) 4 MG tablet Take 2 tablets (8 mg total) by mouth daily. Start the day after chemotherapy for 2 days. 02/04/17  Yes Sindy Guadeloupe, MD  fentaNYL (DURAGESIC - DOSED MCG/HR) 25 MCG/HR patch Place 25 mcg onto the skin every 3 (three) days.  12/13/14  Yes Historical Provider, MD  furosemide (LASIX) 20 MG tablet Take 1 tablet (20 mg total) by mouth daily. 02/21/17  Yes Alisa Graff, FNP  gabapentin (NEURONTIN) 800 MG tablet Take 1 tablet (800 mg total) by mouth 3 (three) times daily. 01/09/17  Yes Venia Carbon, MD  lidocaine-prilocaine (EMLA) cream Apply 1 application topically as needed. Apply small amount to port site at least 1 hour prior to it being accessed, cover with plastic wrap 01/31/17  Yes Sindy Guadeloupe, MD  LORazepam  (ATIVAN) 0.5 MG tablet Take 1 tablet (0.5 mg total) by mouth every 6 (six) hours as needed (Nausea or vomiting). 02/04/17  Yes Sindy Guadeloupe, MD  Melatonin 2.5 MG CAPS Take 1 capsule by mouth at bedtime.   Yes Historical Provider, MD  metoprolol tartrate (LOPRESSOR) 25 MG tablet Take 0.5 tablets (12.5 mg total) by mouth 2 (two) times daily. 01/24/17  Yes Shanker Kristeen Mans, MD  Multiple Vitamins-Minerals (MULTIVITAMIN WITH MINERALS) tablet Take 1 tablet by mouth daily.   Yes Historical Provider, MD  ondansetron (ZOFRAN) 8 MG tablet Take 1 tablet (8 mg total) by mouth 2 (two) times daily as needed for refractory nausea / vomiting. Start on day 3 after chemo. 02/04/17  Yes Sindy Guadeloupe, MD  oxyCODONE (OXYCONTIN) 10 mg 12 hr tablet Take 1 tablet (10 mg total) by mouth every 12 (twelve) hours. 02/22/17  Yes Lucendia Herrlich, NP  Oxycodone HCl 10  MG TABS Take 1 tablet (10 mg total) by mouth every 4 (four) hours as needed. 02/22/17  Yes Lucendia Herrlich, NP  polyethylene glycol (MIRALAX / GLYCOLAX) packet Take 17 g by mouth daily. 01/23/17  Yes Florencia Reasons, MD  potassium chloride (K-DUR) 10 MEQ tablet Take 1 tablet (10 mEq total) by mouth daily. 02/21/17 05/22/17 Yes Alisa Graff, FNP  prochlorperazine (COMPAZINE) 10 MG tablet Take 1 tablet (10 mg total) by mouth every 6 (six) hours as needed (Nausea or vomiting). 02/04/17  Yes Sindy Guadeloupe, MD  SANTYL ointment  03/04/17  Yes Historical Provider, MD  sennosides-docusate sodium (SENOKOT-S) 8.6-50 MG tablet Take 2 tablets by mouth daily.   Yes Historical Provider, MD  tiZANidine (ZANAFLEX) 4 MG capsule Take 4 mg by mouth 3 (three) times daily. Takes 1 tid and up to 3 tablets at bedtime to sleep   Yes Historical Provider, MD  clotrimazole (MYCELEX) 10 MG troche Take 1 lozenge (10 mg total) by mouth 5 (five) times daily. Patient not taking: Reported on 03/11/2017 02/15/17   Lequita Asal, MD  promethazine (PHENERGAN) 25 MG tablet TAKE 1/2 TO 1 TABLETS BY MOUTH 3 TIMES  DAILY AS NEEDED FOR NAUSEA Patient not taking: Reported on 03/11/2017 01/31/17   Jearld Fenton, NP      VITAL SIGNS:  Blood pressure 103/69, pulse 89, temperature 98.3 F (36.8 C), temperature source Oral, resp. rate 17, height 5\' 3"  (1.6 m), weight 61.2 kg (135 lb), SpO2 100 %.  PHYSICAL EXAMINATION:   Physical Exam  Constitutional: She is oriented to person, place, and time and well-developed, well-nourished, and in no distress. No distress.  Very frail and chronically ill-appearing  HENT:  Head: Normocephalic.  Eyes: No scleral icterus.  Neck: Normal range of motion. Neck supple. JVD present. No tracheal deviation present.  Cardiovascular: Normal rate, regular rhythm and normal heart sounds.  Exam reveals no gallop and no friction rub.   No murmur heard. Pulmonary/Chest: Effort normal and breath sounds normal. No respiratory distress. She has no wheezes. She has no rales. She exhibits no tenderness.  Abdominal: Soft. Bowel sounds are normal. She exhibits no distension and no mass. There is no tenderness. There is no rebound and no guarding.  Musculoskeletal: She exhibits edema. She exhibits no deformity.  She has 4-5 strength bilaterally in upper and lower extremities  Neurological: She is alert and oriented to person, place, and time.  Skin: Skin is warm. No rash noted. No erythema.  Psychiatric: Affect and judgment normal.      LABORATORY PANEL:   CBC  Recent Labs Lab 03/11/17 1150  WBC 7.5  HGB 8.2*  HCT 25.8*  PLT 40*   ------------------------------------------------------------------------------------------------------------------  Chemistries   Recent Labs Lab 03/08/17 0848 03/11/17 1058  NA 130* 130*  K 3.4* 4.7  CL 91* 93*  CO2 31 30  GLUCOSE 81 97  BUN 11 14  CREATININE 0.47 0.64  CALCIUM 7.0* 7.4*  MG 1.0*  --   AST 28 55*  ALT 17 23  ALKPHOS 191* 301*  BILITOT 1.1 0.9    ------------------------------------------------------------------------------------------------------------------  Cardiac Enzymes  Recent Labs Lab 03/11/17 1058  TROPONINI 0.11*   ------------------------------------------------------------------------------------------------------------------  RADIOLOGY:  Dg Chest Port 1 View  Result Date: 03/11/2017 CLINICAL DATA:  Weakness.  Liver and breast cancer. EXAM: PORTABLE CHEST 1 VIEW COMPARISON:  PET-CT 02/04/2017.  Chest x-ray 01/16/2017. FINDINGS: PowerPort catheter with tip projected over the right atrium. Cardiomegaly with mild bilateral interstitial prominence and  bilateral pleural effusions suggesting congestive heart failure. Other etiologies of active interstitial lung disease including pneumonitis and interstitial tumor spread cannot be excluded. No pneumothorax. Diffuse osteopenia. No acute bony abnormality . IMPRESSION: 1. PowerPort catheter noted with its tip projected over the right atrium. 2. Cardiomegaly with pulmonary venous congestion, bilateral interstitial prominence, and bilateral pleural effusions suggesting congestive heart failure. Other etiologies of interstitial disease cannot be completely excluded. Electronically Signed   By: Marcello Moores  Register   On: 03/11/2017 10:47    EKG:  Sinus rhythm no ST elevation or depression  IMPRESSION AND PLAN:   61 year old female with a history of liver metastasis with likely breast primary neoplasm, sacral decubitus ulcer, protein calorie malnutrition, diastolic heart failure and ongoing tobacco abuse who presents with generalized weakness.  1. Acute on chronic diastolic heart failure: Chest x-ray and examination is consistent with CHF exacerbation Start Lasix 20 IV twice a day Monitor daily weight with intake and output Monitor BMP daily Due to low blood pressure I will hold metoprolol.  2. Sacral decubitus ulcer present on admission: Wound care consult  3. Severe protein  calorie malnutition in the context of illness: Dietary consultation  4. Generalized weakness due to protein calorie malnutrition and underlying cancer Physical therapy consult  5. Liver metastases with likely breast primary neoplasm: Oncology consultation for prognosis Palliative care consultation requested  6. Chronic hyponatremia: Sodium is stable at 1:30 7. Acute on chronic anemia: Monitor CBC for need for blood transfusion  8. Thrombocytopenia due to chemotherapy Follow platelet count  9.Tobacco dependence: Patient is encouraged to quit smoking. Counseling was provided for 4 minutes.   Patient needs palliative care consultation. Overall prognosis poor  All the records are reviewed and case discussed with ED provider. Management plans discussed with the patient and she is in agreement  CODE STATUS: LIMITED  TOTAL TIME TAKING CARE OF THIS PATIENT: 45 minutes.    Jalaiyah Throgmorton M.D on 03/11/2017 at 12:53 PM  Between 7am to 6pm - Pager - [phone]  After 6pm go to [email or web address] - password [email or web address] [facility]  Office  [phone]  CC: Primary care physician; Viviana Simpler, MD

## 2017-03-11 NOTE — Care Management (Signed)
Admitted from home with CHF with underlying stage 4 breast cancer with liver metastasis.  Followed at wound care center for sacral decubitus.  Oncology consult and palliative consult pending. Current oxygen requirements acute.

## 2017-03-11 NOTE — Progress Notes (Signed)
PHARMACIST - PHYSICIAN ORDER COMMUNICATION  CONCERNING: P&T Medication Policy on Herbal Medications  DESCRIPTION:  This patient's order for:  Biotin Caps has been noted.  This product(s) is classified as an "herbal" or natural product. Due to a lack of definitive safety studies or FDA approval, nonstandard manufacturing practices, plus the potential risk of unknown drug-drug interactions while on inpatient medications, the Pharmacy and Therapeutics Committee does not permit the use of "herbal" or natural products of this type within [facility].   ACTION TAKEN: The pharmacy department is unable to verify this order at this time and your patient has been informed of this safety policy. Please reevaluate patient's clinical condition at discharge and address if the herbal or natural product(s) should be resumed at that time.

## 2017-03-11 NOTE — ED Triage Notes (Signed)
Pt to ED from home c/o weakness. Per pt she has no energy and feels extremely weak; currently being tx for stage 4 liver cancer and breast cancer. Pt alert and oriented in no acute distress at this time.

## 2017-03-11 NOTE — Telephone Encounter (Signed)
She is in the hospital--will need new referral upon discharge

## 2017-03-11 NOTE — Progress Notes (Signed)
Patient admitted to room 255 from ED. VSS, a&o x4. Heart monitor 40-12 placed on patient, verified with CCMD. Head to toe skin assessment completed with Romelle Starcher, RN - see details in flowsheet. No complaints at this time.

## 2017-03-11 NOTE — ED Notes (Signed)
Patient to Room 16.  Triage not initiated.  Jerrie RN aware.

## 2017-03-11 NOTE — ED Provider Notes (Signed)
[facility] Emergency Department Provider Note   ____________________________________________    I have reviewed the triage vital signs and the nursing notes.   HISTORY  Chief Complaint Weakness     HPI Rachel Butler is a 61 y.o. female Who presents with complaints of weakness. Patient reports metastatic breast cancer, received chemotherapy 3 days ago. Dr. Janese Banks is her oncologist. Patient was at wound clinic today receiving debridement for sacral decubitus ulcer and was so weak that it took several people to lift her and put her back in her wheelchair. She denies fevers or chills. She denies shortness of breath and does have a cough. No dysuria.   Past Medical History:  Diagnosis Date  . Anemia   . Arthritis   . Blood transfusion without reported diagnosis   . Cancer ([place])   . Cervical disc disease   . COPD (chronic obstructive pulmonary disease) ([place])   . Intervertebral disk disease   . Lumbar disc disease   . Migraines   . Nicotine dependence   . UTI (urinary tract infection)     Patient Active Problem List   Diagnosis Date Noted  . COPD (chronic obstructive pulmonary disease) ([place]) 02/25/2017  . Hypotension 02/22/2017  . Tobacco use 02/22/2017  . Decubitus ulcer of sacral region, stage 2 02/17/2017  . Cancer related pain 02/17/2017  . Thrush 02/17/2017  . Hypomagnesemia 02/17/2017  . 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 Anemia 02/08/2017  . Encounter for antineoplastic chemotherapy 02/08/2017  . Metastatic breast cancer ([place]) 01/29/2017  . Goals of care, counseling/discussion 01/28/2017  . Mitral valve mass   . Liver metastases ([place]) 01/16/2017  . Intermittent claudication ([place]) 01/09/2017  . Neuropathy ([place]) 12/29/2015  . Chronic diastolic heart failure ([place]) 08/22/2015  . Generalized edema 08/17/2015  . Preventative health care 12/20/2014  . Advance directive discussed with patient 12/20/2014  .  Mild malnutrition ([place]) 12/07/2014  . Chronic pain 12/05/2014  . Nicotine dependence   . Cervical disc disease   . Lumbar disc disease   . Migraines     Past Surgical History:  Procedure Laterality Date  . ABDOMINAL HYSTERECTOMY  1986   right tube and ovary removed--then laparoscopy after for ?retained cyst?  . CERVICAL DISCECTOMY  1993   C5-6  . CESAREAN SECTION  [id number]  . COLONOSCOPY    . disk removal     c5c6  . IR GENERIC HISTORICAL  01/18/2017   IR EMBO ART  VEN HEMORR LYMPH EXTRAV  INC GUIDE ROADMAPPING 01/18/2017 Greggory Keen, MD [facility]  . IR GENERIC HISTORICAL  01/18/2017   IR ANGIOGRAM SELECTIVE EACH ADDITIONAL VESSEL 01/18/2017 Greggory Keen, MD [facility]  . IR GENERIC HISTORICAL  01/18/2017   IR ANGIOGRAM SELECTIVE EACH ADDITIONAL VESSEL 01/18/2017 Greggory Keen, MD [facility]  . IR GENERIC HISTORICAL  01/18/2017   IR US GUIDE VASC ACCESS RIGHT 01/18/2017 Greggory Keen, MD [facility]  . IR GENERIC HISTORICAL  01/18/2017   IR ANGIOGRAM VISCERAL SELECTIVE 01/18/2017 Greggory Keen, MD [facility]  . IR GENERIC HISTORICAL  01/18/2017   IR ANGIOGRAM SELECTIVE EACH ADDITIONAL VESSEL 01/18/2017 Greggory Keen, MD [facility]  . IR GENERIC HISTORICAL  01/18/2017   IR ANGIOGRAM VISCERAL SELECTIVE 01/18/2017 Greggory Keen, MD [facility]  . IR GENERIC HISTORICAL  02/06/2017   IR FLUORO GUIDE PORT INSERTION RIGHT 02/06/2017 Marybelle Killings, MD [facility]  . TEE WITHOUT CARDIOVERSION N/A 01/24/2017   Procedure: TRANSESOPHAGEAL ECHOCARDIOGRAM (TEE)  WITH ANESTHESIA;  Surgeon: Sueanne Margarita, MD;  Location: [place];  Service: Cardiovascular;  Laterality: N/A;  . [place]  . TUBAL LIGATION      Prior to Admission medications   Medication Sig Start Date End Date Taking? Authorizing Provider  BIOTIN 5000 PO Take 1 tablet by mouth daily.    Yes Historical Provider, MD  dexamethasone (DECADRON) 4 MG tablet Take 2 tablets (8 mg total) by  mouth daily. Start the day after chemotherapy for 2 days. 02/04/17  Yes Sindy Guadeloupe, MD  fentaNYL (DURAGESIC - DOSED MCG/HR) 25 MCG/HR patch Place 25 mcg onto the skin every 3 (three) days.  12/13/14  Yes Historical Provider, MD  furosemide (LASIX) 20 MG tablet Take 1 tablet (20 mg total) by mouth daily. 02/21/17  Yes Alisa Graff, FNP  gabapentin (NEURONTIN) 800 MG tablet Take 1 tablet (800 mg total) by mouth 3 (three) times daily. 01/09/17  Yes Venia Carbon, MD  lidocaine-prilocaine (EMLA) cream Apply 1 application topically as needed. Apply small amount to port site at least 1 hour prior to it being accessed, cover with plastic wrap 01/31/17  Yes Sindy Guadeloupe, MD  LORazepam (ATIVAN) 0.5 MG tablet Take 1 tablet (0.5 mg total) by mouth every 6 (six) hours as needed (Nausea or vomiting). 02/04/17  Yes Sindy Guadeloupe, MD  Melatonin 2.5 MG CAPS Take 1 capsule by mouth at bedtime.   Yes Historical Provider, MD  metoprolol tartrate (LOPRESSOR) 25 MG tablet Take 0.5 tablets (12.5 mg total) by mouth 2 (two) times daily. 01/24/17  Yes Shanker Kristeen Mans, MD  Multiple Vitamins-Minerals (MULTIVITAMIN WITH MINERALS) tablet Take 1 tablet by mouth daily.   Yes Historical Provider, MD  ondansetron (ZOFRAN) 8 MG tablet Take 1 tablet (8 mg total) by mouth 2 (two) times daily as needed for refractory nausea / vomiting. Start on day 3 after chemo. 02/04/17  Yes Sindy Guadeloupe, MD  oxyCODONE (OXYCONTIN) 10 mg 12 hr tablet Take 1 tablet (10 mg total) by mouth every 12 (twelve) hours. 02/22/17  Yes Lucendia Herrlich, NP  Oxycodone HCl 10 MG TABS Take 1 tablet (10 mg total) by mouth every 4 (four) hours as needed. 02/22/17  Yes Lucendia Herrlich, NP  polyethylene glycol (MIRALAX / GLYCOLAX) packet Take 17 g by mouth daily. 01/23/17  Yes Florencia Reasons, MD  potassium chloride (K-DUR) 10 MEQ tablet Take 1 tablet (10 mEq total) by mouth daily. 02/21/17 05/22/17 Yes Alisa Graff, FNP  prochlorperazine (COMPAZINE) 10 MG tablet Take 1 tablet  (10 mg total) by mouth every 6 (six) hours as needed (Nausea or vomiting). 02/04/17  Yes Sindy Guadeloupe, MD  SANTYL ointment  03/04/17  Yes Historical Provider, MD  sennosides-docusate sodium (SENOKOT-S) 8.6-50 MG tablet Take 2 tablets by mouth daily.   Yes Historical Provider, MD  tiZANidine (ZANAFLEX) 4 MG capsule Take 4 mg by mouth 3 (three) times daily. Takes 1 tid and up to 3 tablets at bedtime to sleep   Yes Historical Provider, MD  clotrimazole (MYCELEX) 10 MG troche Take 1 lozenge (10 mg total) by mouth 5 (five) times daily. Patient not taking: Reported on 03/11/2017 02/15/17   Lequita Asal, MD  promethazine (PHENERGAN) 25 MG tablet TAKE 1/2 TO 1 TABLETS BY MOUTH 3 TIMES DAILY AS NEEDED FOR NAUSEA Patient not taking: Reported on 03/11/2017 01/31/17   Jearld Fenton, NP     Allergies Indomethacin; Penicillins; Robaxin [methocarbamol]; and  Nubain [nalbuphine hcl]  Family History  Problem Relation Age of Onset  . Cancer Mother   . COPD Father   . Heart disease Father   . Hypertension Father   . Thyroid disease Other   . Cancer Other     mother's side  . Heart disease Other     Dad's side  . Colon cancer Neg Hx     Social History Social History  Substance Use Topics  . Smoking status: Current Every Day Smoker    Packs/day: 1.00    Years: 40.00    Types: Cigarettes  . Smokeless tobacco: Never Used  . Alcohol use No    Review of Systems  Constitutional: No fever/chills Eyes: No visual changes.   Cardiovascular: Denies chest pain. Respiratory: Denies shortness of breath.positive cough Gastrointestinal: No abdominal pain.  No nausea, no vomiting.   Genitourinary: Negative for dysuria. Musculoskeletal: Negative for back pain. Skin: decubitus ulcer Neurological: Negative for headaches   10-point ROS otherwise negative.  ____________________________________________   PHYSICAL EXAM:  VITAL SIGNS: ED Triage Vitals  Enc Vitals Group     BP 03/11/17 1023 94/63      Pulse Rate 03/11/17 1023 89     Resp 03/11/17 1023 18     Temp 03/11/17 1023 98.3 F (36.8 C)     Temp Source 03/11/17 1023 Oral     SpO2 03/11/17 1023 93 %     Weight 03/11/17 1023 135 lb (61.2 kg)     Height 03/11/17 1023 5\' 3"  (1.6 m)     Head Circumference --      Peak Flow --      Pain Score 03/11/17 1021 7     Pain Loc --      Pain Edu? --      Excl. in [place]? --     Constitutional: Alert and oriented. No acute distress.cachectic and ill-appearing Eyes: Conjunctivae are normal.  Head: Atraumatic. Nose: No congestion/rhinnorhea. Mouth/Throat: Mucous membranes are moist.    Cardiovascular: Normal rate, regular rhythm. Grossly normal heart sounds.  Good peripheral circulation. Respiratory: Normal respiratory effort.  No retractions. Lungs CTAB. Gastrointestinal: Soft and nontender. No distention.  No CVA tenderness.  Musculoskeletal: bilateral 2+ edema  Warm and well perfused Neurologic:  Normal speech and language. No gross focal neurologic deficits are appreciated.  Skin:  Skin is warm, dry and intact. No rash noted. Psychiatric: Mood and affect are normal. Speech and behavior are normal.  ____________________________________________   LABS (all labs ordered are listed, but only abnormal results are displayed)  Labs Reviewed  COMPREHENSIVE METABOLIC PANEL - Abnormal; Notable for the following:       Result Value   Sodium 130 (*)    Chloride 93 (*)    Calcium 7.4 (*)    Total Protein 4.7 (*)    Albumin 1.6 (*)    AST 55 (*)    Alkaline Phosphatase 301 (*)    All other components within normal limits  TROPONIN I - Abnormal; Notable for the following:    Troponin I 0.11 (*)    All other components within normal limits  CBC WITH DIFFERENTIAL/PLATELET - Abnormal; Notable for the following:    RBC 2.76 (*)    Hemoglobin 8.2 (*)    HCT 25.8 (*)    MCHC 31.7 (*)    RDW 26.5 (*)    Platelets 40 (*)    Monocytes Absolute 0.1 (*)    All other components within normal  limits  LACTIC ACID, PLASMA  LACTIC ACID, PLASMA  CBC WITH DIFFERENTIAL/PLATELET  URINALYSIS, ROUTINE W REFLEX MICROSCOPIC   ____________________________________________  EKG  None ____________________________________________  RADIOLOGY  Chest x-ray no acute distress ____________________________________________   PROCEDURES  Procedure(s) performed: No    Critical Care performed: No ____________________________________________   INITIAL IMPRESSION / ASSESSMENT AND PLAN / ED COURSE  Pertinent labs & imaging results that were available during my care of the patient were reviewed by me and considered in my medical decision making (see chart for details).  Patient presents with weakness that is worse than her baseline. Recent chemotherapy. Lab work shows mild hyponatremia,hypoalbuminemia, elevated trop, likely related to myocardial strain. IV fluids given emergency Department, we will admit the patient for further management    ____________________________________________   FINAL CLINICAL IMPRESSION(S) / ED DIAGNOSES  Final diagnoses:  Dehydration  Generalized weakness  Elevated troponin      NEW MEDICATIONS STARTED DURING THIS VISIT:  New Prescriptions   No medications on file     Note:  This document was prepared using Dragon voice recognition software and may include unintentional dictation errors.    Lavonia Drafts, MD 03/11/17 1256

## 2017-03-12 DIAGNOSIS — R5381 Other malaise: Secondary | ICD-10-CM

## 2017-03-12 DIAGNOSIS — R531 Weakness: Secondary | ICD-10-CM

## 2017-03-12 DIAGNOSIS — D6959 Other secondary thrombocytopenia: Secondary | ICD-10-CM

## 2017-03-12 DIAGNOSIS — Z515 Encounter for palliative care: Secondary | ICD-10-CM

## 2017-03-12 DIAGNOSIS — Z79899 Other long term (current) drug therapy: Secondary | ICD-10-CM

## 2017-03-12 DIAGNOSIS — F609 Personality disorder, unspecified: Secondary | ICD-10-CM

## 2017-03-12 DIAGNOSIS — F17210 Nicotine dependence, cigarettes, uncomplicated: Secondary | ICD-10-CM

## 2017-03-12 DIAGNOSIS — I209 Angina pectoris, unspecified: Secondary | ICD-10-CM

## 2017-03-12 DIAGNOSIS — R634 Abnormal weight loss: Secondary | ICD-10-CM

## 2017-03-12 DIAGNOSIS — Z9981 Dependence on supplemental oxygen: Secondary | ICD-10-CM

## 2017-03-12 DIAGNOSIS — E8809 Other disorders of plasma-protein metabolism, not elsewhere classified: Secondary | ICD-10-CM

## 2017-03-12 DIAGNOSIS — C50919 Malignant neoplasm of unspecified site of unspecified female breast: Secondary | ICD-10-CM

## 2017-03-12 DIAGNOSIS — R5382 Chronic fatigue, unspecified: Secondary | ICD-10-CM

## 2017-03-12 DIAGNOSIS — R64 Cachexia: Secondary | ICD-10-CM

## 2017-03-12 DIAGNOSIS — K869 Disease of pancreas, unspecified: Secondary | ICD-10-CM

## 2017-03-12 DIAGNOSIS — C787 Secondary malignant neoplasm of liver and intrahepatic bile duct: Secondary | ICD-10-CM

## 2017-03-12 DIAGNOSIS — T451X5S Adverse effect of antineoplastic and immunosuppressive drugs, sequela: Secondary | ICD-10-CM

## 2017-03-12 DIAGNOSIS — L8990 Pressure ulcer of unspecified site, unspecified stage: Secondary | ICD-10-CM

## 2017-03-12 DIAGNOSIS — J9611 Chronic respiratory failure with hypoxia: Secondary | ICD-10-CM

## 2017-03-12 DIAGNOSIS — Z9221 Personal history of antineoplastic chemotherapy: Secondary | ICD-10-CM

## 2017-03-12 DIAGNOSIS — Z7189 Other specified counseling: Secondary | ICD-10-CM

## 2017-03-12 DIAGNOSIS — C801 Malignant (primary) neoplasm, unspecified: Secondary | ICD-10-CM

## 2017-03-12 DIAGNOSIS — Z809 Family history of malignant neoplasm, unspecified: Secondary | ICD-10-CM

## 2017-03-12 DIAGNOSIS — E871 Hypo-osmolality and hyponatremia: Secondary | ICD-10-CM

## 2017-03-12 DIAGNOSIS — J449 Chronic obstructive pulmonary disease, unspecified: Secondary | ICD-10-CM

## 2017-03-12 DIAGNOSIS — D6481 Anemia due to antineoplastic chemotherapy: Secondary | ICD-10-CM

## 2017-03-12 LAB — BASIC METABOLIC PANEL
ANION GAP: 5 (ref 5–15)
BUN: 13 mg/dL (ref 6–20)
CHLORIDE: 92 mmol/L — AB (ref 101–111)
CO2: 34 mmol/L — AB (ref 22–32)
CREATININE: 0.5 mg/dL (ref 0.44–1.00)
Calcium: 7.2 mg/dL — ABNORMAL LOW (ref 8.9–10.3)
GFR calc non Af Amer: >60 mL/min (ref >60)
Glucose, Bld: 123 mg/dL — ABNORMAL HIGH (ref 65–99)
POTASSIUM: 3.6 mmol/L (ref 3.5–5.1)
SODIUM: 131 mmol/L — AB (ref 135–145)

## 2017-03-12 LAB — CBC
HEMATOCRIT: 30.2 % — AB (ref 35.0–47.0)
HEMOGLOBIN: 9.6 g/dL — AB (ref 12.0–16.0)
MCH: 29.4 pg (ref 26.0–34.0)
MCHC: 31.8 g/dL — AB (ref 32.0–36.0)
MCV: 92.5 fL (ref 80.0–100.0)
Platelets: 50 10*3/uL — ABNORMAL LOW (ref 150–440)
RBC: 3.27 MIL/uL — AB (ref 3.80–5.20)
RDW: 25.9 % — ABNORMAL HIGH (ref 11.5–14.5)
WBC: 9.8 10*3/uL (ref 3.6–11.0)

## 2017-03-12 MED ORDER — SODIUM CHLORIDE 0.9% FLUSH
10.0000 mL | Freq: Two times a day (BID) | INTRAVENOUS | Status: DC
Start: 2017-03-12 — End: 2017-03-13
  Administered 2017-03-12 – 2017-03-13 (×3): 10 mL

## 2017-03-12 MED ORDER — SODIUM CHLORIDE 0.9% FLUSH: 10.0000 mL | INTRAVENOUS | Status: DC | PRN

## 2017-03-12 MED ORDER — COLLAGENASE 250 UNIT/GM EX OINT
TOPICAL_OINTMENT | Freq: Every day | CUTANEOUS | Status: DC
  Administered 2017-03-12 – 2017-03-13 (×2): via TOPICAL
  Filled 2017-03-12: qty 30

## 2017-03-12 NOTE — Progress Notes (Addendum)
Initial Nutrition Assessment  DOCUMENTATION CODES:   Severe malnutrition in context of chronic illness  INTERVENTION:  1. Magic cup TID with meals, each supplement provides 290 kcal and 9 grams of protein 2. Encourage PO intake, protein intake  NUTRITION DIAGNOSIS:   Malnutrition related to chronic illness as evidenced by severe depletion of muscle mass, severe depletion of body fat, severe fluid accumulation  GOAL:   Patient will meet greater than or equal to 90% of their needs  MONITOR:   PO intake, I & O's, Supplement acceptance, Labs, Weight trends  REASON FOR ASSESSMENT:   Consult Assessment of nutrition requirement/status  ASSESSMENT:   Rachel Butler  is a 61 y.o. 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  Spoke with Rachel Butler at bedside. Ate bacon, eggs, toast this morning - consumed 90% per chart. Patient states appetite is good right now. PTA she states she was consuming normally a sandwich for lunch and a meat + 2 veggies for dinner. Likely not meeting estimated needs. Encouraged pt to consume ONS and snacks between meals but patient was not interested in either. Claims a UBW of 105# or so - but states her weight usually fluctuates due to edema. Per chart - wt is up 17 kgs since last month.  Currently on Carboplatin/Taxol - Cycle #2 of 3 weeks on, 1 week off  Nutrition-Focused physical exam completed. Findings are severe fat depletion, moderate-severe muscle depletion, and severe edema.   Labs and medications reviewed: Na 131 Decadron, Glycolax, Senokot-S, Melatonin, MVI w/ Minerals  Diet Order:  Diet regular Room service appropriate? Yes; Fluid consistency: Thin  Skin:  Wound (see comment) (Unstagable to Coccyx)  Last BM:  03/12/2017  Height:   Ht Readings from Last 1 Encounters:   03/11/17 5\' 3"  (1.6 m)    Weight:   Wt Readings from Last 1 Encounters:  03/11/17 135 lb (61.2 kg)    Ideal Body Weight:  52.27 kg  BMI:  Body mass index is 23.91 kg/m.  Estimated Nutritional Needs:   Kcal:  1779-3903 calories  Protein:  74-95 gm  Fluid:  >/= 1.8L  EDUCATION NEEDS:   No education needs identified at this time  Rachel Butler. Rachel Poppell, MS, RD LDN Inpatient Clinical Dietitian Pager [phone]

## 2017-03-12 NOTE — Care Management (Signed)
Palliative care consult is pending.  Patient has been informed that further chemo would not be beneficial.

## 2017-03-12 NOTE — Consult Note (Addendum)
Hematology/Oncology Consult note [facility] [phone] [phone]  Patient Care Team: Venia Carbon, MD as PCP - General (Pediatrics) Alisa Graff, FNP as Nurse Practitioner (Family Medicine) Lequita Asal, MD as Consulting Physician (Hematology and Oncology)   Name of the patient: Rachel Butler  [id number]  09/25/1956    Reason for consult: h/o liver mets likely breast primary versus SCC from head and neck   Requesting physician: Dr. Bettey Costa  Date of visit:03/12/2017    History of presenting illness- patient is a 61 year old female who was diagnosed with liver metastasis is in February 2018. Patient had a liver biopsy and pathology showed poorly differentiated carcinoma likely breast primary. 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 PET CT scan did not reveal any confirmed source of primary site. Patient has been getting weekly chemotherapy with carboplatin and Taxol 3 weeks on and one-week off and has received 4 doses of chemotherapy so far. However her a slight performance status is poor and she has chronic fatigue, declining performance status, chronic hyponatremia and congestive heart failure. She also has decubitus wounds which have been slowly worsening and was seen by Dr. Con Memos from wound care clinic yesterday. He was concerned that her decubitus wounds were getting worse and in the face of declining performance status he recommended getting evaluation in the ER. Patient also has baseline hypoxic respiratory failure and is on 2 L of home oxygen.  ECOG PS- 3  Pain scale- 4   Review of systems- Review of Systems  Constitutional: Positive for malaise/fatigue and weight loss. Negative for chills and fever.  HENT: Negative for congestion, ear discharge and nosebleeds.   Eyes: Negative for blurred vision.    Respiratory: Positive for shortness of breath. Negative for cough, hemoptysis, sputum production and wheezing.   Cardiovascular: Negative for chest pain, palpitations, orthopnea and claudication.  Gastrointestinal: Negative for abdominal pain, blood in stool, constipation, diarrhea, heartburn, melena, nausea and vomiting.       Pain at the site of decubitus ulcer  Genitourinary: Negative for dysuria, flank pain, frequency, hematuria and urgency.  Musculoskeletal: Negative for back pain, joint pain and myalgias.  Skin: Negative for rash.  Neurological: Positive for weakness. Negative for dizziness, tingling, focal weakness, seizures and headaches.  Endo/Heme/Allergies: Does not bruise/bleed easily.  Psychiatric/Behavioral: Negative for depression and suicidal ideas. The patient does not have insomnia.     Allergies  Allergen Reactions  . Indomethacin Other (See Comments)    Can not urinate  . Penicillins Rash  . Robaxin [Methocarbamol] Other (See Comments)    Urinary retention  . Nubain [Nalbuphine Hcl]     Patient Active Problem List   Diagnosis Date Noted  . Pressure injury of skin 03/12/2017  . CHF (congestive heart failure) ([place]) 03/11/2017  . COPD (chronic obstructive pulmonary disease) ([place]) 02/25/2017  . Hypotension 02/22/2017  . Tobacco use 02/22/2017  . Decubitus ulcer of sacral region, stage 2 02/17/2017  . Cancer related pain 02/17/2017  . Thrush 02/17/2017  . Hypomagnesemia 02/17/2017  . 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 Anemia 02/08/2017  . Encounter for antineoplastic chemotherapy 02/08/2017  . Metastatic breast cancer ([place]) 01/29/2017  . Goals of care, counseling/discussion 01/28/2017  . Mitral valve mass   . Liver metastases ([place]) 01/16/2017  . Intermittent claudication ([place]) 01/09/2017  . Neuropathy ([place]) 12/29/2015  . Chronic  diastolic heart failure ([place]) 08/22/2015  . Generalized edema 08/17/2015  . Preventative health care  12/20/2014  . Advance directive discussed with patient 12/20/2014  . Mild malnutrition ([place]) 12/07/2014  . Chronic pain 12/05/2014  . Nicotine dependence   . Cervical disc disease   . Lumbar disc disease   . Migraines      Past Medical History:  Diagnosis Date  . Anemia   . Arthritis   . Blood transfusion without reported diagnosis   . Cancer ([place])   . Cervical disc disease   . CHF (congestive heart failure) ([place]) 03/11/2017  . COPD (chronic obstructive pulmonary disease) ([place])   . Intervertebral disk disease   . Lumbar disc disease   . Migraines   . Nicotine dependence   . UTI (urinary tract infection)      Past Surgical History:  Procedure Laterality Date  . ABDOMINAL HYSTERECTOMY  1986   right tube and ovary removed--then laparoscopy after for ?retained cyst?  . CERVICAL DISCECTOMY  1993   C5-6  . CESAREAN SECTION  [id number]  . COLONOSCOPY    . disk removal     c5c6  . IR GENERIC HISTORICAL  01/18/2017   IR EMBO ART  VEN HEMORR LYMPH EXTRAV  INC GUIDE ROADMAPPING 01/18/2017 Greggory Keen, MD [facility]  . IR GENERIC HISTORICAL  01/18/2017   IR ANGIOGRAM SELECTIVE EACH ADDITIONAL VESSEL 01/18/2017 Greggory Keen, MD [facility]  . IR GENERIC HISTORICAL  01/18/2017   IR ANGIOGRAM SELECTIVE EACH ADDITIONAL VESSEL 01/18/2017 Greggory Keen, MD [facility]  . IR GENERIC HISTORICAL  01/18/2017   IR US GUIDE VASC ACCESS RIGHT 01/18/2017 Greggory Keen, MD [facility]  . IR GENERIC HISTORICAL  01/18/2017   IR ANGIOGRAM VISCERAL SELECTIVE 01/18/2017 Greggory Keen, MD [facility]  . IR GENERIC HISTORICAL  01/18/2017   IR ANGIOGRAM SELECTIVE EACH ADDITIONAL VESSEL 01/18/2017 Greggory Keen, MD [facility]  . IR GENERIC HISTORICAL  01/18/2017   IR ANGIOGRAM VISCERAL SELECTIVE 01/18/2017 Greggory Keen, MD [facility]  . IR GENERIC HISTORICAL  02/06/2017   IR FLUORO GUIDE PORT INSERTION RIGHT 02/06/2017 Marybelle Killings, MD [facility]  . TEE WITHOUT CARDIOVERSION N/A 01/24/2017    Procedure: TRANSESOPHAGEAL ECHOCARDIOGRAM (TEE)  WITH ANESTHESIA;  Surgeon: Sueanne Margarita, MD;  Location: [facility];  Service: Cardiovascular;  Laterality: N/A;  . [place]  . TUBAL LIGATION      Social History   Social History  . Marital status: Married    Spouse name: N/A  . Number of children: 2  . Years of education: N/A   Occupational History  . Social research officer, government ---retired   . 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 Smoking status: Current Every Day Smoker    Packs/day: 1.00    Years: 40.00    Types: Cigarettes  . Smokeless tobacco: Never Used  . Alcohol use No  . Drug use: No  . Sexual activity: Not on file   Other Topics Concern  . Not on file   Social History Narrative   [name] daughter      Has living will   Husband is health care POA   Would accept resuscitation but no prolonged ventilation   No tube feeds if cognitively unaware     Family History  Problem Relation Age of Onset  . Cancer Mother   . COPD Father   . Heart disease Father   . Hypertension  Father   . Thyroid disease Other   . Cancer Other     mother's side  . Heart disease Other     Dad's side  . Colon cancer Neg Hx      Current Facility-Administered Medications:  .  acetaminophen (TYLENOL) tablet 650 mg, 650 mg, Oral, Q6H PRN **OR** acetaminophen (TYLENOL) suppository 650 mg, 650 mg, Rectal, Q6H PRN, Bettey Costa, MD .  bisacodyl (DULCOLAX) EC tablet 5 mg, 5 mg, Oral, Daily PRN, Bettey Costa, MD .  cefTRIAXone (ROCEPHIN) 1 g in dextrose 5 % 50 mL IVPB, 1 g, Intravenous, Q24H, Sheema M Hallaji, RPH, 1 g at 03/11/17 1720 .  collagenase (SANTYL) ointment, , Topical, Daily, Dustin Flock, MD .  dexamethasone (DECADRON) tablet 8 mg, 8 mg, Oral, Daily, Bettey Costa, MD .  Derrill Memo ON 03/13/2017] fentaNYL (DURAGESIC - dosed mcg/hr) patch 25 mcg, 25 mcg, Transdermal, Q72H, Sital Mody, MD .  furosemide (LASIX) injection 20 mg, 20 mg, Intravenous,  BID, Sital Mody, MD, 20 mg at 03/12/17 1027 .  gabapentin (NEURONTIN) capsule 800 mg, 800 mg, Oral, TID, Bettey Costa, MD, 800 mg at 03/12/17 1027 .  Melatonin TABS 2.5 mg, 2.5 mg, Oral, QHS, Bettey Costa, MD, 2.5 mg at 03/11/17 2156 .  multivitamin with minerals tablet 1 tablet, 1 tablet, Oral, Daily, Bettey Costa, MD, 1 tablet at 03/12/17 1027 .  ondansetron (ZOFRAN) tablet 4 mg, 4 mg, Oral, Q6H PRN **OR** ondansetron (ZOFRAN) injection 4 mg, 4 mg, Intravenous, Q6H PRN, Bettey Costa, MD .  ondansetron (ZOFRAN) tablet 8 mg, 8 mg, Oral, BID PRN, Bettey Costa, MD .  oxyCODONE (Oxy IR/ROXICODONE) immediate release tablet 10 mg, 10 mg, Oral, Q4H PRN, Bettey Costa, MD, 10 mg at 03/12/17 1027 .  oxyCODONE (OXYCONTIN) 12 hr tablet 10 mg, 10 mg, Oral, Q12H, Bettey Costa, MD, 10 mg at 03/12/17 0624 .  polyethylene glycol (MIRALAX / GLYCOLAX) packet 17 g, 17 g, Oral, Daily, Sital Mody, MD .  potassium chloride (K-DUR,KLOR-CON) CR tablet 10 mEq, 10 mEq, Oral, Daily, Sital Mody, MD, 10 mEq at 03/12/17 1027 .  prochlorperazine (COMPAZINE) tablet 10 mg, 10 mg, Oral, Q6H PRN, Bettey Costa, MD .  senna-docusate (Senokot-S) tablet 1 tablet, 1 tablet, Oral, QHS PRN, Bettey Costa, MD .  sennosides-docusate sodium (SENOKOT-S) 8.6-50 MG tablet 2 tablet, 2 tablet, Oral, Daily, Bettey Costa, MD, 2 tablet at 03/12/17 1029 .  sodium chloride flush (NS) 0.9 % injection 10-40 mL, 10-40 mL, Intracatheter, Q12H, Fritzi Mandes, MD, 10 mL at 03/12/17 1124 .  sodium chloride flush (NS) 0.9 % injection 10-40 mL, 10-40 mL, Intracatheter, PRN, Fritzi Mandes, MD .  tiZANidine (ZANAFLEX) tablet 4 mg, 4 mg, Oral, TID, Bettey Costa, MD, 4 mg at 03/12/17 1027   Physical exam:  Vitals:   03/11/17 1946 03/12/17 0409 03/12/17 0810 03/12/17 1108  BP: 115/68 121/62 (!) 152/79 (!) 148/81  Pulse: 84 (!) 110 (!) 131   Resp: 18 18 (!) 24 19  Temp: 98.1 F (36.7 C) 98.5 F (36.9 C)    TempSrc: Oral Oral    SpO2: 98% 96% 97%   Weight:      Height:        Physical Exam  Constitutional: She is oriented to person, place, and time.  Patient is fatigued and cachectic  HENT:  Head: Normocephalic and atraumatic.  Eyes: EOM are normal. Pupils are equal, round, and reactive to light.  Neck: Normal range of motion.  Cardiovascular: Normal rate, regular rhythm and normal heart  sounds.   Pulmonary/Chest: Effort normal and breath sounds normal.  Abdominal: Soft. Bowel sounds are normal.  Decubitus wound not examined today  Musculoskeletal: She exhibits edema.  Neurological: She is alert and oriented to person, place, and time.  Skin: Skin is warm and dry.       CMP Latest Ref Rng & Units 03/12/2017  Glucose 65 - 99 mg/dL 123(H)  BUN 6 - 20 mg/dL 13  Creatinine 0.44 - 1.00 mg/dL 0.50  Sodium 135 - 145 mmol/L 131(L)  Potassium 3.5 - 5.1 mmol/L 3.6  Chloride 101 - 111 mmol/L 92(L)  CO2 22 - 32 mmol/L 34(H)  Calcium 8.9 - 10.3 mg/dL 7.2(L)  Total Protein 6.5 - 8.1 g/dL -  Total Bilirubin 0.3 - 1.2 mg/dL -  Alkaline Phos 38 - 126 U/L -  AST 15 - 41 U/L -  ALT 14 - 54 U/L -   CBC Latest Ref Rng & Units 03/12/2017  WBC 3.6 - 11.0 K/uL 9.8  Hemoglobin 12.0 - 16.0 g/dL 9.6(L)  Hematocrit 35.0 - 47.0 % 30.2(L)  Platelets 150 - 440 K/uL 50(L)    @IMAGES @  US Venous Img Lower Bilateral  Result Date: 02/11/2017 CLINICAL DATA:  Lower extremity edema.  Metastatic breast carcinoma EXAM: BILATERAL LOWER EXTREMITY VENOUS DUPLEX ULTRASOUND TECHNIQUE: Gray-scale sonography with graded compression, as well as color Doppler and duplex ultrasound were performed to evaluate the lower extremity deep venous systems from the level of the common femoral vein and including the common femoral, femoral, profunda femoral, popliteal and calf veins including the posterior tibial, peroneal and gastrocnemius veins when visible. The superficial great saphenous vein was also interrogated. Spectral Doppler was utilized to evaluate flow at rest and with distal augmentation  maneuvers in the common femoral, femoral and popliteal veins. COMPARISON:  None. FINDINGS: RIGHT LOWER EXTREMITY Common Femoral Vein: No evidence of thrombus. Normal compressibility, respiratory phasicity and response to augmentation. Saphenofemoral Junction: No evidence of thrombus. Normal compressibility and flow on color Doppler imaging. Profunda Femoral Vein: No evidence of thrombus. Normal compressibility and flow on color Doppler imaging. Femoral Vein: No evidence of thrombus. Normal compressibility, respiratory phasicity and response to augmentation. Popliteal Vein: No evidence of thrombus. Normal compressibility, respiratory phasicity and response to augmentation. Calf Veins: No evidence of thrombus in the areas visualized. Visualization less than optimal due to lower extremity edema. Normal compressibility and flow on color Doppler imaging. Superficial Great Saphenous Vein: No evidence of thrombus. Normal compressibility and flow on color Doppler imaging. Venous Reflux:  None. Other Findings:  Extensive lower extremity edema. LEFT LOWER EXTREMITY Common Femoral Vein: No evidence of thrombus. Normal compressibility, respiratory phasicity and response to augmentation. Saphenofemoral Junction: No evidence of thrombus. Normal compressibility and flow on color Doppler imaging. Profunda Femoral Vein: No evidence of thrombus. Normal compressibility and flow on color Doppler imaging. Femoral Vein: No evidence of thrombus. Normal compressibility, respiratory phasicity and response to augmentation. Popliteal Vein: No evidence of thrombus. Normal compressibility, respiratory phasicity and response to augmentation. Calf Veins: No evidence of thrombus in visualized regions. Visualization less than optimal due to lower extremity edema. Normal compressibility and flow on color Doppler imaging. Superficial Great Saphenous Vein: No evidence of thrombus. Normal compressibility and flow on color Doppler imaging. Venous  Reflux:  None. Other Findings:  Extensive lower extremity edema. IMPRESSION: No evidence of deep venous thrombosis in either lower extremity. Calf vein visualization less than optimal due to extensive lower extremity edema bilaterally. Electronically Signed   By: Gwyndolyn Saxon  Jasmine December III M.D.   On: 02/11/2017 12:12   Dg Chest Port 1 View  Result Date: 03/11/2017 CLINICAL DATA:  Weakness.  Liver and breast cancer. EXAM: PORTABLE CHEST 1 VIEW COMPARISON:  PET-CT 02/04/2017.  Chest x-ray 01/16/2017. FINDINGS: PowerPort catheter with tip projected over the right atrium. Cardiomegaly with mild bilateral interstitial prominence and bilateral pleural effusions suggesting congestive heart failure. Other etiologies of active interstitial lung disease including pneumonitis and interstitial tumor spread cannot be excluded. No pneumothorax. Diffuse osteopenia. No acute bony abnormality . IMPRESSION: 1. PowerPort catheter noted with its tip projected over the right atrium. 2. Cardiomegaly with pulmonary venous congestion, bilateral interstitial prominence, and bilateral pleural effusions suggesting congestive heart failure. Other etiologies of interstitial disease cannot be completely excluded. Electronically Signed   By: Marcello Moores  Register   On: 03/11/2017 10:47    Assessment and plan- Patient is a 61 y.o. female with metastatic carcinoma and liver mets- unknown primary breast versus SCC  Despite receiving 4 cycles of chemotherapy patient has not had any improvement in her clinical status. Her decubitus wounds are getting worse and are now as large as 10 cm. She has hypoalbuminemia and chronic hyponatremia as well as congestive heart failure and her performance status is 3 at best. Also her tumor markers have remained consistently elevated indicating ypoor response to treatment although she has not had any interim scans yet. I discussed all with this with the patient and did not recommend further chemotherapy given her  fatigue and cachexia and declining performance status. Her prognosis is <6 months and I recommend palliative care consult and discussion about hospice to which patient is agreeable. Her husband is unlikely to be able to care for her at home and nursing home with hospice or hospice home are likely reasonable options.  I also discussed CODE status with her and she would like to think about it although she is leaning towards DNR/DNI.  Anemia/ thrombocytopenia- due to chemotherapy. Continue to monitor. Transfuse if H/H <7/21 or platelet count <10 or signs symptoms of bleeding   I will continue to follow this patient with you and assist with gaols of care discussions  Visit Diagnosis 1. Dehydration   2. Generalized weakness   3. Elevated troponin     Dr. Randa Evens, MD, MPH [facility] Pager- [phone] 03/12/2017

## 2017-03-12 NOTE — Progress Notes (Addendum)
Rachel Butler, Rachel Butler ([id number]) Visit Report for 03/11/2017 Arrival Information Details Patient Name: Rachel Butler, Rachel Butler. Date of Service: 03/11/2017 8:45 AM Medical Record Number: [id number] Patient Account Number: [id number] Date of Birth/Sex: 15-Nov-1956 (61 y.o. Female) Treating RN: Carolyne Fiscal, Debi Primary Care Jowana Thumma: Viviana Simpler Other Clinician: Referring Maribel Hadley: Viviana Simpler Treating Honi Name/Extender: Frann Rider in Treatment: 2 Visit Information History Since Last Visit All ordered tests and consults were completed: No Patient Arrived: Wheel Chair Added or deleted any medications: No Arrival Time: 08:46 Any new allergies or adverse reactions: No Accompanied By: husband Had a fall or experienced change in No Transfer Assistance: EasyPivot activities of daily living that may affect Patient Lift risk of falls: Patient Identification Verified: Yes Signs or symptoms of abuse/neglect since last No Secondary Verification Process Yes visito Completed: Hospitalized since last visit: No Patient Requires Transmission- No Has Dressing in Place as Prescribed: Yes Based Precautions: Pain Present Now: No Patient Has Alerts: No Electronic Signature(s) Signed: 03/11/2017 4:31:30 PM By: Alric Quan Entered By: Alric Quan on 03/11/2017 08:47:02 Rachel Butler ([id number]) -------------------------------------------------------------------------------- Clinic Level of Care Assessment Details Patient Name: Rachel Butler Date of Service: 03/11/2017 8:45 AM Medical Record Number: [id number] Patient Account Number: [id number] Date of Birth/Sex: 11-24-1956 (61 y.o. Female) Treating RN: Carolyne Fiscal, Debi Primary Care Ryot Burrous: Viviana Simpler Other Clinician: Referring Lex Linhares: Viviana Simpler Treating Isak Sotomayor/Extender: Frann Rider in Treatment: 2 Clinic Level of Care Assessment Items TOOL 4 Quantity Score X - Use when only an EandM is performed  on FOLLOW-UP visit 1 0 ASSESSMENTS - Nursing Assessment / Reassessment X - Reassessment of Co-morbidities (includes updates in patient status) 1 10 X - Reassessment of Adherence to Treatment Plan 1 5 ASSESSMENTS - Wound and Skin Assessment / Reassessment []  - Simple Wound Assessment / Reassessment - one wound 0 X - Complex Wound Assessment / Reassessment - multiple wounds 2 5 []  - Dermatologic / Skin Assessment (not related to wound area) 0 ASSESSMENTS - Focused Assessment []  - Circumferential Edema Measurements - multi extremities 0 []  - Nutritional Assessment / Counseling / Intervention 0 []  - Lower Extremity Assessment (monofilament, tuning fork, pulses) 0 []  - Peripheral Arterial Disease Assessment (using hand held doppler) 0 ASSESSMENTS - Ostomy and/or Continence Assessment and Care []  - Incontinence Assessment and Management 0 []  - Ostomy Care Assessment and Management (repouching, etc.) 0 PROCESS - Coordination of Care []  - Simple Patient / Family Education for ongoing care 0 X - Complex (extensive) Patient / Family Education for ongoing care 1 20 X - Staff obtains Programmer, systems, Records, Test Results / Process Orders 1 10 X - Staff telephones HHA, Nursing Homes / Clarify orders / etc 1 10 []  - Routine Transfer to another Facility (non-emergent condition) 0 Rachel Butler, Rachel Butler ([id number]) []  - Routine Hospital Admission (non-emergent condition) 0 []  - New Admissions / Biomedical engineer / Ordering NPWT, Apligraf, etc. 0 []  - Emergency Hospital Admission (emergent condition) 0 X - Simple Discharge Coordination 1 10 []  - Complex (extensive) Discharge Coordination 0 PROCESS - Special Needs []  - Pediatric / Minor Patient Management 0 []  - Isolation Patient Management 0 []  - Hearing / Language / Visual special needs 0 []  - Assessment of Community assistance (transportation, D/C planning, etc.) 0 []  - Additional assistance / Altered mentation 0 []  - Support Surface(s) Assessment  (bed, cushion, seat, etc.) 0 INTERVENTIONS - Wound Cleansing / Measurement []  - Simple Wound Cleansing - one wound 0 X - Complex Wound Cleansing - multiple wounds 2 5  X - Wound Imaging (photographs - any number of wounds) 1 5 []  - Wound Tracing (instead of photographs) 0 X - Simple Wound Measurement - one wound 1 5 []  - Complex Wound Measurement - multiple wounds 0 INTERVENTIONS - Wound Dressings []  - Small Wound Dressing one or multiple wounds 0 X - Medium Wound Dressing one or multiple wounds 1 15 []  - Large Wound Dressing one or multiple wounds 0 X - Application of Medications - topical 1 5 []  - Application of Medications - injection 0 INTERVENTIONS - Miscellaneous []  - External ear exam 0 Rachel Butler, Rachel Butler. ([id number]) []  - Specimen Collection (cultures, biopsies, blood, body fluids, etc.) 0 []  - Specimen(s) / Culture(s) sent or taken to Lab for analysis 0 X - Patient Transfer (multiple staff / Harrel Lemon Lift / Similar devices) 1 10 []  - Simple Staple / Suture removal (25 or less) 0 []  - Complex Staple / Suture removal (26 or more) 0 []  - Hypo / Hyperglycemic Management (close monitor of Blood Glucose) 0 []  - Ankle / Brachial Index (ABI) - do not check if billed separately 0 X - Vital Signs 1 5 Has the patient been seen at the hospital within the last three years: Yes Total Score: 130 Level Of Care: New/Established - Level 4 Electronic Signature(s) Signed: 03/11/2017 4:31:30 PM By: Alric Quan Entered By: Alric Quan on 03/11/2017 12:28:42 Rachel Butler ([id number]) -------------------------------------------------------------------------------- Encounter Discharge Information Details Patient Name: Rachel Butler Date of Service: 03/11/2017 8:45 AM Medical Record Number: [id number] Patient Account Number: [id number] Date of Birth/Sex: [birth year] (61 y.o. Female) Treating RN: Carolyne Fiscal, Debi Primary Care Shawneen Deetz: Viviana Simpler Other Clinician: Referring  Davielle Lingelbach: Viviana Simpler Treating Trenden Hazelrigg/Extender: Frann Rider in Treatment: 2 Encounter Discharge Information Items Discharge Pain Level: 0 Discharge Condition: Stable Ambulatory Status: Wheelchair Discharge Destination: Home Transportation: Private Auto Accompanied By: husband Schedule Follow-up Appointment: Yes Medication Reconciliation completed and provided to Patient/Care No Ada Holness: Provided on Clinical Summary of Care: 03/11/2017 Form Type Recipient Paper Patient TI Electronic Signature(s) Signed: 03/11/2017 9:34:38 AM By: Ruthine Dose Entered By: Ruthine Dose on 03/11/2017 09:34:38 Rachel Butler ([id number]) -------------------------------------------------------------------------------- General Visit Notes Details Patient Name: Rachel Butler Date of Service: 03/11/2017 8:45 AM Medical Record Number: [id number] Patient Account Number: [id number] Date of Birth/Sex: 17-Oct-1956 (61 y.o. Female) Treating RN: Carolyne Fiscal, Debi Primary Care Aries Townley: Viviana Simpler Other Clinician: Cornell Barman Referring Sally-Ann Cutbirth: Viviana Simpler Treating Anjenette Gerbino/Extender: Frann Rider in Treatment: 2 Notes Pt came in for visit and in the beginning of the visit her blood pressure was low. I took it with the dinamap and it was 63/44, I tried to get it manually and was unable to so I got Maudie Mercury, Therapist, sports and she was able to get a manual of 60/48. The pt stated that she felt fine. I let Dr. Con Memos know and he said as long as she was asymptomatic it was okay because she had low blood pressure in the past visits. Later I took it again by dinamap and it was 70/46. Her husband and I transferred her to the bed from her wheelchair and she was more shaky and unable to help as much as she had been doing before. I got the walker we have here and she did not like the walker. During the visit the pt was not like herself. She was tired, weak, and sleeping more than other visits. After  completing her dressing on her wound her husband and I sat her up and was going to  transfer her to her wheelchair. We stood her up and she started to look more pallor. The pt buckled her knees and we eased her to the floor because we could not hold up her weight. Pt went down with no injuries and I went and got Maudie Mercury, Therapist, sports and Dr. Con Memos. Dr. Con Memos suggested that pt go straight to the emergency room. Electronic Signature(s) Signed: 03/13/2017 4:32:50 PM By: Alric Quan Entered By: Alric Quan on 03/13/2017 16:32:50 Rachel Butler ([id number]) -------------------------------------------------------------------------------- Lower Extremity Assessment Details Patient Name: Rachel Butler Date of Service: 03/11/2017 8:45 AM Medical Record Number: [id number] Patient Account Number: [id number] Date of Birth/Sex: 11/18/1956 (61 y.o. Female) Treating RN: Carolyne Fiscal, Debi Primary Care Maddix Kliewer: Viviana Simpler Other Clinician: Referring Leevon Upperman: Viviana Simpler Treating Quilla Freeze/Extender: Frann Rider in Treatment: 2 Electronic Signature(s) Signed: 03/11/2017 4:31:30 PM By: Alric Quan Entered By: Alric Quan on 03/11/2017 09:06:37 Rachel Butler ([id number]) -------------------------------------------------------------------------------- Multi Wound Chart Details Patient Name: Rachel Butler Date of Service: 03/11/2017 8:45 AM Medical Record Number: [id number] Patient Account Number: [id number] Date of Birth/Sex: 22-Oct-1956 (61 y.o. Female) Treating RN: Carolyne Fiscal, Debi Primary Care Alem Fahl: Viviana Simpler Other Clinician: Referring Marchello Rothgeb: Viviana Simpler Treating Raeleen Winstanley/Extender: Frann Rider in Treatment: 2 Vital Signs Height(in): 63 Pulse(bpm): 96 Weight(lbs): 119 Blood Pressure 63/44 (mmHg): Body Mass Index(BMI): 21 Temperature(F): 97.7 Respiratory Rate 18 (breaths/min): Photos: [1:No Photos] [2:No Photos] [N/A:N/A] Wound  Location: [1:Gluteus] [2:Left Labia] [N/A:N/A] Wounding Event: [1:Pressure Injury] [2:Pressure Injury] [N/A:N/A] Primary Etiology: [1:Pressure Ulcer] [2:Pressure Ulcer] [N/A:N/A] Comorbid History: [1:Anemia, Chronic Obstructive Pulmonary Disease (COPD), Osteoarthritis, Neuropathy, Received Chemotherapy] [2:Anemia, Chronic Obstructive Pulmonary Disease (COPD), Osteoarthritis, Neuropathy, Received Chemotherapy] [N/A:N/A] Date Acquired: [1:01/18/2017] [2:03/11/2017] [N/A:N/A] Weeks of Treatment: [1:2] [2:0] [N/A:N/A] Wound Status: [1:Open] [2:Open] [N/A:N/A] Clustered Wound: [1:Yes] [2:No] [N/A:N/A] Clustered Quantity: [1:2] [2:N/A] [N/A:N/A] Measurements L x W x D 17.5x10.9x0.1 [2:1.5x0.7x0.1] [N/A:N/A] (cm) Area (cm) : [1:149.815] [2:0.825] [N/A:N/A] Volume (cm) : [1:14.981] [2:0.082] [N/A:N/A] % Reduction in Area: [1:-302.20%] [2:N/A] [N/A:N/A] % Reduction in Volume: -302.20% [2:N/A] [N/A:N/A] Classification: [1:Category/Stage II] [2:Category/Stage II] [N/A:N/A] Exudate Amount: [1:Large] [2:Large] [N/A:N/A] Exudate Type: [1:Serosanguineous] [2:Serous] [N/A:N/A] Exudate Color: [1:red, brown] [2:amber] [N/A:N/A] Foul Odor After [1:Yes] [2:Yes] [N/A:N/A] Cleansing: Odor Anticipated Due to No [2:No] [N/A:N/A] Product Use: Wound Margin: [1:Distinct, outline attached] [2:Distinct, outline attached N/A] Granulation Amount: Small (1-33%) None Present (0%) N/A Granulation Quality: Red N/A N/A Necrotic Amount: Large (67-100%) Large (67-100%) N/A Necrotic Tissue: Eschar, Adherent [place] N/A Exposed Structures: Fascia: No N/A N/A Fat Layer (Subcutaneous Tissue) Exposed: No Tendon: No Muscle: No Joint: No Bone: No Limited to Skin Breakdown Epithelialization: None None N/A Periwound Skin Texture: No Abnormalities Noted No Abnormalities Noted N/A Periwound Skin Maceration: Yes No Abnormalities Noted N/A Moisture: Periwound Skin Color: Erythema: Yes Erythema: Yes  N/A Rubor: Yes Erythema Location: Circumferential Circumferential N/A Temperature: No Abnormality No Abnormality N/A Tenderness on Yes Yes N/A Palpation: Wound Preparation: Ulcer Cleansing: Ulcer Cleansing: N/A Rinsed/Irrigated with Rinsed/Irrigated with Saline Saline Topical Anesthetic Topical Anesthetic Applied: Other: lidocaine Applied: Other: lidocaine 4% 4% Treatment Notes Wound #1 (Gluteus) 1. Cleansed with: Clean wound with Normal Saline 2. Anesthetic Topical Lidocaine 4% cream to wound bed prior to debridement 3. Peri-wound Care: Skin Prep 4. Dressing Applied: Aquacel Ag 5. Secondary Dressing Applied Bordered Foam Dressing Wound #2 (Left Labia) 1. Cleansed with: Clean wound with Normal Saline 2. Anesthetic Rachel Butler, Rachel Butler ([id number]) Topical Lidocaine 4% cream to wound bed prior to debridement 3. Peri-wound Care: Skin Prep 4. Dressing Applied: Aquacel Ag  5. Secondary Dressing Applied Bordered Foam Dressing Electronic Signature(s) Signed: 03/11/2017 9:33:03 AM By: Christin Fudge MD, FACS Entered By: Christin Fudge on 03/11/2017 09:33:03 Rachel Butler ([id number]) -------------------------------------------------------------------------------- [place] Details Patient Name: Rachel Butler, Rachel Butler Date of Service: 03/11/2017 8:45 AM Medical Record Number: [id number] Patient Account Number: [id number] Date of Birth/Sex: 05-09-1956 (61 y.o. Female) Treating RN: Carolyne Fiscal, Debi Primary Care Yuka Lallier: Viviana Simpler Other Clinician: Referring Berk Pilot: Viviana Simpler Treating Maeson Lourenco/Extender: Frann Rider in Treatment: 2 Active Inactive Electronic Signature(s) Signed: 03/26/2017 3:01:40 PM By: Gretta Cool RN, BSN, Kim RN, BSN Signed: 03/26/2017 4:42:06 PM By: Alric Quan Previous Signature: 03/11/2017 4:31:30 PM Version By: Alric Quan Entered By: Gretta Cool RN, BSN, Kim on 03/26/2017 15:01:39 Rachel Butler  ([id number]) -------------------------------------------------------------------------------- Pain Assessment Details Patient Name: Rachel Butler, Rachel Butler. Date of Service: 03/11/2017 8:45 AM Medical Record Number: [id number] Patient Account Number: [id number] Date of Birth/Sex: 26-Jun-1956 (61 y.o. Female) Treating RN: Carolyne Fiscal, Debi Primary Care Ayn Domangue: Viviana Simpler Other Clinician: Referring Dwyane Dupree: Viviana Simpler Treating Niyla Marone/Extender: Frann Rider in Treatment: 2 Active Problems Location of Pain Severity and Description of Pain Patient Has Paino No Site Locations With Dressing Change: No Pain Management and Medication Current Pain Management: Notes pt states that she took her pain medication this morning. Electronic Signature(s) Signed: 03/11/2017 4:31:30 PM By: Alric Quan Entered By: Alric Quan on 03/11/2017 08:48:06 Rachel Butler ([id number]) -------------------------------------------------------------------------------- Patient/Caregiver Education Details Patient Name: Rachel Butler Date of Service: 03/11/2017 8:45 AM Medical Record Number: [id number] Patient Account Number: [id number] Date of Birth/Gender: 03/02/1956 (61 y.o. Female) Treating RN: Carolyne Fiscal, Debi Primary Care Physician: Viviana Simpler Other Clinician: Referring Physician: Viviana Simpler Treating Physician/Extender: Frann Rider in Treatment: 2 Education Assessment Education Provided To: Patient Education Topics Provided Wound/Skin Impairment: Handouts: Other: change dressing as ordered Methods: Demonstration, Explain/Verbal Responses: State content correctly Electronic Signature(s) Signed: 03/11/2017 4:31:30 PM By: Alric Quan Entered By: Alric Quan on 03/11/2017 09:14:43 Rachel Butler ([id number]) -------------------------------------------------------------------------------- Wound Assessment Details Patient Name: Rachel Butler Date  of Service: 03/11/2017 8:45 AM Medical Record Number: [id number] Patient Account Number: [id number] Date of Birth/Sex: 14-Feb-1956 (61 y.o. Female) Treating RN: Carolyne Fiscal, Debi Primary Care Kyoko Elsea: Viviana Simpler Other Clinician: Referring Jamar Weatherall: Viviana Simpler Treating Jameyah Fennewald/Extender: Frann Rider in Treatment: 2 Wound Status Wound Number: 1 Primary Pressure Ulcer Etiology: Wound Location: Gluteus Wound Open Wounding Event: Pressure Injury Status: Date Acquired: 01/18/2017 Comorbid Anemia, Chronic Obstructive Pulmonary Weeks Of Treatment: 2 History: Disease (COPD), Osteoarthritis, Clustered Wound: Yes Neuropathy, Received Chemotherapy Photos Photo Uploaded By: Alric Quan on 03/11/2017 16:27:59 Wound Measurements Length: (cm) 17.5 % Reduction i Width: (cm) 10.9 % Reduction i Depth: (cm) 0.1 Epithelializa Clustered Quantity: 2 Tunneling: Area: (cm) 149.815 Undermining: Volume: (cm) 14.981 n Area: -302.2% n Volume: -302.2% tion: None No No Wound Description Classification: Category/Stage II Foul Odor Aft Wound Margin: Distinct, outline attached Due to Produc Exudate Amount: Large Slough/Fibrin Exudate Type: Serosanguineous Exudate Color: red, brown er Cleansing: Yes t Use: No o Yes Wound Bed Granulation Amount: Small (1-33%) Exposed Structure Granulation Quality: Red Fascia Exposed: No Necrotic Amount: Large (67-100%) Fat Layer (Subcutaneous Tissue) Exposed: No Rachel Butler, Rachel Butler ([id number]) Necrotic Quality: Eschar, Adherent Slough Tendon Exposed: No Muscle Exposed: No Joint Exposed: No Bone Exposed: No Limited to Skin Breakdown Periwound Skin Texture Texture Color No Abnormalities Noted: No No Abnormalities Noted: No Erythema: Yes Moisture Erythema Location: Circumferential No Abnormalities Noted: No Rubor: Yes Maceration: Yes Temperature / Pain Temperature: No Abnormality Tenderness on Palpation: Yes  Wound  Preparation Ulcer Cleansing: Rinsed/Irrigated with Saline Topical Anesthetic Applied: Other: lidocaine 4%, Electronic Signature(s) Signed: 03/11/2017 4:31:30 PM By: Alric Quan Entered By: Alric Quan on 03/11/2017 09:04:51 Rachel Butler ([id number]) -------------------------------------------------------------------------------- Wound Assessment Details Patient Name: Rachel Butler Date of Service: 03/11/2017 8:45 AM Medical Record Number: [id number] Patient Account Number: [id number] Date of Birth/Sex: [birth year] (61 y.o. 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 Wound Open Wounding Event: Pressure Injury Status: Date Acquired: 03/11/2017 Comorbid Anemia, Chronic Obstructive Pulmonary Weeks Of Treatment: 0 History: Disease (COPD), Osteoarthritis, Clustered Wound: No Neuropathy, Received Chemotherapy Photos Photo Uploaded By: Alric Quan on 03/11/2017 16:27:59 Wound Measurements Length: (cm) 1.5 Width: (cm) 0.7 Depth: (cm) 0.1 Area: (cm) 0.825 Volume: (cm) 0.082 % Reduction in Area: % Reduction in Volume: Epithelialization: None Tunneling: No Undermining: No Wound Description Classification: Category/Stage II Foul Odor Afte Wound Margin: Distinct, outline attached Due to Product Exudate Amount: Large Slough/Fibrino Exudate Type: Serous Exudate Color: amber r Cleansing: Yes Use: No Yes Wound Bed Granulation Amount: None Present (0%) Necrotic Amount: Large (67-100%) Necrotic Quality: [address] [place], Rachel Butler ([id number]) Periwound Skin Texture Texture Color No Abnormalities Noted: No No Abnormalities Noted: No Erythema: Yes Moisture Erythema Location: Circumferential No Abnormalities  Noted: No Temperature / Pain Temperature: No Abnormality Tenderness on Palpation: Yes Wound Preparation Ulcer Cleansing: Rinsed/Irrigated with Saline Topical Anesthetic Applied: Other: lidocaine 4%, Electronic Signature(s) Signed: 03/11/2017 4:31:30 PM By: Alric Quan Entered By: Alric Quan on 03/11/2017 09:04:39 Rachel Butler ([id number]) -------------------------------------------------------------------------------- Vitals Details Patient Name: Rachel Butler Date of Service: 03/11/2017 8:45 AM Medical Record Number: [id number] Patient Account Number: [id number] Date of Birth/Sex: 06/19/1956 (61 y.o. Female) Treating RN: Carolyne Fiscal, Debi Primary Care Cillian Gwinner: Viviana Simpler Other Clinician: Referring Kavin Weckwerth: Viviana Simpler Treating Malashia Kamaka/Extender: Frann Rider in Treatment: 2 Vital Signs Time Taken: 08:48 Temperature (F): 97.7 Height (in): 63 Pulse (bpm): 96 Weight (lbs): 119 Respiratory Rate (breaths/min): 18 Body Mass Index (BMI): 21.1 Blood Pressure (mmHg): 63/44 Reference Range: 80 - 120 mg / dl Notes taken manually and it was 60/48 then taken again with the dinamap and it was 70/46 I did let Dr, Con Memos know and he said as long as she is asymptomatic it was okay. Electronic Signature(s) Signed: 03/11/2017 4:31:30 PM By: Alric Quan Entered By: Alric Quan on 03/11/2017 08:55:28

## 2017-03-12 NOTE — Progress Notes (Signed)
[facility] NAME: Rachel Butler    MR#:  [id number]  DATE OF BIRTH:  07/16/1956  SUBJECTIVE:   Patient feels a lot better today. Husband in the room. Came in from the wound care center after she was found to be weak and debilitated. REVIEW OF SYSTEMS:   Review of Systems  Constitutional: Negative for chills, fever and weight loss.  HENT: Negative for ear discharge, ear pain and nosebleeds.   Eyes: Negative for blurred vision, pain and discharge.  Respiratory: Negative for sputum production, shortness of breath, wheezing and stridor.   Cardiovascular: Negative for chest pain, palpitations, orthopnea and PND.  Gastrointestinal: Negative for abdominal pain, diarrhea, nausea and vomiting.  Genitourinary: Negative for frequency and urgency.  Musculoskeletal: Negative for back pain and joint pain.  Neurological: Positive for weakness. Negative for sensory change, speech change and focal weakness.  Psychiatric/Behavioral: Negative for depression and hallucinations. The patient is not nervous/anxious.    Tolerating Diet: Yes Tolerating PT: Pending  DRUG ALLERGIES:   Allergies  Allergen Reactions  . Indomethacin Other (See Comments)    Can not urinate  . Penicillins Rash  . Robaxin [Methocarbamol] Other (See Comments)    Urinary retention  . Nubain [Nalbuphine Hcl]     VITALS:  Blood pressure (!) 148/81, pulse (!) 131, temperature 98.5 F (36.9 C), temperature source Oral, resp. rate 19, height 5\' 3"  (1.6 m), weight 61.2 kg (135 lb), SpO2 97 %.  PHYSICAL EXAMINATION:   Physical Exam  GENERAL:  61 y.o.-year-old patient lying in the bed with no acute distress. Appears chronically weak and ill EYES: Pupils equal, round, reactive to light and accommodation. No scleral icterus. Extraocular muscles intact. Alopecia HEENT: Head atraumatic, normocephalic. Oropharynx and nasopharynx clear.  NECK:  Supple, no jugular venous  distention. No thyroid enlargement, no tenderness.  LUNGS: Normal breath sounds bilaterally, no wheezing, rales, rhonchi. No use of accessory muscles of respiration.  CARDIOVASCULAR: S1, S2 normal. No murmurs, rubs, or gallops.  ABDOMEN: Soft, nontender, nondistended. Bowel sounds present. No organomegaly or mass.  EXTREMITIES chronic bilateral pitting edema 3+    NEUROLOGIC: Cranial nerves II through XII are intact. No focal Motor or sensory deficits b/l.   PSYCHIATRIC:  patient is alert and oriented x 3.  SKIN: Chronic decubitus ulcer present prior to admission  LABORATORY PANEL:  CBC  Recent Labs Lab 03/12/17 0519  WBC 9.8  HGB 9.6*  HCT 30.2*  PLT 50*    Chemistries   Recent Labs Lab 03/08/17 0848 03/11/17 1058 03/12/17 0519  NA 130* 130* 131*  K 3.4* 4.7 3.6  CL 91* 93* 92*  CO2 31 30 34*  GLUCOSE 81 97 123*  BUN 11 14 13   CREATININE 0.47 0.64 0.50  CALCIUM 7.0* 7.4* 7.2*  MG 1.0*  --   --   AST 28 55*  --   ALT 17 23  --   ALKPHOS 191* 301*  --   BILITOT 1.1 0.9  --    Cardiac Enzymes  Recent Labs Lab 03/11/17 1058  TROPONINI 0.11*   RADIOLOGY:  Dg Chest Port 1 View  Result Date: 03/11/2017 CLINICAL DATA:  Weakness.  Liver and breast cancer. EXAM: PORTABLE CHEST 1 VIEW COMPARISON:  PET-CT 02/04/2017.  Chest x-ray 01/16/2017. FINDINGS: PowerPort catheter with tip projected over the right atrium. Cardiomegaly with mild bilateral interstitial prominence and bilateral pleural effusions suggesting congestive heart failure. Other etiologies of active interstitial lung disease including  pneumonitis and interstitial tumor spread cannot be excluded. No pneumothorax. Diffuse osteopenia. No acute bony abnormality . IMPRESSION: 1. PowerPort catheter noted with its tip projected over the right atrium. 2. Cardiomegaly with pulmonary venous congestion, bilateral interstitial prominence, and bilateral pleural effusions suggesting congestive heart failure. Other etiologies of  interstitial disease cannot be completely excluded. 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  1. Acute on chronic diastolic heart failure: Chest x-ray and examination is consistent with CHF exacerbation Start Lasix 20 IV twice a day Monitor daily weight with intake and output Monitor BMP daily Due to low blood pressure  will hold metoprolol.  2. Sacral decubitus ulcer present on admission: Wound care consult-Further recommendation  3. Severe protein calorie malnutition in the context of illness: Dietary consultation  4. Generalized weakness due to protein calorie malnutrition and underlying cancer Physical therapy consult  5. Liver metastases with likely breast primary neoplasm: Oncology consultation appreciated. Patient will be on hold for further chemotherapy. She is currently not a candidate given her severe debilitation and weakness for further chemotherapy treatments per oncology -Oncology had a long conversation with patient and discussion was made for options about hospice home or  home with hospice versus long-term facility with hospice -Palliative consultation placed  6. Chronic hyponatremia: Sodium is stable at 130  7. UTI continued Rocephin  8. Thrombocytopenia due to chemotherapy Follow platelet count  9.Tobacco dependence: Patient is encouraged to quit smoking. Counseling was provided for 4 minutes.   Patient needs palliative care consultation. Overall prognosis poor  Case discussed with Care Management/Social Worker. Management plans discussed with the patient, family and they are in agreement.  CODE STATUS:   DVT Prophylaxis: SCD teds avoiding antiplatelet due to low  platelet count  TOTAL TIME TAKING CARE OF THIS PATIENT: 30 minutes.  >50% time spent on counselling and coordination of care  POSSIBLE D/C IN 1-2 DAYS, DEPENDING ON CLINICAL CONDITION.  Note: This dictation was prepared with Dragon dictation along with smaller phrase technology. Any transcriptional errors that result from this process are unintentional.  Taletha Twiford M.D on 03/12/2017 at 3:23 PM  Between 7am to 6pm - Pager - [phone]  After 6pm go to [email or web address] - password [email or web address] [facility]  Office  [phone]  CC: Primary care physician; Viviana Simpler, MD

## 2017-03-12 NOTE — Telephone Encounter (Signed)
Lm on Stacey's vm and advised per Dr Silvio Pate

## 2017-03-12 NOTE — Progress Notes (Signed)
[facility]  Patient Status: Active  [facility] is providing the following services: SN/PT  If patient discharges after hours, please call [phone].   Rachel Butler 03/12/2017, 10:37 AM

## 2017-03-12 NOTE — Consult Note (Signed)
Consultation Note Date: 03/12/2017   Patient Name: Rachel Butler  DOB: [birth year]  MRN: [id number]  Age / Sex: 61 y.o., female  PCP: Venia Carbon, MD Referring Physician: Fritzi Mandes, MD  Reason for Consultation: Establishing goals of care d/t significant decline with recent cancer diagnosis.   HPI/Patient Profile: 61 y.o. 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   Clinical Assessment and Goals of Care: I met today with Ms. Winiarski and her husband at bedside. We had a frank conversation about her current cancer and approaching EOL. They are understandably struggling with this. She says she is most concerned with her daughter and how she will handle this news and poor prognosis. She is worried about her husband.   We discuss prognosis which is difficult to say at this point but my best guess would be months or less. We discussed options from here from hospice at home, SNF with palliative, and hospice facility (not a candidate for hospice facility currently). Her goal would be to return home and be there as long as possible. She will talk with her husband about how realistic this is for them. I explained hospice support at home and that this would be their best option to make this work and provide maximum support for them. Ms. Quilling is scared of hospice but after discussion is more willing to work with them recognizing the support they provide at this juncture of her disease trajectory.   Ms. Eichholz says she has a fairly realistic approach to dying. Says her grandmother taught her at a young age "I you die from drowning, you will never have to worry about hanging." We were able to discuss signs of further decline, how aggressive she  wishes to be with her care including resuscitation, and how they would feel about her dying at home.   She had some visitors come in at this point in our conversation and we will continue discussions tomorrow. Emotional support provided.   Primary Decision Maker NEXT OF KIN husband     SUMMARY OF RECOMMENDATIONS   - Continue conversations regarding [facility] for home with hospice most likely  Code Status/Advance Care Planning:  Limited code - will discuss more tomorrow   Symptom Management:   Continue current pain regimen.   Continue wound care.  Consider peritoneal drain in future.   Palliative Prophylaxis:   Bowel Regimen, Delirium Protocol, Frequent Pain Assessment and Turn Reposition  Additional Recommendations (Limitations, Scope, Preferences):  Avoid Hospitalization  Psycho-social/Spiritual:   Desire for further Chaplaincy support:yes  Additional Recommendations: Caregiving  Support/Resources, Education on Hospice and Grief/Bereavement Support  Prognosis:   < 6 months  Discharge Planning: Home with Hospice      Primary Diagnoses: Present on Admission: **None**   I have reviewed the medical record, interviewed the patient and family, and examined the patient. The following aspects are pertinent.  Past Medical History:  Diagnosis Date  . Anemia   . Arthritis   . Blood transfusion without reported diagnosis   . Cancer ([place])   . Cervical disc disease   . CHF (congestive heart failure) ([place]) 03/11/2017  . COPD (chronic obstructive pulmonary disease) ([facility])   . Intervertebral disk disease   . Lumbar disc disease   . Migraines   . Nicotine dependence   . UTI (urinary tract infection)    Social History   Social History  . Marital status: Married    Spouse name: N/A  . Number of children: 2  . Years of education: N/A   Occupational History  . Social research officer, government ---retired   . 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 Smoking  status: Current Every Day Smoker    Packs/day: 1.00    Years: 40.00    Types: Cigarettes  . Smokeless tobacco: Never Used  . Alcohol use No  . Drug use: No  . Sexual activity: Not Asked   Other Topics Concern  . None   Social History Narrative   [name] daughter      Has living will   Husband is health care POA   Would accept resuscitation but no prolonged ventilation   No tube feeds if cognitively unaware   Family History  Problem Relation Age of Onset  . Cancer Mother   . COPD Father   . Heart disease Father   . Hypertension Father   . Thyroid disease Other   . Cancer Other     mother's side  . Heart disease Other     Dad's side  . Colon cancer Neg Hx    Scheduled Meds: . collagenase   Topical Daily  . dexamethasone  8 mg Oral Daily  . [START ON 03/13/2017] fentaNYL  25 mcg Transdermal Q72H  . furosemide  20 mg Intravenous BID  . gabapentin  800 mg Oral TID  . Melatonin  2.5 mg Oral QHS  . multivitamin with minerals  1 tablet Oral Daily  . oxyCODONE  10 mg Oral Q12H  . polyethylene glycol  17 g Oral Daily  . potassium chloride  10 mEq Oral Daily  . sennosides-docusate sodium  2 tablet Oral Daily  . sodium chloride flush  10-40 mL Intracatheter Q12H  . tiZANidine  4 mg Oral TID   Continuous Infusions: . cefTRIAXone (ROCEPHIN) IVPB 1 gram/50 mL D5W     PRN Meds:.acetaminophen **OR** acetaminophen, bisacodyl, ondansetron **OR** ondansetron (ZOFRAN) IV, ondansetron, oxyCODONE, prochlorperazine, senna-docusate, sodium chloride flush Allergies  Allergen Reactions  . Indomethacin Other (See Comments)    Can not urinate  . Penicillins Rash  . Robaxin [Methocarbamol] Other (See Comments)    Urinary retention  . Nubain [Nalbuphine Hcl]    Review of Systems  Constitutional: Positive for activity change, fatigue and unexpected weight change. Negative for appetite change.  Gastrointestinal: Positive for abdominal distention. Negative for nausea and vomiting.    Musculoskeletal: Positive for back pain.  Neurological: Positive for weakness.    Physical Exam  Constitutional: She is oriented to person, place, and time. She appears well-developed. She appears cachectic.  HENT:  Head: Normocephalic and atraumatic.  Cardiovascular: Tachycardia present.   Pulmonary/Chest: Effort normal. No accessory muscle usage. No tachypnea. No respiratory distress.  Abdominal: She exhibits distension.  Neurological: She is alert and oriented to person, place, and time.  Nursing note and vitals reviewed.   Vital Signs:  BP (!) 148/81 (BP Location: Left Arm)   Pulse (!) 131   Temp 98.5 F (36.9 C) (Oral)   Resp 19   Ht _0  (1.6 m)   Wt 61.2 kg (135 lb)   SpO2 97%   BMI 23.91 kg/m  Pain Assessment: 0-10 POSS *See Group Information*: 1-Acceptable,Awake and alert Pain Score: 7    SpO2: SpO2: 97 % O2 Device:SpO2: 97 % O2 Flow Rate: .O2 Flow Rate (L/min): 3 L/min  IO: Intake/output summary:  Intake/Output Summary (Last 24 hours) at 03/12/17 1744 Last data filed at 03/12/17 1700  Gross per 24 hour  Intake              490 ml  Output             2650 ml  Net            -2160 ml    LBM: Last BM Date: 03/11/17 Baseline Weight: Weight: 61.2 kg (135 lb) Most recent weight: Weight: 61.2 kg (135 lb)     Palliative Assessment/Data: PPS: 20-30%   Flowsheet Rows     Most Recent Value  Intake Tab  Referral Department  Hospitalist  Unit at Time of Referral  Cardiac/Telemetry Unit  Palliative Care Primary Diagnosis  Cancer  Date Notified  03/11/17  Palliative Care Type  New Palliative care  Reason for referral  Clarify Goals of Care  Date of Admission  03/11/17  # of days IP prior to Palliative referral  0  Clinical Assessment  Psychosocial & Spiritual Assessment  Palliative Care Outcomes       Time Total: 69mn  Greater than 50%  of this time was spent counseling and coordinating care related to the above assessment and plan.  Signed  by: AVinie Sill NP Palliative Medicine Team Pager # [phone](M-F 8a-5p) Team Phone # [phone](Nights/Weekends)

## 2017-03-12 NOTE — Progress Notes (Signed)
Rachel Butler, Rachel Butler ([id number]) Visit Report for 03/11/2017 Chief Complaint Document Details Patient Name: Rachel Butler, Rachel Butler. Date of Service: 03/11/2017 8:45 AM Medical Record Number: [id number] Patient Account Number: [id number] Date of Birth/Sex: January 07, 1956 (61 y.o. Female) Treating RN: Carolyne Fiscal, Debi Primary Care Provider: Viviana Simpler Other Clinician: Referring Provider: Viviana Simpler Treating Provider/Extender: Frann Rider in Treatment: 2 Information Obtained from: Patient Chief Complaint Patient is at the clinic for treatment of an open pressure ulcer to the sacrum gluteal and coccygeal region for about 6 weeks Electronic Signature(s) Signed: 03/11/2017 9:33:17 AM By: Christin Fudge MD, FACS Entered By: Christin Fudge on 03/11/2017 09:33:16 Rachel Butler ([id number]) -------------------------------------------------------------------------------- HPI Details Patient Name: Rachel Butler Date of Service: 03/11/2017 8:45 AM Medical Record Number: [id number] Patient Account Number: [id number] Date of Birth/Sex: [birth year] (61 y.o. Female) Treating RN: Carolyne Fiscal, Debi Primary Care Provider: Viviana Simpler Other Clinician: Referring Provider: Viviana Simpler Treating Provider/Extender: Frann Rider in Treatment: 2 History of Present Illness Location: area of her back sacrum and buttocks Quality: Patient reports experiencing a sharp pain to affected area(s). Severity: Patient states wound are getting worse. Duration: Patient has had the wound for >2 months prior to seeking treatment at the wound center Timing: Pain in wound is constant (hurts all the time) Context: The wound appeared gradually over time Modifying Factors: Other treatment(s) tried include:local care as given by a home health Associated Signs and Symptoms: Patient reports having difficulty standing for long periods. HPI Description: 61 year old patient has been recently seen at her PCPs  office for chronic heart failure with lymphedema, hypertension, hypomagnesemia, liver cancer and tobacco abuse. Most recent ejection fraction done showed 60-65% along with severe MR, moderate TR and elevated PA pressures. Was recently admitted for sepsis to the hospital and developed hypotension after an liver biopsy. She had several blood transfusions and had hepatic artery embolization. Past medical history significant for anemia, arthritis, liver cancer, nicotine dependence, status post abdominal hysterectomy, cervical disc surgery, recent hospitalization with interventional radiology procedures done for problems with her liver and treatment of issues with this. She smokes a pack of cigarettes daily and has done this for 40 years. 03/11/2017 -- patient is awake and alert but very weak and is unable to transfer on her own and had to be lifted onto and off the bed. The patient and her husband did not understand the gravity of her situation and still actively taking chemotherapy. After my review today I believe she is deteriorating extremely rapidly and her decubitus ulcers which are unstable at the present time are rapidly getting larger and now involves the perineal region. I believe the patient needs hospice care, but today is too weak to be sent home and hence I have spoken to the ER and discussed with Dr. Reita Cliche the management and she will be appropriately set up for home hospice. I did try to contact her medical oncologist Dr. Randa Evens and I'm waiting for a call back. Electronic Signature(s) Signed: 03/11/2017 9:39:22 AM By: Christin Fudge MD, FACS Entered By: Christin Fudge on 03/11/2017 09:39:22 Rachel Butler ([id number]) -------------------------------------------------------------------------------- Physical Exam Details Patient Name: Rachel Butler Date of Service: 03/11/2017 8:45 AM Medical Record Number: [id number] Patient Account Number: [id number] Date of Birth/Sex:  1956-04-12 (61 y.o. Female) Treating RN: Carolyne Fiscal, Debi Primary Care Provider: Viviana Simpler Other Clinician: Referring Provider: Viviana Simpler Treating Provider/Extender: Frann Rider in Treatment: 2 Constitutional hypotension. Pulse regular. Respirations normal and unlabored. Afebrile. overall in a poor shape but awake and  alert. Eyes Nonicteric. Reactive to light. Ears, Nose, Mouth, and Throat Lips, teeth, and gums WNL.Marland Kitchen Moist mucosa without lesions. Neck supple and nontender. No palpable supraclavicular or cervical adenopathy. Normal sized without goiter. Respiratory WNL. No retractions.. Cardiovascular Pedal Pulses WNL. No clubbing, cyanosis or edema. Chest Breasts symmetical and no nipple discharge.. Breast tissue WNL, no masses, lumps, or tenderness.. Gastrointestinal (GI) Abdomen without masses or tenderness.. No liver or spleen enlargement or tenderness.. Genitourinary (GU) No hydrocele, spermatocele, tenderness of the cord, or testicular mass.Marland Kitchen Penis without lesions.Rachel Butler without lesions. No cystocele, or rectocele. Pelvic support intact, no discharge.Marland Kitchen Urethra without masses, tenderness or scarring.Marland Kitchen Lymphatic No adneopathy. No adenopathy. No adenopathy. Musculoskeletal Adexa without tenderness or enlargement.. Digits and nails w/o clubbing, cyanosis, infection, petechiae, ischemia, or inflammatory conditions.. Integumentary (Hair, Skin) No suspicious lesions. No crepitus or fluctuance. No peri-wound warmth or erythema. No masses.Marland Kitchen Psychiatric Judgement and insight Intact.. No evidence of depression, anxiety, or agitation.. Notes large unstageable sacral decubitus ulcers and extensive ulceration of the perineum. He continued to use silver alginate and local care Rachel Butler, Rachel Butler ([id number]) Electronic Signature(s) Signed: 03/11/2017 9:41:22 AM By: Christin Fudge MD, FACS Entered By: Christin Fudge on 03/11/2017 09:41:22 Rachel Butler  ([id number]) -------------------------------------------------------------------------------- Physician Orders Details Patient Name: Rachel Butler Date of Service: 03/11/2017 8:45 AM Medical Record Number: [id number] Patient Account Number: [id number] Date of Birth/Sex: 05-Aug-1956 (61 y.o. Female) Treating RN: Carolyne Fiscal, Debi Primary Care Provider: Viviana Simpler Other Clinician: Referring Provider: Viviana Simpler Treating Provider/Extender: Frann Rider in Treatment: 2 Verbal / Phone Orders: Yes Clinician: [place], Debi Read Back and Verified: Yes Diagnosis Coding Wound Cleansing Wound #1 Gluteus o Clean wound with Normal Saline. o Cleanse wound with mild soap and water Wound #2 Left Labia o Clean wound with Normal Saline. o Cleanse wound with mild soap and water Anesthetic Wound #1 Gluteus o Topical Lidocaine 4% cream applied to wound bed prior to debridement - clinic use Wound #2 Left Labia o Topical Lidocaine 4% cream applied to wound bed prior to debridement - clinic use Skin Barriers/Peri-Wound Care Wound #1 Gluteus o Skin Prep Wound #2 Left Labia o Skin Prep Primary Wound Dressing Wound #1 Gluteus o Aquacel Ag Wound #2 Left Labia o Aquacel Ag Secondary Dressing Wound #1 Gluteus o ABD pad o Dry Gauze Wound #2 Left NATSHA, GUIDRY. ([id number]) o Dry Gauze Dressing Change Frequency Wound #1 Gluteus o Change dressing every day. Wound #2 Left Labia o Change dressing every day. Follow-up Appointments Wound #1 Gluteus o Return Appointment in 1 week. Wound #2 Left Labia o Return Appointment in 1 week. Off-Loading Wound #1 Gluteus o Roho cushion for wheelchair - HHRN to order. [place] to order. Mattress appropriate for stage 3 wounds. o Turn and reposition every 2 hours Wound #2 Left Labia o Roho cushion for wheelchair - HHRN to order. [place] to order. Mattress appropriate  for stage 3 wounds. o Turn and reposition every 2 hours Additional Orders / Instructions Wound #1 Gluteus o Stop Smoking o Increase protein intake. Wound #2 Left Labia o Stop Smoking o Increase protein intake. o Other: - Go directly to Emergency [place] #1 [place] Visits - [place] Nurse may visit PRN to address patientos wound care needs. o FACE TO FACE ENCOUNTER: MEDICARE and MEDICAID PATIENTS: I certify that this patient is under my care and that I had a face-to-face encounter that meets the  physician face-to-face encounter requirements with this patient on this date. The encounter with the patient was in whole or in part for the following MEDICAL CONDITION: (primary reason for [place]) MEDICAL NECESSITY: I certify, that based on my findings, NURSING services are a medically necessary home health service. HOME BOUND STATUS: I certify that my clinical findings LYNDON, CHENOWETH ([id number]) support that this patient is homebound (i.e., Due to illness or injury, pt requires aid of supportive devices such as crutches, cane, wheelchairs, walkers, the use of special transportation or the assistance of another person to leave their place of residence. There is a normal inability to leave the home and doing so requires considerable and taxing effort. Other absences are for medical reasons / religious services and are infrequent or of short duration when for other reasons). o If current dressing causes regression in wound condition, may D/C ordered dressing product/s and apply Normal Saline Moist Dressing daily until next [place] / Other MD appointment. [place] of regression in wound condition at [phone]. o Please direct any NON-WOUND related issues/requests for orders to patient's Primary Care Physician Wound #2 Left [place] Visits - [place] Nurse may visit PRN to address patientos wound care needs. o FACE TO FACE ENCOUNTER: MEDICARE and MEDICAID PATIENTS: I certify that this patient is under my care and that I had a face-to-face encounter that meets the physician face-to-face encounter requirements with this patient on this date. The encounter with the patient was in whole or in part for the following MEDICAL CONDITION: (primary reason for [place]) MEDICAL NECESSITY: I certify, that based on my findings, NURSING services are a medically necessary home health service. HOME BOUND STATUS: I certify that my clinical findings support that this patient is homebound (i.e., Due to illness or injury, pt requires aid of supportive devices such as crutches, cane, wheelchairs, walkers, the use of special transportation or the assistance of another person to leave their place of residence. There is a normal inability to leave the home and doing so requires considerable and taxing effort. Other absences are for medical reasons / religious services and are infrequent or of short duration when for other reasons). o If current dressing causes regression in wound condition, may D/C ordered dressing product/s and apply Normal Saline Moist Dressing daily until next [place] / Other MD appointment. [place] of regression in wound condition at [phone]. o Please direct any NON-WOUND related issues/requests for orders to patient's Primary Care Physician Medications-please add to medication list. Wound #1 Gluteus o Santyl Enzymatic Ointment - Hold o Other: - Vitamin C, Vitamin A, Zinc, MVI Wound #2 Left Labia o Santyl Enzymatic Ointment - Hold o Other: - Vitamin C, Vitamin A, Zinc, MVI Electronic Signature(s) Signed: 03/11/2017 4:10:34 PM By: Christin Fudge MD, FACS Signed: 03/11/2017 4:31:30 PM By: Alric Quan Entered By: Alric Quan on 03/11/2017 10:14:28 Rachel Butler, Rachel Butler  ([id number] Baseman, Derrel Nip ([id number]) -------------------------------------------------------------------------------- Problem List Details Patient Name: MACKENZEY, CROWNOVER. Date of Service: 03/11/2017 8:45 AM Medical Record Number: [id number] Patient Account Number: [id number] Date of Birth/Sex: 02-04-1956 (61 y.o. 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 Female) Treating RN: Carolyne Fiscal, Debi Primary Care Provider: Viviana Simpler Other Clinician: Referring Provider: Viviana Simpler Treating Provider/Extender: Frann Rider in Treatment: 2 Subjective Chief Complaint Information obtained from Patient Patient is at the clinic for treatment of an open pressure ulcer to the sacrum gluteal and coccygeal region for about 6 weeks History of Present Illness (HPI) The following HPI elements were documented for the patient's wound: Location: area of her back sacrum and  buttocks Quality: Patient reports experiencing a sharp pain to affected area(s). Severity: Patient states wound are getting worse. Duration: Patient has had the wound for >2 months prior to seeking treatment at the wound center Timing: Pain in wound is constant (hurts all the time) Context: The wound appeared gradually over time Modifying Factors: Other treatment(s) tried include:local care as given by a home health Associated Signs and Symptoms: Patient reports having difficulty standing for long periods. 61 year old patient has been recently seen at her PCPs office for chronic heart failure with lymphedema, hypertension, hypomagnesemia, liver cancer and tobacco abuse. Most recent ejection fraction done showed 60-65% along with severe MR, moderate TR and elevated PA pressures. Was recently admitted for sepsis to the hospital and developed hypotension after an liver biopsy. She had several blood transfusions and had hepatic artery embolization. Past medical history significant for anemia, arthritis, liver cancer, nicotine dependence, status post abdominal hysterectomy, cervical disc surgery, recent hospitalization with interventional radiology procedures done for problems with her liver and treatment of issues with this. She smokes a pack of cigarettes daily and has done this for 40 years. 03/11/2017 -- patient is awake and alert but very weak and is unable to transfer on her own and had to be lifted onto and off the bed. The patient and her husband did not understand the gravity of her situation and still actively taking chemotherapy. After my review today I believe she is deteriorating extremely rapidly and her decubitus ulcers which are unstable at the present time are rapidly getting larger and now involves the perineal region. I believe the patient needs hospice care, but today is too weak to be sent home and hence I have spoken to the ER and discussed with Dr. Reita Cliche the management and she  will be appropriately set up for home hospice. I did try to contact her medical oncologist Dr. Randa Evens and I'm waiting for a call back. Rachel Butler, Rachel Butler ([id number]) Objective Constitutional hypotension. Pulse regular. Respirations normal and unlabored. Afebrile. overall in a poor shape but awake and alert. Vitals Time Taken: 8:48 AM, Height: 63 in, Weight: 119 lbs, BMI: 21.1, Temperature: 97.7 F, Pulse: 96 bpm, Respiratory Rate: 18 breaths/min, Blood Pressure: 63/44 mmHg. General Notes: taken manually and it was 60/48 then taken again with the dinamap and it was 70/46 I did let Dr, Con Memos know and he said as long as she is asymptomatic it was okay. Eyes Nonicteric. Reactive to light. Ears, Nose, Mouth, and Throat Lips, teeth, and gums WNL.Marland Kitchen Moist mucosa without lesions. Neck supple and nontender. No palpable supraclavicular or cervical adenopathy. Normal sized without  goiter. Respiratory WNL. No retractions.. Cardiovascular Pedal Pulses WNL. No clubbing, cyanosis or edema. Chest Breasts symmetical and no nipple discharge.. Breast tissue WNL, no masses, lumps, or tenderness.. Gastrointestinal (GI) Abdomen without masses or tenderness.. No liver or spleen enlargement or tenderness.. Genitourinary (GU) No hydrocele, spermatocele, tenderness of the cord, or testicular mass.Marland Kitchen Penis without lesions.Rachel Butler without lesions. No cystocele, or rectocele. Pelvic support intact, no discharge.Marland Kitchen Urethra without masses, tenderness or scarring.Marland Kitchen Lymphatic No adneopathy. No adenopathy. No adenopathy. Musculoskeletal Adexa without tenderness or enlargement.. Digits and nails w/o clubbing, cyanosis, infection, petechiae, ischemia, or inflammatory conditions.Marland Kitchen Psychiatric Judgement and insight Intact.. No evidence of depression, anxiety, or agitation.Rachel Butler ([id number]) General Notes: large unstageable sacral decubitus ulcers and extensive ulceration of the perineum.  He continued to use silver alginate and local care Integumentary (Hair, Skin) No suspicious lesions. No crepitus or fluctuance. No peri-wound warmth or erythema. No masses.. Wound #1 status is Open. Original cause of wound was Pressure Injury. The wound is located on the Gluteus. The wound measures 17.5cm length x 10.9cm width x 0.1cm depth; 149.815cm^2 area and 14.981cm^3 volume. The wound is limited to skin breakdown. There is no tunneling or undermining noted. There is a large amount of serosanguineous drainage noted. The wound margin is distinct with the outline attached to the wound base. There is small (1-33%) red granulation within the wound bed. There is a large (67-100%) amount of necrotic tissue within the wound bed including Eschar and Adherent Slough. The periwound skin appearance exhibited: Maceration, Rubor, Erythema. The surrounding wound skin color is noted with erythema which is circumferential. Periwound temperature was noted as No Abnormality. The periwound has tenderness on palpation. Wound #2 status is Open. Original cause of wound was Pressure Injury. The wound is located on the Left Labia. The wound measures 1.5cm length x 0.7cm width x 0.1cm depth; 0.825cm^2 area and 0.082cm^3 volume. There is no tunneling or undermining noted. There is a large amount of serous drainage noted. The wound margin is distinct with the outline attached to the wound base. There is no granulation within the wound bed. There is a large (67-100%) amount of necrotic tissue within the wound bed including Adherent Slough. The periwound skin appearance exhibited: Erythema. The surrounding wound skin color is noted with erythema which is circumferential. Periwound temperature was noted as No Abnormality. The periwound has tenderness on palpation. 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 Rachel Butler, Rachel Butler ([id number]) Cleanse wound with mild soap and water Wound #2 Left Labia: Clean wound with Normal Saline. Cleanse wound with mild soap and water Anesthetic: Wound #1 Gluteus: Topical Lidocaine 4% cream applied to wound bed prior to debridement - clinic use Wound #2 Left Labia: Topical Lidocaine 4% cream applied to wound bed prior to debridement - clinic use Skin Barriers/Peri-Wound Care: Wound #1 Gluteus: Skin Prep Wound #2 Left Labia: Skin Prep Primary Wound Dressing: Wound #1 Gluteus: Aquacel Ag Wound #2 Left Labia: Aquacel Ag Secondary Dressing: Wound #1 Gluteus: ABD pad Dry Gauze Wound #2 Left Labia: Dry Gauze Dressing Change Frequency: Wound #1 Gluteus: Change dressing every day. Wound #2 Left Labia: Change dressing every day. Follow-up Appointments: Wound #1 Gluteus: Return Appointment in 1 week. Wound #2 Left  Labia: Return Appointment in 1 week. Off-Loading: Wound #1 Gluteus: Roho cushion for wheelchair - HHRN to order. Mattress - HHRN to order. Mattress appropriate for stage 3 wounds. Turn and reposition every 2 hours Wound #2 Left Labia: Roho cushion for wheelchair - HHRN to order. Mattress - HHRN to order. Mattress appropriate for stage 3 wounds. Turn and reposition every 2 hours Additional Orders / Instructions: Wound #1 Gluteus: Stop Smoking Increase protein intake. Wound #2 Left Labia: Stop Smoking Increase protein intake. Rachel Butler, Rachel Butler ([id number]) Other: - Go directly to Emergency Room Home Health: Wound #1 Gluteus: Continue Home Health Visits - [place] Nurse may visit PRN to address patient s wound care needs. FACE TO FACE ENCOUNTER: MEDICARE and MEDICAID PATIENTS: I certify that  this patient is under my care and that I had a face-to-face encounter that meets the physician face-to-face encounter requirements with this patient on this date. The encounter with the patient was in whole or in part for the following MEDICAL CONDITION: (primary reason for [place]) MEDICAL NECESSITY: I certify, that based on my findings, NURSING services are a medically necessary home health service. HOME BOUND STATUS: I certify that my clinical findings support that this patient is homebound (i.e., Due to illness or injury, pt requires aid of supportive devices such as crutches, cane, wheelchairs, walkers, the use of special transportation or the assistance of another person to leave their place of residence. There is a normal inability to leave the home and doing so requires considerable and taxing effort. Other absences are for medical reasons / religious services and are infrequent or of short duration when for other reasons). If current dressing causes regression in wound condition, may D/C ordered dressing product/s and apply Normal Saline Moist Dressing daily until next [place] / Other MD appointment. [place] of regression in wound condition at [phone]. Please direct any NON-WOUND related issues/requests for orders to patient's Primary Care Physician Wound #2 Left Labia: [place] Visits - [place] Nurse may visit PRN to address patient s wound care needs. FACE TO FACE ENCOUNTER: MEDICARE and MEDICAID PATIENTS: I certify that this patient is under my care and that I had a face-to-face encounter that meets the physician face-to-face encounter requirements with this patient on this date. The encounter with the patient was in whole or in part for the following MEDICAL CONDITION: (primary reason for [place]) MEDICAL NECESSITY: I certify, that based on my findings, NURSING services are a medically necessary home health  service. HOME BOUND STATUS: I certify that my clinical findings support that this patient is homebound (i.e., Due to illness or injury, pt requires aid of supportive devices such as crutches, cane, wheelchairs, walkers, the use of special transportation or the assistance of another person to leave their place of residence. There is a normal inability to leave the home and doing so requires considerable and taxing effort. Other absences are for medical reasons / religious services and are infrequent or of short duration when for other reasons). If current dressing causes regression in wound condition, may D/C ordered dressing product/s and apply Normal Saline Moist Dressing daily until next [place] / Other MD appointment. [place] of regression in wound condition at [phone]. Please direct any NON-WOUND related issues/requests for orders to patient's Primary Care Physician Medications-please add to medication list.: Wound #1 Gluteus: Santyl Enzymatic Ointment - Hold Other: - Vitamin C, Vitamin A, Zinc, MVI Wound #2  Left Labia: Santyl Enzymatic Ointment - Hold Other: - Vitamin C, Vitamin A, Zinc, MVI Butler, Rachel J. ([id number]) The patient is awake and alert but very weak and is unable to transfer on her own and had to be lifted onto and off the bed. The patient and her husband did not understand the gravity of her situation and are still actively taking chemotherapy. After my review today I believe she is deteriorating extremely rapidly and her decubitus ulcers which are unstageble at the present time are rapidly getting larger and now involves the perineal region. I believe the patient needs hospice care, but today is too weak to be sent home and hence I have spoken to the ER and discussed with Dr. Reita Cliche the management and she will be appropriately set up for home hospice. I did try to contact her medical oncologist Dr. Randa Evens and I'm waiting for a  call back. Notes Spoke to her medical oncologist Dr. Randa Evens and we discussed the advanced nature of her disease and I have strongly recommended hospice care and she concurs and she will make sure that further chemotherapy is not given. Electronic Signature(s) Signed: 03/11/2017 4:12:14 PM By: Christin Fudge MD, FACS Previous Signature: 03/11/2017 10:03:27 AM Version By: Christin Fudge MD, FACS Previous Signature: 03/11/2017 9:42:31 AM Version By: Christin Fudge MD, FACS Entered By: Christin Fudge on 03/11/2017 16:12:14 Rachel Butler ([id number]) -------------------------------------------------------------------------------- SuperBill Details Patient Name: Rachel Butler Date of Service: 03/11/2017 Medical Record Number: [id number] Patient Account Number: [id number] Date of Birth/Sex: 01-22-1956 (61 y.o. 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 Nicotine dependence, cigarettes, with other nicotine-induced disorders Facility Procedures CPT4 Code: [id number] Description: 99214 - WOUND CARE VISIT-LEV 4 EST PT Modifier: Quantity: 1 Physician Procedures CPT4 Code Description: 9826415 83094 - WC PHYS LEVEL 4 - EST PT ICD-10 Description Diagnosis L89.43 Pressure ulcer of contiguous site of back, buttock E44.0 Moderate protein-calorie malnutrition D50.0 Iron deficiency anemia secondary to blood loss (ch  F17.218 Nicotine dependence, cigarettes, with other nicoti Modifier: and hip, stage ronic) ne-induced disor Quantity: 1 3 ders Electronic Signature(s) Signed: 03/11/2017 4:10:34 PM By: Christin Fudge MD,  FACS Signed: 03/11/2017 4:31:30 PM By: Alric Quan Previous Signature: 03/11/2017 9:42:47 AM Version By: Christin Fudge MD, FACS Entered By: Alric Quan on 03/11/2017 12:28:50

## 2017-03-12 NOTE — Plan of Care (Signed)
Problem: Safety: Goal: Ability to remain free from injury will improve Outcome: Progressing Patient has been free of falls this admission. Fall precautions in place, including bed alarm. Precautions will stay in place through discharge to promote patient safety.

## 2017-03-12 NOTE — Consult Note (Addendum)
[facility] Nurse wound consult note Reason for Consult: Consult requested for sacrum wound.  Pt was followed by the outpatient wound care center prior to admission and states she was receiving debridements, but is unsure of what other topical treatment they were using. Pt is very emaciated and immobile and has multiple systemic factors which can impair healing. Wound type: Unstageable pressure injury to sacrum Pressure Injury POA: Yes Measurement: 13.5X10cm Wound bed: 40% tightly adhered eschar, 30% red, 30% yellow slough Drainage (amount, consistency, odor) Mod amt tan drainage with strong foul odor  Periwound: Generalized erythremia surrounding. Dressing procedure/placement/frequency: It is difficult to keep the wound from becoming soiled, since she uses the bedpan often and dressing gets wet related to the close proximity. If aggressive plan of care is desired, then consider a surgical consult for debridement.  Santyl for enzymatic debridement of nonviable tissue.  Pt can resume follow-up with the outpatient wound care center after discharge. Discussed plan of care with patient and family member at the bedside. Pt plans to discharge tomorrow, according to the EMR.  She could benefit from an air mattress at whatever facility she is transferred to. Please re-consult if further assistance is needed.  Thank-you,  Julien Girt MSN, [place], [place], [place], [place]

## 2017-03-13 DIAGNOSIS — Z66 Do not resuscitate: Secondary | ICD-10-CM

## 2017-03-13 DIAGNOSIS — Z7189 Other specified counseling: Secondary | ICD-10-CM

## 2017-03-13 DIAGNOSIS — Z515 Encounter for palliative care: Secondary | ICD-10-CM

## 2017-03-13 MED ORDER — FUROSEMIDE 20 MG PO TABS: 20.0000 mg | ORAL_TABLET | Freq: Every day | ORAL | Status: DC

## 2017-03-13 MED ORDER — CEPHALEXIN 500 MG PO CAPS
500.0000 mg | ORAL_CAPSULE | Freq: Two times a day (BID) | ORAL | Status: DC
  Administered 2017-03-13: 500 mg via ORAL
  Filled 2017-03-13: qty 1

## 2017-03-13 NOTE — Discharge Summary (Signed)
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 Anemia   . Arthritis   . Blood transfusion without reported diagnosis   . Cancer ([place])   . Cervical disc disease   . CHF (congestive heart failure) ([place]) 03/11/2017  . COPD (chronic obstructive pulmonary disease) ([place])   . Intervertebral disk disease   . Lumbar disc disease   . Migraines   . Nicotine dependence   . UTI (urinary tract infection)     HOSPITAL COURSE:  61 year old female with a history of liver metastasis with likely breast primary neoplasm, sacral decubitus ulcer,protein calorie malnutrition, diastolic heart failure and ongoing tobacco abuse who presents with generalized weakness.  1. Acute on chronic diastolic heart failure: Chest x-ray and examination is consistent with CHF exacerbation Received Lasix 20 IV twice a day---diureses >5 liters. Change to oral lasix Monitor daily weight with intake and output  2. Sacral decubitus ulcer present on admission: Wound care consult-Follow recommendation  3. Severe protein calorie malnutitionin the context of illness: Dietary consultation  4. Generalized weakness due to protein calorie malnutrition and underlying cancer  5. Liver metastases with likely breast primary neoplasm: Oncology consultation appreciated. Patient will be on hold for further chemotherapy. She is currently not a candidate given her  severe debilitation and weakness for further chemotherapy treatments per oncology -Oncology had a long conversation with patient  -Palliative consultation appreciated. Pt is DNR and pt hand husband has decided for home with hospice  6. Chronic hyponatremia: Sodium is stable at 130  7. UTI - continued Rocephin---change to po keflex  8. Thrombocytopenia due to chemotherapy Follow platelet count  9.Tobacco dependence: Patient is encouraged to quit smoking. Counseling was provided for 4 minutes. d/c home today with hospice   Overall prognosis poor  CONSULTS OBTAINED:  Treatment Team:  Sindy Guadeloupe, MD  DRUG ALLERGIES:   Allergies  Allergen Reactions  . Indomethacin Other (See Comments)    Can not urinate  . Penicillins Rash  . Robaxin [Methocarbamol] Other (See Comments)    Urinary retention  . Nubain [Nalbuphine Hcl]     DISCHARGE MEDICATIONS:   Current Discharge Medication List    CONTINUE these medications which have NOT CHANGED   Details  BIOTIN 5000 PO Take 1 tablet by mouth daily.     dexamethasone (DECADRON) 4 MG tablet Take 2 tablets (8 mg total) by mouth daily. Start the day after chemotherapy for 2 days. Qty: 30 tablet, Refills: 1   Associated Diagnoses: Liver metastases ([place]); Metastatic breast cancer (HCC)    fentaNYL (DURAGESIC - DOSED MCG/HR) 25 MCG/HR patch Place 25 mcg onto the skin every 3 (three) days.  Refills: 0    furosemide (LASIX) 20 MG tablet Take 1 tablet (20 mg total) by mouth daily. 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 Qty: 90 tablet, Refills: 11    lidocaine-prilocaine (EMLA) cream Apply 1 application topically as needed. Apply small amount to port site at least 1 hour prior to it being accessed, cover with plastic wrap Qty: 30 g, Refills: 1     LORazepam (ATIVAN) 0.5 MG tablet Take 1 tablet (0.5 mg total) by mouth every 6 (six) hours as needed (Nausea or vomiting). Qty: 30 tablet, Refills: 0   Associated Diagnoses: Liver metastases ([place]); Metastatic breast cancer (HCC)    Melatonin 2.5 MG CAPS Take 1 capsule by mouth at bedtime.   Associated Diagnoses: Liver metastases ([place]); Metastatic breast cancer (HCC)    metoprolol tartrate (LOPRESSOR) 25 MG tablet Take 0.5 tablets (12.5 mg total) by mouth 2 (two) times daily. Qty: 60 tablet, Refills: 0    Multiple Vitamins-Minerals (MULTIVITAMIN WITH MINERALS) tablet Take 1 tablet by mouth daily.    ondansetron (ZOFRAN) 8 MG tablet Take 1 tablet (8 mg total) by mouth 2 (two) times daily as needed for refractory nausea / vomiting. Start on day 3 after chemo. Qty: 30 tablet, Refills: 1   Associated Diagnoses: Liver metastases ([place]); Metastatic breast cancer (HCC)    oxyCODONE (OXYCONTIN) 10 mg 12 hr tablet Take 1 tablet (10 mg total) by mouth every 12 (twelve) hours. Qty: 60 tablet, Refills: 0    Oxycodone HCl 10 MG TABS Take 1 tablet (10 mg total) by mouth every 4 (four) hours as needed. Qty: 60 tablet, Refills: 0   Associated Diagnoses: Liver metastases ([place]); Metastatic breast cancer (HCC)    polyethylene glycol (MIRALAX / GLYCOLAX) packet Take 17 g by mouth daily. Qty: 14 each, Refills: 0    potassium chloride (K-DUR) 10 MEQ tablet Take 1 tablet (10 mEq total) by mouth daily. Qty: 30 tablet, Refills: 5    prochlorperazine (COMPAZINE) 10 MG tablet Take 1 tablet (10 mg total) by mouth every 6 (six) hours as needed (Nausea or vomiting). 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    tiZANidine  (ZANAFLEX) 4 MG capsule Take 4 mg by mouth 3 (three) times daily. Takes 1 tid and up to 3 tablets at bedtime to sleep   Associated Diagnoses: Liver metastases ([place]); Metastatic breast cancer (HCC)    clotrimazole (MYCELEX) 10 MG troche Take 1 lozenge (10 mg total) by mouth 5 (five) times daily. 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  You Must read complete instructions/literature along with all the possible adverse reactions/side effects for all the Medicines you take and that have been prescribed to you. Take any new Medicines after you have completely understood and accept all the possible adverse reactions/side effects.   Please note  You were cared for by a hospitalist during your hospital stay.  If you have any questions about your discharge medications or the care you received while you were in the hospital after you are discharged, you can call the unit and asked to speak with the hospitalist on call if the hospitalist that took care of you is not available. Once you are discharged, your primary care physician will handle any further medical issues. Please note that NO REFILLS for any discharge medications will be authorized once you are discharged, as it is imperative that you return to your primary care physician (or establish a relationship with a primary care physician if you do not have one) for your aftercare needs so that they can reassess your need for medications and monitor your lab  values. Today   SUBJECTIVE    No new complaints VITAL SIGNS:  Blood pressure (!) 164/72, pulse (!) 129, temperature 98.8 F (37.1 C), temperature source Oral, resp. rate 18, height 5\' 3"  (1.6 m), weight 61.2 kg (135 lb), SpO2 98 %.  I/O:   Intake/Output Summary (Last 24 hours) at 03/13/17 1230 Last data filed at 03/13/17 1051  Gross per 24 hour  Intake              840 ml  Output             3275 ml  Net            -2435 ml    PHYSICAL EXAMINATION:  GENERAL:  61 y.o.-year-old patient lying in the bed with no acute distress. Looks chronically ill EYES: Pupils equal, round, reactive to light and accommodation. No scleral icterus. Extraocular muscles intact.  HEENT: Head atraumatic, normocephalic. Oropharynx and nasopharynx clear.  NECK:  Supple, no jugular venous distention. No thyroid enlargement, no tenderness.  LUNGS:decreased  breath sounds bilaterally, no wheezing, no rales,no rhonchi or crepitation. No use of accessory muscles of respiration.  CARDIOVASCULAR: S1, S2 normal. No murmurs, rubs, or gallops.  ABDOMEN: Soft, non-tender, non-distended. Bowel sounds present. No organomegaly or mass.  EXTREMITIES: ++ pedal edema, nocyanosis, or clubbing.  NEUROLOGIC: Cranial nerves II through XII are intact. Muscle strength 5/5 in all extremities. Sensation intact. Gait not checked.  PSYCHIATRIC: The patient is alert and oriented x 3.  SKIN: No obvious rash, lesion, or ulcer. Chronic decubitus ulcer+  DATA REVIEW:   CBC   Recent Labs Lab 03/12/17 0519  WBC 9.8  HGB 9.6*  HCT 30.2*  PLT 50*    Chemistries   Recent Labs Lab 03/08/17 0848 03/11/17 1058 03/12/17 0519  NA 130* 130* 131*  K 3.4* 4.7 3.6  CL 91* 93* 92*  CO2 31 30 34*  GLUCOSE 81 97 123*  BUN 11 14 13   CREATININE 0.47 0.64 0.50  CALCIUM 7.0* 7.4* 7.2*  MG 1.0*  --   --   AST 28 55*  --   ALT 17 23  --   ALKPHOS 191* 301*  --   BILITOT 1.1 0.9  --     Microbiology Results   No results found  for this or any previous visit (from the past 240 hour(s)).  RADIOLOGY:  No results found.   Management plans discussed with the patient, family and they are in agreement.  CODE STATUS:     Code Status Orders        Start     Ordered   03/13/17 1030  Do not attempt resuscitation (DNR)  Continuous    Question Answer Comment  In the event of cardiac or respiratory ARREST Do  not call a "code blue"   In the event of cardiac or respiratory ARREST Do not perform Intubation, CPR, defibrillation or ACLS   In the event of cardiac or respiratory ARREST Use medication by any route, position, wound care, and other measures to relive pain and suffering. May use oxygen, suction and manual treatment of airway obstruction as needed for comfort.      03/13/17 1029    Code Status History    Date Active Date Inactive Code Status Order ID Comments User Context   03/11/2017  3:04 PM 03/13/2017 10:29 AM Partial Code [id number]  Bettey Costa, MD Inpatient   01/18/2017  8:54 PM 01/24/2017  8:08 PM Full Code [id number]  Jonetta Osgood, MD Inpatient   01/18/2017  4:48 PM 01/18/2017  8:39 PM Full Code [id number]  Saverio Danker, PA-C HOV   12/05/2014  8:59 PM 12/08/2014  3:38 PM Full Code [id number]  Geradine Girt, DO Inpatient    Advance Directive Documentation     Most Recent Value  Type of Advance Directive  Healthcare Power of Attorney, Living will  Pre-existing out of facility DNR order (yellow form or pink MOST form)  -  "MOST" Form in Place?  -      TOTAL TIME TAKING CARE OF THIS PATIENT: 40 minutes.    Alyla Pietila M.D on 03/13/2017 at 12:30 PM  Between 7am to 6pm - Pager - [phone] After 6pm go to [email or web address] - password [email or web address] [facility]  Office  [phone]  CC: Primary care physician; Viviana Simpler, MD

## 2017-03-13 NOTE — Progress Notes (Signed)
Patient is being discharge home to today after when home  equipments are deliver patient husband to take patient home via car

## 2017-03-13 NOTE — Care Management (Signed)
patient and her husband have decided to discharge home with hospice thru  [facility] .  Husband says he will transport home and will be able to get patient in the house.  Verbalizes understanding that patient will require her protable oxygen tank for transport home.  Will need a hospital bed and wheelchair.  has 02 and BSC.  Notified [place] and [place] liasion.

## 2017-03-13 NOTE — Progress Notes (Signed)
New referral for [facility] services at home received from [facility]. Mrs. Linsley is a 61 year old woman with a past medical history of multiple liver metastasis likely due to breast primary, stage 3 sacral decub, protein calorie malnutrition, COPD, diastolic heart failure who was  admitted on 03/11/2017 with increased weakness.  CXR in the ED showed CHF exacerbation, she was diuresed with IV lasix and converted to oral lasix, Wound Consult obtained for stage III sacral wound. She has also been treated for hyponatremia and a UTI. Liver mets with likely breast as primary site. Oncology was consulted, no further treatment offered. Palliative Medicine was consulted for goals of care and met with mrs. Boyd and her husband Jori Moll at bedside. They have chosen to focus on her comfort with the support of hospice services at home.  Writer met in the room with patient and her husband to initiate education regarding hospice serivces, philosophy and team approach to care with good understanding voiced. Patient was alert and oriented and participated in the discussion.  DME requested includes Hospital bed with soft system mattress, oxygen concentrator and portables as well as transport wheelchair.  All requested to be delivered today, Mr. Zalar needs to move furniture prior to delivery. Attending physician made aware. Patient information faxed to referral. Hospice information and contact numbers left with patient and her husband. Plan is for her to discharge home via car, PT evaluation has been requested for education on transfers. Thank you. Flo Shanks RN, BSN, [facility] [place], hospital liaison [phone] c

## 2017-03-13 NOTE — Progress Notes (Signed)
pts port deaccessed  w/o problems per protocol. Flushed with 10 ml ns followed by 5 ml hep lock  Flush/ 500 unit. Good blood return noted prior to deaccessed.   bandaid to site.  tol well.

## 2017-03-13 NOTE — Evaluation (Signed)
Physical Therapy Evaluation Patient Details Name: Rachel Butler MRN: [id number] DOB: March 09, 1956 Today's Date: 03/13/2017   History of Present Illness  Pt is a 61 y/o F who presents after near fall and weakness noted at wound care center.  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  Admitted for CHF exacerbation.  Pt has decided to d/c home with Hospice care. Pt's additional PMH includes cervical disc disease, COPD, lumbar disc disease, migraines, UTI.    Clinical Impression  Pt admitted with above diagnosis. Pt currently with functional limitations due to the deficits listed below (see PT Problem List). Mrs. Goers is very motivated to be as independent as possible at home and to ensure her husband does not get injured when assisting her at home.  PTA pt was ambulating household distances with RW and requiring assist for sponge bathing and dressing.  She currently requires mod assist for bed mobility and sit<>stand transfers and close min guard assist to ambulate 15 ft in room with RW.  Extensive education provided to pt and husband on safety in home (see general comments below). Pt will benefit from skilled PT to increase their independence and safety with mobility to allow discharge to the venue listed below.      Follow Up Recommendations Home health PT;Supervision/Assistance - 24 hour    Equipment Recommendations  Wheelchair (measurements PT);Wheelchair cushion (measurements PT);Hospital bed    Recommendations for Other Services OT consult     Precautions / Restrictions Precautions Precautions: Fall Restrictions Weight Bearing Restrictions: No      Mobility  Bed Mobility Overal bed mobility: Needs Assistance Bed Mobility: Supine to Sit     Supine to sit: Mod assist;HOB elevated     General bed mobility comments: Pt encouraged to use bed rails which she does, but also requires assist to advance BLEs to EOB and to  elevate trunk.  Instructions provided to husband on proper technique who is watching.  Bed pad was used to assist pt in scooting to EOB.    Transfers Overall transfer level: Needs assistance Equipment used: Rolling walker (2 wheeled) Transfers: Sit to/from Stand Sit to Stand: Mod assist         General transfer comment: Assist at ischial tuberosities to boost to standing and to steady.  Cues for hand placement and safe technique.    Ambulation/Gait Ambulation/Gait assistance: Min guard Ambulation Distance (Feet): 15 Feet Assistive device: Rolling walker (2 wheeled) Gait Pattern/deviations: Step-through pattern;Decreased stride length;Trunk flexed Gait velocity: decreased Gait velocity interpretation: Below normal speed for age/gender General Gait Details: Pt ambulated in room using RW with husband providing very close min guard assist and this PT at other side of pt providing close supervision and coaching husband in best safe technique to provide min guard assist when ambulating.   Stairs            Information systems manager mobility: Yes Wheelchair propulsion: Both upper extremities Wheelchair parts: Supervision/cueing Distance: 35 Wheelchair Assistance Details (indicate cue type and reason): Pt able to push WC manually with BUEs from room out into hallway.  Was able to navigate through tight spaces in room with supervision to be careful about not trapping her fingers on the doorframe or on objects in the room.  Pt performed 3 point turn in hallway with supervision.  Pt and husband verbalized and demonstrated understanding safe use of WC.  Modified Rankin (Stroke Patients Only)  Balance Overall balance assessment: Needs assistance Sitting-balance support: No upper extremity supported;Feet supported Sitting balance-Leahy Scale: Fair Sitting balance - Comments: Pt able to sit EOB without assist but would not be able to tolerate  perturbations   Standing balance support: Bilateral upper extremity supported;During functional activity Standing balance-Leahy Scale: Poor Standing balance comment: Relies on RW for support                             Pertinent Vitals/Pain Pain Assessment: Faces Faces Pain Scale: Hurts even more Pain Location: sacral decubitus ulcer Pain Descriptors / Indicators: Grimacing;Guarding Pain Intervention(s): Limited activity within patient's tolerance;Monitored during session;Repositioned    Home Living Family/patient expects to be discharged to:: Private residence Living Arrangements: Spouse/significant other Available Help at Discharge: Family;Available 24 hours/day Type of Home: House Home Access: Stairs to enter Entrance Stairs-Rails: Can reach both;Left;Right Entrance Stairs-Number of Steps: 3 Home Layout: Two level;Able to live on main level with bedroom/bathroom (plans to have hospital bed downstairs) Home Equipment: Gilford Rile - 2 wheels;Cane - single point;Hand held shower head;Bedside commode;Shower seat (planning to have hospital bed and WC at d/c)      Prior Function Level of Independence: Needs assistance   Gait / Transfers Assistance Needed: Pt was able to ambulate household distances with RW.  She had a near fall in her MDs office the day before admission.  Pt reports she has had several near falls in the past year or so.  ADL's / Homemaking Assistance Needed: Needs assist for sponge bath (cannot do regular shower because she can't sit on shower seat due to ulcer).  Needs assist bathing.  Husband does the cooking, cleaning, driving.        Hand Dominance   Dominant Hand: Right    Extremity/Trunk Assessment   Upper Extremity Assessment Upper Extremity Assessment: Generalized weakness;Defer to OT evaluation (Strength grossly 3/5 BUE)    Lower Extremity Assessment Lower Extremity Assessment: Generalized weakness (Strength grossly 3-/5 BLEs)    Cervical  / Trunk Assessment Cervical / Trunk Assessment: Kyphotic  Communication   Communication: No difficulties  Cognition Arousal/Alertness: Awake/alert Behavior During Therapy: WFL for tasks assessed/performed Overall Cognitive Status: Within Functional Limits for tasks assessed                                        General Comments General comments (skin integrity, edema, etc.): Educated pt and husband on many tips for safety at home including: use and purchase of bed pads for in bed and in chair to assist with scooting, use and purchase of gait belt for safe transfers and ambulation, provided with and reviewed handout for ascending/descending stairs with WC as well as going up/down a curb with WC.      Exercises     Assessment/Plan    PT Assessment Patient needs continued PT services  PT Problem List Decreased strength;Decreased range of motion;Decreased activity tolerance;Decreased balance;Decreased mobility;Decreased knowledge of use of DME;Decreased safety awareness;Pain       PT Treatment Interventions DME instruction;Gait training;Stair training;Functional mobility training;Therapeutic activities;Therapeutic exercise;Balance training;Neuromuscular re-education;Patient/family education;Wheelchair mobility training    PT Goals (Current goals can be found in the Care Plan section)  Acute Rehab PT Goals Patient Stated Goal: to be as independent as she can and to be sure her husband will not get injured while assisting her at home PT Goal  Formulation: With patient/family Time For Goal Achievement: 03/27/17 Potential to Achieve Goals: Fair    Frequency Min 2X/week   Barriers to discharge        Co-evaluation               End of Session Equipment Utilized During Treatment: Gait belt;Oxygen Activity Tolerance: Patient tolerated treatment well;Patient limited by pain;Patient limited by fatigue Patient left: in chair;with call bell/phone within reach;with  chair alarm set;Other (comment);with family/visitor present (sitting on pillows and propped off of R buttocks ) Nurse Communication: Mobility status PT Visit Diagnosis: Muscle weakness (generalized) (M62.81);Unsteadiness on feet (R26.81);Difficulty in walking, not elsewhere classified (R26.2)    Time: 1333-1450 PT Time Calculation (min) (ACUTE ONLY): 77 min   Charges:   PT Evaluation $PT Eval High Complexity: 1 Procedure PT Treatments $Gait Training: 8-22 mins $Therapeutic Activity: 8-22 mins $Wheel Chair Management: 8-22 mins   PT G Codes:        Collie Siad PT, DPT 03/13/2017, 3:30 PM

## 2017-03-13 NOTE — Progress Notes (Signed)
Daily Progress Note   Patient Name: Rachel Butler       Date: 03/13/2017 DOB: 09-Mar-1956  Age: 61 y.o. MRN#: [id number] Attending Physician: Fritzi Mandes, MD Primary Care Physician: Viviana Simpler, MD Admit Date: 03/11/2017  Reason for Consultation/Follow-up: Establishing goals of care d/t significant decline with recent cancer diagnosis.    Subjective: Rachel Butler is lying in bed. Husband at bedside. No complaints. Pain controlled.   Length of Stay: 2  Current Medications: Scheduled Meds:  . cephALEXin  500 mg Oral Q12H  . collagenase   Topical Daily  . fentaNYL  25 mcg Transdermal Q72H  . furosemide  20 mg Oral Daily  . gabapentin  800 mg Oral TID  . Melatonin  2.5 mg Oral QHS  . multivitamin with minerals  1 tablet Oral Daily  . oxyCODONE  10 mg Oral Q12H  . polyethylene glycol  17 g Oral Daily  . potassium chloride  10 mEq Oral Daily  . senna-docusate  2 tablet Oral Daily  . sodium chloride flush  10-40 mL Intracatheter Q12H  . tiZANidine  4 mg Oral TID    Continuous Infusions:   PRN Meds: acetaminophen **OR** acetaminophen, bisacodyl, ondansetron **OR** ondansetron (ZOFRAN) IV, ondansetron, oxyCODONE, prochlorperazine, senna-docusate, sodium chloride flush  Physical Exam  Constitutional: She is oriented to person, place, and time. She appears well-developed. She appears cachectic. She appears ill.  HENT:  Head: Normocephalic and atraumatic.  Cardiovascular: Tachycardia present.   Pulmonary/Chest: Effort normal. No accessory muscle usage. No tachypnea. No respiratory distress.  Abdominal: She exhibits distension and ascites.  Neurological: She is alert and oriented to person, place, and time.  Nursing note and vitals reviewed.           Vital Signs: BP (!) 164/72    Pulse (!) 129   Temp 98.8 F (37.1 C) (Oral)   Resp 18   Ht 5' 3"  (1.6 m)   Wt 61.2 kg (135 lb)   SpO2 98%   BMI 23.91 kg/m  SpO2: SpO2: 98 % O2 Device: O2 Device: Nasal Cannula O2 Flow Rate: O2 Flow Rate (L/min): 3 L/min  Intake/output summary:  Intake/Output Summary (Last 24 hours) at 03/13/17 1029 Last data filed at 03/13/17 1021  Gross per 24 hour  Intake  850 ml  Output             3775 ml  Net            -2925 ml   LBM: Last BM Date: 03/11/17 Baseline Weight: Weight: 61.2 kg (135 lb) Most recent weight: Weight: 61.2 kg (135 lb)       Palliative Assessment/Data:    Flowsheet Rows     Most Recent Value  Intake Tab  Referral Department  Hospitalist  Unit at Time of Referral  Cardiac/Telemetry Unit  Palliative Care Primary Diagnosis  Cancer  Date Notified  03/11/17  Palliative Care Type  New Palliative care  Reason for referral  Clarify Goals of Care  Date of Admission  03/11/17  # of days IP prior to Palliative referral  0  Clinical Assessment  Psychosocial & Spiritual Assessment  Palliative Care Outcomes      Patient Active Problem List   Diagnosis Date Noted  . DNR (do not resuscitate) discussion   . Palliative care encounter   . Pressure injury of skin 03/12/2017  . Malignant cachexia ([place])   . CHF (congestive heart failure) ([place]) 03/11/2017  . COPD (chronic obstructive pulmonary disease) ([place]) 02/25/2017  . Hypotension 02/22/2017  . Tobacco use 02/22/2017  . Decubitus ulcer of sacral region, stage 2 02/17/2017  . Cancer related pain 02/17/2017  . Thrush 02/17/2017  . Hypomagnesemia 02/17/2017  . 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 Anemia 02/08/2017  . Encounter for antineoplastic chemotherapy 02/08/2017  . 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 Goals of care, counseling/discussion 01/28/2017  . Mitral valve mass   . Liver metastases ([place]) 01/16/2017  . Intermittent claudication  ([place]) 01/09/2017  . Neuropathy ([place]) 12/29/2015  . Chronic diastolic heart failure ([place]) 08/22/2015  . Generalized edema 08/17/2015  . Preventative health care 12/20/2014  . Advance directive discussed with patient 12/20/2014  . Mild malnutrition ([place]) 12/07/2014  . Chronic pain 12/05/2014  . Nicotine dependence   . Cervical disc disease   . Lumbar disc disease   . Migraines     Palliative Care Assessment & Plan   HPI: 61 y.o. 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  Assessment: I met this morning with Rachel Butler and her husband, Rachel Butler. Clarified desire for home with hospice although they are nervous about Rachel Butler being able to care for her at home. Will need max support and equipment from hospice. Has dressing changes to sacrum MWF. Was working to get wheelchair at home via home health. Will need hospital bed and oxygen prior to discharge. Would benefit from some PT in hospital and at home to help Rachel Butler with safe transfers, possibly even a lift chair. May need to consider foley with lasix and stage 3 sacral wound as well as for comfort management at home.   Rachel Butler has decided on DNR and wants to focus on enjoying her time at home for the time she has left. Will need continuing conversations as far as prognosis and aggressiveness of care as she progresses at EOL. They are overwhelmed. Educated Rachel Butler on self care and hospice bereavement services.   Recommendations/Plan:  Continue current pain regimen.   Continue wound care.  Consider peritoneal drain in future.    PT to help with transfer techniques for patient and family.  Goals of Care and Additional Recommendations:  Limitations on Scope of Treatment: Avoid Hospitalization and No Chemotherapy  Code Status:  DNR  Prognosis:   < 6  months  Discharge Planning:  Home with Hospice    Thank you for allowing the Palliative Medicine Team to assist in the care of this patient.   Total Time 60mn Prolonged Time Billed  no       Greater than 50%  of this time was spent counseling and coordinating care related to the above assessment and plan.  Rachel Sill NP Palliative Medicine Team Pager # [phone](M-F 8a-5p) Team Phone # [phone](Nights/Weekends)

## 2017-03-13 NOTE — Progress Notes (Signed)
Patient discharged home via car with husband. Patient escorted to car with nurse tech. Patient stable at time of discharged, no distress noted. Port deaccesed, belongings and patient education packet with patient. No further concerns.    Hampton Abbot, Lysle Rubens

## 2017-03-13 NOTE — Discharge Instructions (Signed)
Hospice to follow

## 2017-03-13 NOTE — Clinical Social Work Note (Signed)
CSW received referral.  Case discussed with case manager and plan is to discharge home with hospice.  CSW to sign off please re-consult if social work needs arise.  Rachel Butler. [place], MSW, [place]

## 2017-03-14 DIAGNOSIS — C787 Secondary malignant neoplasm of liver and intrahepatic bile duct: Secondary | ICD-10-CM | POA: Diagnosis not present

## 2017-03-14 DIAGNOSIS — Z6822 Body mass index (BMI) 22.0-22.9, adult: Secondary | ICD-10-CM | POA: Diagnosis not present

## 2017-03-14 DIAGNOSIS — L89150 Pressure ulcer of sacral region, unstageable: Secondary | ICD-10-CM | POA: Diagnosis not present

## 2017-03-14 DIAGNOSIS — C50919 Malignant neoplasm of unspecified site of unspecified female breast: Secondary | ICD-10-CM | POA: Diagnosis not present

## 2017-03-14 DIAGNOSIS — F17210 Nicotine dependence, cigarettes, uncomplicated: Secondary | ICD-10-CM | POA: Diagnosis not present

## 2017-03-14 DIAGNOSIS — Z452 Encounter for adjustment and management of vascular access device: Secondary | ICD-10-CM | POA: Diagnosis not present

## 2017-03-14 DIAGNOSIS — Z9981 Dependence on supplemental oxygen: Secondary | ICD-10-CM | POA: Diagnosis not present

## 2017-03-14 DIAGNOSIS — I509 Heart failure, unspecified: Secondary | ICD-10-CM | POA: Diagnosis not present

## 2017-03-15 ENCOUNTER — Inpatient Hospital Stay: Payer: Medicare Other

## 2017-03-15 DIAGNOSIS — I509 Heart failure, unspecified: Secondary | ICD-10-CM | POA: Diagnosis not present

## 2017-03-15 DIAGNOSIS — C50919 Malignant neoplasm of unspecified site of unspecified female breast: Secondary | ICD-10-CM | POA: Diagnosis not present

## 2017-03-15 DIAGNOSIS — F17210 Nicotine dependence, cigarettes, uncomplicated: Secondary | ICD-10-CM | POA: Diagnosis not present

## 2017-03-15 DIAGNOSIS — C787 Secondary malignant neoplasm of liver and intrahepatic bile duct: Secondary | ICD-10-CM | POA: Diagnosis not present

## 2017-03-15 DIAGNOSIS — L89150 Pressure ulcer of sacral region, unstageable: Secondary | ICD-10-CM | POA: Diagnosis not present

## 2017-03-15 DIAGNOSIS — Z452 Encounter for adjustment and management of vascular access device: Secondary | ICD-10-CM | POA: Diagnosis not present

## 2017-03-16 DIAGNOSIS — F17210 Nicotine dependence, cigarettes, uncomplicated: Secondary | ICD-10-CM | POA: Diagnosis not present

## 2017-03-16 DIAGNOSIS — I509 Heart failure, unspecified: Secondary | ICD-10-CM | POA: Diagnosis not present

## 2017-03-16 DIAGNOSIS — C50919 Malignant neoplasm of unspecified site of unspecified female breast: Secondary | ICD-10-CM | POA: Diagnosis not present

## 2017-03-16 DIAGNOSIS — C787 Secondary malignant neoplasm of liver and intrahepatic bile duct: Secondary | ICD-10-CM | POA: Diagnosis not present

## 2017-03-16 DIAGNOSIS — Z452 Encounter for adjustment and management of vascular access device: Secondary | ICD-10-CM | POA: Diagnosis not present

## 2017-03-16 DIAGNOSIS — L89150 Pressure ulcer of sacral region, unstageable: Secondary | ICD-10-CM | POA: Diagnosis not present

## 2017-03-17 DIAGNOSIS — Z452 Encounter for adjustment and management of vascular access device: Secondary | ICD-10-CM | POA: Diagnosis not present

## 2017-03-17 DIAGNOSIS — F17210 Nicotine dependence, cigarettes, uncomplicated: Secondary | ICD-10-CM | POA: Diagnosis not present

## 2017-03-17 DIAGNOSIS — I509 Heart failure, unspecified: Secondary | ICD-10-CM | POA: Diagnosis not present

## 2017-03-17 DIAGNOSIS — L89150 Pressure ulcer of sacral region, unstageable: Secondary | ICD-10-CM | POA: Diagnosis not present

## 2017-03-17 DIAGNOSIS — C50919 Malignant neoplasm of unspecified site of unspecified female breast: Secondary | ICD-10-CM | POA: Diagnosis not present

## 2017-03-17 DIAGNOSIS — C787 Secondary malignant neoplasm of liver and intrahepatic bile duct: Secondary | ICD-10-CM | POA: Diagnosis not present

## 2017-03-18 ENCOUNTER — Telehealth: Payer: Self-pay | Admitting: *Deleted

## 2017-03-18 DIAGNOSIS — C787 Secondary malignant neoplasm of liver and intrahepatic bile duct: Secondary | ICD-10-CM | POA: Diagnosis not present

## 2017-03-18 DIAGNOSIS — Z452 Encounter for adjustment and management of vascular access device: Secondary | ICD-10-CM | POA: Diagnosis not present

## 2017-03-18 DIAGNOSIS — L89150 Pressure ulcer of sacral region, unstageable: Secondary | ICD-10-CM | POA: Diagnosis not present

## 2017-03-18 DIAGNOSIS — I509 Heart failure, unspecified: Secondary | ICD-10-CM | POA: Diagnosis not present

## 2017-03-18 DIAGNOSIS — F17210 Nicotine dependence, cigarettes, uncomplicated: Secondary | ICD-10-CM | POA: Diagnosis not present

## 2017-03-18 DIAGNOSIS — C50919 Malignant neoplasm of unspecified site of unspecified female breast: Secondary | ICD-10-CM | POA: Diagnosis not present

## 2017-03-18 NOTE — Telephone Encounter (Signed)
I have called and left hospice a message (toni).  I told her to increase patch to 50 mcg. Stop the oxycontin and hydrocodone.  Breakthrough pain take oxycodone 10 mg every 4hours.  If in a few days pt pain not relieved then she can increase the ocycodone IR to 15 mg every 4 hours .If the oxycodone does not work she can always switch to IR morphine 15 mg q4 hours.  Should make sure that pain clinic does not give any more pain  Med. I got a call back from [place] with hospice and to her knowledge the pt is not taking hydrocodone.  She asked if I could call the pain clinic and let them know.  I called [facility] and pt has seen Dr. Mechele Dawley as well as PA Smart. And I spoke to [place] and explained that her cancer is worse and she has huge wound on her back side and whe is in hospice and we are giving medications and we are requesting no more pain med rx be given to pt. Hospice will manage her pain.  She will pass message along to pain doctors and call me if they have questions

## 2017-03-18 NOTE — Telephone Encounter (Signed)
We can increase patch to 50 mcg. Stop oxycontin. Stop hydrocodone. We can reassess her pain in a few days. If she still has pain can increase oxycodone to 15 mg IR q4 prn for pain. Pain clinic should not be giving her meds at this point. If oxycodone is not helping switching to IR morphine 15 mg Q4 prn and see if it helps

## 2017-03-18 NOTE — Telephone Encounter (Signed)
Asking for adjustments to her pain meds, which are not controlling her pain. She is currently on Fentanyl 25 mcg, OxyContin 10 mg q 12 h and Oxycodone IR 10 mg every 4 hours PRN. Requesting to increase Fentanyl dose and stop the OxyContin and change her Oxycodone. We gave her a prescription for Oxycodone IR 10 q 4 h PRN on 3/30. Per Care Everywhere she got Fentanyl 25 mcg and Hydrocodone 5/325 mg from Pain clinic on 4/10. Hospice is unaware of Hydrocodone  And states patient has 3 boxes of Fentanyl 25 mcg on hand. Please advise

## 2017-03-19 DIAGNOSIS — C50919 Malignant neoplasm of unspecified site of unspecified female breast: Secondary | ICD-10-CM | POA: Diagnosis not present

## 2017-03-19 DIAGNOSIS — Z452 Encounter for adjustment and management of vascular access device: Secondary | ICD-10-CM | POA: Diagnosis not present

## 2017-03-19 DIAGNOSIS — I509 Heart failure, unspecified: Secondary | ICD-10-CM | POA: Diagnosis not present

## 2017-03-19 DIAGNOSIS — L89150 Pressure ulcer of sacral region, unstageable: Secondary | ICD-10-CM | POA: Diagnosis not present

## 2017-03-19 DIAGNOSIS — C787 Secondary malignant neoplasm of liver and intrahepatic bile duct: Secondary | ICD-10-CM | POA: Diagnosis not present

## 2017-03-19 DIAGNOSIS — F17210 Nicotine dependence, cigarettes, uncomplicated: Secondary | ICD-10-CM | POA: Diagnosis not present

## 2017-03-20 DIAGNOSIS — C787 Secondary malignant neoplasm of liver and intrahepatic bile duct: Secondary | ICD-10-CM | POA: Diagnosis not present

## 2017-03-20 DIAGNOSIS — I509 Heart failure, unspecified: Secondary | ICD-10-CM | POA: Diagnosis not present

## 2017-03-20 DIAGNOSIS — L89150 Pressure ulcer of sacral region, unstageable: Secondary | ICD-10-CM | POA: Diagnosis not present

## 2017-03-20 DIAGNOSIS — F17210 Nicotine dependence, cigarettes, uncomplicated: Secondary | ICD-10-CM | POA: Diagnosis not present

## 2017-03-20 DIAGNOSIS — Z452 Encounter for adjustment and management of vascular access device: Secondary | ICD-10-CM | POA: Diagnosis not present

## 2017-03-20 DIAGNOSIS — C50919 Malignant neoplasm of unspecified site of unspecified female breast: Secondary | ICD-10-CM | POA: Diagnosis not present

## 2017-03-21 ENCOUNTER — Ambulatory Visit: Admitting: Internal Medicine

## 2017-03-21 ENCOUNTER — Encounter: Payer: Self-pay | Admitting: Internal Medicine

## 2017-03-21 ENCOUNTER — Ambulatory Visit: Payer: Medicare Other | Admitting: Family

## 2017-03-21 VITALS — BP 116/56 | HR 108 | Resp 18

## 2017-03-21 DIAGNOSIS — I5032 Chronic diastolic (congestive) heart failure: Secondary | ICD-10-CM

## 2017-03-21 DIAGNOSIS — C50919 Malignant neoplasm of unspecified site of unspecified female breast: Secondary | ICD-10-CM | POA: Diagnosis not present

## 2017-03-21 DIAGNOSIS — C787 Secondary malignant neoplasm of liver and intrahepatic bile duct: Secondary | ICD-10-CM | POA: Diagnosis not present

## 2017-03-21 DIAGNOSIS — R188 Other ascites: Secondary | ICD-10-CM

## 2017-03-21 DIAGNOSIS — Z452 Encounter for adjustment and management of vascular access device: Secondary | ICD-10-CM | POA: Diagnosis not present

## 2017-03-21 DIAGNOSIS — R64 Cachexia: Secondary | ICD-10-CM

## 2017-03-21 DIAGNOSIS — L98429 Non-pressure chronic ulcer of back with unspecified severity: Secondary | ICD-10-CM | POA: Diagnosis not present

## 2017-03-21 DIAGNOSIS — L89150 Pressure ulcer of sacral region, unstageable: Secondary | ICD-10-CM | POA: Diagnosis not present

## 2017-03-21 DIAGNOSIS — K7460 Unspecified cirrhosis of liver: Secondary | ICD-10-CM | POA: Diagnosis not present

## 2017-03-21 DIAGNOSIS — G629 Polyneuropathy, unspecified: Secondary | ICD-10-CM

## 2017-03-21 DIAGNOSIS — K7290 Hepatic failure, unspecified without coma: Secondary | ICD-10-CM | POA: Insufficient documentation

## 2017-03-21 DIAGNOSIS — I509 Heart failure, unspecified: Secondary | ICD-10-CM | POA: Diagnosis not present

## 2017-03-21 DIAGNOSIS — F17210 Nicotine dependence, cigarettes, uncomplicated: Secondary | ICD-10-CM | POA: Diagnosis not present

## 2017-03-21 MED ORDER — GABAPENTIN 800 MG PO TABS: 1600.0000 mg | ORAL_TABLET | Freq: Every day | ORAL | 0 refills | Status: AC

## 2017-03-21 NOTE — Assessment & Plan Note (Addendum)
Breathing has been okay Now has liver failure as well

## 2017-03-21 NOTE — Assessment & Plan Note (Signed)
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

## 2017-03-21 NOTE — Assessment & Plan Note (Signed)
Eating better now Hopefully can gain back some weight

## 2017-03-21 NOTE — Assessment & Plan Note (Signed)
New ascites and increased edema No mental status changes now

## 2017-03-21 NOTE — Assessment & Plan Note (Signed)
Hard to separate this out from everything else I am afraid her meds for this are increasing daytime sedation Will change gabapentin to 2 at bedtime only Tizanidine prn only

## 2017-03-21 NOTE — Progress Notes (Signed)
Subjective:    Patient ID: Rachel Butler, female    DOB: 05-11-1956, 61 y.o.   MRN: [id number]  HPI Home visit for follow up of metastatic cancer Son/DIL are visiting here from [place] her hospital records Seemed to do poorly with the chemo and it didn't help Now home on hospice care  Eating reasonably well Weight still down Still has decubitus on sacrum--now on different treatment through hospice  Off oxycontin--now on increased fentanyl patch Has oxycodone for prn use Still on gabapentin for neuropathic pain---- not sure how this is working now Somnolence is common  No SOB Did have the oxygen put up to 3l/min----but no dyspnea No chest pain Occasional cough--still smoking  Current Outpatient Prescriptions on File Prior to Visit  Medication Sig Dispense Refill  . clotrimazole (MYCELEX) 10 MG troche Take 1 lozenge (10 mg total) by mouth 5 (five) times daily. 50 lozenge 0  . fentaNYL (DURAGESIC - DOSED MCG/HR) 25 MCG/HR patch Place 50 mcg onto the skin every 3 (three) days.   0  . furosemide (LASIX) 20 MG tablet Take 1 tablet (20 mg total) by mouth daily. 30 tablet 5  . gabapentin (NEURONTIN) 800 MG tablet Take 1 tablet (800 mg total) by mouth 3 (three) times daily. 90 tablet 11  . LORazepam (ATIVAN) 0.5 MG tablet Take 1 tablet (0.5 mg total) by mouth every 6 (six) hours as needed (Nausea or vomiting). 30 tablet 0  . Melatonin 2.5 MG CAPS Take 1 capsule by mouth at bedtime.    . ondansetron (ZOFRAN) 8 MG tablet Take 1 tablet (8 mg total) by mouth 2 (two) times daily as needed for refractory nausea / vomiting. Start on day 3 after chemo. 30 tablet 1  . Oxycodone HCl 10 MG TABS Take 1 tablet (10 mg total) by mouth every 4 (four) hours as needed. 60 tablet 0  . polyethylene glycol (MIRALAX / GLYCOLAX) packet Take 17 g by mouth daily. (Patient taking differently: Take 17 g by mouth daily as needed. ) 14 each 0  . potassium chloride (K-DUR) 10 MEQ tablet Take 1 tablet  (10 mEq total) by mouth daily. 30 tablet 5  . prochlorperazine (COMPAZINE) 10 MG tablet Take 1 tablet (10 mg total) by mouth every 6 (six) hours as needed (Nausea or vomiting). 30 tablet 1  . promethazine (PHENERGAN) 25 MG tablet TAKE 1/2 TO 1 TABLETS BY MOUTH 3 TIMES DAILY AS NEEDED FOR NAUSEA 30 tablet 0  . sennosides-docusate sodium (SENOKOT-S) 8.6-50 MG tablet Take 2 tablets by mouth daily.    Marland Kitchen tiZANidine (ZANAFLEX) 4 MG capsule Take 4 mg by mouth 3 (three) times daily. Takes 1 tid and up to 3 tablets at bedtime to sleep     No current facility-administered medications on file prior to visit.     Allergies  Allergen Reactions  . Indomethacin Other (See Comments)    Can not urinate  . Penicillins Rash  . Robaxin [Methocarbamol] Other (See Comments)    Urinary retention  . Nubain [Nalbuphine Hcl]     Past Medical History:  Diagnosis Date  . Anemia   . Arthritis   . Blood transfusion without reported diagnosis   . Cancer ([place])   . Cervical disc disease   . CHF (congestive heart failure) ([place]) 03/11/2017  . COPD (chronic obstructive pulmonary disease) ([place])   . Intervertebral disk disease   . Lumbar disc disease   . Migraines   . Nicotine dependence   .  UTI (urinary tract infection)     Past Surgical History:  Procedure Laterality Date  . ABDOMINAL HYSTERECTOMY  1986   right tube and ovary removed--then laparoscopy after for ?retained cyst?  . CERVICAL DISCECTOMY  1993   C5-6  . CESAREAN SECTION  [id number]  . COLONOSCOPY    . disk removal     c5c6  . IR GENERIC HISTORICAL  01/18/2017   IR EMBO ART  VEN HEMORR LYMPH EXTRAV  INC GUIDE ROADMAPPING 01/18/2017 Greggory Keen, MD [facility]  . IR GENERIC HISTORICAL  01/18/2017   IR ANGIOGRAM SELECTIVE EACH ADDITIONAL VESSEL 01/18/2017 Greggory Keen, MD [facility]  . IR GENERIC HISTORICAL  01/18/2017   IR ANGIOGRAM SELECTIVE EACH ADDITIONAL VESSEL 01/18/2017 Greggory Keen, MD [facility]  . IR GENERIC HISTORICAL   01/18/2017   IR US GUIDE VASC ACCESS RIGHT 01/18/2017 Greggory Keen, MD [facility]  . IR GENERIC HISTORICAL  01/18/2017   IR ANGIOGRAM VISCERAL SELECTIVE 01/18/2017 Greggory Keen, MD [facility]  . IR GENERIC HISTORICAL  01/18/2017   IR ANGIOGRAM SELECTIVE EACH ADDITIONAL VESSEL 01/18/2017 Greggory Keen, MD [facility]  . IR GENERIC HISTORICAL  01/18/2017   IR ANGIOGRAM VISCERAL SELECTIVE 01/18/2017 Greggory Keen, MD [facility]  . IR GENERIC HISTORICAL  02/06/2017   IR FLUORO GUIDE PORT INSERTION RIGHT 02/06/2017 Marybelle Killings, MD [facility]  . TEE WITHOUT CARDIOVERSION N/A 01/24/2017   Procedure: TRANSESOPHAGEAL ECHOCARDIOGRAM (TEE)  WITH ANESTHESIA;  Surgeon: Sueanne Margarita, MD;  Location: [facility];  Service: Cardiovascular;  Laterality: N/A;  . [place]  . TUBAL LIGATION      Family History  Problem Relation Age of Onset  . Cancer Mother   . COPD Father   . Heart disease Father   . Hypertension Father   . Thyroid disease Other   . Cancer Other     mother's side  . Heart disease Other     Dad's side  . Colon cancer Neg Hx     Social History   Social History  . Marital status: Married    Spouse name: N/A  . Number of children: 2  . Years of education: N/A   Occupational History  . Social research officer, government ---retired   . 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 Smoking status: Current Every Day Smoker    Packs/day: 1.00    Years: 40.00    Types: Cigarettes  . Smokeless tobacco: Never Used  . Alcohol use No  . Drug use: No  . Sexual activity: Not on file   Other Topics Concern  . Not on file   Social History Narrative   [name] daughter      Has living will   Husband is health care POA   DNR done 03/21/17   No tube feeds if cognitively unaware      Review of Systems Still has subclavian port--right Sleeping okay at night Bowels okay on senna. Has the miralax for prn    Objective:   Physical Exam    Constitutional: No distress.  Wasted New hair loss  Neck: No thyromegaly present.  Cardiovascular: Regular rhythm.  Exam reveals no gallop.   No murmur heard. Slightly fast  Pulmonary/Chest: She has no wheezes. She has no rales.  Decreased breath sounds but clear  Abdominal:  Soft Mild distention with apparent ascites Mild tenderness  Musculoskeletal:  2+ pitting edema in feet  Lymphadenopathy:  She has no cervical adenopathy.  Skin:  Large sacral wound--at least stage 3. Lots of black eschar. Some redness and smell from necrotic tissue. Not clearly infected (but may be)  Psychiatric:  Mood seems level          Assessment & Plan:

## 2017-03-21 NOTE — Assessment & Plan Note (Signed)
Didn't tolerate the chemo and it didn't work Now on hospice care

## 2017-03-22 ENCOUNTER — Inpatient Hospital Stay: Payer: Medicare Other | Admitting: Oncology

## 2017-03-22 ENCOUNTER — Inpatient Hospital Stay: Payer: Medicare Other

## 2017-03-22 ENCOUNTER — Other Ambulatory Visit: Payer: Self-pay | Admitting: *Deleted

## 2017-03-22 DIAGNOSIS — C50919 Malignant neoplasm of unspecified site of unspecified female breast: Secondary | ICD-10-CM

## 2017-03-22 DIAGNOSIS — C787 Secondary malignant neoplasm of liver and intrahepatic bile duct: Secondary | ICD-10-CM

## 2017-03-22 DIAGNOSIS — I509 Heart failure, unspecified: Secondary | ICD-10-CM | POA: Diagnosis not present

## 2017-03-22 DIAGNOSIS — F17210 Nicotine dependence, cigarettes, uncomplicated: Secondary | ICD-10-CM | POA: Diagnosis not present

## 2017-03-22 DIAGNOSIS — Z452 Encounter for adjustment and management of vascular access device: Secondary | ICD-10-CM | POA: Diagnosis not present

## 2017-03-22 DIAGNOSIS — L89150 Pressure ulcer of sacral region, unstageable: Secondary | ICD-10-CM | POA: Diagnosis not present

## 2017-03-22 MED ORDER — OXYCODONE HCL 10 MG PO TABS: 10.0000 mg | ORAL_TABLET | ORAL | 0 refills | Status: AC | PRN

## 2017-03-25 ENCOUNTER — Telehealth: Payer: Self-pay | Admitting: Internal Medicine

## 2017-03-25 DIAGNOSIS — Z452 Encounter for adjustment and management of vascular access device: Secondary | ICD-10-CM | POA: Diagnosis not present

## 2017-03-25 DIAGNOSIS — L89150 Pressure ulcer of sacral region, unstageable: Secondary | ICD-10-CM | POA: Diagnosis not present

## 2017-03-25 DIAGNOSIS — F17210 Nicotine dependence, cigarettes, uncomplicated: Secondary | ICD-10-CM | POA: Diagnosis not present

## 2017-03-25 DIAGNOSIS — C787 Secondary malignant neoplasm of liver and intrahepatic bile duct: Secondary | ICD-10-CM | POA: Diagnosis not present

## 2017-03-25 DIAGNOSIS — I509 Heart failure, unspecified: Secondary | ICD-10-CM | POA: Diagnosis not present

## 2017-03-25 DIAGNOSIS — C50919 Malignant neoplasm of unspecified site of unspecified female breast: Secondary | ICD-10-CM | POA: Diagnosis not present

## 2017-03-25 NOTE — Telephone Encounter (Signed)
Forms in Dr Alla German InBox on his desk

## 2017-03-25 NOTE — Telephone Encounter (Signed)
Rachel Butler dropped off form to be filled out for [facility]. Placed in [place] tower.

## 2017-03-26 DIAGNOSIS — Z452 Encounter for adjustment and management of vascular access device: Secondary | ICD-10-CM | POA: Diagnosis not present

## 2017-03-26 DIAGNOSIS — C787 Secondary malignant neoplasm of liver and intrahepatic bile duct: Secondary | ICD-10-CM | POA: Diagnosis not present

## 2017-03-26 DIAGNOSIS — I509 Heart failure, unspecified: Secondary | ICD-10-CM | POA: Diagnosis not present

## 2017-03-26 DIAGNOSIS — Z6822 Body mass index (BMI) 22.0-22.9, adult: Secondary | ICD-10-CM | POA: Diagnosis not present

## 2017-03-26 DIAGNOSIS — L89150 Pressure ulcer of sacral region, unstageable: Secondary | ICD-10-CM | POA: Diagnosis not present

## 2017-03-26 DIAGNOSIS — F17210 Nicotine dependence, cigarettes, uncomplicated: Secondary | ICD-10-CM | POA: Diagnosis not present

## 2017-03-26 DIAGNOSIS — Z9981 Dependence on supplemental oxygen: Secondary | ICD-10-CM | POA: Diagnosis not present

## 2017-03-26 DIAGNOSIS — C50919 Malignant neoplasm of unspecified site of unspecified female breast: Secondary | ICD-10-CM | POA: Diagnosis not present

## 2017-03-26 NOTE — Telephone Encounter (Signed)
Form done No charge 

## 2017-03-27 ENCOUNTER — Telehealth: Payer: Self-pay | Admitting: *Deleted

## 2017-03-27 DIAGNOSIS — C787 Secondary malignant neoplasm of liver and intrahepatic bile duct: Secondary | ICD-10-CM | POA: Diagnosis not present

## 2017-03-27 DIAGNOSIS — Z452 Encounter for adjustment and management of vascular access device: Secondary | ICD-10-CM | POA: Diagnosis not present

## 2017-03-27 DIAGNOSIS — I509 Heart failure, unspecified: Secondary | ICD-10-CM | POA: Diagnosis not present

## 2017-03-27 DIAGNOSIS — L89150 Pressure ulcer of sacral region, unstageable: Secondary | ICD-10-CM | POA: Diagnosis not present

## 2017-03-27 DIAGNOSIS — C50919 Malignant neoplasm of unspecified site of unspecified female breast: Secondary | ICD-10-CM | POA: Diagnosis not present

## 2017-03-27 DIAGNOSIS — F17210 Nicotine dependence, cigarettes, uncomplicated: Secondary | ICD-10-CM | POA: Diagnosis not present

## 2017-03-27 NOTE — Telephone Encounter (Signed)
Pain is uncontrolled. Asking if they can increase her Oxycodone to 15 mg q 4 h prn. She is also on Fentanyl 50 mcg. Please advise

## 2017-03-27 NOTE — Telephone Encounter (Signed)
Left message for Rachel Butler regarding increasing Fentanyl dose to 75 mcg, if not improved in 4 - 5 days we will then increase her Oxycodone dose; and to call me back to let me know if she needs a prescription written

## 2017-03-27 NOTE — Telephone Encounter (Signed)
I left a message on [name]'s voice mail to pick up form.

## 2017-03-27 NOTE — Telephone Encounter (Signed)
Can increase patch to 75 mcg. If pain not controlled after 4-5 days can increase oxycodone.

## 2017-03-28 ENCOUNTER — Telehealth: Payer: Self-pay | Admitting: *Deleted

## 2017-03-28 ENCOUNTER — Telehealth: Payer: Self-pay

## 2017-04-26 NOTE — Telephone Encounter (Signed)
Please let her know that I am not planning to make another visit till next month. She can speak to hospice about alternatives---but I don't think she is ready for the hospice home, so a skilled nursing facility is the only other choice. Please call hospice and ask the social worker to talk to her about her concerns I can call her later if needed

## 2017-04-26 NOTE — Telephone Encounter (Signed)
Not surprising--may be a blessing to relieve her suffering. Will try to reach husband to offer condolences

## 2017-04-26 NOTE — Telephone Encounter (Signed)
Patient expired at 1:13 AM at home.

## 2017-04-26 NOTE — Telephone Encounter (Signed)
Spoke to [place]. She informed me the passed early this morning. She said thank you for everything.

## 2017-04-26 NOTE — Telephone Encounter (Signed)
Hi Dr. Silvio Pate,  My little sister Holiday Mcmenamin is also a patient of yours and is under Hospice Care at home. She has liver cancer and you are going to her home for visits. I was hoping I could meet you there if you go this week. I really need to talk to you about her care at home. Her husband is NOT capable of taking care of her and I need to know what options she has besides staying at home with him. We have all agreed she needs better care and the whole family is leaving it up to me to do something. I am lost as to what to do. Antara did tell me I have authorization to talk to you or others about her health. Can you please call me or let me know when your visit to her home will be? My cell # is [phone]...my home #[phone].  If I do not have authorization to speak with you please let me know that too in a message back.    Thanks.  Blythe Stanford   She is on her DPR.

## 2017-04-26 DEATH — deceased

## 2017-04-29 ENCOUNTER — Ambulatory Visit: Payer: Medicare Other | Admitting: Internal Medicine

## 2017-05-06 ENCOUNTER — Telehealth: Payer: Self-pay | Admitting: Internal Medicine

## 2017-05-06 NOTE — Telephone Encounter (Signed)
Form placed in Dr Alla German Inbox on his desk with a yellow sticky to let him know which page is his to fill out.

## 2017-05-06 NOTE — Telephone Encounter (Signed)
Jori Moll dropped off forms that need to be filled out regarding Rachel Butler. Placed in [place] tower. Can reach Jori Moll at [phone]

## 2017-05-06 NOTE — Telephone Encounter (Signed)
Form done No charge 

## 2017-05-06 NOTE — Telephone Encounter (Signed)
Patient's husband notified form is ready for pick up.

## 2017-05-23 ENCOUNTER — Ambulatory Visit: Payer: Medicare Other | Admitting: Internal Medicine

## 2017-06-01 ENCOUNTER — Other Ambulatory Visit: Payer: Self-pay | Admitting: Nurse Practitioner

## 2017-06-03 IMAGING — US US BIOPSY
1 series · 8 of 8 positions shown · non-contrast
Comparison: none

INDICATION: LIVER METASTASES, UNKNOWN PRIMARY.

[Series 1: us biopsy · 0.19mm/px · 8 of 8 slices shown]
[im 1/8]
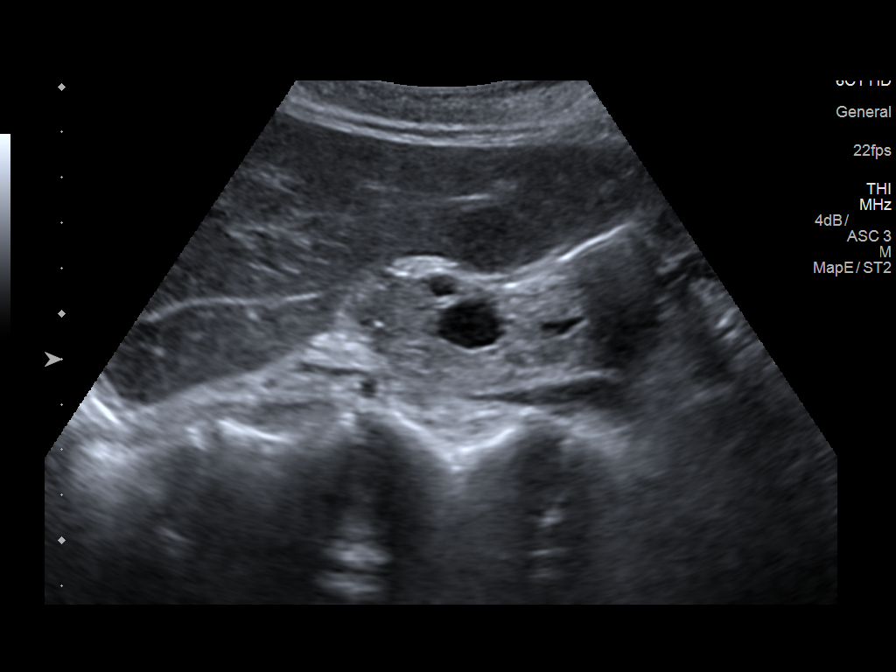
[im 2/8]
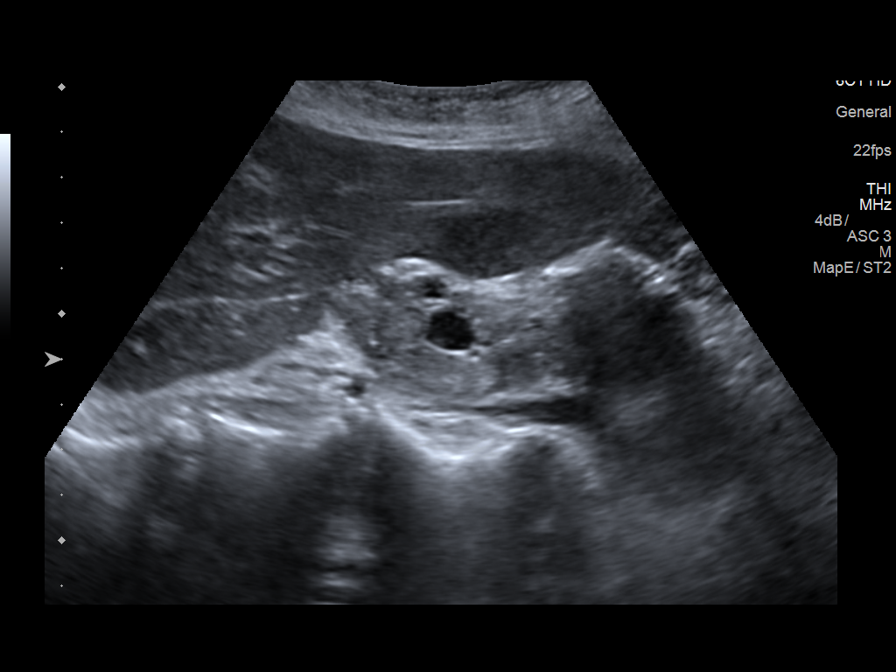
[im 3/8]
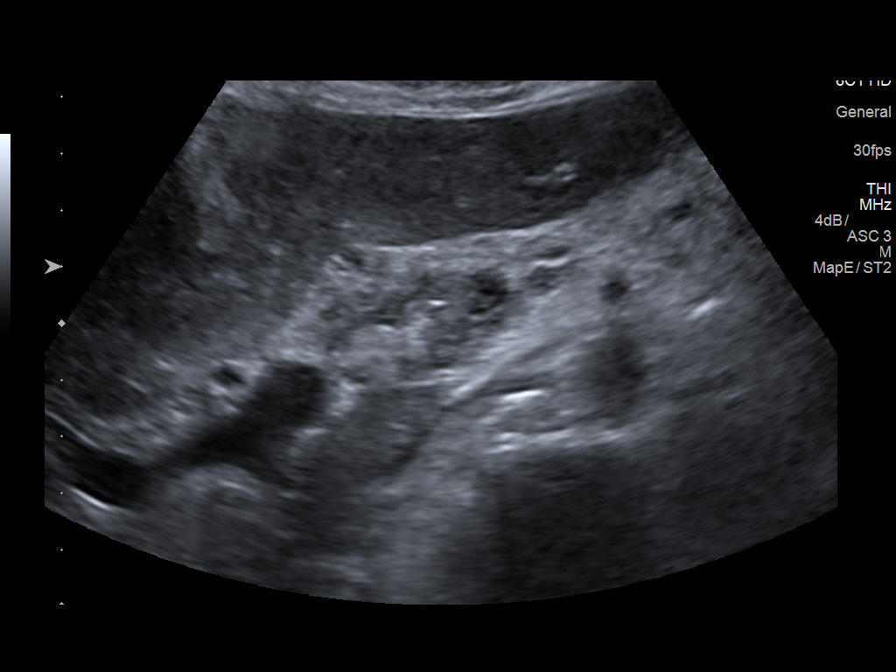
[im 4/8]
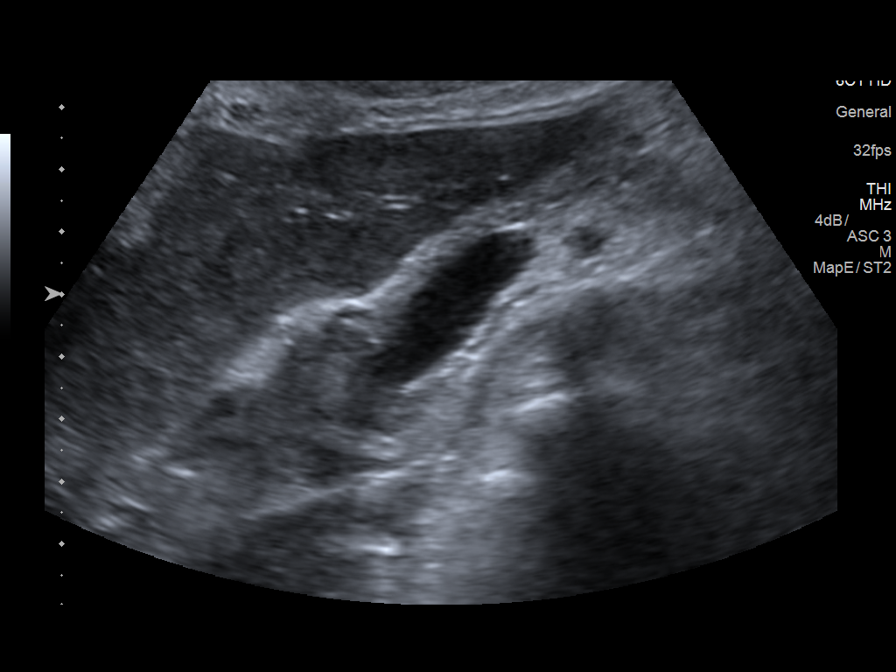
[im 5/8]
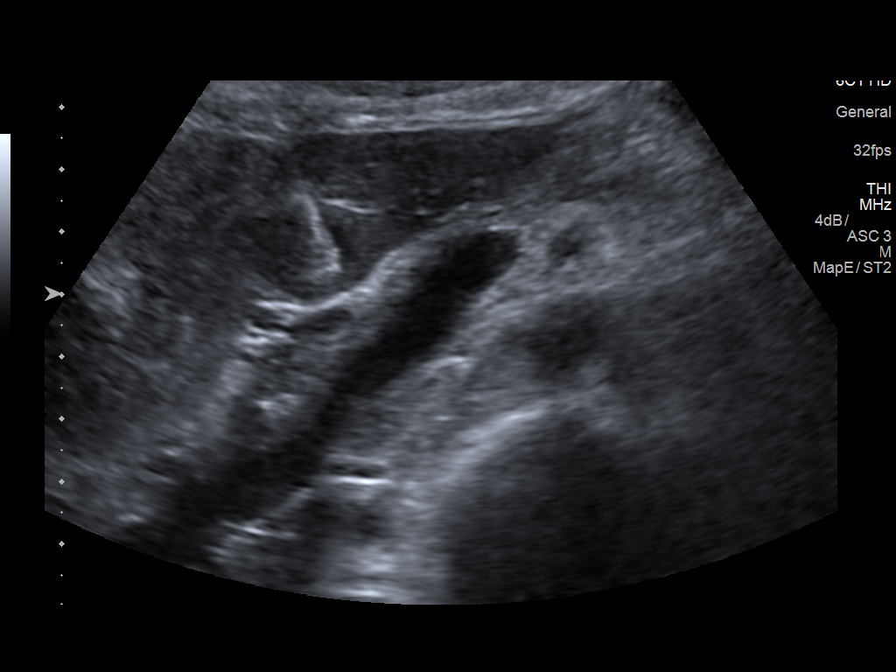
[im 6/8]
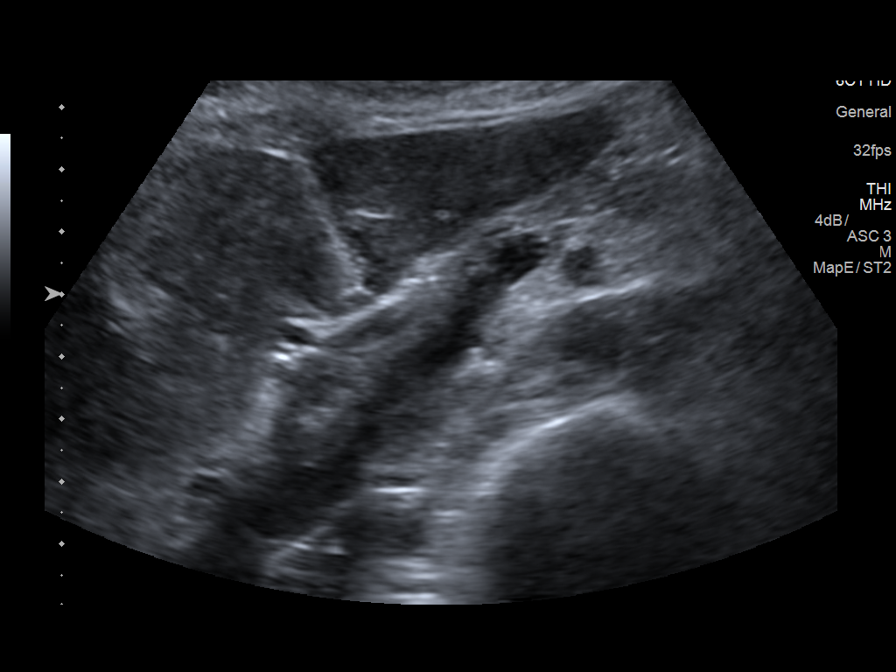
[im 7/8]
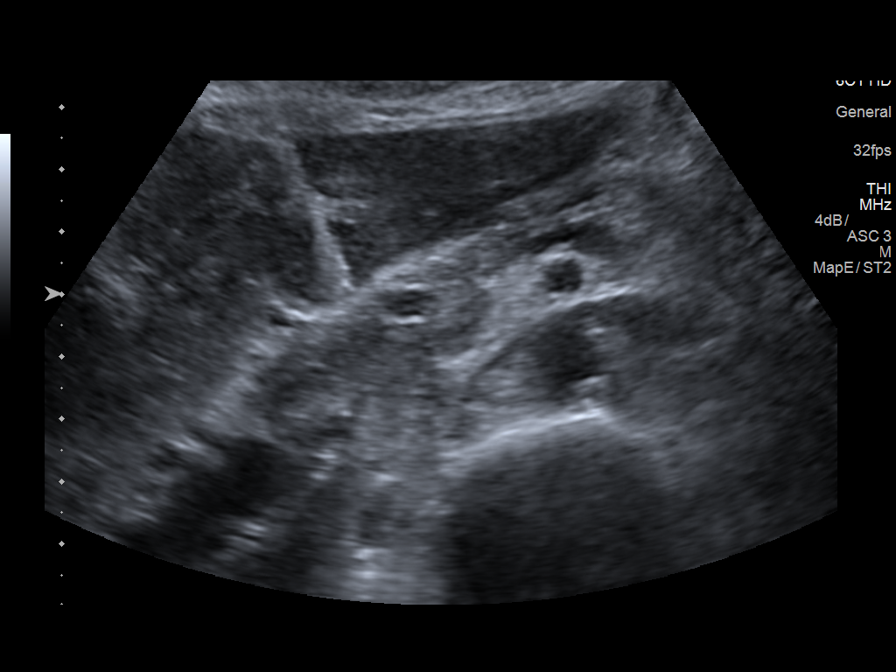
[im 8/8]
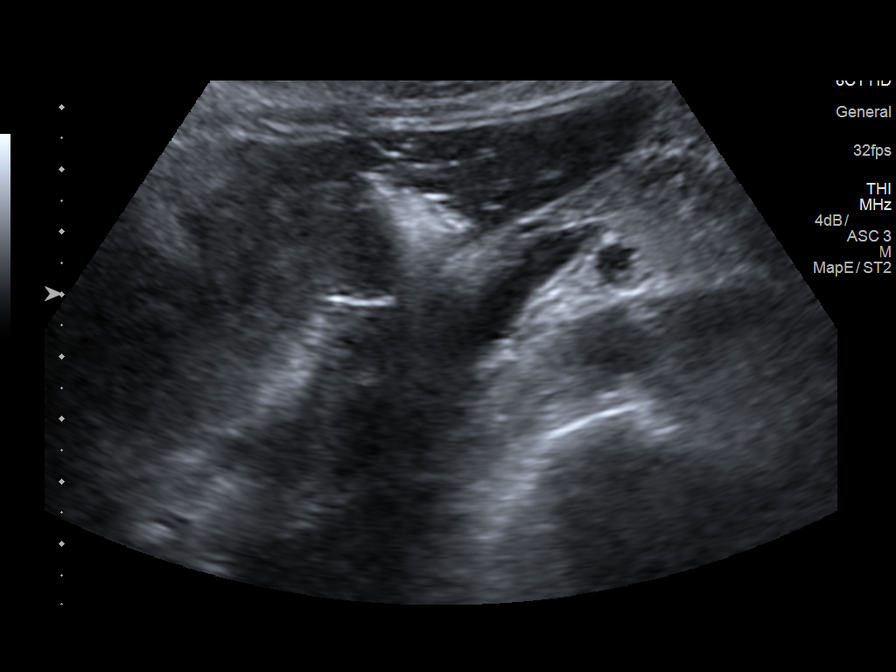

[8 of 8 positions shown; findings below may reference images not displayed]

EXAM:
ULTRASOUND BIOPSY CORE LIVER

MEDICATIONS:
1% LIDOCAINE

ANESTHESIA/SEDATION:
Moderate (conscious) sedation was employed during this procedure. A
total of Versed 1.0 mg and Fentanyl 50 mcg was administered
intravenously.

Moderate Sedation Time: 10 minutes. The patient's level of
consciousness and vital signs were monitored continuously by
radiology nursing throughout the procedure under my direct
supervision.

FLUOROSCOPY TIME:  Fluoroscopy Time: NONE.

COMPLICATIONS:
None immediate.

PROCEDURE:
Informed written consent was obtained from the patient after a
thorough discussion of the procedural risks, benefits and
alternatives. All questions were addressed. Maximal Sterile Barrier
Technique was utilized including caps, mask, sterile gowns, sterile
gloves, sterile drape, hand hygiene and skin antiseptic. A timeout
was performed prior to the initiation of the procedure.

Previous imaging reviewed. Preliminary ultrasound performed. A solid
left hepatic lobe lesion was localized from a subxiphoid window.
This was correlated with the abdominal MRI.

Overlying skin was marked. Under sterile conditions and local
anesthesia, a 17 gauge 6.8 cm access needle was advanced from an
anterior subxiphoid approach into the left hepatic mass. Needle
position confirmed with ultrasound. Three 1 cm 18 gauge core
biopsies obtained. Samples placed in formalin. These were intact and
non fragmented. Needle tract embolized with Gel-Foam. Postprocedure
imaging demonstrates no hemorrhage or hematoma. Patient tolerated
the biopsy well.
IMPRESSION: Successful ultrasound left hepatic mass 18 gauge core biopsy

## 2017-06-11 ENCOUNTER — Telehealth: Payer: Self-pay | Admitting: Internal Medicine

## 2017-06-11 NOTE — Telephone Encounter (Signed)
Jori Moll dropped off forms to be filled out. Placed in rx tower.

## 2017-06-12 NOTE — Telephone Encounter (Signed)
Spouse Jori Moll) called checking on ins form [employer] number [phone]

## 2017-06-12 NOTE — Telephone Encounter (Signed)
I put in what little I could add This is not really a physician form

## 2017-06-12 NOTE — Telephone Encounter (Signed)
Katharina Caper the forms were up front

## 2017-06-12 NOTE — Telephone Encounter (Signed)
I have looked the forms over and do not see where Dr Silvio Pate has to actually sign them. I will give them to him to put in the conditions he treated her for while she was a patient. Forms in his Inbox on his desk. I am assuming since there are no required signatures, we could add the treating hospitals she went to recently for him.

## 2017-07-11 ENCOUNTER — Ambulatory Visit: Payer: Medicare Other | Admitting: Internal Medicine

## 2018-12-03 IMAGING — DX DG CHEST 2V
2 series · 2 of 2 positions shown · non-contrast
Comparison: August 25, 2015

CLINICAL DATA: Liver metastases.  Tobacco use.

EXAM:
CHEST  2 VIEW

[chest lat]
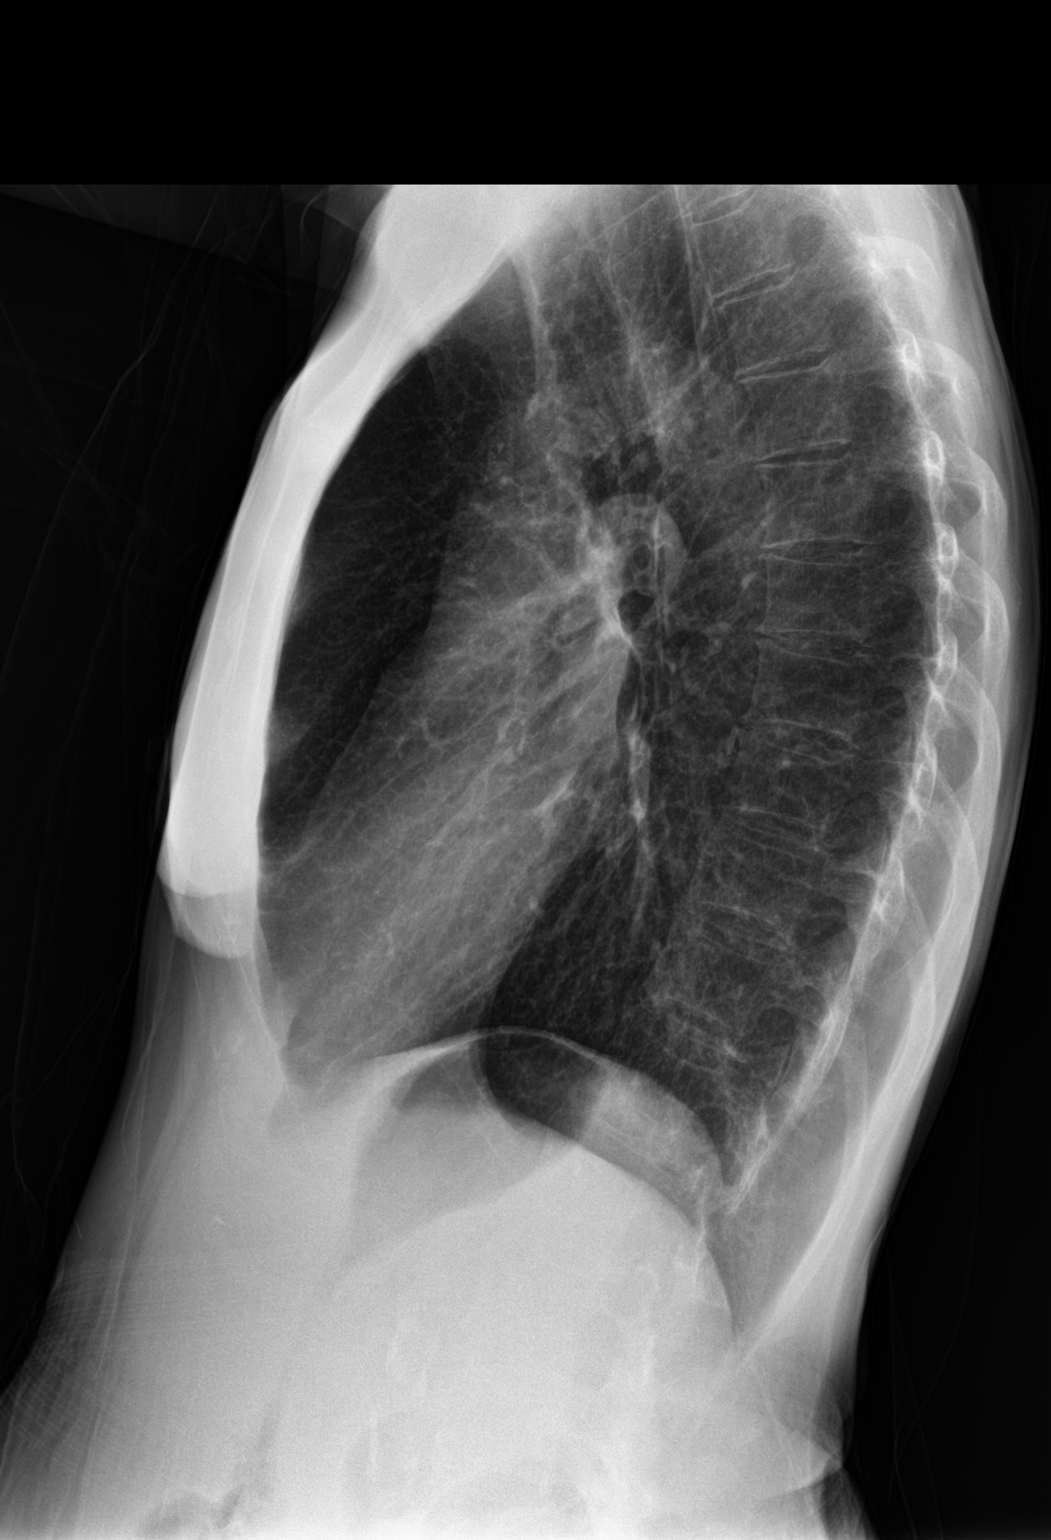

[chest pa]
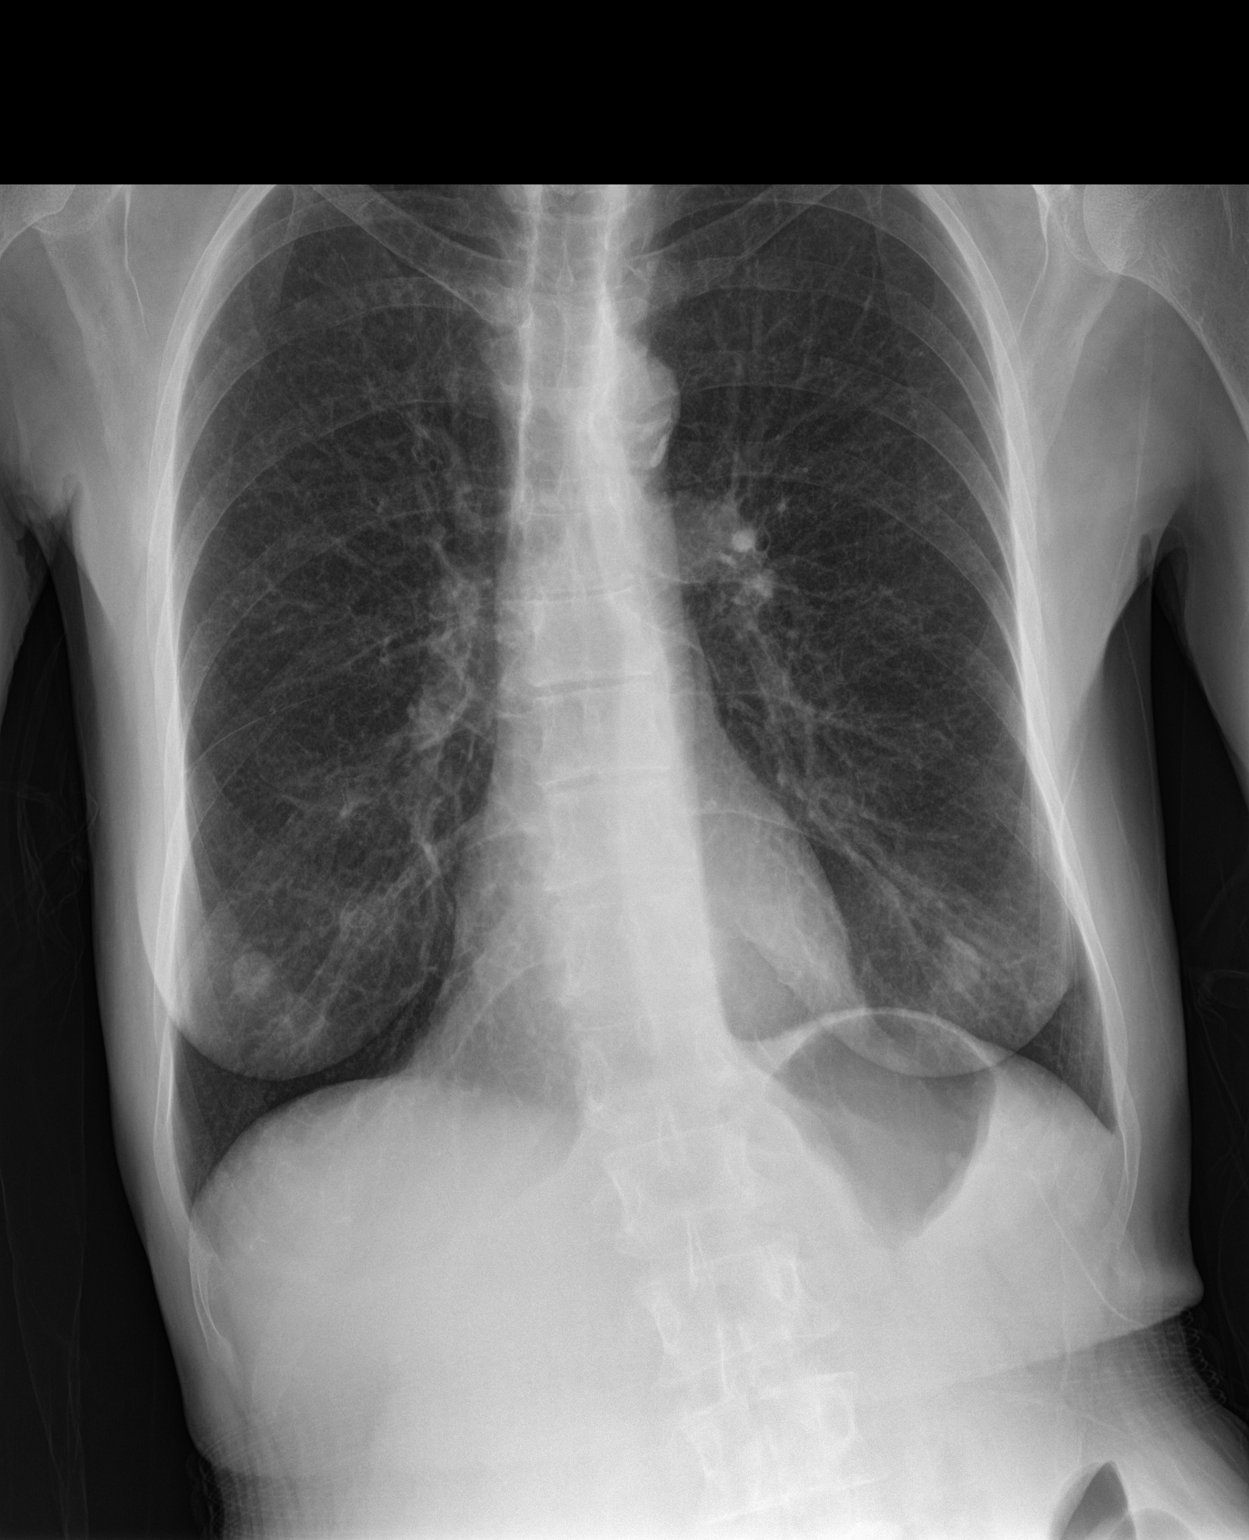

[2 of 2 positions shown; findings below may reference images not displayed]

FINDINGS: There are nipple shadows bilaterally. There is no evident edema or
consolidation. No pulmonary nodular lesion is demonstrable by
radiography. Heart size and pulmonary vascularity are normal. No
adenopathy. There is atherosclerotic calcification in the aorta. No
blastic or lytic bone lesions.
IMPRESSION: Aortic atherosclerosis. No edema or consolidation. No evident mass
or adenopathy.

## 2018-12-05 IMAGING — DX DG ABD PORTABLE 1V
1 series · 1 of 1 positions shown · non-contrast
Comparison: None.

CLINICAL DATA: Acute onset of shortness of breath, vomiting and
epigastric abdominal pain. Recent liver biopsy. Fever. Initial
encounter.

EXAM:
PORTABLE ABDOMEN - 1 VIEW

[abdomen kub]
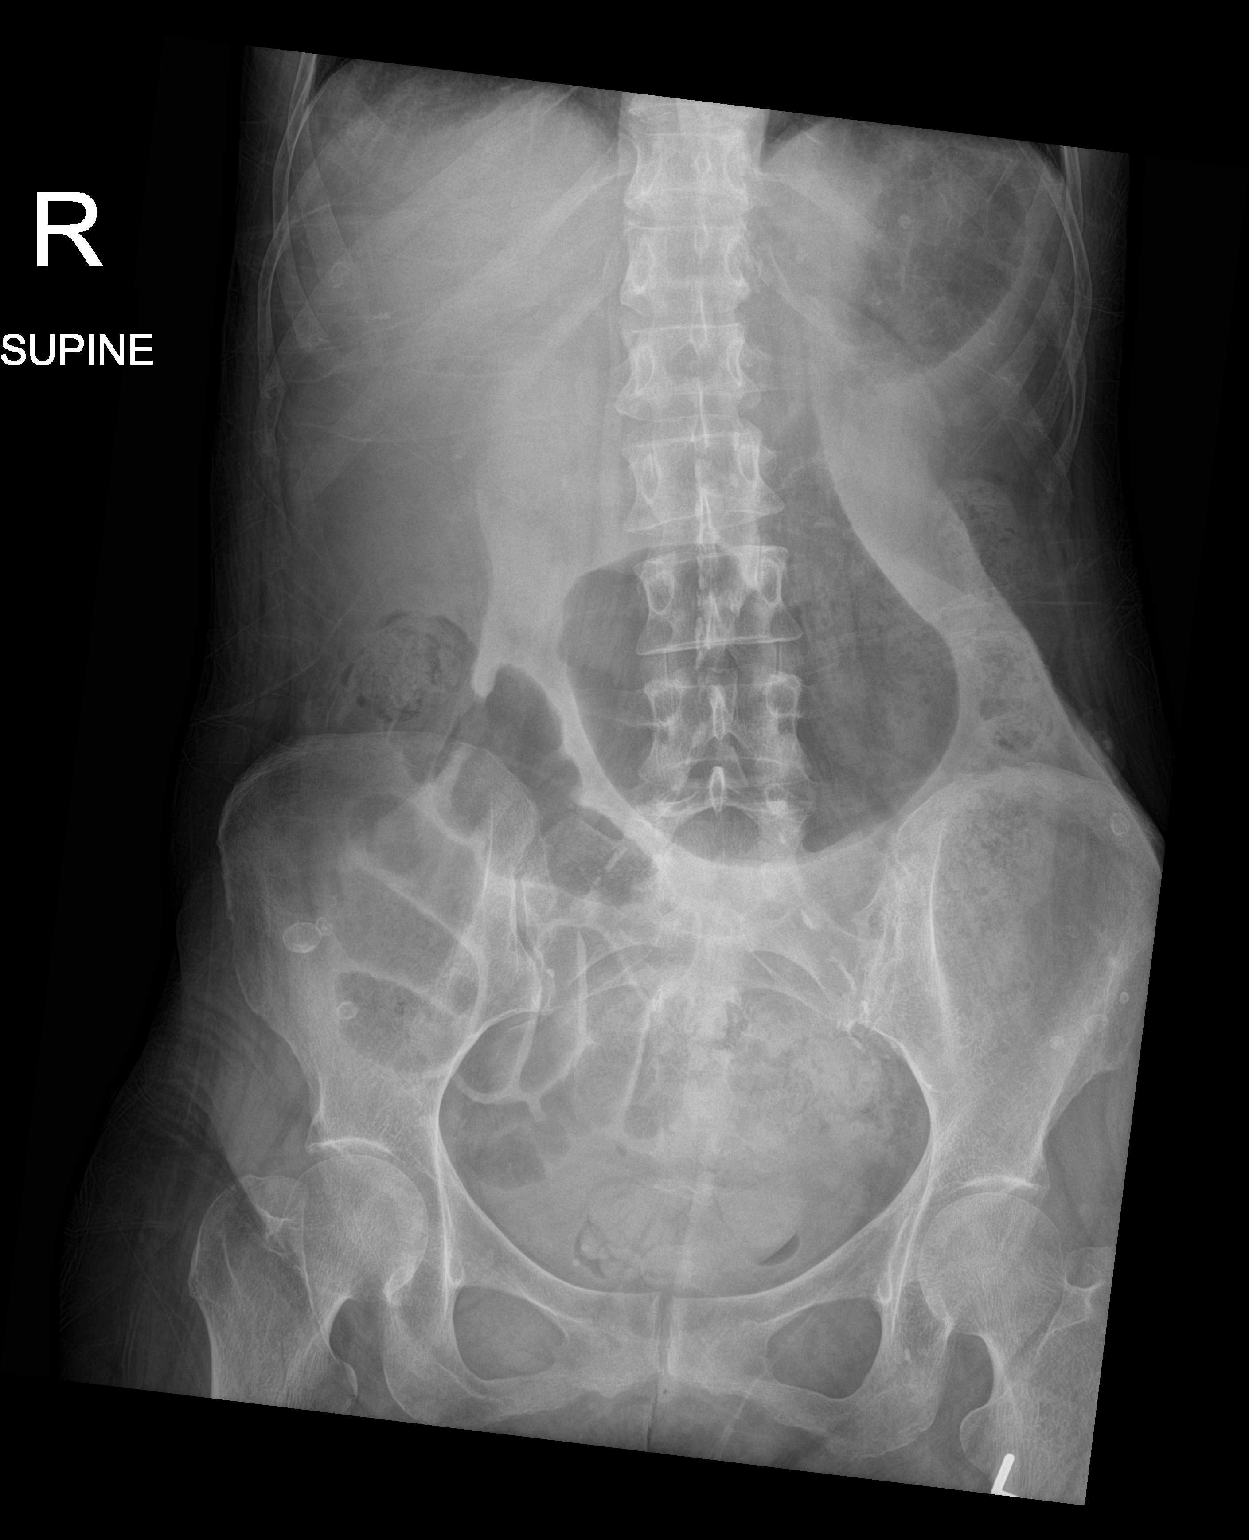

[1 of 1 positions shown; findings below may reference images not displayed]

FINDINGS: The visualized bowel gas pattern is unremarkable. Scattered air and
stool filled loops of colon are seen; no abnormal dilatation of
small bowel loops is seen to suggest small bowel obstruction. No
free intra-abdominal air is identified, though evaluation for free
air is limited on a single supine view. The stomach is largely
filled with air.

The visualized osseous structures are within normal limits; the
sacroiliac joints are unremarkable in appearance.
IMPRESSION: Unremarkable bowel gas pattern; no free intra-abdominal air seen.
Moderate amount of stool noted in the colon.

## 2018-12-24 IMAGING — US IR FLUORO GUIDE CV LINE*R*
1 series · 1 of 1 positions shown · non-contrast
Comparison: none

CLINICAL DATA: pt with new dx of metastatic breast cancer with mets
to liver

[Series 1: ir fluoro guide cv line*right* · 1 of 1 slices shown]
[im 1/1]
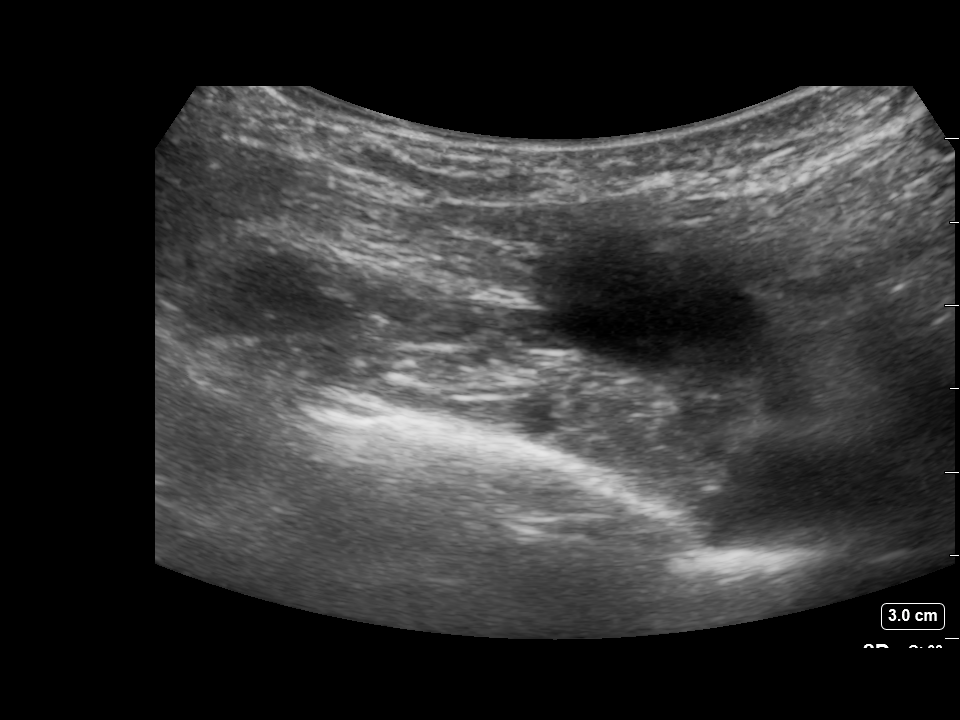

[1 of 1 positions shown; findings below may reference images not displayed]

EXAM:
TUNNEL POWER PORT PLACEMENT WITH SUBCUTANEOUS POCKET UTILIZING
ULTRASOUND & FLOUROSCOPY

FLUOROSCOPY TIME:  18 seconds.  Two mGy.

MEDICATIONS AND MEDICAL HISTORY:
Versed 2 mg, Fentanyl 75 mcg.

Additional Medications: None. Antibiotics were given within 2 hours
of the procedure.

ANESTHESIA/SEDATION:
Moderate sedation time: 32 minutes. Nursing monitored the the
patient during the procedure.

PROCEDURE:
After written informed consent was obtained, patient was placed in
the supine position on angiographic table. The right neck and chest
was prepped and draped in a sterile fashion. Lidocaine was utilized
for local anesthesia. The right jugular vein was noted to be patent
initially with ultrasound. Under sonographic guidance, a
micropuncture needle was inserted into the right IJ vein (Ultrasound
and fluoroscopic image documentation was performed). The needle was
removed over an 018 wire which was exchanged for a Amplatz. This was
advanced into the IVC. An 8-French dilator was advanced over the
Amplatz.

A small incision was made in the right upper chest over the anterior
right second rib. Utilizing blunt dissection, a subcutaneous pocket
was created in the caudal direction. The pocket was irrigated with a
copious amount of sterile normal saline. The port catheter was
tunneled from the chest incision, and out the neck incision. The
reservoir was inserted into the subcutaneous pocket and secured with
two 3-0 Ethilon stitches. A peel-away sheath was advanced over the
Amplatz wire. The port catheter was cut to measure length and
inserted through the peel-away sheath. The peel-away sheath was
removed. The chest incision was closed with 3-0 Vicryl interrupted
stitches for the subcutaneous tissue and a running of 4-0 Vicryl
subcuticular stitch for the skin. The neck incision was closed with
a 4-0 Vicryl subcuticular stitch. Derma-bond was applied to both
surgical incisions. The port reservoir was flushed and instilled
with heparinized saline. No complications.
FINDINGS: A right IJ vein Port-A-Cath is in place with its tip at the
cavoatrial junction.

COMPLICATIONS:
None
IMPRESSION: Successful 8 French right internal jugular vein power port placement
with its tip at the SVC/RA junction.
